# Patient Record
Sex: Female | Born: 1935 | Race: Black or African American | Hispanic: No
Health system: Midwestern US, Community
[De-identification: ages and names within clinical notes are randomized; demographics above are authoritative.]

## PROBLEM LIST (undated history)

## (undated) DIAGNOSIS — Z8673 Personal history of transient ischemic attack (TIA), and cerebral infarction without residual deficits: Secondary | ICD-10-CM

## (undated) DIAGNOSIS — I10 Essential (primary) hypertension: Secondary | ICD-10-CM

## (undated) DIAGNOSIS — N390 Urinary tract infection, site not specified: Secondary | ICD-10-CM

## (undated) DIAGNOSIS — E785 Hyperlipidemia, unspecified: Secondary | ICD-10-CM

## (undated) DIAGNOSIS — M549 Dorsalgia, unspecified: Secondary | ICD-10-CM

## (undated) DIAGNOSIS — E119 Type 2 diabetes mellitus without complications: Secondary | ICD-10-CM

## (undated) DIAGNOSIS — R52 Pain, unspecified: Secondary | ICD-10-CM

## (undated) DIAGNOSIS — G629 Polyneuropathy, unspecified: Secondary | ICD-10-CM

## (undated) DIAGNOSIS — K219 Gastro-esophageal reflux disease without esophagitis: Secondary | ICD-10-CM

## (undated) DIAGNOSIS — R300 Dysuria: Secondary | ICD-10-CM

## (undated) DIAGNOSIS — M48062 Spinal stenosis, lumbar region with neurogenic claudication: Secondary | ICD-10-CM

## (undated) DIAGNOSIS — I872 Venous insufficiency (chronic) (peripheral): Secondary | ICD-10-CM

## (undated) DIAGNOSIS — Z981 Arthrodesis status: Secondary | ICD-10-CM

## (undated) DIAGNOSIS — J849 Interstitial pulmonary disease, unspecified: Secondary | ICD-10-CM

## (undated) DIAGNOSIS — M4310 Spondylolisthesis, site unspecified: Secondary | ICD-10-CM

## (undated) DIAGNOSIS — D72829 Elevated white blood cell count, unspecified: Secondary | ICD-10-CM

## (undated) DIAGNOSIS — Z87440 Personal history of urinary (tract) infections: Secondary | ICD-10-CM

## (undated) DIAGNOSIS — M5104 Intervertebral disc disorders with myelopathy, thoracic region: Secondary | ICD-10-CM

## (undated) DIAGNOSIS — M7060 Trochanteric bursitis, unspecified hip: Secondary | ICD-10-CM

## (undated) DIAGNOSIS — Z9181 History of falling: Secondary | ICD-10-CM

## (undated) DIAGNOSIS — E1121 Type 2 diabetes mellitus with diabetic nephropathy: Principal | ICD-10-CM

## (undated) DIAGNOSIS — N3941 Urge incontinence: Secondary | ICD-10-CM

## (undated) DIAGNOSIS — E1140 Type 2 diabetes mellitus with diabetic neuropathy, unspecified: Principal | ICD-10-CM

## (undated) DIAGNOSIS — N3000 Acute cystitis without hematuria: Secondary | ICD-10-CM

---

## 2009-07-28 NOTE — Telephone Encounter (Signed)
Left message for daughter, Lynden Ang to call back.

## 2009-07-28 NOTE — Telephone Encounter (Signed)
Message copied by Miki Kins on Wed Jul 28, 2009  9:25 AM  ------       Message from: Hyman Hopes       Created: Tue Jul 27, 2009  6:38 PM       Regarding: Ariatna Jester       Wk [phone] til 4:30 p.m. Or [phone]       Caller made appt for this pt w/you on 08/09/09.  This is a new patient appt and only wants a well woman gyn exam w/chol labs.  She is saying the patient knows what she wants, so why should she have to wait.              If you will not do this physical for this new patient on this appt date, call to let her know (physicals are normally not done on new patient appts as those are to establish primary care.)

## 2009-07-29 NOTE — Telephone Encounter (Signed)
Pt appt was changed from 08/09/09 to 08/19/09, to give her more time to have CPE w/pap, along w/getting established as new pt.

## 2009-08-11 NOTE — Telephone Encounter (Addendum)
Agree with recommendation

## 2009-08-19 NOTE — Progress Notes (Signed)
Subjective:     Chelsea Schultz is a 75 y.o. female who presents for follow up of diabetes, hypertension and peripheral vascular disease.    Diet and Lifestyle: not attempting to follow a low fat, low cholesterol diet, not attempting to follow a low sodium diet, does not rigorously follow a diabetic diet, sedentary  Home BP Monitoring: is not measured at home    Cardiovascular ROS: taking medications as instructed, no medication side effects noted, no TIA's, no chest pain on exertion, no dyspnea on exertion, no swelling of ankles.     Diabetes Mellitus:  She has diabetes mellitus, and  hypertension.  Diabetic ROS - medication compliance: compliant all of the time, home glucose monitoring: is performed regularly, last eye exam approximately 6 months ago positive for cataracts bilaterally.       Back Pain    Chelsea Schultz is a 75 y.o. female who also complains of low back pain for several month(s), is positional with bending or lifting, with radiation down the legs.  Severity of pain is 10 out of 10.  numbness, tingling, weakness is present in bilateral  leg foot.  Precipitating factors: none recalled by the patient. Prior history of back problems: previous osteoarthritis of lumbar spine.  Self treatment:  medication not used .    The patient denies fevers, chills or sweats.  The patient denies bowel/bladder incontinence and saddle numbness.        Problem List Date Reviewed: 08/19/2009      Class    HTN (hypertension) [401.9AF]               Past Medical History   Diagnosis Date   ??? Diabetes    ??? Hypertension    ??? Neuropathy           Component Value Date/Time   WBC 9.3 08/19/2009 11:49 AM   HGB 13.0 08/19/2009 11:49 AM   HCT 41.3 08/19/2009 11:49 AM   PLATELET 255 08/19/2009 11:49 AM   MCV 87.3 08/19/2009 11:49 AM         Component Value Date/Time   Creatinine 0.9 08/19/2009 11:49 AM        Component Value Date/Time   ALT 22 08/19/2009 11:49 AM   AST 22 08/19/2009 11:49 AM    Alk. phosphatase 75 08/19/2009 11:49 AM   Bilirubin, total 0.5 08/19/2009 11:49 AM          Component Value Date/Time   Creatinine 0.9 08/19/2009 11:49 AM   BUN 23 08/19/2009 11:49 AM   Sodium 141 08/19/2009 11:49 AM   Potassium 4.3 08/19/2009 11:49 AM   Chloride 103 08/19/2009 11:49 AM   CO2 27 08/19/2009 11:49 AM            Review of Systems, additional:  A comprehensive review of systems was negative except for that written in the HPI.    Objective:     BP 176/79   Pulse 74   Temp(Src) 98.1 ??F (36.7 ??C) (Oral)   Resp 20   Ht 4' 7.75" (1.416 m)   Wt 120 lb 9.6 oz (54.704 kg)   LMP 10/23/1978  Appearance: alert, well appearing, and in no distress.  General exam: CVS exam BP noted to be moderately elevated today in office, S1, S2 normal, no gallop, no murmur, chest clear, no JVD, no HSM, no edema, diabetic exam heart sounds normal rate, regular rhythm, normal S1, S2, no murmurs, rubs, clicks or gallops.  Lab review: orders written for new  lab studies as appropriate; see orders.     Assessment/Plan:     diabetes stable, hypertension needs further observation, needs improvement, hyperlipidemia control uncertain, peripheral vascular disease needs improvement.  current treatment plan is effective, no change in therapy  repeat labs ordered prior to next appointment  reviewed diet, exercise and weight control  recommended sodium restriction  reviewed medications and side effects in detail  specific diabetic recommendations: diabetic diet discussed in detail, written exchange diet given, home glucose monitoring emphasized, foot care discussed and Podiatry visits discussed, annual eye examinations at Ophthalmology discussed and glycohemoglobin and other lab monitoring discussed.     1. HTN (hypertension) (401.9AF)  amlodipine-valsartan (EXFORGE) 10-320 mg per tablet, potassium chloride (K-DUR, KLOR-CON) 20 mEq tablet, CBC WITH AUTOMATED DIFF, METABOLIC PANEL, COMPREHENSIVE    2. Diabetes (250.00Q)  glipiZIDE (GLUCOTROL) 5 mg tablet   3. Back pain (724.5E)  VITAMIN D, 25 HYDROXY, XR DEXA W VERTEBRAL ASSESSMENT, XR SPINE LUMBAR 2 OR 3 VIEWS  Patient to take tylenol prn for pain, until renal function is assessed.  May try NSAID if normal.  Stretching exercises, rest, ice to area for now.  RTC in 3 weeks for follow up.    4. Need for lipid screening (V77.45F)  LIPID PANEL

## 2009-08-19 NOTE — Progress Notes (Signed)
Pt here for well woman exam, refill meds, & fasting labs.

## 2009-08-19 NOTE — Patient Instructions (Signed)
English   Spanish  Learning About Pap Tests  What is a Pap test?  The Pap test (also called a Pap smear) is a screening test for cancer of the cervix, which is the lower part of the uterus that opens into the vagina. The test can help your doctor find early changes in the cells that could lead to cancer.  During the test, the doctor or nurse uses a tool with smooth, curved blades (speculum) to spread the sides of your vagina. This allows your doctor to see inside the vagina and the cervix. He or she uses a cotton swab or brush to collect cell samples from your cervix.  Try to schedule the test when you're not having your period. To get ready for a Pap test, avoid douches, tampons, vaginal medicines, sprays, or powders for at least a day before you have the test.  When should you have a Pap test?  Some experts recommend that you start having Pap tests within 3 years after you become sexually active. Others say that everyone should start getting tested at age 26. Talk with your doctor about when to start having Pap tests.  ?? Women younger than 30 should have Pap tests every 1 to 2 years, depending on what they and their doctors decide.   ?? Women 30 and older who have had three normal Pap tests in a row may only need a Pap test every 3 years. Women can also have an HPV (human papillomavirus) test with their Pap tests. If the Pap and HPV tests are negative, women can have their Pap tests every 3 years. But women who are at a higher risk for cervical cell changes or cervical cancer may still need the tests more often, even if the results are normal.    ?? Women 15 and older who have had three normal Pap tests in a row and no abnormal Pap tests in the past 10 years may decide to stop having this test. When to stop having Pap tests is a personal choice and may depend on your medical history, your overall health, and your risk for cervical cell changes or cervical cancer. Talk with your doctor about whether you should stop or continue to have Pap tests. He or she can help you decide.  Having the HPV vaccine does not change your need for Pap tests. Women who have had the HPV vaccine should follow the same Pap test schedule as women who have not had the vaccine.  What happens after the test?  The sample of cells taken during your test will be sent to a lab so that an expert can look at the cells. It usually takes a week or two to get the results back.  ?? A normal result means that the test did not find any abnormal cells in the sample.   ?? An abnormal result can mean many things. Most of these are not cancer. The results of your test may be abnormal because:   ?? You have an infection of the vagina or cervix, such as a yeast infection.   ?? You have an IUD (intrauterine device for birth control).   ?? You have low estrogen levels after menopause that are causing the cells to change.   ?? You have cell changes that may be a sign of precancer or cancer. The results are ranked based on how serious the changes might be.     Where can you learn more?   Go to  [facility].  Enter P919 in the search box to learn more about "Learning About Pap Tests."    ?? [employer]. Care instructions adapted under license by [employer] (which disclaims liability or warranty for this information). This care instruction is for use with your licensed healthcare professional. If you have questions about a medical condition or this instruction, always ask your healthcare professional. Healthwise disclaims any warranty or liability for your use of this information.English   Spanish  Dual Energy X-Ray Absorptiometry (DEXA) Test: About This Test  What is it?  Dual Energy X-ray Absorptiometry (DEXA) is a test that uses two different X-ray beams to check bone thickness (density) in your spine and hip. This information is used to estimate the strength of your bones.  Why is this test done?  A DEXA test is done to check for bone thinning and weakness, which can make it easier for you to break a bone. It is often done for:  ?? People who are at risk for osteoporosis:   ?? Women over age 59.   ?? People who take some medications, such as corticosteroids.   ?? People who have certain medical conditions, such as hyperparathyroidism.  ?? People who have osteoporosis, to see how well treatment is working.  What happens before the test?  ?? Women who are pregnant should not have this test. Let your doctor know if you are or might be pregnant.   ?? You may be able to leave your clothes on for the test, but you will remove any metal buttons or buckles for the test.  What happens during the test?  ?? You will lie down on your back on a padded table.   ?? You may need to lie with your legs straight or with your lower legs resting on a platform built into the table.   ?? The machine will scan your bones and measure the amount of radiation they absorb. During this test you are exposed to a very low dose of radiation.  How long does the test take?  ?? The test will take about 20 minutes.  What happens after the test?  ?? You will probably be able to go home right away.    ?? You can go back to your usual activities right away.     Where can you learn more?   Go to [facility].  Enter [phone] in the search box to learn more about "Dual Energy X-Ray Absorptiometry (DEXA) Test: About This Test."   ?? 2006-2010 [employer]wise, Incorporated. Care instructions adapted under license by [employer] (which disclaims liability or warranty for this information). This care instruction is for use with your licensed healthcare professional. If you have questions about a medical condition or this instruction, always ask your healthcare professional. Healthwise disclaims any warranty or liability for your use of this information.English   Spanish  High Blood Pressure: After Your Visit  Your Care Instructions  If your blood pressure is usually above 140/90, you have high blood pressure, or hypertension. Despite what a lot of people think, high blood pressure usually does not cause headaches or make you feel dizzy or lightheaded. It usually has no symptoms. But it does increase your risk for heart attack, stroke, and kidney or eye damage. The higher your blood pressure, the more your risk increases.  Changes in your lifestyle, such as staying at a healthy weight, may help you lower your blood pressure. Your treatment also will include medicine. If you stop taking  your medicine, your blood pressure will go back up.  Follow-up care is a key part of your treatment and safety. Be sure to make and go to all appointments, and call your doctor if you are having problems. It???s also a good idea to know your test results and keep a list of the medicines you take.  How can you care for yourself at home?  Medical treatment   ?? Take your medicine exactly as prescribed. You may take one or more types of medicine to lower your blood pressure. They include diuretics, beta-blockers, ACE inhibitors, calcium channel blockers, angiotensin II receptor blockers, and other medicines. Call your doctor if you think you are having a problem with your medicine.   ?? Your doctor may suggest that you take one low-dose aspirin (81 mg) a day. This can help reduce your risk of having a stroke or heart attack.   ?? See your doctor at least 2 times a year. You may need to see the doctor more often at first or until your blood pressure comes down.   ?? If you are taking blood pressure medicine, talk to your doctor before you take decongestants or anti-inflammatory medicine, such as ibuprofen. Some of these medicines can raise blood pressure.   ?? Learn how to check your blood pressure at home.  Lifestyle changes  ?? Stay at a healthy weight. This is especially important if you put on weight around the waist. Losing even 10 pounds can help you lower your blood pressure.   ?? If your doctor recommends it, get more exercise. Walking is a good choice. Bit by bit, increase the amount you walk every day. Try for at least 30 minutes on most days of the week. You also may want to swim, bike, or do other activities.   ?? Avoid or limit alcohol. Talk to your doctor about whether you can drink any alcohol.   ?? Limit salt.   ?? Eat plenty of fruits (such as bananas and oranges), vegetables, legumes, whole grains, and low-fat dairy products.   ?? Lower the amount of saturated fat in your diet. Saturated fat is found in animal products such as milk, cheese, and meat. Limiting these foods may help you lose weight and also lower your risk for heart disease.   ?? Do not smoke. Smoking increases your risk for heart attack and stroke. If you need help quitting, talk to your doctor about stop-smoking programs and medicines. These can increase your chances of quitting for good.   When should you call for help?  Call 911 anytime you think you may need emergency care. For example, call if:  ?? You have chest pain or pressure. This may occur with:   1. Sweating.   2. Shortness of breath.   3. Nausea or vomiting.   4. Pain that spreads from the chest to the neck, jaw, or one or both shoulders or arms.   5. Dizziness or lightheadedness.   6. A fast or uneven pulse.  After calling 911, chew 1 adult-strength aspirin. Wait for an ambulance. Do not try to drive yourself.   ?? You have signs of a stroke. These include:   1. Sudden numbness, paralysis, or weakness in your face, arm, or leg, especially on only one side of your body.   2. New problems with walking or balance.   3. Sudden vision changes.   4. New problems speaking or understanding simple statements, or feeling confused.   5. A sudden,  severe headache that is different from past headaches.  Call your doctor now or seek immediate medical care if:  ?? Your blood pressure rises suddenly.   ?? Your blood pressure is 180/110 or higher.   ?? You are dizzy or lightheaded, or you feel like you may faint.   ?? Your blood pressure is higher than 140/90 on two or more occasions.  Watch closely for changes in your health, and be sure to contact your doctor if:  ?? You do not get better as expected.     Where can you learn more?   Go to [facility].  Enter [phone] in the search box to learn more about "High Blood Pressure: After Your Visit."   ?? 2006-2010 [employer]wise, [employer]. Care instructions adapted under license by [employer] (which disclaims liability or warranty for this information). This care instruction is for use with your licensed healthcare professional. If you have questions about a medical condition or this instruction, always ask your healthcare professional. Healthwise disclaims any warranty or liability for your use of this information.English   Spanish  The DASH Diet: After Your Visit  Your Care Instructions   The DASH diet is an eating plan that can help lower your blood pressure. DASH stands for Dietary Approaches to Stop Hypertension. Hypertension is high blood pressure.  The DASH diet focuses on eating foods that are high in calcium, potassium, and magnesium. These nutrients can lower blood pressure. The foods that are highest in these nutrients are fruits, vegetables, low-fat dairy products, nuts, seeds, and legumes. But taking calcium, potassium, and magnesium supplements instead of eating foods that are high in those nutrients does not have the same effect. The DASH diet also includes whole grains, fish, and poultry.  The DASH diet is one of several lifestyle changes your doctor may recommend to lower your high blood pressure. Your doctor may also want you to decrease the amount of sodium in your diet. Lowering sodium while following the DASH diet can lower blood pressure even further than just the DASH diet alone.  Follow-up care is a key part of your treatment and safety. Be sure to make and go to all appointments, and call your doctor if you are having problems. It???s also a good idea to know your test results and keep a list of the medicines you take.  How can you care for yourself at home?  Following the DASH diet  ?? Eat 4 to 5 servings of fruit each day. A serving is 1 medium-sized piece of fruit, 1/2 cup chopped or canned fruit, 1/4 cup dried fruit, or 4 ounces (1/2 cup) of fruit juice. Choose fruit more often than fruit juice.   ?? Eat 4 to 5 servings of vegetables each day. A serving is 1 cup of lettuce or raw leafy vegetables, 1/2 cup of chopped or cooked vegetables, or 4 ounces (1/2 cup) of vegetable juice. Choose vegetables more often than vegetable juice.   ?? Get 3 servings of low-fat dairy each day. A serving is 8 ounces of milk, 1 cup of yogurt, or 1 1/2 ounces of cheese.    ?? Eat 7 to 8 servings of grains each day. A serving is 1 slice of bread, 1 ounce of dry cereal, or 1/2 cup of cooked rice, pasta, or cooked cereal. Try to choose whole-grain products as much as possible.   ?? Limit lean meat, poultry, and fish to 6 ounces each day. Six ounces is about the size of two decks  of cards.   ?? Eat 4 to 5 servings of nuts, seeds, and legumes (cooked dried beans, lentils, and split peas) each week. A serving is 1/3 cup of nuts, 2 tablespoons of seeds, or 1/2 cup cooked dried beans or peas.  Tips for success  ?? Start small. Do not try to make dramatic changes to your diet all at once. You might feel that you are missing out on your favorite foods and then be more likely to not follow the plan. Make small changes, and stick with them. Once those changes become habit, add a few more changes.   ?? Try some of the following:   ?? Make it a goal to eat a fruit or vegetable at every meal and at snacks. This will make it easy to get the recommended amount of fruits and vegetables each day.   ?? Try yogurt topped with fruit and nuts for a snack or healthy dessert.   ?? Add lettuce, tomato, cucumber, and onion to sandwiches.   ?? Combine a ready-made pizza crust with low-fat mozzarella cheese and lots of vegetable toppings. Try using tomatoes, squash, spinach, broccoli, carrots, cauliflower, and onions.   ?? Have a variety of cut-up vegetables with a low-fat dip as an appetizer instead of chips and dip.   ?? Sprinkle sunflower seeds or chopped almonds over salads. Or try adding chopped walnuts or almonds to cooked vegetables.   ?? Try some vegetarian meals using beans and peas. Add garbanzo or kidney beans to salads. Make burritos and tacos with mashed pinto beans or black beans.     Where can you learn more?   Go to [employer].  Enter [phone] in the search box to learn more about "The DASH Diet: After Your Visit."    ?? 2006-2010 Healthwise, [employer]. Care instructions adapted under license by [employer] (which disclaims liability or warranty for this information). This care instruction is for use with your licensed healthcare professional. If you have questions about a medical condition or this instruction, always ask your healthcare professional. Healthwise disclaims any warranty or liability for your use of this information.

## 2009-08-20 ENCOUNTER — Telehealth

## 2009-08-20 LAB — METABOLIC PANEL, COMPREHENSIVE
A-G Ratio: 1.2 (ref 1.1–2.2)
ALT (SGPT): 22 U/L (ref 12–78)
AST (SGOT): 22 U/L (ref 15–37)
Albumin: 4 g/dL (ref 3.5–5.0)
Alk. phosphatase: 75 U/L (ref 50–136)
Anion gap: 11 mmol/L (ref 5–15)
BUN/Creatinine ratio: 26 — ABNORMAL HIGH (ref 12–20)
BUN: 23 MG/DL — ABNORMAL HIGH (ref 6–20)
Bilirubin, total: 0.5 MG/DL (ref 0.2–1.0)
CO2: 27 MMOL/L (ref 21–32)
Calcium: 9.7 MG/DL (ref 8.5–10.1)
Chloride: 103 MMOL/L (ref 97–108)
Creatinine: 0.9 MG/DL (ref 0.6–1.3)
GFR est AA: >60 mL/min/{1.73_m2} (ref >60)
GFR est non-AA: >60 mL/min/{1.73_m2} (ref >60)
Globulin: 3.4 g/dL (ref 2.0–4.0)
Glucose: 87 MG/DL (ref 65–100)
Potassium: 4.3 MMOL/L (ref 3.5–5.1)
Protein, total: 7.4 g/dL (ref 6.4–8.2)
Sodium: 141 MMOL/L (ref 136–145)

## 2009-08-20 LAB — CBC WITH AUTOMATED DIFF
ABS. BASOPHILS: 0 10*3/uL (ref 0.0–0.1)
ABS. EOSINOPHILS: 0.2 10*3/uL (ref 0.0–0.4)
ABS. LYMPHOCYTES: 2.5 10*3/uL (ref 0.8–3.5)
ABS. MONOCYTES: 0.9 10*3/uL (ref 0.0–1.0)
ABS. NEUTROPHILS: 5.6 10*3/uL (ref 1.8–8.0)
BASOPHILS: 0 % (ref 0–1)
EOSINOPHILS: 2 % (ref 0–7)
HCT: 41.3 % (ref 35.0–47.0)
HGB: 13 g/dL (ref 11.5–16.0)
LYMPHOCYTES: 27 % (ref 12–49)
MCH: 27.5 PG (ref 26.0–34.0)
MCHC: 31.5 g/dL (ref 30.0–36.5)
MCV: 87.3 FL (ref 80.0–99.0)
MONOCYTES: 10 % (ref 5–13)
NEUTROPHILS: 61 % (ref 32–75)
PLATELET: 255 10*3/uL (ref 150–400)
RBC: 4.73 M/uL (ref 3.80–5.20)
RDW: 14.2 % (ref 11.5–14.5)
WBC: 9.3 10*3/uL (ref 3.6–11.0)

## 2009-08-20 NOTE — Telephone Encounter (Signed)
Message copied by Miki Kins on Fri Aug 20, 2009  1:48 PM  ------       Message from: Anner Crete A       Created: Fri Aug 20, 2009  9:59 AM       Regarding: Penha call pt       Contact: [phone]         Pt's daughter is calling with information you requested.  Daughter can be reached at  [phone] Ext  [phone].   She is calling with the following information:              Furosemide 40mg  2x day.              Need name of vitamin that would help pt with her neuropathy.                 Please call in all prescription and the Furosemide also.

## 2009-08-21 LAB — VITAMIN D, 25 HYDROXY: Vitamin D 25-Hydroxy: 36 ng/mL (ref 30–80)

## 2009-08-24 NOTE — Progress Notes (Addendum)
I reviewed with the resident the medical history and the resident's findings on the physical examination.  I discussed with the resident the patient's diagnosis and concur with the plan.

## 2009-08-25 ENCOUNTER — Encounter

## 2009-08-25 MED ORDER — GLIPIZIDE 5 MG TAB
5 mg | ORAL_TABLET | Freq: Two times a day (BID) | ORAL | Status: DC
Start: 2009-08-25 — End: 2009-10-25

## 2009-08-25 MED ORDER — POTASSIUM CHLORIDE SR 20 MEQ TAB, PARTICLES/CRYSTALS
20 mEq | ORAL_TABLET | Freq: Every day | ORAL | Status: DC
Start: 2009-08-25 — End: 2010-09-21

## 2009-08-25 MED ORDER — FUROSEMIDE 40 MG TAB
40 mg | ORAL_TABLET | Freq: Two times a day (BID) | ORAL | Status: AC
Start: 2009-08-25 — End: 2010-08-20

## 2009-08-25 MED ORDER — ALPHA LIPOIC ACID 600 MG CAPSULE
600 mg | ORAL_TABLET | Freq: Every day | ORAL | Status: DC
Start: 2009-08-25 — End: 2010-07-01

## 2009-08-25 MED ORDER — AMLODIPINE-VALSARTAN 10 MG-320 MG TAB
10-320 mg | ORAL_TABLET | Freq: Every day | ORAL | Status: DC
Start: 2009-08-25 — End: 2010-09-21

## 2009-08-25 NOTE — Telephone Encounter (Signed)
meds were electronically ordered on day of visit.  Don't know why there was a problem.  Vitamin was Lipoic acid 600 mg daily.  Will send results and information regarding use with peripheral neuropathy.

## 2009-08-25 NOTE — Progress Notes (Signed)
Quick Note:    Called in to give results and to discuss medications. No answer at 1:35pm on 08/25/09. Information and results mailed to patient today. All scripts were reforwarded to [facility] pharmacy. Thank you. Please give pts daughter information if she calls back.  ______

## 2009-08-25 NOTE — Telephone Encounter (Signed)
Message copied by Miki Kins on Wed Aug 25, 2009  9:38 AM  ------       Message from: Hyman Hopes       Created: Tue Aug 24, 2009  5:25 PM       Regarding: Chelsea Schultz       Contact: [phone]         Pt was seen last week and her medications have not been filled.  Since the appt, Lynden Ang has also called with a message to you regarding the other medication the patient needed.  Also the natural o.t.c. vitamin to help with perifil neuropathy needs to be discussed.       [facility], [place],       .       Daughter, Tora Prunty, says she wants this matter of the patient medications resolved as soon as possible and is expecting a call as soon as possible.  She does not understand what you are doing and why this was not taken care of.

## 2009-09-02 MED ORDER — CALCIUM CARBONATE-VITAMIN D2 500 MG-125 UNIT TAB
500-125 mg-unit | ORAL_TABLET | Freq: Two times a day (BID) | ORAL | Status: AC
Start: 2009-09-02 — End: 2009-10-02

## 2009-09-02 MED ORDER — DICLOFENAC 3 % TOPICAL GEL
3 % | Freq: Two times a day (BID) | CUTANEOUS | Status: DC
Start: 2009-09-02 — End: 2010-07-01

## 2009-09-02 NOTE — Progress Notes (Signed)
Pt here for f/u hip pain and Bp check.

## 2009-09-02 NOTE — Patient Instructions (Signed)
English   Spanish  Back Pain: After Your Visit  Your Care Instructions    Back pain has many possible causes. It is often related to problems with muscles and ligaments of the back. It may also be related to problems with the nerves, discs, or bones of the back. Moving, lifting, standing, sitting, or sleeping in an awkward way can strain the back. Sometimes you do not notice the injury until later. Arthritis is another common cause of back pain.  Although it may hurt a lot, back pain usually improves on its own within several weeks. Most people recover in 12 weeks or less. Using good home treatment and being careful not to stress your back can help you feel better sooner.  Follow-up care is a key part of your treatment and safety. Be sure to make and go to all appointments, and call your doctor if you are having problems. It???s also a good idea to know your test results and keep a list of the medicines you take.  How can you care for yourself at home?  ?? Sit or lie in positions that are most comfortable and reduce your pain. Try one of these positions when you lie down:   ?? Lie on your back with your knees bent and supported by large pillows.   ?? Lie on the floor with your legs on the seat of a sofa or chair.   ?? Lie on your side with your knees and hips bent and a pillow between your legs.   ?? Lie on your stomach if it does not make pain worse.  ?? Do not sit up in bed, and avoid soft couches and twisted positions. Bed rest can help relieve pain at first, but it delays healing. Avoid bed rest after the first day.   ?? Change positions every 30 minutes. If you must sit for long periods of time, take breaks from sitting. Get up and walk around, or lie in a comfortable position.   ?? For the first 2 or 3 days, put ice or cold packs on your back for 10 to 20 minutes at a time, several times a day. (Put a thin cloth between the ice pack and your skin.) This reduces pain.    ?? After the first 2 or 3 days, use a warm pack or heating pad for 20 minutes at a time. Hot showers will also help. Hot baths may help as long as you can lie or sit in a position that does not stress your back. You may also keep using ice if it helps.   ?? Take pain medicines exactly as directed.   ?? If the doctor gave you a prescription medicine for pain, take it as prescribed.   ?? If you are not taking a prescription pain medicine, ask your doctor if you can take an over-the-counter medicine.   ?? Do not take two or more pain medicines at the same time unless the doctor told you to. Many pain medicines have acetaminophen, which is Tylenol. Too much acetaminophen (Tylenol) can be harmful.  ?? Take short walks several times a day. You can start with 5 to 10 minutes, 3 to 4 times a day, and work up to longer walks. Stick to level surfaces and avoid hills and stairs until your back is better.   ?? Return to work and other activities as soon as you can. Continued rest without activity is usually not good for your back.   ?? To   prevent future back pain, do exercises to stretch and strengthen your back and stomach. Learn how to use good posture, safe lifting techniques, and proper body mechanics.  When should you call for help?  Call 911 anytime you think you may need emergency care. For example, call if:  ?? You lose bladder or bowel control.   ?? You suddenly cannot walk or stand.   ?? You have sudden numbness or weakness in both legs.  Call your doctor now or seek immediate medical care if:  1. You have new pain, numbness, tingling, or weakness, especially in the buttocks, genital or rectal area, legs, or feet.   2. You have symptoms of a urinary infection. For example:   1. You have blood or pus in your urine.   2. You have pain in your back just below your rib cage. This is called flank pain.   3. You have a fever, chills, or body aches.   4. It hurts to urinate. You have groin or belly pain.   Watch closely for changes in your health, and be sure to contact your doctor if:  1. Your back pain gets worse or more frequent.   2. Your back pain is not better after 1 week of home treatment. It may take a lot longer for the pain to go away completely, but it should feel at least a little better.     Where can you learn more?   Go to [email or web address].  Enter [phone] in the search box to learn more about "Back Pain: After Your Visit."   ?? 2006-2010 [employer]. Care instructions adapted under license by [employer] (which disclaims liability or warranty for this information). This care instruction is for use with your licensed healthcare professional. If you have questions about a medical condition or this instruction, always ask your healthcare professional. [employer]wise disclaims any warranty or liability for your use of this information.English   Spanish  Back Stretches: Exercises  Your Care Instructions  Here are some examples of exercises for stretching your back. Start each exercise slowly. Ease off the exercise if you start to have pain.  Your doctor or physical therapist will tell you when you can start these exercises and which ones will work best for you.  How to do the exercises  Overhead stretch      3. Stand comfortably with your feet shoulder-width apart.   4. Looking straight ahead, raise both arms over your head and reach toward the ceiling. Do not allow your head to tilt back.   5. Hold for 15 to 30 seconds, then lower your arms to your sides.   6. Repeat 2 to 4 times.  Side stretch      3. Stand comfortably with your feet shoulder-width apart.   4. Raise one arm over your head, and then lean to the other side.   5. Slide your hand down your leg as you let the weight of your arm gently stretch your side muscles. Hold for 15 to 30 seconds.   6. Repeat 2 to 4 times on each side.  Press-up      1. Lie on your stomach, supporting your body with your forearms.    2. Press your elbows down into the floor to raise your upper back. As you do this, relax your stomach muscles and allow your back to arch without using your back muscles. As your press up, do not let your hips or pelvis come off the floor.  3. Hold for 15 to 30 seconds, then relax.   4. Repeat 2 to 4 times.  Relax and rest      1. Lie on your back with a rolled towel under your neck and a pillow under your knees. Extend your arms comfortably to your sides.   2. Relax and breathe normally.   3. Remain in this position for about 10 minutes.   4. If you can, do this 2 or 3 times each day.  Follow-up care is a key part of your treatment and safety. Be sure to make and go to all appointments, and call your doctor if you are having problems. It's also a good idea to know your test results and keep a list of the medicines you take.     Where can you learn more?   Go to [email or web address].  Enter [phone] in the search box to learn more about "Back Stretches: Exercises."   ?? [employer]. Care instructions adapted under license by [employer] (which disclaims liability or warranty for this information). This care instruction is for use with your licensed healthcare professional. If you have questions about a medical condition or this instruction, always ask your healthcare professional. [employer]wise disclaims any warranty or liability for your use of this information.

## 2009-09-05 NOTE — Progress Notes (Signed)
Subjective:    Chelsea Schultz is a 75 y.o. female who presents for follow up of hypertension.   Diet and Lifestyle: not attempting to follow a low fat, low cholesterol diet, not attempting to follow a low sodium diet, does not rigorously follow a diabetic diet, sedentary   Home BP Monitoring: is not measured at home.       Cardiovascular ROS: taking medications as instructed, no medication side effects noted, no TIA's, no chest pain on exertion, no dyspnea on exertion, no swelling of ankles.     Component Value Date/Time   Creatinine 0.9 08/19/2009 11:49 AM   BUN 23 08/19/2009 11:49 AM   Sodium 141 08/19/2009 11:49 AM   Potassium 4.3 08/19/2009 11:49 AM   Chloride 103 08/19/2009 11:49 AM   CO2 27 08/19/2009 11:49 AM          Back Pain   Chelsea Schultz is a 75 y.o. female who also complains of low back pain for several month(s), is positional with bending or lifting, with radiation down the legs. Severity of pain is 10 out of 10. numbness, tingling, weakness is present in bilateral leg foot. Precipitating factors: none recalled by the patient. Prior history of back problems: previous osteoarthritis of lumbar spine. Self treatment: medication not used .   The patient denies fevers, chills or sweats. The patient denies bowel/bladder incontinence and saddle numbness.   Pt usually takes tylenol prn for the pain.  She has difficulty lying flat on her back because of the pain.  She also has limitations in activities because of the pain.      Review of Systems, additional:   A comprehensive review of systems was negative except for that written in the HPI.     Objective:    Blood pressure 143/79, pulse 71, temperature 97.9 ??F (36.6 ??C), temperature source Oral, resp. rate 16, height 4' 7.5" (1.41 m), weight 120 lb 6.4 oz (54.613 kg), last menstrual period 10/23/1978.  Appearance: alert, well appearing, and in no distress.    General exam: CVS exam BP noted to be moderately elevated today in office, S1, S2 normal, no gallop, no murmur, chest clear, no JVD, no HSM, no edema, diabetic exam heart sounds normal rate, regular rhythm, normal S1, S2, no murmurs, rubs, clicks or gallops.     Patient appears to be in moderate pain, antalgic gait noted. Lumbosacral spine area reveals local tenderness no mass. Painful LS ROM noted. Spinal alignment is normal.  Straight leg raise is positive at 30 degrees on both sides. Lumbosacral distribution DTR's, motor strength and sensation are normal, including heel and toe gait.  Peripheral pulses are palpable and 2 plus. Prior studies inlcude: Lumbar spine X-Ray: shows DJD changes, likely chronic.      Assessment/Plan:    diabetes stable, hypertension needs further observation, needs improvement, hyperlipidemia control uncertain, peripheral vascular disease needs improvement.   current treatment plan is effective, no change in therapy   repeat labs ordered prior to next appointment   reviewed diet, exercise and weight control   recommended sodium restriction   reviewed medications and side effects in detail   specific diabetic recommendations: diabetic diet discussed in detail, written exchange diet given, home glucose monitoring emphasized, foot care discussed and Podiatry visits discussed, annual eye examinations at Ophthalmology discussed and glycohemoglobin and other lab monitoring discussed.   1.  HTN (hypertension) (401.9AF)  Continue amlodipine-valsartan (EXFORGE) 10-320 mg per tablet,   potassium chloride (K-DUR, KLOR-CON) 20 mEq tablet  2.  Degenerative disc disease, lumbar (722.52U)  XR DEXA W VERTEBRAL ASSESSMENTdiclofenac sodium (SOLARAZE) 3 % topical gel, to back area tid  REFERRAL TO [facility], Dr. Sonia Side  calcium-vitamin D [facility]) [phone] mg-unit tablet   Renal function is normal May try NSAID if normal. Stretching exercises, rest, ice to area for now. RTC in 3 weeks for follow up.

## 2009-09-07 NOTE — Progress Notes (Addendum)
I reviewed the patient's medical history, the resident's findings on physical examination, the patient's diagnoses, and treatment plan as documented in the resident note.  I concur with the treatment plan as documented.  Additional suggestions noted.  I would recommend keeping a close eye on her potassium with her on the ARB and taking potassium supplement.

## 2009-09-16 NOTE — Telephone Encounter (Signed)
Med refill

## 2009-09-30 NOTE — Telephone Encounter (Signed)
Called patient to see if she was feeling better.  She is still feeling low back pain with radiculitis.  She was advised by Dr. Ann Lions who recommends surgery for evaluation and possible treatment.   Pt will follow up with me after total evaluation.

## 2009-10-25 MED ORDER — GLIPIZIDE 5 MG TAB
5 mg | ORAL_TABLET | Freq: Two times a day (BID) | ORAL | Status: DC
Start: 2009-10-25 — End: 2009-11-19

## 2009-10-25 MED ORDER — HYDROCODONE-ACETAMINOPHEN 5 MG-500 MG TAB
5-500 mg | ORAL_TABLET | ORAL | Status: DC | PRN
Start: 2009-10-25 — End: 2010-02-28

## 2009-10-25 MED ORDER — PREGABALIN 75 MG CAP
75 mg | ORAL_CAPSULE | Freq: Two times a day (BID) | ORAL | Status: DC
Start: 2009-10-25 — End: 2010-02-28

## 2009-10-25 NOTE — Progress Notes (Signed)
Presents for f/u htn and back pain.

## 2009-10-25 NOTE — Patient Instructions (Signed)
English   Spanish  Neuropathic Pain: After Your Visit  Your Care Instructions  Neuropathic pain, which is also called nerve pain, results from pressure on or damage to nerves in the body. Medical problems such as diabetes or shingles or an injury to a nerve itself can cause this type of pain. Nerve pain is most often a sharp, burning, or stabbing pain, but some people may feel it as a dull ache. It may come and go, or it may be constant.  Your pain is a real condition. It is not just in your head. Treatment can help and may include pain medicines, exercise, and physical therapy. Neuropathic pain alters nerve signals, often making your skin very sensitive. Touch, pressure, and other sensations that did not used to hurt may become painful. Medicines such as antidepressants and anticonvulsants can help decrease the number of pain signals that travel over the nerves and make the painful areas less sensitive. These medicines can also help you sleep better and improve your mood.  Pain can affect many parts of your life???your mood, sleep, work, hobbies, home life, and relationships with friends and family. Taking an active role in your treatment will help you better manage your pain. Your doctor may recommend cognitive-behavioral therapy and stress management to help you learn how to cope better with pain. It may also help to make changes in your daily life, such as quitting smoking, lowering your blood pressure, and, if you have diabetes, keeping your blood sugar in your target blood sugar range. Using just one method of treatment does not work well enough for most people. Pain medicines are very important but are only one part of successful treatment.  If you feel that your treatment is not working, talk to your doctor so that you can work together to try something else. Also talk to your doctor if you feel depressed or anxious. There are treatments that can help.   Follow-up care is a key part of your treatment and safety. Be sure to make and go to all appointments, and call your doctor if you are having problems. It???s also a good idea to know your test results and keep a list of the medicines you take.  How can you care for yourself at home?  ?? Pace yourself. Break up large jobs into smaller tasks. Save harder tasks for days when you have less pain, or go back and forth between hard tasks and easier ones. Take rest breaks.   ?? Relax, and reduce stress. Relaxation techniques such as deep breathing or meditation can help.   ?? Keep moving. Gentle, daily exercise can help reduce pain over the long run. Try low- or no-impact exercises such as walking, swimming, and stationary biking. Do stretches and range-of-motion exercises to stay strong and flexible. Talk with your doctor or physical therapist about what exercises are best for you.   ?? Try heat, cold packs, and massage.   ?? Get enough sleep. Chronic pain can make you tired and drain your energy. Talk with your doctor if you have trouble sleeping because of pain.   ?? Think positive. Your thoughts can affect your pain level. Do things that you enjoy to distract yourself when you have pain instead of focusing on the pain. See a movie, read a book, listen to music, or spend time with a friend.   ?? Keep a daily pain diary. Record how intense the pain is and what kind of pain you have. Track how your moods,  thoughts, sleep patterns, activities, and medicine affect your pain. Having a record of your pain can help you and your doctor find the best ways to treat your pain.   ?? Take your pain medicines exactly as directed. This is very important in keeping your pain under control. Here are a few other tips on taking your medicine:   ?? If you take pain medicine on a regular schedule, take it at the right time and in the right amount.    ?? If you take pain medicine ???as needed,??? take it at the first sign of pain. Do not wait until the pain gets bad.   ?? Some people who take pain medicine worry about getting addicted. Addiction to pain medicine is rare if you have not had a problem with addiction in the past and you take your medicine as directed by your doctor. Most of the medicines used for neuropathic pain do not cause addiction.   ?? Do not increase or decrease your pain medicine without talking to your doctor first.   ?? Call your doctor if you think you are having a problem with your medicine. If you have been taking pain medicine for a long time, stopping it suddenly can cause bad side effects.   ?? Never take your pain medicine with alcohol or other drugs.   ?? Tell your doctor about any other medicines you are taking, including over-the-counter drugs, herbal and dietary supplements.  Reducing constipation caused by pain medicine  Pain medicines often cause constipation, which makes stools hard, dry, and difficult to pass. To reduce constipation:  ?? Drink plenty of fluids, enough so that your urine is mostly clear. If you have kidney, heart, or liver disease and have to limit fluids, talk with your doctor before you increase the amount of fluids you drink.   ?? Include high-fiber foods, such as fruits, vegetables, beans, and whole grains, in your diet each day.   ?? Walk for 15 to 20 minutes 2 times a day. If walking is not an option, ask your doctor about other ways to be active.   ?? Limit foods that make constipation worse, such as milk, ice cream, white rice, cheese, bananas, applesauce, chocolate, and cooked carrots.   ?? Ask your doctor about whether to take a laxative. The goal is to have one easy bowel movement every 1 to 2 days. Do not let constipation go untreated for longer than 3 days.  When should you call for help?  Call your doctor now or seek immediate medical care if:   ?? You are feeling down or blue, or you are not enjoying things like you once did. You may be depressed, which is common in people who have chronic pain. Depression can be treated.   ?? You have trouble with bowel movements, such as:   ?? No bowel movement in 3 days.   ?? Blood in the anal area, in your stool, or on the toilet paper.   ?? Diarrhea for more than 24 hours.  Watch closely for changes in your health, and be sure to contact your doctor if:  ?? Your pain is getting worse.   ?? You cannot sleep because of pain.   ?? You are very worried or anxious about your pain.   ?? You have trouble taking your pain medicine.   ?? You have any concerns about your pain medicine or its side effects.   ?? You have vomiting or cramps for more than 2  hours.     Where can you learn more?   Go to [email or web address].  Enter [phone] in the search box to learn more about "Neuropathic Pain: After Your Visit."   ?? 2006-2010 [employer]. Care instructions adapted under license by [employer] (which disclaims liability or warranty for this information). This care instruction is for use with your licensed healthcare professional. If you have questions about a medical condition or this instruction, always ask your healthcare professional. [employer]wise disclaims any warranty or liability for your use of this information.English   Spanish  Complex Regional Pain Syndrome: After Your Visit  Your Care Instructions  You may be recovering from an injured hand or other limb when the burning pain begins. For others, the pain begins without a clear cause. This is called complex regional pain syndrome or reflex sympathetic dystrophy. It is a flare-up of certain nerves that overreact to something. Doctors often do not know why it happens.   For some people, the pain, swelling, and stiffness can be much worse than the original injury. This pain can last a long time???months or even years. Although it is an unusual condition, it is very real. You may feel better on some days than on others. Your skin may become very sensitive, and it may look blotchy or shiny. You may have trouble moving the limb.  This condition sometimes goes away after some months. More likely, you will need a treatment plan to reduce symptoms, stop the pain from moving to different parts of your body, and prevent permanent damage. You may have to try several treatments before you find what works best for you. In some cases, shots into the nerve area (nerve blocks) seem to reduce symptoms. Pain medicine is only one part of successful treatment. Physical therapy and counseling can also help.  Follow-up care is a key part of your treatment and safety. Be sure to make and go to all appointments, and call your doctor if you are having problems. It???s also a good idea to know your test results and keep a list of the medicines you take.  How can you care for yourself at home?  ?? Try to relax and reduce stress. Relaxation techniques such as deep breathing or meditation can help.   ?? Keep moving, if you can. Gentle, daily exercise, such as walking or swimming, can help reduce pain over the long term.   ?? Put ice or a cold pack on the area for 10 to 20 minutes at a time. Put a thin cloth between the ice and your skin.   ?? Apply a heating pad set on low or a warm cloth to the painful area.   ?? Gently massage the painful area.   ?? Get enough sleep. Talk with your doctor if you have trouble sleeping because of pain.   ?? Take your medicines exactly as prescribed.   ?? Your doctor may have prescribed medicines used to treat depression and seizures. These medicines can reduce your pain, help you sleep better, and improve your mood.    ?? If you are not taking a prescription medicine, ask your doctor if you can take an over-the-counter medicine.   ?? Do not take two or more pain medicines at the same time unless the doctor told you to. Many pain medicines have acetaminophen, which is Tylenol. Too much acetaminophen (Tylenol) can be harmful.  ?? Keep a daily pain diary. Record how your moods, thoughts, sleep patterns, activities, and medicine affect your  pain. Having a record can help you and your doctor find the best ways to treat your pain.   ?? Try to think positive. Remember that your thoughts can affect your pain level. Do things you enjoy to make yourself feel better when you have pain. See a movie, read a book, listen to music, or spend time with a friend. Let your doctor know if you are feeling depressed or anxious.  When should you call for help?  Call your doctor now or seek immediate medical care if:  ?? Your pain is getting worse or is out of control.  Watch closely for changes in your health, and be sure to contact your doctor if:  ?? You cannot sleep because of your pain and stiffness.   ?? You are feeling down or blue, or you are not enjoying things like you once did.     Where can you learn more?   Go to [facility].  Enter [phone] in the search box to learn more about "Complex Regional Pain Syndrome: After Your Visit."   ?? 2006-2010 [employer]. Care instructions adapted under license by [employer] (which disclaims liability or warranty for this information). This care instruction is for use with your licensed healthcare professional. If you have questions about a medical condition or this instruction, always ask your healthcare professional. [employer]wise disclaims any warranty or liability for your use of this information.

## 2009-10-25 NOTE — Progress Notes (Signed)
Subjective:    Chelsea Schultz is a 76 y.o. female who presents for follow up of back pain     Back Pain   Chelsea Schultz is a 76 y.o. female who also complains of low back pain for several month(s), is positional with bending or lifting, with radiation down the legs. Severity of pain is 10 out of 10. numbness, tingling, weakness is present in bilateral legs to her feet. Precipitating factors: none recalled by the patient. Prior history of back problems: previous osteoarthritis of lumbar spine. Self treatment: medication not used .   The patient denies fevers, chills or sweats. The patient denies bowel/bladder incontinence and saddle numbness. Pt has been taking diclofenac and alpha lipoic acid for the neuropathic pain. She still continues to have neuropathic pain.  She has difficulty lying flat on her back because of the pain. She also has limitations in activities because of the pain.  She is having increasing difficulty walking and standing for prolonged periods of time.  Her daughter states that she is occasionally unstable in her gait and is a potential risk for falls.    Most recent MRI of her spine shows extensive lumbar degeneration.  She has seen a Insurance account manager, as well as a Retail buyer.  The consensus is that she is not ready for surgery, but she is to stop Chiropractic manipulation and is to go to a Physical Therapist specialized in Spinal manipulation for people with spinal injury.        ExaminationDelray Alt WO CON - [id number] - Sep 29 2009 7:33AM    FINDINGS:    There is mild reversal of lordosis at C3-C4. Vertebral body heights are   maintained. Marrow signal is normal. The craniocervical junction is intact.   The course, caliber, and signal intensity of the spinal cord are normal.     C2/3: The spinal canal and neuroforamina are widely patent.    C3/4: Posterior disc osteophyte complex results in mild spinal canal   stenosis. Uncovertebral spurring and facet hypertrophy result in moderate    left and mild right foraminal stenosis.    C4/5: Posterior disc osteophyte complex results in mild to moderate spinal   canal stenosis. Uncovertebral spurring and facet hypertrophy result in at   least moderate bilateral foraminal stenosis.    C5/6: Asymmetric right posterior disc osteophyte complex causes   mild-moderate spinal canal and severe right foraminal stenosis. There is   mild left foraminal stenosis.    C6/7: Posterior disc osteophyte complex results in mild spinal canal   stenosis. Uncovertebral spurring and facet hypertrophy result in mild   right foraminal stenosis.    C7/T1: Uncovertebral spurring and facet hypertrophy to mild posterior disc   osteophyte complex to cause at least moderate right foraminal stenosis. The   spinal canal and foramina are patent.      IMPRESSION:  Multilevel degenerative disc disease throughout the cervical spine as   discussed in detail above.     ExaminationJarold Motto WO CON - [id number] - Sep 29 2009 7:33AM    FINDINGS:    There is 4 mm anterolisthesis at L4-L5. There is 2 mm anterolisthesis at   L3-L4. There is 2 mm retrolisthesis of L1-L2. Vertebral body heights are   maintained. There are reactive endplate changes at T12-L1. There is mild   superior endplate deformity of L5 although there is no marrow edema. There   is edema and fluid within the bilateral L4-L5 facets. The conus medullaris  terminates at L2. Refer to separate thoracic MRI regarding details on the   appearance of the spinal cord at T11-T12.    L1/2: Diffuse disc bulge, ligamentum flava thickening, and facet   hypertrophy result in mild spinal canal stenosis. There is mild right   foraminal stenosis.    L2/3: There is mild disc bulge and facet hypertrophy without significant   spinal canal or foraminal stenosis.    L3/4: Diffuse disc bulge, ligamentum flava thickening, and facet   hypertrophy result in moderate spinal canal stenosis. There is mild   bilateral foraminal stenosis.     L4/5: There is severe spinal canal stenosis due to the combination of disc   bulge, facet hypertrophy and ligamentum flava thickening. There is   redundancy of the cauda equina above this level. There is moderate bilateral   foraminal stenosis.     L5/S1: There is diffuse disc bulge, facet hypertrophy and ligamentum flavum   thickening resulting in mild spinal canal and mild-moderate bilateral   foraminal stenosis.      IMPRESSION:  1. Multilevel degenerative disc disease most significant at L4-L5 where   grade 1 anterolisthesis combines with diffuse disc bulge, facet hypertrophy   and ligamentum flavum thickening to cause severe spinal canal and moderate   bilateral foraminal stenosis. Associated redundancy of the cauda equina due   to the degree of stenosis. Degenerative/stress related edema in the   bilateral L4-L5 facets.  2. Moderate spinal canal stenosis at L3-L4 due to disc bulge and facet   hypertrophy.  3. Refer to separate thoracic MRI report regarding details on T11-T12   stenosis and cord deformity.    ExaminationDrinda Butts WO CON - [id number] - Sep 29 2009 7:33A    FINDINGS:    There is minimal retrolisthesis at T12-L1. Vertebral body heights are   maintained. There are reactive endplate changes at T12-L1 and T1-T2. There   is severe spinal canal stenosis at T11-T12 due to combination of diffuse   disc bulge and ligamentum flavum thickening, mildly deforming the spinal   cord. There is mild intramedullary signal abnormality at this level.   Remainder of the thoracic spinal cord is normal in appearance. There are   mild degrees of spinal canal stenosis due to disc bulges and ligamentum   flava thickening from T7-T8 through T10-T11. There is also mild spinal canal   stenosis at T1-T2 and T2-T3 due to diffuse disc bulges. There is no   significant foraminal stenosis.       IMPRESSION:  Severe spinal canal stenosis at T11-T12 with deformity of the spinal cord    and mild intramedullary signal abnormality, due to the combination of disc   bulge and ligamentum flavum thickening. Milder degenerative changes at other   levels as discussed above.    Review of Systems, additional:   A comprehensive review of systems was negative except for that written in the HPI.     Objective:    Blood pressure 145/61, pulse 74, temperature 97.6 ??F (36.4 ??C), temperature source Oral, resp. rate 16, height 4' 7.5" (1.41 m), weight 121 lb 9.6 oz (55.157 kg), last menstrual period 10/23/1978.    Appearance: alert, well appearing, and in no distress.   General exam: CVS exam BP noted to be moderately elevated today in office, S1, S2 normal, no gallop, no murmur, chest clear, no JVD, no HSM, no edema, diabetic exam heart sounds normal rate, regular rhythm, normal S1, S2, no murmurs, rubs, clicks or gallops.  Patient appears to be in moderate pain, antalgic gait noted. Lumbosacral spine area reveals local tenderness no mass. Painful LS ROM noted. Spinal alignment is normal. Straight leg raise is positive at 30 degrees on both sides. Lumbosacral distribution DTR's, motor strength and sensation are normal, including heel and toe gait. Peripheral pulses are palpable and 2 plus. Prior studies inlcude: Lumbar spine X-Ray: shows DJD changes, likely chronic.     Assessment/Plan:    diabetes stable, hypertension needs further observation, needs improvement    1. Back pain with radiation (724.5AE)  REFERRAL TO PHYSICIAL MEDICINE REHAB with Dr. Lyman Bishop at [facility] for evaluation and Therapy  hydrocodone-acetaminophen (LORTAB) 5-500 mg per tablet, pregabalin (LYRICA) 75 mg capsule  Continue alpha lipoic acid 600mg  PO daily     2. Diabetes (250.00Q)  glipiZIDE (GLUCOTROL) 5 mg tablet   3. HTN (hypertension) (401.9AF)  Continue Amlodipine/Valsartan 10/320mg  PO daily

## 2009-10-27 NOTE — Progress Notes (Addendum)
I reviewed with the resident the medical history and the resident's findings on the physical examination.  I discussed with the resident the patient's diagnosis and concur with the plan.

## 2009-11-04 NOTE — Telephone Encounter (Signed)
Called into [facility] @ [phone], Glipizide 5 mg tab-Take one tab po in the morning & 1/2 tab @ supper # 60 w/11 Refills, Lortab 5-500 mg tab-Take one tab po every 4 hrs as needed for pain #30 w/3 Refills, and Lyrica 75 mg cap-Take one cap po two times a day #60 w/10 Refills, per Dr. Lily Lovings, because these meds didn't go to [facility] electronically.  Pt's daughter, Olegario Messier informed.

## 2009-11-04 NOTE — Telephone Encounter (Signed)
Message copied by Miki Kins on Thu Nov 04, 2009  5:12 PM  ------       Message from: MCGEE, CONSTANCE H       Created: Thu Nov 04, 2009 12:58 PM       Regarding: Chelsea, Schultz, [phone]       Pt saw you on Oct 25, 2009, medications were not called into [place], [place] location. [phone]              Glypicide requested instead of glucatrol, generic.       Loratob       Hydrocodone       pregavoline lyrica              As of yesterday none of the medications were ordered at the Pharmacy.  Daughter wanted all of these medications written in this message because as of 1 1/2 weeks this has not been done and they probably are not written in the office note either.              She wants this message addressed today.

## 2009-11-06 NOTE — Telephone Encounter (Addendum)
Agree with recommendation from last visit. Scripts did not go electronically

## 2010-02-28 MED ORDER — CALCIUM CARBONATE-VITAMIN D3 600 MG-400 UNIT TAB
600 mg-10 mcg (400 unit) | ORAL_TABLET | Freq: Every day | ORAL | Status: AC
Start: 2010-02-28 — End: ?

## 2010-02-28 MED ORDER — HYDROCODONE-ACETAMINOPHEN 5 MG-500 MG TAB
5-500 mg | ORAL_TABLET | ORAL | Status: DC | PRN
Start: 2010-02-28 — End: 2010-09-21

## 2010-02-28 MED ORDER — PREGABALIN 100 MG CAP
100 mg | ORAL_CAPSULE | Freq: Every evening | ORAL | Status: DC
Start: 2010-02-28 — End: 2010-09-21

## 2010-02-28 MED ORDER — PREGABALIN 75 MG CAP
75 mg | ORAL_CAPSULE | Freq: Every day | ORAL | Status: DC
Start: 2010-02-28 — End: 2010-11-17

## 2010-02-28 MED ORDER — PSYLLIUM HUSK (WITH SUGAR) 3.4 GRAM/7 GRAM ORAL POWDER
3.4 gram/7 gram | Freq: Every day | ORAL | Status: DC
Start: 2010-02-28 — End: 2013-06-13

## 2010-02-28 MED ORDER — POLYETHYLENE GLYCOL 3350 17 GRAM (100 %) ORAL POWDER PACKET
17 gram | PACK | Freq: Every day | ORAL | Status: DC
Start: 2010-02-28 — End: 2010-02-28

## 2010-02-28 MED ORDER — ACETAMINOPHEN SR 650 MG TAB
650 mg | ORAL_TABLET | Freq: Four times a day (QID) | ORAL | Status: DC | PRN
Start: 2010-02-28 — End: 2010-02-28

## 2010-02-28 MED ORDER — DOCUSATE SODIUM 100 MG CAP
100 mg | ORAL_CAPSULE | Freq: Two times a day (BID) | ORAL | Status: AC
Start: 2010-02-28 — End: 2010-05-29

## 2010-02-28 NOTE — Patient Instructions (Addendum)
English   Spanish  Hip Pain: After Your Visit  Your Care Instructions  Hip pain may be caused by many things, including overuse, a fall, or a twisting movement. Another cause of hip pain is arthritis. Your pain may increase when you stand up, walk, or squat. The pain may come and go or may be constant.  Home treatment can help relieve hip pain, swelling, and stiffness. If your pain is ongoing, you may need more tests and treatment.  Follow-up care is a key part of your treatment and safety. Be sure to make and go to all appointments, and call your doctor if you are having problems. It???s also a good idea to know your test results and keep a list of the medicines you take.  How can you care for yourself at home?  ?? Take pain medicines exactly as directed.   ?? If the doctor gave you a prescription medicine for pain, take it as prescribed.   ?? If you are not taking a prescription pain medicine, ask your doctor if you can take an over-the-counter medicine.   ?? Do not take two or more pain medicines at the same time unless the doctor told you to. Many pain medicines contain acetaminophen, which is Tylenol. Too much acetaminophen (Tylenol) can be harmful.   ?? No one younger than 20 should take aspirin. It has been linked to Reye syndrome, a serious illness.  ?? Rest and protect your hip. Take a break from any activity, including standing or walking, that may cause pain.   ?? Put ice or a cold pack against your hip for 10 to 20 minutes at a time. Try to do this every 1 to 2 hours for the next 3 days (when you are awake) or until the swelling goes down. Put a thin cloth between the ice and your skin.   ?? Sleep on your healthy side with a pillow between your knees, or sleep on your back with pillows under your knees.   ?? If there is no swelling, you can put moist heat, a heating pad, or a warm cloth on your hip. Do gentle stretching exercises to help keep your hip flexible.    ?? Learn how to prevent falls. Have your vision and hearing checked regularly. Wear slippers or shoes with a nonskid sole.   ?? Stay at a healthy weight.   ?? Wear comfortable shoes.  When should you call for help?  Call 911 anytime you think you may need emergency care. For example, call if:  ?? You have sudden chest pain and shortness of breath, or you cough up blood.   ?? You are not able to stand or walk or bear weight.   ?? Your buttocks, legs, or feet feel numb or tingly.   ?? Your leg or foot is cool or pale or changes color.   ?? You have severe pain.  Call your doctor now or seek immediate medical care if:  ?? You have signs of infection, such as:   1. Increased pain, swelling, warmth, or redness in the hip area.   2. Red streaks leading from the hip area.   3. Pus draining from the hip area.   4. Swollen lymph nodes in your neck, armpits, or groin.   5. A fever.  ?? You have signs of a blood clot, such as:   1. Pain in your calf, back of the knee, thigh, or groin.   2. Redness and swelling in  your leg or groin.  ?? You are not able to bend, straighten, or move your leg normally.   ?? You have trouble urinating or having bowel movements.  Watch closely for changes in your health, and be sure to contact your doctor if:  ?? You do not get better as expected.        Where can you learn more?   Go to [email or web address]  Enter Z720 in the search box to learn more about "Hip Pain: After Your Visit."   ?? 1995-2010 [employer]. Care instructions adapted under license by [employer] (which disclaims liability or warranty for this information). This care instruction is for use with your licensed healthcare professional. If you have questions about a medical condition or this instruction, always ask your healthcare professional. Healthwise disclaims any warranty or liability for your use of this information.  Content Version: 8.8.72453; Last Revised: March 25, 2009English   Spanish   Home Blood Sugar Testing: After Your Visit  Your Care Instructions  A home blood sugar test measures how much sugar (glucose) is in your blood at the time you check it. You can do the test anywhere. All you need is a blood glucose meter. There are many reasons to test your blood sugar.  ?? You need to know when your blood sugar is high or low to prevent an emergency.   ?? You need to know if your blood sugar is often too high. High blood sugar can cause problems with your heart, kidneys, nerves, and blood vessels.   ?? If you take rapid- or short-acting insulin before meals, you may need to adjust your dose based on both your blood sugar at that time and the amount of carbohydrate (sugars) in the meal you are about to eat.   ?? You need to know how exercise, diet, stress, and being ill affect your blood sugar.  You may also use home blood sugar testing to:  ?? Choose the correct first insulin dose and schedule, or adjust your insulin doses or schedule.   ?? Check for high or low blood sugar when you have symptoms.  The American Diabetes Association (ADA) recommends that you keep your blood sugar levels at 70 to 130 before meals and less than 180 when tested 1 to 2 hours after meals. But your doctor may set a range for you. If you are pregnant and have diabetes, for example, your target blood sugar range would be different (lower).  Follow-up care is a key part of your treatment and safety. Be sure to make and go to all appointments, and call your doctor if you are having problems. It???s also a good idea to know your test results and keep a list of the medicines you take.  How can you care for yourself at home?  ?? You need to check your blood sugar at least one time a day.   ?? If you take insulin, you may need to check it several times a day, usually every time that you are taking an insulin injection. Make sure to discuss this with your doctor.    ?? You should also check your blood sugar anytime that you have signs of high blood sugar, illness, or low blood sugar. Symptoms of high blood sugar include being very thirsty and urinating often. Symptoms of low blood sugar include feeling anxious, being hungry, or sweating a lot.  ?? Before checking your blood sugar, make sure that you have set your meter to match  the current batch of test strips. This will make sure your reading is reliable.   ?? Wash your hands with warm, soapy water. Dry them well with a clean towel. You may also use an alcohol wipe to clean the area where you get the blood.   ?? Put a clean needle (lancet) in the pen-sized holder.   ?? Remove a test strip from the bottle. Replace the lid right away. This keeps moisture from affecting the other strips. Test strips are sometimes stored inside the meter.   ?? Prepare the blood glucose meter. Follow the instructions included with your meter.   ?? Stick the side of your fingertip with the needle or lancet. Do not stick the tip of your finger. It will be more painful and you may not get enough blood to do the test. Some new blood glucose meters use needle devices that can get a blood sample from your palm or forearm.   ?? Put a drop of blood on the correct spot of the test strip. Some meters also let you put the test strip in the machine before you place the drop of blood on it.   ?? Every blood glucose meter is different. Some may need a larger or smaller drop of blood. Follow the instructions carefully.   ?? Using a clean cotton ball, apply pressure where you stuck your finger. This will stop the bleeding.   ?? Follow the directions with your blood glucose meter to get the results. Some meters take only a few seconds to give the results.    ?? Write down the results and the time that you tested your blood. Avoid getting blood on your record book. You and your doctor will use this record to see how often your blood sugar is within the target range. Your doctor will also use the results to decide if your medicines should be changed. Make sure to bring your blood glucose meter to every doctor's visit.  When should you call for help?  Call your doctor now or seek immediate medical care if:  ?? Your blood sugar level stays below a safe range (hypoglycemia) after you eat some food that has sugar.   ?? Your blood sugar level stays above a safe range (hyperglycemia) after you follow steps for treating high blood sugar.  Watch closely for changes in your health, and be sure to contact your doctor if:  ?? You often have problems with high or low blood sugar.        Where can you learn more?   Go to [email or web address]  Enter I351 in the search box to learn more about "Home Blood Sugar Testing: After Your Visit".   ?? 2006 - 2009 [employer]. Care instructions adapted under license by [employer] (which disclaims liability or warranty for this information) . This care instruction is for use with your licensed healthcare professional. If you have questions about a medical condition or this instruction, always ask your healthcare professional. Healthwise disclaims any warranty or liability for your use of this information.English   Spanish  Leg Pain: After Your Visit  Your Care Instructions  Many things can cause leg pain. Too much exercise or overuse can cause a muscle cramp (or charley horse). You can get leg cramps from not eating a balanced diet that has enough potassium, calcium, and other minerals. If you do not drink enough fluids or are taking certain medicines, you may develop leg cramps. Other causes of leg pain include  injuries, blood flow problems, nerve damage, and twisted and enlarged veins (varicose veins).   You can usually ease pain with self-care. Your doctor may recommend that you rest your leg and keep it elevated.  Follow-up care is a key part of your treatment and safety. Be sure to make and go to all appointments, and call your doctor if you are having problems. It???s also a good idea to know your test results and keep a list of the medicines you take.  How can you care for yourself at home?  ?? Take pain medicines exactly as directed.   ?? If the doctor gave you a prescription medicine for pain, take it as prescribed.   ?? If you are not taking a prescription pain medicine, ask your doctor if you can take an over-the-counter medicine.   ?? Do not take two or more pain medicines at the same time unless the doctor told you to. Many pain medicines contain acetaminophen, which is Tylenol. Too much acetaminophen (Tylenol) can be harmful.   ?? No one younger than 20 should take aspirin. It has been linked to Reye syndrome, a serious illness.  ?? Take any other medicines exactly as prescribed. Call your doctor if you think you are having a problem with your medicine.   ?? Rest your leg while you have pain, and avoid standing for long periods of time.   ?? Prop up your leg at or above the level of your heart when possible.   ?? Make sure you are eating a balanced diet that is rich in calcium, potassium, and magnesium, especially if you are pregnant.   ?? If directed by your doctor, put ice or a cold pack on the area for 10 to 20 minutes at a time. Put a thin cloth between the ice and your skin.   ?? Your leg may be in a splint, a brace, or an elastic bandage, and you may have crutches to help you walk. Follow your doctor's directions about how long to wear supports and how to use the crutches.  When should you call for help?  Call 911 anytime you think you may need emergency care. For example, call if:  ?? You have sudden chest pain and shortness of breath, or you cough up blood.   ?? Your leg is cool or pale or changes color.   Call your doctor now or seek immediate medical care if:  ?? You have increasing or severe pain.   ?? Your leg suddenly feels weak and you cannot move it.   ?? You have signs of a blood clot, such as:   ?? Pain in your calf, back of the knee, thigh, or groin.   ?? Redness and swelling in your leg or groin.  ?? You have signs of infection, such as:   ?? Increased pain, swelling, warmth, or redness.   ?? Red streaks leading from the sore area.   ?? Pus draining from a place on your leg.   ?? Swollen lymph nodes in your neck, armpits, or groin.   ?? A fever.  ?? You cannot bear weight on your leg.  Watch closely for changes in your health, and be sure to contact your doctor if:  ?? You do not get better as expected.        Where can you learn more?   Go to [email or web address]  Enter [id number] in the search box to learn more about "Leg Pain: After Your Visit."   ??  1995-2010 [employer]. Care instructions adapted under license by [employer] (which disclaims liability or warranty for this information). This care instruction is for use with your licensed healthcare professional. If you have questions about a medical condition or this instruction, always ask your healthcare professional. [employer]wise disclaims any warranty or liability for your use of this information.  Content Version: 8.8.72453; Last Revised: March 19, 2009English   Spanish  Constipation in Adults: After Your Visit  Your Care Instructions  Constipation means you have a hard time passing stools (bowel movements). People pass stools anywhere from 3 times a day to once every 3 days. What is normal for you may be different. Constipation may occur with pain in the rectum and cramping. The pain may get worse when you try to pass stools. Sometimes there are small amounts of bright red blood on toilet paper or the surface of stools due to enlarged veins near the rectum (hemorrhoids).   A few changes in your diet and lifestyle may help you avoid continuing constipation. Your doctor may also prescribe medicine to help loosen your stool.  Some medicines (such as pain medicines or antidepressants) can cause constipation. Tell your doctor about all the medicines you take. Your doctor may want to make a medicine change to ease your symptoms.  Follow-up care is a key part of your treatment and safety. Be sure to make and go to all appointments, and call your doctor if you are having problems. It???s also a good idea to know your test results and keep a list of the medicines you take.  How can you care for yourself at home?  ?? Drink plenty of fluids, enough so that your urine is mostly clear. If you have kidney, heart, or liver disease and have to limit fluids, talk with your doctor before you increase the amount of fluids you drink.   ?? Include high-fiber foods, such as fruits, vegetables, beans, and whole grains, in your diet each day.   ?? Get at least 30 minutes of exercise on most days of the week. Walking is a good choice. You also may want to do other activities, such as running, swimming, cycling, or playing tennis or team sports.   ?? Take a fiber supplement, such as Citrucel or Metamucil, every day. Start with a small dose and very slowly increase the dose over a month or more.   ?? Schedule time each day for a bowel movement. A daily routine may help. Take your time having your bowel movement.   ?? Support your feet with a small step stool when you sit on the toilet. This helps flex your hips and places your pelvis in a squatting position.   ?? Your doctor may recommend an over-the-counter laxative to relieve your constipation. Examples are Milk of Magnesia and MiraLax. Read and follow all instructions on the label, and do not use laxatives on a long-term basis.  When should you call for help?  Call your doctor now or seek immediate medical care if:   ?? Your stools are black and tarlike or have streaks of blood.   ?? You have new belly pain, or your belly pain gets worse.   ?? You are vomiting.  Watch closely for changes in your health, and be sure to contact your doctor if:  ?? Your constipation does not improve or gets worse.   ?? You have other changes in your bowel habits, such as the size or shape of your stools.   ??  You have any leaking of your stool.   ?? You think a medicine you take is causing your constipation.        Where can you learn more?   Go to [email or web address]  Enter P343 in the search box to learn more about "Constipation in Adults: After Your Visit."   ?? 1995-2010 [employer]. Care instructions adapted under license by [employer] (which disclaims liability or warranty for this information). This care instruction is for use with your licensed healthcare professional. If you have questions about a medical condition or this instruction, always ask your healthcare professional. [employer]wise disclaims any warranty or liability for your use of this information.  Content Version: 8.8.72453; Last Revised: May 5, 2009English   Spanish  Arthritis: After Your Visit  Your Care Instructions  Arthritis, also called osteoarthritis, is a breakdown of the cartilage that cushions your joints. When the cartilage wears down, your bones rub against each other. This causes pain and stiffness. Many people have some arthritis as they age. Arthritis most often affects the joints of the spine, hands, hips, knees, or feet.  You can take simple measures to protect your joints, ease your pain, and help you stay active.  Follow-up care is a key part of your treatment and safety. Be sure to make and go to all appointments, and call your doctor if you are having problems. It???s also a good idea to know your test results and keep a list of the medicines you take.  How can you care for yourself at home?   ?? Stay at a healthy weight. Being overweight puts extra strain on your joints.   ?? Talk to your doctor or physical therapist about exercises that will help ease joint pain.   ?? Stretch. You may enjoy gentle forms of yoga to help keep your joints and muscles flexible.   ?? Walk instead of jog. Other types of exercise that are less stressful on the joints include riding a bicycle, swimming, or water exercise.   ?? Lift weights. Strong muscles help reduce stress on your joints. Stronger thigh muscles, for example, take some of the stress off of the knees and hips. Learn the right way to lift weights so you do not make joint pain worse.  ?? Take your medicines exactly as prescribed. Call your doctor if you think you are having a problem with your medicine.   ?? Take pain medicines exactly as directed.   ?? If the doctor gave you a prescription medicine for pain, take it as prescribed.   ?? If you are not taking a prescription pain medicine, ask your doctor if you can take an over-the-counter medicine.   ?? Do not take two or more pain medicines at the same time unless the doctor told you to. Many pain medicines have acetaminophen, which is Tylenol. Too much acetaminophen (Tylenol) can be harmful.  ?? Use a cane, crutch, walker, or another device if you need help to get around. These can help rest your joints. You also can use other things to make life easier, such as a higher toilet seat and padded handles on kitchen utensils.   ?? Do not sit in low chairs, which can make it hard to get up.   ?? Put heat or cold on your sore joints as needed. Use whichever helps you most. You also can take turns with hot and cold packs.   ?? Apply heat 2 or 3 times a day for 20 to 30 minutes???using  a heating pad, hot shower, or hot pack???to relieve pain and stiffness.   ?? Put ice or a cold pack on your sore joint for 10 to 20 minutes at a time. Put a thin cloth between the ice and your skin.   ?? Your doctor may suggest you try the supplements glucosamine and chondroitin. They are part of what makes up normal cartilage. Tell your doctor if you have diabetes, allergies to shellfish, or if you take warfarin (Coumadin).  When should you call for help?  Call your doctor now or seek immediate medical care if:  ?? You have sudden swelling, warmth, or pain in any joint.   ?? You have joint pain and a fever or rash.   ?? You have such bad pain that you cannot use a joint.  Watch closely for changes in your health, and be sure to contact your doctor if:  ?? You have mild joint symptoms that continue even with more than 6 weeks of care at home.   ?? You have stomach pain or other problems with your medicine.     Where can you learn more?   Go to [email or web address]  Enter [phone] in the search box to learn more about "Arthritis: After Your Visit."   ?? 1995-2010 [employer]. Care instructions adapted under license by [employer] (which disclaims liability or warranty for this information). This care instruction is for use with your licensed healthcare professional. If you have questions about a medical condition or this instruction, always ask your healthcare professional. [employer]wise disclaims any warranty or liability for your use of this information.  Content Version: 8.8.72453; Last Revised: April 22, 2009English   Spanish  Diabetic Neuropathy: After Your Visit  Your Care Instructions  When you have diabetes, your blood sugar level may get too high. Over time, high blood sugar levels can damage nerves. This is called diabetic neuropathy.   Nerve damage can cause pain, burning, tingling, and numbness and may leave you feeling weak. The feet are often affected. When you have nerve damage in your feet, you cannot feel your feet and toes as well as normal and may not notice cuts or sores. Even a small injury can lead to a serious infection. It is very important that you follow your doctor's advice on foot care.  Sometimes diabetes damages nerves that help the body function. If this happens, your blood pressure, sweating, digestion, and urination might be affected. Your doctor may give you a target blood sugar level that is higher or lower than you are used to. Try to keep your blood sugar very close to this target level to prevent more damage.  Follow-up care is a key part of your treatment and safety. Be sure to make and go to all appointments, and call your doctor if you are having problems. It???s also a good idea to know your test results and keep a list of the medicines you take.  How can you care for yourself at home?  ?? Take your medicines exactly as prescribed. Call your doctor if you think you are having a problem with your medicine. It is very important that you take your insulin or diabetes pills as your doctor tells you.   ?? Try to keep blood sugar at your target level.   ?? Follow your diet. Eat a variety of foods, with carbohydrate spread out in your meals. A dietitian can help you plan meals.   ?? Try to get at least 30 minutes of  exercise on most days.   ?? Check your blood sugar as many times each day as your doctor recommends.  ?? Take and record your blood pressure at home if your doctor tells you to. Learn the importance of the two measures of blood pressure (such as 130 over 80, or 130/80). To take your blood pressure at home:   ?? Ask your doctor to check your blood pressure monitor to be sure it is accurate and the cuff fits you. Also ask your doctor to watch you to make sure that you are using it right.    ?? Do not use medicine known to raise blood pressure (such as some nasal decongestant sprays) before taking your blood pressure.   ?? Avoid taking your blood pressure if you have just exercised or are nervous or upset. Rest at least 15 minutes before you take a reading.  ?? Take pain medicines exactly as directed.   ?? If the doctor gave you a prescription medicine for pain, take it as prescribed.   ?? If you are not taking a prescription pain medicine, ask your doctor if you can take an over-the-counter medicine.   ?? Do not take two or more pain medicines at the same time unless the doctor told you to. Many pain medicines have acetaminophen, which is Tylenol. Too much acetaminophen (Tylenol) can be harmful.  ?? Do not smoke. Smoking can increase your chance for a heart attack or stroke. If you need help quitting, talk to your doctor about stop-smoking programs and medicines. These can increase your chances of quitting for good.   ?? Limit alcohol to 2 drinks a day for men and 1 drink a day for women. Too much alcohol can cause health problems.   ?? Eat small meals often, rather than 2 or 3 large meals a day.  To care for your feet  ?? Prevent injury by wearing shoes at all times, even when you are indoors.   ?? Do foot care as part of your daily routine. Wash your feet and then rub lotion on your feet, but not between your toes. Use a handheld mirror or magnifying mirror to inspect your feet for blisters, cuts, cracks, or sores.   ?? Have your toenails trimmed and filed straight across.   ?? Wear shoes and socks that fit well. Soft shoes that have good support and that fit well (such as tennis shoes) are best for your feet.   ?? Check your shoes for any loose objects or rough edges before you put them on.   ?? Ask your doctor to check your feet during each visit. Your doctor may notice a foot problem you have missed.   ?? Get early treatment for any foot problem, even a minor one.  When should you call for help?   Call your doctor now or seek immediate medical care if:  ?? You have a sore or any type of injured skin on your toes or feet.   ?? Your feet have blue or black areas, which can mean bruising or blood flow problems.   ?? You have peeling skin or tiny blisters between your toes, or cracking or oozing of the skin.   ?? You have new numbness or tingling in your feet that does not go away after you move your feet or change positions.  Watch closely for changes in your health, and be sure to contact your doctor if:  ?? You want help with foot care or cutting  your toenails.   ?? You have frequent problems with high or low blood sugar levels. Your medicines or your insulin may need to be changed.     Where can you learn more?   Go to [email or web address]  Enter V828 in the search box to learn more about "Diabetic Neuropathy: After Your Visit."   ?? 1995-2010 [employer]wise[employer] Inc[employer]. Care instructions adapted under license by [employer] (which disclaims liability or warranty for this information). This care instruction is for use with your licensed healthcare professional. If you have questions about a medical condition or this instruction, always ask your healthcare professional. Healthwise disclaims any warranty or liability for your use of this information.  Content Version: 8.8.72453; Last Revised: September 30, 2009English   Spanish  Neuropathic Pain: After Your Visit  Your Care Instructions  Neuropathic pain, which is also called nerve pain, results from pressure on or damage to nerves in the body. Medical problems such as diabetes or shingles or an injury to a nerve itself can cause this type of pain. Nerve pain is most often a sharp, burning, or stabbing pain, but some people may feel it as a dull ache. It may come and go, or it may be constant.   Your pain is a real condition. It is not just in your head. Treatment can help and may include pain medicines, exercise, and physical therapy. Neuropathic pain alters nerve signals, often making your skin very sensitive. Touch, pressure, and other sensations that did not used to hurt may become painful. Medicines such as antidepressants and anticonvulsants can help decrease the number of pain signals that travel over the nerves and make the painful areas less sensitive. These medicines can also help you sleep better and improve your mood.  Pain can affect many parts of your life???your mood, sleep, work, hobbies, home life, and relationships with friends and family. Taking an active role in your treatment will help you better manage your pain. Your doctor may recommend cognitive-behavioral therapy and stress management to help you learn how to cope better with pain. It may also help to make changes in your daily life, such as quitting smoking, lowering your blood pressure, and, if you have diabetes, keeping your blood sugar in your target blood sugar range. Using just one method of treatment does not work well enough for most people. Pain medicines are very important but are only one part of successful treatment.  If you feel that your treatment is not working, talk to your doctor so that you can work together to try something else. Also talk to your doctor if you feel depressed or anxious. There are treatments that can help.  Follow-up care is a key part of your treatment and safety. Be sure to make and go to all appointments, and call your doctor if you are having problems. It???s also a good idea to know your test results and keep a list of the medicines you take.  How can you care for yourself at home?  ?? Pace yourself. Break up large jobs into smaller tasks. Save harder tasks for days when you have less pain, or go back and forth between hard tasks and easier ones. Take rest breaks.    ?? Relax, and reduce stress. Relaxation techniques such as deep breathing or meditation can help.   ?? Keep moving. Gentle, daily exercise can help reduce pain over the long run. Try low- or no-impact exercises such as walking, swimming, and stationary biking. Do stretches and range-of-motion exercises  to stay strong and flexible. Talk with your doctor or physical therapist about what exercises are best for you.   ?? Try heat, cold packs, and massage.   ?? Get enough sleep. Chronic pain can make you tired and drain your energy. Talk with your doctor if you have trouble sleeping because of pain.   ?? Think positive. Your thoughts can affect your pain level. Do things that you enjoy to distract yourself when you have pain instead of focusing on the pain. See a movie, read a book, listen to music, or spend time with a friend.   ?? Keep a daily pain diary. Record how intense the pain is and what kind of pain you have. Track how your moods, thoughts, sleep patterns, activities, and medicine affect your pain. Having a record of your pain can help you and your doctor find the best ways to treat your pain.   ?? Take your pain medicines exactly as directed. This is very important in keeping your pain under control. Here are a few other tips on taking your medicine:   ?? If you take pain medicine on a regular schedule, take it at the right time and in the right amount.   ?? If you take pain medicine ???as needed,??? take it at the first sign of pain. Do not wait until the pain gets bad.   ?? Some people who take pain medicine worry about getting addicted. Addiction to pain medicine is rare if you have not had a problem with addiction in the past and you take your medicine as directed by your doctor. Most of the medicines used for neuropathic pain do not cause addiction.   ?? Do not increase or decrease your pain medicine without talking to your doctor first.    ?? Call your doctor if you think you are having a problem with your medicine. If you have been taking pain medicine for a long time, stopping it suddenly can cause bad side effects.   ?? Never take your pain medicine with alcohol or other drugs.   ?? Tell your doctor about any other medicines you are taking, including over-the-counter drugs, herbal and dietary supplements.  Reducing constipation caused by pain medicine  Pain medicines often cause constipation, which makes stools hard, dry, and difficult to pass. To reduce constipation:  ?? Drink plenty of fluids, enough so that your urine is mostly clear. If you have kidney, heart, or liver disease and have to limit fluids, talk with your doctor before you increase the amount of fluids you drink.   ?? Include high-fiber foods, such as fruits, vegetables, beans, and whole grains, in your diet each day.   ?? Walk for 15 to 20 minutes 2 times a day. If walking is not an option, ask your doctor about other ways to be active.   ?? Limit foods that make constipation worse, such as milk, ice cream, white rice, cheese, bananas, applesauce, chocolate, and cooked carrots.   ?? Ask your doctor about whether to take a laxative. The goal is to have one easy bowel movement every 1 to 2 days. Do not let constipation go untreated for longer than 3 days.  When should you call for help?  Call your doctor now or seek immediate medical care if:  ?? You are feeling down or blue, or you are not enjoying things like you once did. You may be depressed, which is common in people who have chronic pain. Depression can be treated.   ??  You have trouble with bowel movements, such as:   ?? No bowel movement in 3 days.   ?? Blood in the anal area, in your stool, or on the toilet paper.   ?? Diarrhea for more than 24 hours.  Watch closely for changes in your health, and be sure to contact your doctor if:  ?? Your pain is getting worse.   ?? You cannot sleep because of pain.    ?? You are very worried or anxious about your pain.   ?? You have trouble taking your pain medicine.   ?? You have any concerns about your pain medicine or its side effects.   ?? You have vomiting or cramps for more than 2 hours.     Where can you learn more?   Go to [email or web address]  Enter [phone] in the search box to learn more about "Neuropathic Pain: After Your Visit."   ?? 1995-2010 Healthwise, Incorporated. Care instructions adapted under license by [employer] (which disclaims liability or warranty for this information). This care instruction is for use with your licensed healthcare professional. If you have questions about a medical condition or this instruction, always ask your healthcare professional. Healthwise disclaims any warranty or liability for your use of this information.  Content Version: 8.8.72453; Last Revised: January 13, 2008

## 2010-02-28 NOTE — Progress Notes (Signed)
Pt has c/o all toes hurt, thighs cramping, and constipation.

## 2010-03-02 LAB — METABOLIC PANEL, COMPREHENSIVE
A-G Ratio: 1.4 (ref 1.1–2.5)
ALT (SGPT): 20 IU/L (ref 0–40)
AST (SGOT): 29 IU/L (ref 0–40)
Albumin: 4 g/dL (ref 3.5–4.7)
Alk. phosphatase: 70 IU/L (ref 25–165)
BUN/Creatinine ratio: 24 (ref 11–26)
BUN: 23 mg/dL (ref 8–27)
Bilirubin, total: 0.2 mg/dL (ref 0.0–1.2)
CO2: 25 mmol/L (ref 20–32)
Calcium: 9.8 mg/dL (ref 8.6–10.2)
Chloride: 104 mmol/L (ref 97–108)
Creatinine: 0.95 mg/dL (ref 0.57–1.00)
GFR est AA: 65 mL/min/{1.73_m2} (ref >59)
GFR est non-AA: 56 mL/min/{1.73_m2} — ABNORMAL LOW (ref >59)
GLOBULIN, TOTAL: 2.9 g/dL (ref 1.5–4.5)
Glucose: 142 mg/dL — ABNORMAL HIGH (ref 65–99)
Potassium: 4.3 mmol/L (ref 3.5–5.2)
Protein, total: 6.9 g/dL (ref 6.0–8.5)
Sodium: 140 mmol/L (ref 135–145)

## 2010-03-02 LAB — LIPID PANEL
Cholesterol, total: 189 mg/dL (ref 100–199)
HDL Cholesterol: 56 mg/dL (ref >39)
LDL, calculated: 120 mg/dL — ABNORMAL HIGH (ref 0–99)
Triglyceride: 66 mg/dL (ref 0–149)
VLDL, calculated: 13 mg/dL (ref 5–40)

## 2010-03-02 LAB — VITAMIN D, 1, 25 DIHYDROXY: Calcitriol (Vit D 1, 25 di-OH): 45.1 pg/mL (ref 10.0–75.0)

## 2010-03-02 LAB — HEMOGLOBIN A1C WITH EAG: Hemoglobin A1c: 6.5 % — ABNORMAL HIGH (ref 4.8–5.6)

## 2010-03-02 LAB — MICROALBUMIN, RANDOM URINE: Microalbumin, urine: 3.9 ug/mL (ref 0.0–17.0)

## 2010-03-02 NOTE — Progress Notes (Signed)
Subjective:    Chelsea Schultz is a 76 y.o. female who presents for left lateral thigh pain    Back Pain   Chelsea Schultz is a 76 y.o. female who has a history of low back pain for several month(s), is positional with bending or lifting, with radiation down the legs. Severity of pain is 10 out of 10. numbness, tingling, weakness is present in bilateral legs to her feet. Precipitating factors: none recalled by the patient. Prior history of back problems: previous osteoarthritis of lumbar spine. The patient is here today because of left lateral thigh pain in the location of the left greater trochanter.  The pain is described as a cramping pain 7/10,  She and her daughter went to a graduation ceremony in [place].  They drove down in a car for three hours going and coming back.  She sat an additional two during the ceremony, which seems to have aggravating the problem.  Self treatment: She is taking Lyrica 75 mg PO only in the am.  She was supposed to take Lyrica 100 mg PO qhs, but in noncompliant.  She takes Lortab 5/500 approximately once a day, with some relief.   The patient denies fevers, chills or sweats. The patient denies bowel/bladder incontinence and saddle numbness.   She still continues to have neuropathic pain, numbness and tingling primarily in her toes bilaterally.  She has difficulty lying flat on her back because of the pain. She also has limitations in activities because of the pain. She is having increasing difficulty walking and standing for prolonged periods of time. Her daughter states that she is occasionally unstable in her gait and is a potential risk for falls. She did attend Physical Therapy which did improve the symptoms.    Most recent MRI of her spine shows extensive lumbar degeneration. She has seen a Insurance account manager, as well as a Retail buyer. The consensus is that she is not ready for surgery.        2.  Constipation     Patient is a 76 y.o. female with a history of Constipation.  For the past 2 weeks.  This has been associated with No periumbilical pain and nausea.  Patient denies any fevers, chill, diarrhea, or bleeding.  Patient reports that the last bowel movement was 2 ago.  Patient has been receiving opiates for control of pain.  Lortab 5/500 mg PO once a day.        Review of Systems, additional:   A comprehensive review of systems was negative except for that written in the HPI.       Objective:    Blood pressure 137/63, pulse 74, temperature 98 ??F (36.7 ??C), temperature source Oral, resp. rate 20, height 4' 7.5" (1.41 m), weight 125 lb 9.6 oz (56.972 kg), last menstrual period 10/23/1978.    Appearance: alert, well appearing, and in no distress.   General exam: CVS exam BP noted to be moderately elevated today in office, S1, S2 normal, no gallop, no murmur, chest clear, no JVD, no HSM, no edema, heart sounds normal rate, regular rhythm, normal S1, S2, no murmurs, rubs, clicks or gallops.   Patient appears to be in moderate pain, antalgic gait noted. Lumbosacral spine area reveals local tenderness no mass. Tenderness along the left lateral greater trochanteric area on palpation.  Painful LS ROM noted. Spinal alignment is normal. Straight leg raise is positive at 30 degrees on both sides. Lumbosacral distribution DTR's, motor strength and sensation are normal,  including heel and toe gait. Peripheral pulses are palpable and 2 plus. Prior studies inlcude: Lumbar spine X-Ray: shows DJD changes, likely chronic.   Feet:  Clean, dry, no ulcerations.  Microfilament test is felt on bilateral feet, however the right is felt more than the left.  Metatarsals are intact bilaterally.  ROM normal.      Assessment/Plan:      1. Peripheral neuropathy (356.9DC)  pregabalin (LYRICA) 75 mg capsule, pregabalin (LYRICA) 100 mg capsule    2. Vitamin D deficiency (268.9G)  VITAMIN D, 1, 25 DIHYDROXY, calcium-cholecalciferol, D3, (CALCIUM 600 + D) tablet   3. Need for lipid screening (V77.59F)  LIPID PANEL   4. Diabetes (250.00Q)  glipiZIDE (GLUCOTROL) 5 mg tablet, METABOLIC PANEL, COMPREHENSIVE, HEMOGLOBIN A1C, MICROALBUMIN, RANDOM URINE   5. Hip pain, bilateral (719.45T)  XR HIP RIGHT AP / LATERAL, XR HIP LEFT AP / LATERAL   6. Constipation (564.00A)  polyethylene glycol (MIRALAX) 17 gram packet, Psyllium Husk-Sucrose (METAMUCIL) 3.4 gram/7 gram Powd, docusate sodium (COLACE) 100 mg capsule   7. Back pain with radiation (724.5AE)  hydrocodone-acetaminophen (LORTAB) 5-500 mg per tablet       I believe that the patients peripheral neuropathy is worsening because she had decreased her Lyrica as prescribed.  I have instructed her to restart Lyrica 75 mg PO in the am and Lyrica 100 mg PO qhs.  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  Therefore, I believe that the pain felt in her thighs are radicular pain aggravated by her recent trip.  Instructed to take frequent breaks when traveling in a car.  Also, she takes Lortab which has a propensity to constipate.  I have added a regimen of Colace and Metamucil.      RTC in 1 month for follow up

## 2010-03-04 NOTE — Progress Notes (Addendum)
I reviewed the patient's medical history, the resident's findings on physical examination, the patient's diagnoses, and treatment plan as documented in the resident note.  I concur with the treatment plan as documented.  Additional suggestions noted.

## 2010-03-10 NOTE — Telephone Encounter (Signed)
Left message for daughter, Lynden Ang or pt to call back.  Did leave message results were put in mail yesterday, but will go over results by phone, when pt or daughter calls back.

## 2010-03-10 NOTE — Telephone Encounter (Signed)
Message copied by Miki Kins on Thu Mar 10, 2010  3:06 PM  ------       Message from: Margarito Courser       Created: Thu Mar 10, 2010  1:01 PM       Regarding: Arville Go: [phone]         Patient's daughter Olegario Messier is calling to get the recent lab results for her mother they have not heard any new please call to advise              A secondary # is [phone]

## 2010-03-30 NOTE — Telephone Encounter (Signed)
Message copied by Miki Kins on Wed Mar 30, 2010  2:15 PM  ------       Message from: Dory Peru A       Created: Tue Mar 29, 2010  4:38 PM       Regarding: Penha:  Rx compression hose         [facility]/03-29-2010/MBradby/3:44pm/dob April 09, 2028/Dr Penha/[phone] ex [id number] or [id number]              Pt needs a prescription for a compression hose due to swelling in feet and legs.  Will pick up, please call when done.

## 2010-04-04 ENCOUNTER — Encounter

## 2010-04-04 MED ORDER — COMPRESSION STOCKING,KNEE HIGH,REGULAR,LARGE CIRCUMFER.
Freq: Every day | Status: DC
Start: 2010-04-04 — End: 2013-06-13

## 2010-04-04 NOTE — Telephone Encounter (Addendum)
Spoke with pt today and hose were prescribed.

## 2010-04-04 NOTE — Telephone Encounter (Addendum)
Pt glucose level elevated.  She is a diabetic.  She takes Glipizide 5 mg PO 1 tablet in the morning. And 1/2 tab in pm.  May increase to 1 tab in the am and pm.  Pt going away for summer and will come in to repeat labs while fasting to determine if medication needs increase.

## 2010-04-04 NOTE — Telephone Encounter (Signed)
Message copied by Miki Kins on Mon Apr 04, 2010  2:39 PM  ------       Message from: MCGEE, CONSTANCE H       Created: Mon Apr 04, 2010 11:26 AM       Regarding: Penha         2nd request regarding the compression hose message.  Stated no one has call in over one week.         Also requesting x-ray results.

## 2010-04-08 NOTE — Telephone Encounter (Signed)
Message copied by Tye Savoy on Fri Apr 08, 2010 11:03 AM  ------       Message from: Eddie Candle N       Created: Fri Apr 08, 2010  8:43 AM       Regarding: penha         Patient daughter dropped letter for you on this morning for review.              Re: Rx for compression hose                     thanks

## 2010-04-08 NOTE — Progress Notes (Signed)
See labs collected today.

## 2010-04-09 LAB — METABOLIC PANEL, COMPREHENSIVE
A-G Ratio: 1.3 (ref 1.1–2.5)
ALT (SGPT): 13 IU/L (ref 0–40)
AST (SGOT): 23 IU/L (ref 0–40)
Albumin: 3.9 g/dL (ref 3.5–4.7)
Alk. phosphatase: 69 IU/L (ref 25–165)
BUN/Creatinine ratio: 23 (ref 11–26)
BUN: 25 mg/dL (ref 8–27)
Bilirubin, total: 0.3 mg/dL (ref 0.0–1.2)
CO2: 24 mmol/L (ref 20–32)
Calcium: 9.9 mg/dL (ref 8.6–10.2)
Chloride: 106 mmol/L (ref 97–108)
Creatinine: 1.07 mg/dL — ABNORMAL HIGH (ref 0.57–1.00)
GFR est AA: 56 mL/min/{1.73_m2} — ABNORMAL LOW (ref >59)
GFR est non-AA: 49 mL/min/{1.73_m2} — ABNORMAL LOW (ref >59)
GLOBULIN, TOTAL: 3.1 g/dL (ref 1.5–4.5)
Glucose: 43 mg/dL — ABNORMAL LOW (ref 65–99)
Potassium: 4.3 mmol/L (ref 3.5–5.2)
Protein, total: 7 g/dL (ref 6.0–8.5)
Sodium: 141 mmol/L (ref 135–145)

## 2010-04-09 LAB — HEMOGLOBIN A1C WITH EAG: Hemoglobin A1c: 6.4 % — ABNORMAL HIGH (ref 4.8–5.6)

## 2010-04-14 NOTE — Telephone Encounter (Signed)
Script done and left up front.

## 2010-07-01 LAB — METABOLIC PANEL, COMPREHENSIVE
A-G Ratio: 1 — ABNORMAL LOW (ref 1.1–2.2)
ALT (SGPT): 34 U/L (ref 12–78)
AST (SGOT): 23 U/L (ref 15–37)
Albumin: 3.8 g/dL (ref 3.5–5.0)
Alk. phosphatase: 86 U/L (ref 50–136)
Anion gap: 3 mmol/L — ABNORMAL LOW (ref 5–15)
BUN/Creatinine ratio: 20 (ref 12–20)
BUN: 22 MG/DL — ABNORMAL HIGH (ref 6–20)
Bilirubin, total: 0.2 MG/DL (ref 0.2–1.0)
CO2: 29 MMOL/L (ref 21–32)
Calcium: 10.2 MG/DL — ABNORMAL HIGH (ref 8.5–10.1)
Chloride: 104 MMOL/L (ref 97–108)
Creatinine: 1.1 MG/DL (ref 0.6–1.3)
GFR est AA: >60 mL/min/{1.73_m2} (ref >60)
GFR est non-AA: 51 mL/min/{1.73_m2} — ABNORMAL LOW (ref >60)
Globulin: 4 g/dL (ref 2.0–4.0)
Glucose: 115 MG/DL — ABNORMAL HIGH (ref 65–100)
Potassium: 3.9 MMOL/L (ref 3.5–5.1)
Protein, total: 7.8 g/dL (ref 6.4–8.2)
Sodium: 136 MMOL/L (ref 136–145)

## 2010-07-01 LAB — CBC WITH AUTOMATED DIFF
ABS. BASOPHILS: 0 10*3/uL (ref 0.0–0.1)
ABS. EOSINOPHILS: 0.3 10*3/uL (ref 0.0–0.4)
ABS. LYMPHOCYTES: 2.7 10*3/uL (ref 0.8–3.5)
ABS. MONOCYTES: 1 10*3/uL (ref 0.0–1.0)
ABS. NEUTROPHILS: 4.5 10*3/uL (ref 1.8–8.0)
BASOPHILS: 0 % (ref 0–1)
EOSINOPHILS: 3 % (ref 0–7)
HCT: 37.7 % (ref 35.0–47.0)
HGB: 12.7 g/dL (ref 11.5–16.0)
LYMPHOCYTES: 31 % (ref 12–49)
MCH: 27.9 PG (ref 26.0–34.0)
MCHC: 33.7 g/dL (ref 30.0–36.5)
MCV: 82.7 FL (ref 80.0–99.0)
MONOCYTES: 12 % (ref 5–13)
NEUTROPHILS: 54 % (ref 32–75)
PLATELET: 230 10*3/uL (ref 150–400)
RBC: 4.56 M/uL (ref 3.80–5.20)
RDW: 13.5 % (ref 11.5–14.5)
WBC: 8.4 10*3/uL (ref 3.6–11.0)

## 2010-07-01 LAB — CK W/ CKMB & INDEX
CK - MB: 3.7 NG/ML — ABNORMAL HIGH (ref 0.5–3.6)
CK-MB Index: 1.8 (ref 0–2.5)
CK: 207 U/L — ABNORMAL HIGH (ref 26–192)

## 2010-07-01 LAB — AMB POC GLUCOSE BLOOD, BY GLUCOSE MONITORING DEVICE: Glucose POC: 125

## 2010-07-01 LAB — TROPONIN I: Troponin-I, Qt.: <0.04 ng/mL (ref <0.05)

## 2010-07-01 LAB — NT-PRO BNP: NT pro-BNP: 66 PG/ML (ref 0–450)

## 2010-07-01 NOTE — ED Notes (Signed)
Have re-examined patient.  Condition has improved.  Patient is ready to go home.  I have discussed pertinent laboratory and radiology findings.  I have answered their questions and/or the family's questions.  I have discussed need for appropriate follow up.  I have discussed signs and symptoms to return to the ED. BP stable.  Neurop stable  Patient ready to go home

## 2010-07-01 NOTE — Progress Notes (Signed)
Pt having c/o L leg pain, swelling of both feet, and feeling dizzy Sunday & this morning.

## 2010-07-01 NOTE — ED Notes (Signed)
Sent by Dr. Lily Lovings for hypertension and fluid retention.

## 2010-07-01 NOTE — ED Notes (Signed)
Pt lying on stretcher in no acute distress.  Voices no complaints at present.  Daughter at bedside.  Awaiting dispo.

## 2010-07-01 NOTE — ED Notes (Signed)
Report received from Burnard Bunting, Rn.

## 2010-07-01 NOTE — Patient Instructions (Signed)
Lightheadedness or Faintness: After Your Visit  Your Care Instructions  Lightheadedness is a feeling that you are about to faint or "pass out." You do not feel as if you or your surroundings are moving. It is different from vertigo, which is the feeling that you or things around you are spinning or tilting.  Lightheadedness usually goes away or gets better when you lie down. If lightheadedness gets worse, it can lead to a fainting spell.  It is common to feel lightheaded from time to time. Lightheadedness usually is not caused by a serious problem. It often is caused by a short-lasting drop in blood pressure and blood flow to your head that occurs when you get up too quickly from a seated or lying position.  Follow-up care is a key part of your treatment and safety. Be sure to make and go to all appointments, and call your doctor if you are having problems. It's also a good idea to know your test results and keep a list of the medicines you take.  How can you care for yourself at home?  ?? Lie down for 1 or 2 minutes when you feel lightheaded. After lying down, sit up slowly and remain sitting for 1 to 2 minutes before slowly standing up.  ?? Avoid movements, positions, or activities that have made you lightheaded in the past.  ?? Get plenty of rest, especially if you have a cold or flu, which can cause lightheadedness.  ?? Make sure you drink plenty of fluids, especially if you have a fever or have been sweating.  ?? Do not drive or put yourself and others in danger while you feel lightheaded.  When should you call for help?  Call 911 anytime you think you may need emergency care. For example, call if:  ?? You have signs of a stroke. These may include:  ?? Sudden numbness, paralysis, or weakness in your face, arm, or leg, especially on only one side of your body.  ?? New problems with walking or balance.  ?? Sudden vision changes.  ?? New problems speaking or understanding simple statements, or feeling confused.   ?? A sudden, severe headache that is different from past headaches.  ?? You have symptoms of a heart attack. These may include:  ?? Chest pain or pressure, or a strange feeling in the chest.  ?? Sweating.  ?? Shortness of breath.  ?? Nausea or vomiting.  ?? Pain, pressure, or a strange feeling in the back, neck, jaw, or upper belly or in one or both shoulders or arms.  ?? Lightheadedness or sudden weakness.  ?? A fast or irregular heartbeat.  After you call 911, the operator may tell you to chew 1 adult-strength or 2 to 4 low-dose aspirin. Wait for an ambulance. Do not try to drive yourself.  Watch closely for changes in your health, and be sure to contact your doctor if:  ?? Your lightheadedness gets worse or does not get better with home care.      Where can you learn more?   Go to [email or web address]  Enter [phone] in the search box to learn more about "Lightheadedness or Faintness: After Your Visit."   ?? 2006-2011 Healthwise, Inc[employer]. Care instructions adapted under license by [employer] (which disclaims liability or warranty for this information). This care instruction is for use with your licensed healthcare professional. If you have questions about a medical condition or this instruction, always ask your healthcare professional. [employer] disclaims any warranty or  liability for your use of this information.  Content Version: 9.0.56994; Last Revised: November 25, 2009

## 2010-07-01 NOTE — Progress Notes (Signed)
Chelsea Schultz is a 76 y.o. female  Who presents with the following complaints today:    1.    Pt presents with her daughter complaining of 4 days of dizziness without syncope.    Latest episode occurred today. She does have a history of hypertension and takes Exforge.  She did take her medication today.  She also takes Lasix, . Patient also has associated symptoms of  general feeling of lightheadedness.    focal neurologic sx of unsteady balance  tachycardia/palpitations  excessive thirst  urination. The patient denies chest pain, shortness of breath, melena, nausea, diarrhea.    Taking culprit meds?: diuretics, calcium channel blockers  Admits to constipation and decreased appetite.  She prepares all her meals.      Patient Active Problem List   Diagnoses Date Noted   ??? Diabetes [250.00Q] 10/25/2009   ??? Back pain with radiation [724.5AE] 10/25/2009   ??? HTN (hypertension) [401.9AF] 08/22/2009       Current outpatient prescriptions   Medication Sig Dispense Refill   ??? glipiZIDE (GLUCOTROL) 5 mg tablet Take  by mouth two (2) times a day. Take one in morning and 1/2 @@ supper.        ??? pregabalin (LYRICA) 75 mg capsule Take 1 Cap by mouth daily.  30 Cap  6   ??? pregabalin (LYRICA) 100 mg capsule Take 1 Cap by mouth nightly.  30 Cap  6   ??? calcium-cholecalciferol, D3, (CALCIUM 600 + D) tablet Take 2 Tabs by mouth daily.  60 Tab  12   ??? Psyllium Husk-Sucrose (METAMUCIL) 3.4 gram/7 gram Powd Take 1 Cap by mouth daily.  368 g  3   ??? hydrocodone-acetaminophen (LORTAB) 5-500 mg per tablet Take 1 Tab by mouth every four (4) hours as needed for Pain.  30 Tab  3   ??? amlodipine-valsartan (EXFORGE) 10-320 mg per tablet Take 1 Tab by mouth daily.  30 Tab  11   ??? potassium chloride (K-DUR, KLOR-CON) 20 mEq tablet Take 1 Tab by mouth daily.  1 Tab  11   ??? furosemide (LASIX) 40 mg tablet Take 1 Tab by mouth two (2) times a day for 360 days.  60 Tab  11    ??? Comp.Stocking,Knee,Regular,Lrg Misc 1 Each by Does Not Apply route daily.  1 Each  3       Past Medical History   Diagnosis Date   ??? Diabetes    ??? Hypertension    ??? Neuropathy        Family History   Problem Relation Age of Onset   ??? Diabetes Mother    ??? Hypertension Father           Review of Systems  Pertinent items are noted in HPI.    Objective:     BP 158/85   Pulse 97   Temp 97 ??F (36.1 ??C)   Resp 20   Ht 4' 7.5" (1.41 m)   Wt 133 lb (60.328 kg)   BMI 30.36 kg/m2   LMP 10/23/1978    Orthostatic Vitals 07/01/2010   Temp 97   Temp Source Oral   Pulse 97   Resp 20   BP 158/85   BP 1 Location Right arm   BP 1 Method Automatic   Patient Position 1 Sitting   Pulse 2 79   BP 2 171/89   BP 2 Location Right arm   BP Method 2 Automatic   Patient Position 2 Supine  Pulse 3 82   BP 3 187/90   BP 3 Location Right arm   BP Method 3 Automatic   Patient Position 3 Sitting   Pulse 4 96   BP 4 215/101   BP 4 Location Right arm   BP Method 4 Automatic   Patient Position 4 Standing   Height 4' 7.5"   Weight 133 lb   Weight Source Standing scale (comment)     General:  alert, cooperative, no distress, appears stated age, flushed   Oropharynx: normal   Neck: no nuchal rigidity, no masses, no carotid bruit, +JVD   Lung: clear to auscultation bilaterally   Heart:  Tachycardic, no murmur, click, rub or gallop   Abdomen: soft, non-tender. Bowel sounds normal. No masses,  no organomegaly   Extremities: extremities normal, atraumatic, no cyanosis or 1+ edema, Palpable cords   Skin: flushed and visible tenting.         Assessment/Plan:   1. Dizziness (780.4A)  Pt to the ER for evaluation.   2. Diabetes (250.00Q)  AMB POC GLUCOSE BLOOD, BY GLUCOSE MONITORING DEVICE   3. Back pain with radiation (724.5AE)     4. Labile hypertension (401.9EN)         Follow-up Disposition:  Return in about 1 week (around 07/08/2010), or if symptoms worsen or fail to improve.Marland Kitchen     Refer to the ER for evaluation and treatment.  Spoke to the [employer].  Pt escorted without incidence.

## 2010-07-01 NOTE — ED Provider Notes (Signed)
Patient is a 76 y.o. female presenting with hypertension and ankle swelling. The history is provided by the patient.   Hypertension   This is a recurrent problem. The current episode started 6 to 12 hours ago (was at PCP office and noted HTN of 21`0 systolic and patient was dizzy.  Sent to ED for work up.  Has been having intermittent dizziness spells). The problem has been gradually improving. Associated symptoms include malaise/fatigue, peripheral edema and dizziness. Pertinent negatives include no chest pain, no palpitations, no anxiety, no blurred vision, no headaches, no tinnitus, no neck pain, no shortness of breath, no nausea and no vomiting. There are no associated agents to hypertension. Risk factors include a sedentary lifestyle.   Ankle swelling   This is a chronic problem. The problem occurs constantly. The problem has not changed since onset. The patient is experiencing no pain. Associated symptoms include stiffness. Pertinent negatives include no numbness, full range of motion, no back pain and no neck pain. Treatments tried: takes medication  The treatment provided moderate relief. There has been no history of extremity trauma.        Past Medical History   Diagnosis Date   ??? Diabetes    ??? Hypertension    ??? Neuropathy           No past surgical history on file.      Family History   Problem Relation Age of Onset   ??? Diabetes Mother    ??? Hypertension Father           History   Social History   ??? Marital Status: Widowed     Spouse Name: N/A     Number of Children: N/A   ??? Years of Education: N/A   Occupational History   ??? Not on file.   Social History Main Topics   ??? Smoking status: Never Smoker    ??? Smokeless tobacco: Never Used   ??? Alcohol Use: No   ??? Drug Use: No   ??? Sexually Active: Not on file   Other Topics Concern   ??? Not on file   Social History Narrative   ??? No narrative on file                    ALLERGIES: Review of patient's allergies indicates no known allergies.      Review of Systems    Constitutional: Positive for malaise/fatigue and fatigue. Negative for fever, appetite change and unexpected weight change.   HENT: Negative for facial swelling, rhinorrhea, trouble swallowing, neck pain, dental problem, tinnitus and ear discharge.    Eyes: Negative for blurred vision, pain, discharge and visual disturbance.   Respiratory: Negative for apnea, shortness of breath and stridor.    Cardiovascular: Positive for leg swelling. Negative for chest pain and palpitations.   Gastrointestinal: Negative for nausea, vomiting, abdominal pain, diarrhea, blood in stool and abdominal distention.   Genitourinary: Negative for dysuria, hematuria, flank pain and difficulty urinating.   Musculoskeletal: Positive for stiffness. Negative for myalgias, back pain, joint swelling and arthralgias.   Skin: Negative for color change, rash and wound.   Neurological: Positive for dizziness and weakness. Negative for syncope, facial asymmetry, speech difficulty, numbness and headaches.   Hematological: Negative for adenopathy.   Psychiatric/Behavioral: Negative for suicidal ideas, hallucinations, behavioral problems, self-injury and agitation.       Filed Vitals:    07/01/10 1727 07/01/10 1758 07/01/10 1826   BP: 187/83 152/74    Pulse:  93 80    Temp: 98.2 ??F (36.8 ??C)     Resp: 20     Height: 4\' 7"  (1.397 m)     Weight: 133 lb (60.328 kg)     SpO2: 97% 95% 100%              Physical Exam   Nursing note and vitals reviewed.  Constitutional: She is oriented to person, place, and time. She appears well-developed and well-nourished. No distress.   HENT:   Head: Normocephalic and atraumatic.   Right Ear: External ear normal.   Left Ear: External ear normal.   Mouth/Throat: Oropharynx is clear and moist. No oropharyngeal exudate.   Eyes: Conjunctivae and EOM are normal. Pupils are equal, round, and reactive to light. Right eye exhibits no discharge. Left eye exhibits no discharge. No scleral icterus.    Neck: Normal range of motion. No tracheal deviation present. No thyromegaly present.   Cardiovascular: Normal rate and regular rhythm.    No murmur heard.       Distant heart sounds   Pulmonary/Chest: Effort normal and breath sounds normal. No respiratory distress. She has no wheezes. She has no rales. She exhibits no tenderness.   Abdominal: Soft. She exhibits no distension. No tenderness. She has no rebound and no guarding.   Musculoskeletal: Normal range of motion. She exhibits edema. She exhibits no tenderness.        +1-2/4 bipedal edema   Lymphadenopathy:     She has no cervical adenopathy.   Neurological: She is alert and oriented to person, place, and time. No cranial nerve deficit. Coordination normal.   Skin: Skin is warm. No erythema.   Psychiatric: She has a normal mood and affect. Her behavior is normal. Judgment and thought content normal.        MDM    Procedures  ED EKG interpretation:  Rhythm: normal sinus rhythm; and regular . Rate (approx.): 73; Axis: normal; P wave: normal; QRS interval: normal ; ST/T wave: normal; in  Lead: ; Other findings: normal. This EKG was interpreted by Sunnie Nielsen. Mort Sawyers, DO,ED Provider.

## 2010-07-01 NOTE — ED Notes (Signed)
Discharge instructions reviewed with pt and daughter.  Questions answered.  Pt declines offer for wheelchair to car.  Pt ready for discharge in no acute distress.

## 2010-07-02 LAB — EKG, 12 LEAD, INITIAL
Atrial Rate: 73 {beats}/min
Calculated P Axis: 56 degrees
Calculated R Axis: 32 degrees
Calculated T Axis: 34 degrees
Diagnosis: NORMAL
P-R Interval: 162 ms
Q-T Interval: 388 ms
QRS Duration: 74 ms
QTC Calculation (Bezet): 427 ms
Ventricular Rate: 73 {beats}/min

## 2010-07-04 NOTE — Progress Notes (Addendum)
Glucose:  125

## 2010-09-21 ENCOUNTER — Encounter

## 2010-09-22 MED ORDER — HYDROCODONE-ACETAMINOPHEN 5 MG-500 MG TAB
5-500 mg | ORAL_TABLET | ORAL | Status: DC | PRN
Start: 2010-09-22 — End: 2010-11-10

## 2010-09-22 MED ORDER — GLIPIZIDE 5 MG TAB
5 mg | ORAL_TABLET | Freq: Two times a day (BID) | ORAL | Status: DC
Start: 2010-09-22 — End: 2010-12-28

## 2010-09-22 MED ORDER — PREGABALIN 100 MG CAP
100 mg | ORAL_CAPSULE | Freq: Every evening | ORAL | Status: DC
Start: 2010-09-22 — End: 2011-03-13

## 2010-09-22 MED ORDER — POTASSIUM CHLORIDE SR 20 MEQ TAB, PARTICLES/CRYSTALS
20 mEq | ORAL_TABLET | Freq: Every day | ORAL | Status: DC
Start: 2010-09-22 — End: 2010-09-23

## 2010-09-22 MED ORDER — FUROSEMIDE 40 MG TAB
40 mg | ORAL_TABLET | ORAL | Status: DC
Start: 2010-09-22 — End: 2011-09-29

## 2010-09-22 MED ORDER — AMLODIPINE-VALSARTAN 10 MG-320 MG TAB
10-320 mg | ORAL_TABLET | Freq: Every day | ORAL | Status: DC
Start: 2010-09-22 — End: 2011-06-22

## 2010-09-22 NOTE — Telephone Encounter (Signed)
rx escribed for klor-con was for quantity of 1 tablet only.  pls verify quantity.

## 2010-09-22 NOTE — Telephone Encounter (Signed)
Because Lortab 5/500 mg tabs & Lyrica 100 mg caps, as ordered by Dr. Lily Lovings, could not be sent electronically, both meds were called into pt's pharmacy, [facility] @ [phone].Marland Kitchen

## 2010-09-23 ENCOUNTER — Encounter

## 2010-09-23 MED ORDER — POTASSIUM CHLORIDE SR 20 MEQ TAB, PARTICLES/CRYSTALS
20 mEq | ORAL_TABLET | Freq: Every day | ORAL | Status: DC
Start: 2010-09-23 — End: 2011-09-29

## 2010-11-10 ENCOUNTER — Telehealth

## 2010-11-11 MED ORDER — HYDROCODONE-ACETAMINOPHEN 5 MG-500 MG TAB
5-500 mg | ORAL_TABLET | ORAL | Status: DC | PRN
Start: 2010-11-11 — End: 2010-11-17

## 2010-11-11 NOTE — Telephone Encounter (Signed)
Pt's daugther, Lynden Ang informed, pain med-Lortab was called into [facility] @ [phone].  Called into [facility] Lortab 5/500 mg tab-Take one tab by mouth every 4 hrs as needed for pain #30 w/0 Refill, per Dr. Lily Lovings.

## 2010-11-14 NOTE — Telephone Encounter (Signed)
Agreed -

## 2010-11-17 MED ORDER — NAPROXEN 500 MG TAB
500 mg | ORAL_TABLET | Freq: Two times a day (BID) | ORAL | Status: DC
Start: 2010-11-17 — End: 2011-03-27

## 2010-11-17 MED ORDER — HYDROCODONE-ACETAMINOPHEN 7.5 MG-500 MG TAB
ORAL_TABLET | Freq: Four times a day (QID) | ORAL | Status: DC | PRN
Start: 2010-11-17 — End: 2011-02-13

## 2010-11-17 NOTE — Progress Notes (Signed)
Chief Complaint   Patient presents with   ??? Hip Pain     L hip, nagging, frequent pain   ??? Leg Pain     L thigh pain, nagging, frequent pain

## 2010-11-17 NOTE — Progress Notes (Signed)
Chelsea Schultz is a 77 y.o. female  Who presents with the following complaints today:    1.  Hip Pain  Patient complains of left hip pain. Onset of the symptoms was several years ago, problem is longstanding, this episode began several years ago. Inciting event: known DJD. Current symptoms include left hip pain.  Severity = moderate  radiates to: leg: left. Associated symptoms: none. Aggravating symptoms: walking, kneeling, pivoting, rising after sitting, any weight bearing. Patient's course of pain: gradually worsening. Patient has had prior hip problems. Previous visits for this problem: multiple, this is a diagnosis of longstanding. Last seen 1 month ago by a consultant for this condition. Evaluation to date: plain films: abnormal  Ortho consult.  Treatment to date: OTC analgesics PRN: ineffective  prescription analgesics per med orders: not very effective.      Current outpatient prescriptions   Medication Sig   ??? HYDROcodone-acetaminophen (LORTAB 7.5) 7.5-500 mg per tablet Take 1 Tab by mouth every six (6) hours as needed for Pain.   ??? naproxen (NAPROSYN) 500 mg tablet Take 1 Tab by mouth two (2) times daily (with meals).   ??? potassium chloride (K-DUR, KLOR-CON) 20 mEq tablet Take 1 Tab by mouth daily.   ??? glipiZIDE (GLUCOTROL) 5 mg tablet Take 1 Tab by mouth two (2) times a day. Take one in morning and 1/2 @@ supper.   ??? pregabalin (LYRICA) 100 mg capsule Take 1 Cap by mouth nightly.   ??? amLODIPine-valsartan (EXFORGE) 10-320 mg per tablet Take 1 Tab by mouth daily.   ??? calcium-cholecalciferol, D3, (CALCIUM 600 + D) tablet Take 2 Tabs by mouth daily.   ??? Psyllium Husk-Sucrose (METAMUCIL) 3.4 gram/7 gram Powd Take 1 Cap by mouth daily.   ??? furosemide (LASIX) 40 mg tablet Take 1 Tab by mouth. Take 1 tab by mouth daily   ??? Comp.Stocking,Knee,Regular,Lrg Misc 1 Each by Does Not Apply route daily.       No Known Allergies  Patient's last menstrual period was 10/23/1978.      History obtained from the patient   ?? General ROS: negative for - chills, fever or malaise  ?? Psychological ROS: negative for - anxiety, depression, sleep disturbances or suicidal ideation  ?? Respiratory ROS: no cough, shortness of breath, or wheezing  ?? Cardiovascular ROS: no chest pain or dyspnea on exertion  ?? Gastrointestinal ROS: no abdominal pain, change in bowel habits, or black or bloody stools  ?? Musculoskeletal: no back pain,  Positive left hip joint pain, joint stiffness, left leg muscle pain.  No muscle weakness      Physical Exam    BP 150/80   Pulse 84   Temp(Src) 98.1 ??F (36.7 ??C) (Oral)   Resp 20   Ht 4\' 7"  (1.397 m)   Wt 129 lb (58.514 kg)   BMI 29.98 kg/m2   LMP 10/23/1978    Constitutional:  Appears well, No Acute Distress, Vitals noted  Psychiatric:  Affect normal, no obvious memory problems, Alert and Oriented to person/place/time  Eyes:  Pupils equally round and reactive, EOMI, conjunctiva clear, eyelids normal  ENT:  External ears and nose normal, lips/teeth/gums normal,  TMs and Oropharynx are normal  Neck:  Supple, Thyroid is normal.  No abnormal cervical or supraclavicular nodes  Lungs:  Clear to auscultation, good respiratory effort  Heart:  S1, S2 Regular, rate, and Rhythm.  No cardiac murmurs or carotid bruits/palpable thrills  Abdominal exam:  Soft, non-tender, non-distended.  No Hepatosplenomegaly.  No  masses.  Bowel sounds normal  Extremities:  No edema, clubbing or cyanosis  Musculoskeletal exam: abnormal exam of left leg.  There is point tenderness at the left greater trochanter, abnormal active range of motion of left hip.  Skin:  Warm and dry, no rashes, bruising or suspicious lesions      Assessment  1. Greater trochanteric bursitis (726.5AJ)  HYDROcodone-acetaminophen (LORTAB 7.5) 7.5-500 mg per tablet, naproxen (NAPROSYN) 500 mg tablet, VITAMIN D, 25 HYDROXY   2. Lipid screening (V77.91D)  NMR LIPOPROFILE   3. Diabetes mellitus (250.00A)  METABOLIC PANEL, COMPREHENSIVE, HEMOGLOBIN A1C, MICROALBUMIN, UR, RAND    4. HTN (hypertension) (401.9AF)  CBC WITH AUTOMATED DIFF, METABOLIC PANEL, COMPREHENSIVE       Plan:    See orders and patient instructions  1)  Remember to follow a low fat, low cholesterol, and low salt diet.  Exercise at least 30 minutes daily.  2)  Yearly physical exams should be scheduled once a year.  Your birth month    Rest and elevate the affected painful area.  Apply cold compresses intermittently as needed.  As pain recedes, begin normal activities slowly as tolerated.  Call if symptoms persist.

## 2010-12-24 LAB — METABOLIC PANEL, COMPREHENSIVE
A-G Ratio: 1.4 (ref 1.1–2.5)
ALT (SGPT): 16 IU/L (ref 0–40)
AST (SGOT): 27 IU/L (ref 0–40)
Albumin: 3.8 g/dL (ref 3.5–4.7)
Alk. phosphatase: 65 IU/L (ref 25–165)
BUN/Creatinine ratio: 30 — ABNORMAL HIGH (ref 11–26)
BUN: 26 mg/dL (ref 8–27)
Bilirubin, total: 0.2 mg/dL (ref 0.0–1.2)
CO2: 25 mmol/L (ref 20–32)
Calcium: 9.9 mg/dL (ref 8.6–10.2)
Chloride: 103 mmol/L (ref 97–108)
Creatinine: 0.88 mg/dL (ref 0.57–1.00)
GFR est AA: 71 mL/min/{1.73_m2} (ref >59)
GFR est non-AA: 61 mL/min/{1.73_m2} (ref >59)
GLOBULIN, TOTAL: 2.8 g/dL (ref 1.5–4.5)
Glucose: 173 mg/dL — ABNORMAL HIGH (ref 65–99)
Potassium: 3.9 mmol/L (ref 3.5–5.2)
Protein, total: 6.6 g/dL (ref 6.0–8.5)
Sodium: 140 mmol/L (ref 134–144)

## 2010-12-24 LAB — CBC WITH AUTOMATED DIFF
ABS. BASOPHILS: 0 10*3/uL (ref 0.0–0.2)
ABS. EOSINOPHILS: 0.3 10*3/uL (ref 0.0–0.4)
ABS. IMM. GRANS.: 0 10*3/uL (ref 0.0–0.1)
ABS. LYMPHOCYTES: 2.2 10*3/uL (ref 0.7–4.5)
ABS. MONOCYTES: 1 10*3/uL (ref 0.1–1.0)
ABS. NEUTROPHILS: 5.5 10*3/uL (ref 1.8–7.8)
BASOPHILS: 0 % (ref 0–3)
EOSINOPHILS: 3 % (ref 0–7)
HCT: 36.1 % (ref 34.0–44.0)
HGB: 11.8 g/dL (ref 11.5–15.0)
IMMATURE GRANULOCYTES: 0 % (ref 0–2)
LYMPHOCYTES: 25 % (ref 14–46)
MCH: 27.8 pg (ref 27.0–34.0)
MCHC: 32.7 g/dL (ref 32.0–36.0)
MCV: 85 fL (ref 80–98)
MONOCYTES: 11 % (ref 4–13)
NEUTROPHILS: 61 % (ref 40–74)
PLATELET: 281 10*3/uL (ref 140–415)
RBC: 4.24 x10E6/uL (ref 3.80–5.10)
RDW: 13.9 % (ref 11.7–15.0)
WBC: 9.1 10*3/uL (ref 4.0–10.5)

## 2010-12-24 LAB — MICROALBUMIN, UR, RAND W/ MICROALB/CREAT RATIO
Creatinine, urine random: 201.7 mg/dL (ref 15.0–278.0)
Microalb/Creat ratio (ug/mg creat.): 6.5 mg/g creat (ref 0.0–30.0)
Microalbumin, urine: 13.2 ug/mL (ref 0.0–17.0)

## 2010-12-24 LAB — HEMOGLOBIN A1C WITH EAG: Hemoglobin A1c: 6.5 % — ABNORMAL HIGH (ref 4.8–5.6)

## 2010-12-26 LAB — NMR LIPOPROFILE WITH LIPIDS (WITHOUT GRAPH)
Cholesterol, Total: 170 mg/dL (ref <200)
HDL-C: 42 mg/dL (ref >=40)
HDL-P (Total): 29 umol/L — ABNORMAL LOW (ref >=30.5)
LDL size: 21.2 nm (ref >20.5)
LDL-C: 106 mg/dL — ABNORMAL HIGH (ref <100)
LDL-P: 1677 nmol/L — ABNORMAL HIGH (ref <1000)
LP-IR score: 30 (ref <=45)
Small LDL-P: 570 nmol/L — ABNORMAL HIGH (ref <=527)
Triglycerides: 110 mg/dL (ref <150)

## 2010-12-26 LAB — VITAMIN D, 25 HYDROXY: VITAMIN D, 25-HYDROXY: 32.6 ng/mL (ref 30.0–100.0)

## 2010-12-28 LAB — AMB POC EKG ROUTINE W/ 12 LEADS, INTER & REP

## 2010-12-28 MED ORDER — GLIPIZIDE 10 MG TAB
10 mg | ORAL_TABLET | Freq: Two times a day (BID) | ORAL | Status: DC
Start: 2010-12-28 — End: 2011-03-10

## 2010-12-28 MED ORDER — FLUCONAZOLE 200 MG TAB
200 mg | ORAL_TABLET | Freq: Every day | ORAL | Status: AC
Start: 2010-12-28 — End: 2011-01-04

## 2010-12-28 NOTE — Progress Notes (Signed)
77 yr old bf here for annual well woman pe/health review and to discuss lab results.

## 2010-12-28 NOTE — Progress Notes (Signed)
Subjective:   77 y.o. female for annual routine checkup.  She is postmenopausal.  Social History: not sexually active.  Pertinent past medical history: hypertension, DM, Back pain.    Patient Active Problem List   Diagnoses Date Noted   ??? Diabetes 10/25/2009   ??? Back pain with radiation 10/25/2009   ??? HTN (hypertension) 08/22/2009     Current Outpatient Prescriptions   Medication Sig Dispense Refill   ??? fluconazole (DIFLUCAN) 200 mg tablet Take 1 Tab by mouth daily for 7 days.  7 Tab  0   ??? glipiZIDE (GLUCOTROL) 10 mg tablet Take 1 Tab by mouth two (2) times a day.  60 Tab  6   ??? HYDROcodone-acetaminophen (LORTAB 7.5) 7.5-500 mg per tablet Take 1 Tab by mouth every six (6) hours as needed for Pain.  30 Tab  4   ??? naproxen (NAPROSYN) 500 mg tablet Take 1 Tab by mouth two (2) times daily (with meals).  60 Tab  4   ??? potassium chloride (K-DUR, KLOR-CON) 20 mEq tablet Take 1 Tab by mouth daily.  30 Tab  4   ??? furosemide (LASIX) 40 mg tablet Take 1 Tab by mouth. Take 1 tab by mouth daily  30 Tab  4   ??? pregabalin (LYRICA) 100 mg capsule Take 1 Cap by mouth nightly.  30 Cap  4   ??? amLODIPine-valsartan (EXFORGE) 10-320 mg per tablet Take 1 Tab by mouth daily.  30 Tab  11   ??? Comp.Stocking,Knee,Regular,Lrg Misc 1 Each by Does Not Apply route daily.  1 Each  3   ??? calcium-cholecalciferol, D3, (CALCIUM 600 + D) tablet Take 2 Tabs by mouth daily.  60 Tab  12   ??? Psyllium Husk-Sucrose (METAMUCIL) 3.4 gram/7 gram Powd Take 1 Cap by mouth daily.  368 g  3     No Known Allergies  Past Medical History   Diagnosis Date   ??? Diabetes    ??? Hypertension    ??? Neuropathy      No past surgical history on file.  Family History   Problem Relation Age of Onset   ??? Diabetes Mother    ??? Hypertension Father      History   Substance Use Topics   ??? Smoking status: Never Smoker    ??? Smokeless tobacco: Never Used   ??? Alcohol Use: No       ROS:  Feeling well. Alopecia.  No dyspnea or chest pain on exertion.  No abdominal pain, complaining of constipation,  black or bloody stools.  No urinary tract symptoms. GYN ROS: no vaginal bleeding, no discharge or pelvic pain. Menopausal symptoms: none. No neurological complaints.  Back pain.  Lower leg bilateral, numbness and tingling.     Objective:   BP 150/72   Pulse 76   Temp(Src) 97 ??F (36.1 ??C) (Oral)   Wt 127 lb (57.607 kg)  The patient appears well, alert, oriented x 3, in no distress.  ENT normal.  Neck supple. No adenopathy or thyromegaly. PERLA. Lungs are clear, good air entry, no wheezes, rhonchi or rales. S1 and S2 normal, no murmurs, regular rate and rhythm. Abdomen soft without tenderness, guarding, mass or organomegaly. Extremities show no edema, normal peripheral pulses. Neurological is normal, no focal findings.    Rectal exam: negative without mass, lesions or tenderness, no tenderness noted, stool guaiac negative.  Lab Results   Component Value Date/Time    Hemoglobin A1C 6.5 12/23/2010  4:15 PM  Results for orders placed in visit on 11/17/10   NMR LIPOPROFILE     Status: Abnormal       Component Value Range Status Comment    LDL-P 1677 (*) <1000 (nmol/L) Final     LDL-C 106 (*) <100 (mg/dL) Final     HDL-C 42  >=16 (mg/dL) Final     Triglycerides 110  <150 (mg/dL) Final     Cholesterol, Total 170  <200 (mg/dL) Final     HDL-P (Total) 29.0 (*) >= 10.9 (umol/L) Final     Small LDL-P 570 (*) <= 527 (nmol/L) Final     LDL size 21.2  > 20.5 (nm) Final     LP-IR score 30  <= 45  Final        EKG:  NSR    Assessment/Plan:   well woman  postmenopausal  counseled on osteoporosis and adequate intake of calcium and vitamin D  additional lab tests per orders  return annually or prn    1. HTN (hypertension)  AMB POC EKG ROUTINE W/ 12 LEADS, INTER & REP, CBC WITH AUTOMATED DIFF, VITAMIN D, 25 HYDROXY, METABOLIC PANEL, COMPREHENSIVE   2. Nail fungus  fluconazole (DIFLUCAN) 200 mg tablet    3. Lower leg pain  VAS DUPLEX LOWER EXT VENOUS BILAT, REFERRAL TO PODIATRY   4. Diabetes mellitus  glipiZIDE (GLUCOTROL) 10 mg tablet, REFERRAL TO PODIATRY, HEMOGLOBIN A1C, MICROALBUMIN, RANDOM URINE, METABOLIC PANEL, COMPREHENSIVE   5. Hyperlipidemia  NMR LIPOPROFILE   6. Alopecia  REFERRAL TO DERMATOLOGY, TSH, 3RD GENERATION     Follow-up Disposition:  Return in about 3 months (around 03/30/2011), or if symptoms worsen or fail to improve, for diabetes blood work.  reviewed diet, exercise and weight control.    Hypertension (elevated blood pressure)  - My Recommendations  -Purchase a blood pressure cuff that goes around your upper arm and check blood pressure at least 3 times per week.   Write down your numbers for review.  Check blood pressure in the morning and evening.  Rest for 5 minutes with feet elevated and arm resting on a table (at mid-chest level) when checking blood pressure  -Exercise 30-60 minutes most days of the week, preferably with a mix of cardiovascular and strength training.   Exercise can improve blood pressure, even if you do not lose weight!  -Limit alcohol intake to less than 2-3 drinks daily   -Avoid tobacco products  -Avoid caffeine, energy drinks, stimulants  -DASH diet - includes fruits, vegetables, fiber, and less than 2000 mg sodium (salt) daily. Try to eat less than 2000-2500 calories daily.   Limit fat intake.    -Avoid non-steroidal inflammatory medications (NSAIDS) such as ibuprofen, advil, motrin, aleve, and naproxyn as these may increase blood pressure if used long-term  -Avoid OTC decongestants such as pseudopherine, phenylephrine, Afrin

## 2010-12-28 NOTE — Patient Instructions (Addendum)
Toenail or Fingernail Avulsion: After Your Child's Visit  Your Care Instructions  Losing a toenail or fingernail because of an injury is called avulsion. The nail may be completely or partially torn off after a trauma to the area.  Your doctor may have removed the nail, put part of it back into place, or repaired the nail bed. Your child's toe or finger may be sore after treatment. Your child may have stitches.  There may be some swelling, color changes, and bloody crusting on or around the wound for 2 or 3 days. This is normal. Taking good care of your child's wound at home will help it heal quickly and reduce the chance of infection.  The wound should heal within a few weeks. If completely removed, fingernails may take 6 months to grow back. Toenails may take 12 to 18 months to grow back. Injured nails may look different when they grow back.  Follow-up care is a key part of your child's treatment and safety. Be sure to make and go to all appointments, and call your doctor if your child is having problems. It's also a good idea to know your child's test results and keep a list of the medicines your child takes.  How can you care for your child at home?  ?? If possible, prop up the injured area on a pillow when icing it or anytime your child sits or lies down during the next 3 days. Try to keep it above the level of your child's heart. This will help reduce swelling.   ?? Use ice or a cold pack for 10 to 20 minutes, 3 times a day, to reduce swelling and bruising. Put a thin cloth between the ice pack and your child's skin. Never put ice directly on a wound or your child's skin. Do not let the wound get wet while icing it.   ?? Leave the bandage on, and if your child has stitches, do not get them wet for the first 24 to 48 hours. Use a plastic bag to cover the area when your child showers.    ?? After the first 24 to 48 hours, your child can remove the bandage and gently wash the wound. If the bandage sticks to the wound, use warm water to loosen it. Do not scrub or soak the area. Do not let your child go swimming.   ?? Replace the bandage with a clean one at least once a day and whenever the old one gets wet or dirty. If a small wound is not likely to get dirty, is not draining, and is not in an area where clothing will rub it, your child may not need a bandage.   ?? If the doctor told you to use an antibiotic ointment, such as polymyxin B sulfate (Polysporin) or bacitracin, use it exactly as directed. Do not use an antibiotic ointment that contains neomycin (such as Neosporin), because it can cause an allergic reaction.   ?? Do not use rubbing alcohol, hydrogen peroxide, iodine, or Mercurochrome on the wound. These can harm the tissue.   ?? If your child has stitches, do not remove them on your own. Your doctor will tell you when to return to have the stitches removed.   ?? Give pain medicines exactly as directed.   ?? If the doctor gave your child a prescription medicine for pain, give it as prescribed.   ?? If your child is not taking a prescription pain medicine, ask your doctor if  your child can take an over-the-counter medicine.   ?? Do not give your child two or more pain medicines at the same time unless the doctor told you to. Many pain medicines have acetaminophen, which is Tylenol. Too much acetaminophen (Tylenol) can be harmful.   ?? Do not give aspirin to anyone younger than 20. It has been linked to Reye syndrome, a serious illness.   ?? If your doctor prescribed antibiotics for your child, give them as directed. Do not stop using them just because your child feels better. Your child needs to take the full course of antibiotics.   When should you call for help?  Call your doctor now or seek immediate medical care if:  ?? The skin near the wound is cool or pale or changes color.    ?? The wound starts to bleed, and blood soaks through the bandage. Oozing small amounts of blood is normal.   ?? Your child has signs of infection, such as:   ?? Increased pain, swelling, warmth, or redness.   ?? Red streaks leading from the toe or finger.   ?? Pus draining from the toe or finger.   ?? Swollen lymph nodes in the neck, armpits, or groin.   ?? A fever.   Watch closely for changes in your child's health, and be sure to contact your doctor if:  ?? Your child has problems with the nail as it grows back.   ?? Your child does not get better as expected.     Where can you learn more?    Go to [email or web address]   Enter [phone] in the search box to learn more about "Toenail or Fingernail Avulsion: After Your Child's Visit."    ?? 2006-2011 [employer]. Care instructions adapted under license by [employer] (which disclaims liability or warranty for this information). This care instruction is for use with your licensed healthcare professional. If you have questions about a medical condition or this instruction, always ask your healthcare professional. [employer], Inc[employer] disclaims any warranty or liability for your use of this information.  Content Version: 9.1.125182; Last Revised: October 22, 2010Hair Loss From Alopecia Areata: After Your Visit  Your Care Instructions  Alopecia areata is a type of hair loss. This condition is most common in people who are younger than 50, but it can happen to children and adults of any age. Hair loss can affect how you feel about yourself.   Your hair may fall out in clumps and grow back over time. In rare cases, a person with alopecia may lose all body hair. The pattern of hair loss and growth is different for everyone. You can treat alopecia with medicine, but treatment does not always work. You may have shots of medicine in your scalp, take pills, or put the medicine on your scalp. Or you may decide to wait and see whether your hair grows again before trying medicine.  Because hair loss is upsetting for most people, seek support from family and friends. Talk to a counselor or other professional if you need more help.  Follow-up care is a key part of your treatment and safety. Be sure to make and go to all appointments, and call your doctor if you are having problems. It???s also a good idea to know your test results and keep a list of the medicines you take.  How can you care for yourself at home?  ?? If you decide to treat your hair loss, take your medicines exactly as  prescribed. Call your doctor if you think you are having a problem with your medicine.   ?? Try hair care products and styling techniques. Hair care products or perms may make hair appear thicker. You can use dyes to color the scalp. But long-term use of perms or dyes may lead to more hair loss.   ?? Talk to your doctor if you are very upset about your hair loss. You can get counseling to help you cope with the condition.   When should you call for help?  Watch closely for changes in your health, and be sure to contact your doctor if:  ?? Your hair loss gets worse, even with treatment.   ?? You want more information about treating your hair loss.   ?? You feel sad or need help coping with hair loss.     Where can you learn more?    Go to [email or web address]   Enter [phone] in the search box to learn more about "Hair Loss From Alopecia Areata: After Your Visit."     ?? 2006-2012 [employer]. Care instructions adapted under license by [employer] (which disclaims liability or warranty for this information). This care instruction is for use with your licensed healthcare professional. If you have questions about a medical condition or this instruction, always ask your healthcare professional. [employer], [employer] disclaims any warranty or liability for your use of this information.  Content Version: 9.2.102713; Last Revised: May 12, 2010    Leg Pain: After Your Visit  Your Care Instructions  Many things can cause leg pain. Too much exercise or overuse can cause a muscle cramp (or charley horse). You can get leg cramps from not eating a balanced diet that has enough potassium, calcium, and other minerals. If you do not drink enough fluids or are taking certain medicines, you may develop leg cramps. Other causes of leg pain include injuries, blood flow problems, nerve damage, and twisted and enlarged veins (varicose veins).  You can usually ease pain with self-care. Your doctor may recommend that you rest your leg and keep it elevated.  Follow-up care is a key part of your treatment and safety. Be sure to make and go to all appointments, and call your doctor if you are having problems. It???s also a good idea to know your test results and keep a list of the medicines you take.  How can you care for yourself at home?  ?? Take pain medicines exactly as directed.   ?? If the doctor gave you a prescription medicine for pain, take it as prescribed.   ?? If you are not taking a prescription pain medicine, ask your doctor if you can take an over-the-counter medicine.   ?? Take any other medicines exactly as prescribed. Call your doctor if you think you are having a problem with your medicine.   ?? Rest your leg while you have pain, and avoid standing for long periods of time.   ?? Prop up your leg at or above the level of your heart when possible.    ?? Make sure you are eating a balanced diet that is rich in calcium, potassium, and magnesium, especially if you are pregnant.   ?? If directed by your doctor, put ice or a cold pack on the area for 10 to 20 minutes at a time. Put a thin cloth between the ice and your skin.   ?? Your leg may be in a splint, a brace, or an elastic bandage, and you  may have crutches to help you walk. Follow your doctor's directions about how long to wear supports and how to use the crutches.   When should you call for help?  Call 911 anytime you think you may need emergency care. For example, call if:  ?? You have sudden chest pain and shortness of breath, or you cough up blood.   ?? Your leg is cool or pale or changes color.   Call your doctor now or seek immediate medical care if:  ?? You have increasing or severe pain.   ?? Your leg suddenly feels weak and you cannot move it.   ?? You have signs of a blood clot, such as:   ?? Pain in your calf, back of the knee, thigh, or groin.   ?? Redness and swelling in your leg or groin.   ?? You have signs of infection, such as:   ?? Increased pain, swelling, warmth, or redness.   ?? Red streaks leading from the sore area.   ?? Pus draining from a place on your leg.   ?? A fever.   ?? You cannot bear weight on your leg.   Watch closely for changes in your health, and be sure to contact your doctor if:  ?? You do not get better as expected.     Where can you learn more?    Go to [email or web address]   Enter [phone] in the search box to learn more about "Leg Pain: After Your Visit."     ?? 2006-2011 [employer]. Care instructions adapted under license by [employer] (which disclaims liability or warranty for this information). This care instruction is for use with your licensed healthcare professional. If you have questions about a medical condition or this instruction, always ask your healthcare professional. [employer] disclaims any warranty or liability for your use of this information.  Content Version: 9.1.125182; Last Revised: January 07, 2010

## 2011-01-02 ENCOUNTER — Encounter

## 2011-01-02 NOTE — Procedures (Signed)
[facility]  *** FINAL REPORT ***    Name: Chelsea Schultz, Chelsea Schultz  MRN: [id number]  DOB: 1925/12/24  Visit: [id number]  Date: 31 Dec 2010    TYPE OF TEST: Peripheral Venous Testing    REASON FOR TEST  Pain in limb, Limb swelling, Varicose veins    Right Leg:-  Deep venous thrombosis:           No  Superficial venous thrombosis:    No  Deep venous insufficiency:        Yes  Superficial venous insufficiency: Yes    Left Leg:-  Deep venous thrombosis:           No  Superficial venous thrombosis:    No  Deep venous insufficiency:        Yes  Superficial venous insufficiency: Yes      INTERPRETATION/FINDINGS  PROCEDURE:  Evaluation of lower extremity veins with ultrasound  (B-mode imaging, pulsed Doppler, color Doppler).  Includes the common  femoral, deep femoral, femoral, popliteal,posterior tibial, peroneal,  gastrocnemius and soleal veins,  Superficial vein visualized include  the great saphenous and small saphenous veins  FINDINGS:  Gray scale and color flow duplex images of the veins in  both lower extremities demonstrate absence of  deep and superficial  veins in both lower extremities.  Incompetent deep venous system with reflux involving the right  popliteal veins. Incompetent superficial small saphenous vein with  reflux in the sapheno-popliteal junction and calf veins.Perforator  reflux noted in the right calf vein. Incompetent deep venous system  with reflux involving the left femoral veins. Incompetent superficial  vein involving the left great saphenous vein with reflux in the mid  thigh, distal thigh and calf veins.  CONCLUSION:  Bilateral lower extremity venous duplex negative for deep   venous thrombosis.or Superficial vein thrombosis  Incompetent deep and superficial venous  system with reflux involving  the right popliteal veins.   small saphenous vein with reflux in the sapheno-popliteal junction  and calf veins.Perforator reflux noted in the right calf vein.  Incompetent deep  and superficial venous system with reflux involving  the left femoral veins. Left great saphenous vein with reflux in the  mid thigh, distal thigh and calf veins.    ADDITIONAL COMMENTS    I have personally reviewed the data relevant to the interpretation of  this  study.    TECHNOLOGIST:  Melbourne Abts, RDMS, RVT    PHYSICIAN:  Electronically signed by:  Dalene Carrow. Sheliah Hatch, MD  01/02/2011 07:38 AM

## 2011-01-02 NOTE — Procedures (Signed)
[facility]  *** FINAL REPORT ***    Name: Chelsea Schultz, Chelsea Schultz  MRN: [id number]  DOB: 1926/08/04  Visit: [id number]  Date: 31 Dec 2010    TYPE OF TEST: Peripheral Venous Testing    REASON FOR TEST  Pain in limb, Limb swelling, Varicose veins    Right Leg:-  Deep venous thrombosis:           No  Superficial venous thrombosis:    No  Deep venous insufficiency:        Yes  Superficial venous insufficiency: Yes    Left Leg:-  Deep venous thrombosis:           No  Superficial venous thrombosis:    No  Deep venous insufficiency:        Yes  Superficial venous insufficiency: Yes      INTERPRETATION/FINDINGS  PROCEDURE:  Evaluation of lower extremity veins with ultrasound  (B-mode imaging, pulsed Doppler, color Doppler).  Includes the common  femoral, deep femoral, femoral, popliteal,posterior tibial, peroneal,  gastrocnemius and soleal veins,  Superficial vein visualized include  the great saphenous and small saphenous veins  FINDINGS:  Gray scale and color flow duplex images of the veins in  both lower extremities demonstrate absence of  deep and superficial  veins in both lower extremities.  Incompetent deep venous system with reflux involving the right  popliteal veins. Incompetent superficial small saphenous vein with  reflux in the sapheno-popliteal junction and calf veins.Perforator  reflux noted in the right calf vein. Incompetent deep venous system  with reflux involving the left femoral veins. Incompetent superficial  vein involving the left great saphenous vein with reflux in the mid  thigh, distal thigh and calf veins.  CONCLUSION:  Bilateral lower extremity venous duplex negative for deep   venous thrombosis.or Superficial vein thrombosis  Incompetent deep and superficial venous  system with reflux involving  the right popliteal veins.   small saphenous vein with reflux in the sapheno-popliteal junction  and calf veins.Perforator reflux noted in the right calf vein.   Incompetent deep and superficial venous system with reflux involving  the left femoral veins. Left great saphenous vein with reflux in the  mid thigh, distal thigh and calf veins.    ADDITIONAL COMMENTS    I have personally reviewed the data relevant to the interpretation of  this  study.    TECHNOLOGIST:  Melbourne Abts, RDMS, RVT    PHYSICIAN:  Electronically signed by:  Dalene Carrow. Sheliah Hatch, MD  01/02/2011 07:38 AM

## 2011-02-13 ENCOUNTER — Encounter

## 2011-02-13 MED ORDER — HYDROCODONE-ACETAMINOPHEN 7.5 MG-500 MG TAB
ORAL_TABLET | Freq: Four times a day (QID) | ORAL | Status: DC | PRN
Start: 2011-02-13 — End: 2011-03-27

## 2011-02-13 NOTE — Telephone Encounter (Signed)
Called into pt's pharmacy Lortab as ordered by Dr. Lily Lovings.

## 2011-02-21 NOTE — Telephone Encounter (Signed)
77 yr old pt last seen 12/28/10 for follow up and lower leg pain wants order for walker and if script is done by Dr. Lily Lovings, pt's insurance will cover it so pt can be more mobile.  Pls call pt's daughter Lynden Ang at work number listed and can leave message to let her know if she can pick up script or if pt has to be seen first.

## 2011-02-23 MED ORDER — WALKER
Freq: Every day | Status: DC
Start: 2011-02-23 — End: 2011-09-29

## 2011-02-23 NOTE — Telephone Encounter (Signed)
Please pick up Rx off printer and fax to appropriate place for patient.  Thank you.

## 2011-02-24 NOTE — Telephone Encounter (Signed)
Left message for daughter, Lynden Ang, that order requested is ready for pick-up.

## 2011-03-10 LAB — AMB POC GLUCOSE BLOOD, BY GLUCOSE MONITORING DEVICE: Glucose POC: 137

## 2011-03-10 NOTE — Progress Notes (Signed)
Subjective:      Chelsea Schultz is a 77 y.o. female who presents for follow-up of Type 2 diabetes mellitus.  Current symptoms/problems include hypoglycemia occurring 2-3 times per day: pattern: especially in the morning and have been worsening. Symptoms have been present for 2 weeks.    Known diabetic complications: peripheral neuropathy  Cardiovascular risk factors: diabetes mellitus  Current diabetic medications include oral agent (monotherapy): glucotrol 10 mg PO BID.     Eye exam current (within one year): yes  Weight trend: stable  Prior visit with dietician: no  Current diet: "healthy" diet  in general  Current exercise: no regular exercise    Current monitoring regimen: home blood tests - 2 times daily  Home blood sugar records: fasting range: 35-60  postprandial range: 120-200  Any episodes of hypoglycemia? yes - one week ago and she was sent to the ER with a blood sugar of 35.    Is She on ACE inhibitor or angiotensin II receptor blocker?   Yes   [employer]      Past Medical History   Diagnosis Date   ??? Diabetes    ??? Hypertension    ??? Neuropathy      No past surgical history on file.  Family History   Problem Relation Age of Onset   ??? Diabetes Mother    ??? Hypertension Father      Current Outpatient Prescriptions   Medication Sig Dispense Refill   ??? HYDROcodone-acetaminophen (LORTAB 7.5) 7.5-500 mg per tablet Take 1 Tab by mouth every six (6) hours as needed for Pain.  30 Tab  4   ??? glipiZIDE (GLUCOTROL) 10 mg tablet Take 1 Tab by mouth two (2) times a day.  60 Tab  6   ??? naproxen (NAPROSYN) 500 mg tablet Take 1 Tab by mouth two (2) times daily (with meals).  60 Tab  4   ??? potassium chloride (K-DUR, KLOR-CON) 20 mEq tablet Take 1 Tab by mouth daily.  30 Tab  4   ??? furosemide (LASIX) 40 mg tablet Take 1 Tab by mouth. Take 1 tab by mouth daily  30 Tab  4   ??? pregabalin (LYRICA) 100 mg capsule Take 1 Cap by mouth nightly.  30 Cap  4    ??? amLODIPine-valsartan (EXFORGE) 10-320 mg per tablet Take 1 Tab by mouth daily.  30 Tab  11   ??? calcium-cholecalciferol, D3, (CALCIUM 600 + D) tablet Take 2 Tabs by mouth daily.  60 Tab  12   ??? Psyllium Husk-Sucrose (METAMUCIL) 3.4 gram/7 gram Powd Take 1 Cap by mouth daily.  368 g  3   ??? Walker Misc 1 Each by Does Not Apply route daily.  1 Each  0   ??? Comp.Stocking,Knee,Regular,Lrg Misc 1 Each by Does Not Apply route daily.  1 Each  3     No Known Allergies  History     Social History   ??? Marital Status: Unknown     Spouse Name: N/A     Number of Children: N/A   ??? Years of Education: N/A     Occupational History   ??? Not on file.     Social History Main Topics   ??? Smoking status: Never Smoker    ??? Smokeless tobacco: Never Used   ??? Alcohol Use: No   ??? Drug Use: No   ??? Sexually Active: Not on file     Other Topics Concern   ??? Not on file  Social History Narrative   ??? No narrative on file       Review of Systems  Pertinent items are noted in HPI.     Objective:   BP 174/69   Pulse 78   Temp(Src) 96.5 ??F (35.8 ??C) (Oral)   Resp 16   Ht 4\' 7"  (1.397 m)   Wt 128 lb 8 oz (58.287 kg)   BMI 29.87 kg/m2   LMP 10/23/1978    General:  alert, cooperative, no distress, appears stated age   Oropharynx: Lips, mucosa, and tongue normal. Teeth and gums normal    Eyes:  conjunctivae/corneas clear. PERRL, EOM's intact. Fundi benign    Ears:  normal TM's and external ear canals AU   Neck: supple, symmetrical, trachea midline, no adenopathy, thyroid: not enlarged, symmetric, no tenderness/mass/nodules, no carotid bruit and no JVD   Thyroid:  no palpable nodule   Lung: clear to auscultation bilaterally   Heart:  regular rate and rhythm, S1, S2 normal, no murmur, click, rub or gallop   Abdomen: soft, non-tender. Bowel sounds normal. No masses,  no organomegaly   Extremities: extremities normal, atraumatic, no cyanosis or edema   Skin: Warm and dry. no hyperpigmentation, vitiligo, or suspicious lesions   Pulses: 2+ and symmetric    Neuro: normal without focal findings  mental status, speech normal, alert and oriented x iii  PERLA  reflexes normal and symmetric     Lab Review  CO2 (mmol/L)   Date Value   12/23/2010 25    07/01/2010 29    04/08/2010 24         BUN (mg/dL)   Date Value   06/23/4781 26    07/01/2010 22*   04/08/2010 25      FBS:  137 post prandial    Assessment:     Diabetes Mellitus type II, under inadeqaute control.  1. Diabetes mellitus  AMB POC GLUCOSE BLOOD, BY GLUCOSE MONITORING DEVICE, REFERRAL TO ENDOCRINOLOGY, glipiZIDE (GLUCOTROL) 10 mg tablet   2. PVD (peripheral vascular disease)  REFERRAL TO VASCULAR SURGERY         Plan:     1.  Rx changes: Glipizide changed to 10 mg po in am and 5 mg in the pm  2.  Education: Reviewed ???ABCs??? of diabetes management (respective goals in parentheses):  A1C (<7), blood pressure (<130/80), and cholesterol (LDL <100).  3.  Compliance at present is estimated to be satisfactory. Efforts to improve compliance (if necessary) will be directed at regular blood sugar monitoring: 2 times daily.  4. Follow up: 1 month

## 2011-03-10 NOTE — Progress Notes (Signed)
Chief Complaint   Patient presents with   ??? Blood sugar problem     flutuating blood sugars

## 2011-03-11 MED ORDER — GLIPIZIDE 10 MG TAB
10 mg | ORAL_TABLET | ORAL | Status: DC
Start: 2011-03-11 — End: 2011-05-05

## 2011-03-13 ENCOUNTER — Encounter

## 2011-03-13 MED ORDER — PREGABALIN 100 MG CAP
100 mg | ORAL_CAPSULE | Freq: Every evening | ORAL | Status: DC
Start: 2011-03-13 — End: 2011-03-27

## 2011-03-14 NOTE — Telephone Encounter (Signed)
Pt daughter call and said that pt is out of lyrica, and needs a refill. Also she is questioning the dosages she is taking 100mg  at night but she said that she thinks that the 75mg  in the am has been dropped and wanted to know why? Also she is questioning the tramodol she was taking it Q4 and it has been dropped to Q6.Marland Kitchen Please call her so she can go over these things with you.

## 2011-03-15 NOTE — Telephone Encounter (Signed)
Called into [facility] @ [phone], Lyrica 100 mg & Glipizide 10 mg as ordered by Dr. Silvio Clayman, because these meds didn't go electronically to pt's pharmacy.

## 2011-03-16 NOTE — Telephone Encounter (Signed)
Pt 's daughter is returning your call. Please call her at work [phone] ext [phone]

## 2011-03-16 NOTE — Telephone Encounter (Signed)
Left message in cell's voicemail for pt's daughter, Ragen Laver, that Dr. Lily Lovings wants pt to still take Lyrica 100 mg @ night.   Because Dr. Lily Lovings  did not prescribe Tramadol for pt, she doesn't know how it was prescribed by another MD.  Dr. Lily Lovings does not want to prescribe this medicine for the pt.

## 2011-03-16 NOTE — Telephone Encounter (Signed)
Chelsea Schultz, daughter wants to know if Lyrica can be changed to 75 mg two times daily, Lortab to every 4 hrs, not every 6 hrs, and should pt continue to take Naprosyn when other changes occur.  Pt is having a lot of nerve pain.

## 2011-03-27 ENCOUNTER — Encounter

## 2011-03-27 MED ORDER — PREGABALIN 75 MG CAP
75 mg | ORAL_CAPSULE | Freq: Two times a day (BID) | ORAL | Status: DC
Start: 2011-03-27 — End: 2011-09-29

## 2011-03-27 MED ORDER — NAPROXEN 500 MG TAB
500 mg | ORAL_TABLET | Freq: Two times a day (BID) | ORAL | Status: DC
Start: 2011-03-27 — End: 2011-09-29

## 2011-03-27 MED ORDER — HYDROCODONE-ACETAMINOPHEN 2.5 MG-500 MG TAB
ORAL_TABLET | Freq: Four times a day (QID) | ORAL | Status: DC | PRN
Start: 2011-03-27 — End: 2011-09-04

## 2011-03-27 NOTE — Telephone Encounter (Signed)
Please call Chelsea Schultz, I have changed her Lyrica to 75 mg po BID and Naprosen.  She cannot take Lortab 7.5 greater than every 4 hours. So I have changed her to 2.5/500 every 4 hours, as needed for pain.

## 2011-03-28 NOTE — Telephone Encounter (Signed)
Scripts mailed to pt.

## 2011-03-28 NOTE — Telephone Encounter (Signed)
Pt's daughter informed, and wants scripts mailed to pt.

## 2011-04-10 NOTE — Telephone Encounter (Signed)
Pt referred to vascular surgeon and endocrinologist, but neither accepted her [facility] insurance and her daughter sched appts for pt with vascularsurgeon, Dr. Illene Silver for 04/24/11 and Endo Dr. Thornton Park for 04/27/11 at [facility].  Pt will need new referrals. Dx is 443.9, PVD and 250.00 DM.

## 2011-04-28 NOTE — Telephone Encounter (Signed)
No authorizations required for patients [employer] per website

## 2011-05-05 MED ORDER — SAXAGLIPTIN 5 MG TAB
5 mg | ORAL_TABLET | Freq: Every day | ORAL | Status: DC
Start: 2011-05-05 — End: 2011-09-29

## 2011-05-05 MED ORDER — METFORMIN 500 MG TAB
500 mg | ORAL_TABLET | Freq: Two times a day (BID) | ORAL | Status: DC
Start: 2011-05-05 — End: 2013-06-13

## 2011-06-22 ENCOUNTER — Encounter

## 2011-06-23 MED ORDER — AMLODIPINE-VALSARTAN 10 MG-320 MG TAB
10-320 mg | ORAL_TABLET | Freq: Every day | ORAL | Status: DC
Start: 2011-06-23 — End: 2011-09-05

## 2011-08-17 ENCOUNTER — Encounter

## 2011-09-04 MED ORDER — PREGABALIN 100 MG CAP
100 mg | ORAL_CAPSULE | Freq: Two times a day (BID) | ORAL | Status: DC
Start: 2011-09-04 — End: 2011-09-05

## 2011-09-04 MED ORDER — HYDROCODONE-ACETAMINOPHEN 2.5 MG-500 MG TAB
ORAL_TABLET | Freq: Four times a day (QID) | ORAL | Status: DC | PRN
Start: 2011-09-04 — End: 2013-06-13

## 2011-09-04 NOTE — Telephone Encounter (Addendum)
[facility] pharmacy requesting refill on pt's Lortab and Lyrica.  Pls call in to pharmacy.

## 2011-09-05 ENCOUNTER — Encounter

## 2011-09-05 MED ORDER — AMLODIPINE-VALSARTAN 10 MG-320 MG TAB
10-320 mg | ORAL_TABLET | Freq: Every day | ORAL | Status: DC
Start: 2011-09-05 — End: 2012-05-23

## 2011-09-05 MED ORDER — PREGABALIN 100 MG CAP
100 mg | ORAL_CAPSULE | Freq: Two times a day (BID) | ORAL | Status: DC
Start: 2011-09-05 — End: 2011-09-29

## 2011-09-05 NOTE — Telephone Encounter (Signed)
Patient's daughter called stating the patient has been out of exforge since Friday and only has two tabs left of the lyrica. Wants both refilled at [facility]

## 2011-09-29 MED ORDER — IBUPROFEN 800 MG TAB
800 mg | ORAL_TABLET | Freq: Three times a day (TID) | ORAL | Status: DC | PRN
Start: 2011-09-29 — End: 2013-06-13

## 2011-09-29 MED ORDER — POTASSIUM CHLORIDE SR 20 MEQ TAB, PARTICLES/CRYSTALS
20 mEq | ORAL_TABLET | Freq: Every day | ORAL | Status: DC
Start: 2011-09-29 — End: 2011-10-03

## 2011-09-29 MED ORDER — FUROSEMIDE 40 MG TAB
40 mg | ORAL_TABLET | Freq: Every day | ORAL | Status: DC
Start: 2011-09-29 — End: 2011-10-03

## 2011-09-29 MED ORDER — PREGABALIN 100 MG CAP
100 mg | ORAL_CAPSULE | Freq: Two times a day (BID) | ORAL | Status: DC
Start: 2011-09-29 — End: 2012-02-15

## 2011-09-29 MED ORDER — SITAGLIPTIN 100 MG TAB
100 mg | ORAL_TABLET | Freq: Every day | ORAL | Status: DC
Start: 2011-09-29 — End: 2012-02-15

## 2011-09-29 NOTE — Progress Notes (Signed)
Chief Complaint   Patient presents with   ??? Leg Pain     & cramping both legs.     REVIEWED RECORD IN PREPARATION FOR VISIT AND HAVE OBTAINED NECESSARY DOCUMENTATION

## 2011-09-29 NOTE — Patient Instructions (Signed)
Trochanteric Bursitis: Exercises  Your Care Instructions  Here are some examples of typical rehabilitation exercises for your condition. Start each exercise slowly. Ease off the exercise if you start to have pain.  Your doctor or physical therapist will tell you when you can start these exercises and which ones will work best for you.  How to do the exercises  Hamstring wall stretch    1. Lie on your back in a doorway, with your good leg through the open door.   2. Slide your affected leg up the wall to straighten your knee. You should feel a gentle stretch down the back of your leg.   ?? Do not arch your back.   ?? Do not bend either knee.   ?? Keep one heel touching the floor and the other heel touching the wall. Do not point your toes.   3. Hold the stretch for at least 1 minute to begin. Then try to lengthen the time you hold the stretch to as long as 6 minutes.   4. Repeat 2 to 4 times.   If you do not have a place to do this exercise in a doorway, there is another way to do it:  1. Lie on your back, and bend the knee of your affected leg.   2. Loop a towel under the ball and toes of that foot, and hold the ends of the towel in your hands.   3. Straighten your knee, and slowly pull back on the towel. You should feel a gentle stretch down the back of your leg.   4. Hold the stretch for 15 to 30 seconds. Or even better, hold the stretch for 1 minute if you can.   5. Repeat 2 to 4 times.   Straight-leg raises to the outside    1. Lie on your side, with your affected leg on top.   2. Tighten the front thigh muscles of your top leg to keep your knee straight.   3. Keep your hip and your leg straight in line with the rest of your body, and keep your knee pointing forward. Do not drop your hip back.   4. Lift your top leg straight up toward the ceiling, about 12 inches off the floor. Hold for about 6 seconds, then slowly lower your leg.   5. Repeat 8 to 12 times.   Clamshell     1. Lie on your side, with your affected leg on top and your head propped on a pillow. Keep your feet and knees together and your knees bent.   2. Raise your top knee, but keep your feet together. Do not let your hips roll back. Your legs should open up like a clamshell.   3. Hold for 6 seconds.   4. Slowly lower your knee back down. Rest for 10 seconds.   5. Repeat 8 to 12 times.   Standing quadriceps stretch    1. If you are not steady on your feet, hold on to a chair, counter, or wall. You can also lie on your stomach or your side to do this exercise.   2. Bend the knee of the leg you want to stretch, and reach behind you to grab the front of your foot or ankle with the hand on the same side. For example, if you are stretching your right leg, use your right hand.   3. Keeping your knees next to each other, pull your foot toward your buttock until you feel   a gentle stretch across the front of your hip and down the front of your thigh. Your knee should be pointed directly to the ground, and not out to the side.   4. Hold the stretch for 15 to 30 seconds.   5. Repeat 2 to 4 times.   Piriformis stretch    1. Lie on your back with your legs straight.   2. Lift your affected leg and bend your knee. With your opposite hand, reach across your body, and then gently pull your knee toward your opposite shoulder.   3. Hold the stretch for 15 to 30 seconds.   4. Repeat 2 to 4 times.   Double knee-to-chest    1. Lie on your back with your knees bent and your feet flat on the floor. You can put a small pillow under your head and neck if it is more comfortable.   2. Bring both knees to your chest.   3. Keep your lower back pressed to the floor. Hold for 15 to 30 seconds.   4. Relax, and lower your knees to the starting position.   5. Repeat 2 to 4 times.    Follow-up care is a key part of your treatment and safety. Be sure to make and go to all appointments, and call your doctor if you are having problems. It's also a good idea to know your test results and keep a list of the medicines you take.    Where can you learn more?    Go to http://www.healthwise.net/[facility]   Enter N503 in the search box to learn more about "Trochanteric Bursitis: Exercises."    ?? 2006-2012 Healthwise, Incorporated. Care instructions adapted under license by [employer] (which disclaims liability or warranty for this information). This care instruction is for use with your licensed healthcare professional. If you have questions about a medical condition or this instruction, always ask your healthcare professional. Healthwise, Incorporated disclaims any warranty or liability for your use of this information.  Content Version: 9.4.94723; Last Revised: November 17, 2010

## 2011-10-01 NOTE — Progress Notes (Signed)
HISTORY OF PRESENT ILLNESS  Chelsea Schultz is a 77 y.o. female.  HPI  1.  Bursitis  Patient complains of bursitis. Patient has a long standing history of pain, tenderness in the area of the right hip.  Onset was gradual. The symptoms are of moderate severity. They are made worse by: lying down, walking. They are helped by Physical therapy, but she has not attended for several months now. Previous treatments include Physical Therapy and Lyrica. Limitation on activities include none. Associated symptoms include morning stiffness, joint pain. Patient denies associated fevers, fatigue, memory loss.    Current Outpatient Prescriptions   Medication Sig Dispense Refill   ??? sitaGLIPtin (JANUVIA) 100 mg tablet Take 1 Tab by mouth daily.  30 Tab  3   ??? furosemide (LASIX) 40 mg tablet Take 1 Tab by mouth daily.  30 Tab  4   ??? pregabalin (LYRICA) 100 mg capsule Take 1 Cap by mouth two (2) times a day.  30 Cap  3   ??? potassium chloride (K-DUR, KLOR-CON) 20 mEq tablet Take 1 Tab by mouth daily.  30 Tab  4   ??? ibuprofen (MOTRIN) 800 mg tablet Take 1 Tab by mouth every eight (8) hours as needed for Pain.  30 Tab  2   ??? amLODIPine-valsartan (EXFORGE) 10-320 mg per tablet Take 1 Tab by mouth daily.  90 Tab  3   ??? HYDROcodone-acetaminophen (LORTAB) 2.5-500 mg per tablet Take 1 Tab by mouth every six (6) hours as needed for Pain.  30 Tab  2   ??? metFORMIN (GLUCOPHAGE) 500 mg tablet Take 2 Tabs by mouth two (2) times daily (with meals).  120 Tab  3   ??? calcium-cholecalciferol, D3, (CALCIUM 600 + D) tablet Take 2 Tabs by mouth daily.  60 Tab  12   ??? Comp.Stocking,Knee,Regular,Lrg Misc 1 Each by Does Not Apply route daily.  1 Each  3   ??? Psyllium Husk-Sucrose (METAMUCIL) 3.4 gram/7 gram Powd Take 1 Cap by mouth daily.  368 g  3      History     Social History   ??? Marital Status: Unknown     Spouse Name: N/A     Number of Children: N/A   ??? Years of Education: N/A     Social History Main Topics   ??? Smoking status: Never Smoker     ??? Smokeless tobacco: Never Used   ??? Alcohol Use: No   ??? Drug Use: No   ??? Sexually Active: Not on file     Other Topics Concern   ??? Not on file     Social History Narrative   ??? No narrative on file      Family History   Problem Relation Age of Onset   ??? Diabetes Mother    ??? Hypertension Father      No Known Allergies   Past Medical History   Diagnosis Date   ??? Diabetes    ??? Hypertension    ??? Neuropathy       Blood pressure 151/63, pulse 77, temperature 96.4 ??F (35.8 ??C), temperature source Oral, resp. rate 16, height 4\' 7"  (1.397 m), weight 118 lb 8 oz (53.751 kg), last menstrual period 10/23/1978.     Review of Systems   Constitutional: Negative for weight loss and malaise/fatigue.   HENT: Negative for hearing loss, ear pain, congestion, sore throat and ear discharge.    Eyes: Negative.  Negative for blurred vision and double vision.  Respiratory: Negative for cough, sputum production and shortness of breath.    Cardiovascular: Negative.  Negative for chest pain and palpitations.   Gastrointestinal: Negative for heartburn, nausea, vomiting, abdominal pain and constipation.   Genitourinary: Negative for dysuria and urgency.   Musculoskeletal: Positive for joint pain (lower leg cramps). Negative for myalgias.   Skin: Negative for rash.   Neurological: Negative for dizziness, tremors, loss of consciousness and headaches.   Endo/Heme/Allergies: Negative for polydipsia.   Psychiatric/Behavioral: Negative for depression and suicidal ideas. The patient does not have insomnia.        Physical Exam   Nursing note and vitals reviewed.  Constitutional: She is oriented to person, place, and time. She appears well-developed and well-nourished.   HENT:   Head: Normocephalic and atraumatic.   Mouth/Throat: Oropharynx is clear and moist.   Eyes: Conjunctivae and EOM are normal. Pupils are equal, round, and reactive to light.   Neck: Normal range of motion. Neck supple.    Cardiovascular: Normal rate, regular rhythm and normal heart sounds.  Exam reveals no gallop and no friction rub.    No murmur heard.  Pulmonary/Chest: Effort normal and breath sounds normal. No respiratory distress. She has no wheezes. She has no rales.   Abdominal: Soft. Bowel sounds are normal. She exhibits no distension. There is no tenderness.   Musculoskeletal: She exhibits no edema and no tenderness.   Neurological: She is alert and oriented to person, place, and time. She has normal reflexes. No cranial nerve deficit. Coordination normal.   Skin: Skin is warm and dry.   Psychiatric: She has a normal mood and affect. Her behavior is normal. Thought content normal.       ASSESSMENT and PLAN  1. Greater trochanteric bursitis of right hip  pregabalin (LYRICA) 100 mg capsule, ibuprofen (MOTRIN) 800 mg tablet   2. HTN (hypertension)  METABOLIC PANEL, COMPREHENSIVE, CBC WITH AUTOMATED DIFF, TSH, 3RD GENERATION, furosemide (LASIX) 40 mg tablet, potassium chloride (K-DUR, KLOR-CON) 20 mEq tablet   3. DM (diabetes mellitus)  METABOLIC PANEL, COMPREHENSIVE, HEMOGLOBIN A1C, sitaGLIPtin (JANUVIA) 100 mg tablet   4. Back pain with radiation  pregabalin (LYRICA) 100 mg capsule, ibuprofen (MOTRIN) 800 mg tablet   5. Elevated cholesterol  NMR LIPOPROFILE     Follow-up Disposition:  Return in about 3 months (around 12/28/2011), or if symptoms worsen or fail to improve.    Apply a compressive ACE bandage. Rest and elevate the affected painful area.  Apply cold compresses intermittently as needed.  As pain recedes, begin normal activities slowly as tolerated.  Call if symptoms persist.

## 2011-10-03 ENCOUNTER — Encounter

## 2011-10-03 MED ORDER — FUROSEMIDE 40 MG TAB
40 mg | ORAL_TABLET | Freq: Every day | ORAL | Status: DC
Start: 2011-10-03 — End: 2012-05-23

## 2011-10-03 MED ORDER — POTASSIUM CHLORIDE SR 20 MEQ TAB, PARTICLES/CRYSTALS
20 mEq | ORAL_TABLET | Freq: Every day | ORAL | Status: DC
Start: 2011-10-03 — End: 2012-10-31

## 2011-10-03 NOTE — Telephone Encounter (Addendum)
Pls send refills to local [facility] pharmacy.  Pt requesting 90 day supply.

## 2011-12-13 LAB — CBC WITH AUTOMATED DIFF
ABS. BASOPHILS: 0 10*3/uL (ref 0.0–0.2)
ABS. EOSINOPHILS: 0.4 10*3/uL (ref 0.0–0.4)
ABS. IMM. GRANS.: 0 10*3/uL (ref 0.0–0.1)
ABS. MONOCYTES: 1 10*3/uL (ref 0.1–1.0)
ABS. NEUTROPHILS: 4.5 10*3/uL (ref 1.8–7.8)
Abs Lymphocytes: 1.8 10*3/uL (ref 0.7–4.5)
BASOPHILS: 1 % (ref 0–3)
EOSINOPHILS: 5 % (ref 0–7)
HCT: 37.4 % (ref 34.0–46.6)
HGB: 11.9 g/dL (ref 11.1–15.9)
IMMATURE GRANULOCYTES: 0 % (ref 0–2)
Lymphocytes: 23 % (ref 14–46)
MCH: 27.2 pg (ref 26.6–33.0)
MCHC: 31.8 g/dL (ref 31.5–35.7)
MCV: 85 fL (ref 79–97)
MONOCYTES: 13 % (ref 4–13)
NEUTROPHILS: 58 % (ref 40–74)
PLATELET: 278 10*3/uL (ref 140–415)
RBC: 4.38 x10E6/uL (ref 3.77–5.28)
RDW: 14.3 % (ref 12.3–15.4)
WBC: 7.7 10*3/uL (ref 4.0–10.5)

## 2011-12-13 LAB — METABOLIC PANEL, COMPREHENSIVE
A-G Ratio: 1.4 (ref 1.1–2.5)
ALT (SGPT): 12 IU/L (ref 0–32)
AST (SGOT): 18 IU/L (ref 0–40)
Albumin: 3.9 g/dL (ref 3.5–4.7)
Alk. phosphatase: 60 IU/L (ref 25–165)
BUN/Creatinine ratio: 21 (ref 11–26)
BUN: 15 mg/dL (ref 8–27)
Bilirubin, total: 0.2 mg/dL (ref 0.0–1.2)
CO2: 22 mmol/L (ref 20–32)
Calcium: 9.6 mg/dL (ref 8.6–10.2)
Chloride: 105 mmol/L (ref 97–108)
Creatinine: 0.7 mg/dL (ref 0.57–1.00)
GFR est non-AA: 80 mL/min/{1.73_m2} (ref >59)
GLOBULIN, TOTAL: 2.7 g/dL (ref 1.5–4.5)
Glucose: 93 mg/dL (ref 65–99)
Potassium: 4 mmol/L (ref 3.5–5.2)
Protein, total: 6.6 g/dL (ref 6.0–8.5)
Sodium: 138 mmol/L (ref 134–144)
eGFR If African American: 93 mL/min/{1.73_m2} (ref >59)

## 2011-12-13 LAB — HEMOGLOBIN A1C WITH EAG: Hemoglobin A1c: 6.3 % — ABNORMAL HIGH (ref 4.8–5.6)

## 2011-12-13 LAB — TSH 3RD GENERATION: TSH: 2.51 u[IU]/mL (ref 0.450–4.500)

## 2011-12-13 MED ORDER — OMEPRAZOLE 20 MG TAB, DELAYED RELEASE
20 mg | ORAL_TABLET | Freq: Every day | ORAL | Status: DC
Start: 2011-12-13 — End: 2012-05-23

## 2011-12-13 NOTE — Progress Notes (Signed)
Chief Complaint   Patient presents with   ??? Labs     review lab results   ??? Medication Evaluation     review meds

## 2011-12-13 NOTE — Progress Notes (Signed)
HISTORY OF PRESENT ILLNESS  Chelsea Schultz is a 78 y.o. female.  HPI  1. Here to review labs and medication evaluation  Results for orders placed in visit on 09/29/11   NMR LIPOPROFILE     Status: Abnormal       Component Value Range Status Comment    LDL-P 1295 (*) <1000 nmol/L Final     LDL-C 110 (*) <100 mg/dL Final     HDL-C 57  >=47 mg/dL Final     Triglycerides 44  <150 mg/dL Final     Cholesterol, Total 176  <200 mg/dL Final     HDL-P (Total) 33.8  >=30.5 umol/L Final     Small LDL-P 104  <=527 nmol/L Final     LDL size 21.1  >20.5 nm Final     LP-IR SCORE 10  <=45 Final        Lab Results   Component Value Date/Time    Hemoglobin A1c 6.3 12/12/2011  8:51 AM     Lab Results   Component Value Date/Time    Sodium 138 12/12/2011  8:51 AM    Potassium 4.0 12/12/2011  8:51 AM    Chloride 105 12/12/2011  8:51 AM    CO2 22 12/12/2011  8:51 AM    Anion gap 3 07/01/2010  6:10 PM    Glucose 93 12/12/2011  8:51 AM    BUN 15 12/12/2011  8:51 AM    Creatinine 0.70 12/12/2011  8:51 AM    BUN/Creatinine ratio 21 12/12/2011  8:51 AM    GFR est AA 71 12/23/2010  4:15 PM    GFR est non-AA 80 12/12/2011  8:51 AM    Calcium 9.6 12/12/2011  8:51 AM    Bilirubin, total 0.2 12/12/2011  8:51 AM    ALT 12 12/12/2011  8:51 AM    AST 18 12/12/2011  8:51 AM    Alk. phosphatase 60 12/12/2011  8:51 AM    Protein, total 6.6 12/12/2011  8:51 AM    Albumin 3.9 12/12/2011  8:51 AM    Globulin 4.0 07/01/2010  6:10 PM    A-G Ratio 1.4 12/12/2011  8:51 AM     2. Chest Pain  Patient complains of chest pain. Onset was 5 days ago, with unchanged course since that time.  The patient admits to chest discomfort that is intermittent, with radiation to none, rated as a scale of 3/10 in intensity that is pressure in nature.  Associated symptoms are dyspnea on exertion. Aggravating factors are housework.  Alleviating factors are: rest. Patient's cardiac risk factors are dyslipidemia, diabetes mellitus, post-menopausal.  Patient's risk factors for DVT/PE: none. Previous  cardiac testing includes: none.      Current Outpatient Prescriptions   Medication Sig Dispense Refill   ??? furosemide (LASIX) 40 mg tablet Take 1 Tab by mouth daily.  90 Tab  1   ??? potassium chloride (K-DUR, KLOR-CON) 20 mEq tablet Take 1 Tab by mouth daily.  90 Tab  1   ??? sitaGLIPtin (JANUVIA) 100 mg tablet Take 1 Tab by mouth daily.  30 Tab  3   ??? pregabalin (LYRICA) 100 mg capsule Take 1 Cap by mouth two (2) times a day.  30 Cap  3   ??? ibuprofen (MOTRIN) 800 mg tablet Take 1 Tab by mouth every eight (8) hours as needed for Pain.  30 Tab  2   ??? amLODIPine-valsartan (EXFORGE) 10-320 mg per tablet Take 1 Tab by mouth daily.  90  Tab  3   ??? HYDROcodone-acetaminophen (LORTAB) 2.5-500 mg per tablet Take 1 Tab by mouth every six (6) hours as needed for Pain.  30 Tab  2   ??? metFORMIN (GLUCOPHAGE) 500 mg tablet Take 2 Tabs by mouth two (2) times daily (with meals).  120 Tab  3   ??? Comp.Stocking,Knee,Regular,Lrg Misc 1 Each by Does Not Apply route daily.  1 Each  3   ??? calcium-cholecalciferol, D3, (CALCIUM 600 + D) tablet Take 2 Tabs by mouth daily.  60 Tab  12   ??? Psyllium Husk-Sucrose (METAMUCIL) 3.4 gram/7 gram Powd Take 1 Cap by mouth daily.  368 g  3      History     Social History   ??? Marital Status: UNKNOWN     Spouse Name: N/A     Number of Children: N/A   ??? Years of Education: N/A     Social History Main Topics   ??? Smoking status: Never Smoker    ??? Smokeless tobacco: Never Used   ??? Alcohol Use: No   ??? Drug Use: No   ??? Sexually Active: Not on file     Other Topics Concern   ??? Not on file     Social History Narrative   ??? No narrative on file      Family History   Problem Relation Age of Onset   ??? Diabetes Mother    ??? Hypertension Father      No Known Allergies   Past Medical History   Diagnosis Date   ??? Diabetes    ??? Hypertension    ??? Neuropathy       Blood pressure 164/82, pulse 77, temperature 95.4 ??F (35.2 ??C), temperature source Oral, resp. rate 20, height 4\' 7"  (1.397 m), weight 121 lb 8 oz (55.112 kg), last  menstrual period 10/23/1978.     Review of Systems   Constitutional: Negative for weight loss and malaise/fatigue.   HENT: Negative for hearing loss, ear pain, congestion, sore throat and ear discharge.    Eyes: Negative.  Negative for blurred vision and double vision.   Respiratory: Negative for cough, sputum production and shortness of breath.    Cardiovascular: Negative.  Negative for chest pain and palpitations.   Gastrointestinal: Negative for heartburn, nausea, vomiting, abdominal pain and constipation.   Genitourinary: Negative for dysuria and urgency.   Musculoskeletal: Positive for joint pain (lower leg cramps). Negative for myalgias.   Skin: Negative for rash.   Neurological: Negative for dizziness, tremors, loss of consciousness and headaches.   Endo/Heme/Allergies: Negative for polydipsia.   Psychiatric/Behavioral: Negative for depression and suicidal ideas. The patient does not have insomnia.        Physical Exam   Blood pressure 164/82, pulse 77, temperature 95.4 ??F (35.2 ??C), temperature source Oral, resp. rate 20, height 4\' 7"  (1.397 m), weight 121 lb 8 oz (55.112 kg), last menstrual period 10/23/1978.    Constitutional: She is oriented to person, place, and time. She appears well-developed and well-nourished.   HENT:   Head: Normocephalic and atraumatic.   Mouth/Throat: Oropharynx is clear and moist.   Eyes: Conjunctivae and EOM are normal. Pupils are equal, round, and reactive to light.   Neck: Normal range of motion. Neck supple.   Cardiovascular: Normal rate, regular rhythm and normal heart sounds.  Exam reveals no gallop and no friction rub.    No murmur heard.  Pulmonary/Chest: Effort normal and breath sounds normal. No respiratory distress. She has  no wheezes. She has no rales.   Abdominal: Soft. Bowel sounds are normal. She exhibits no distension. There is no tenderness.   Musculoskeletal: She exhibits no edema and no tenderness.   Neurological: She is alert and oriented to person, place, and  time. She has normal reflexes. No cranial nerve deficit. Coordination normal.   Skin: Skin is warm and dry.   Psychiatric: She has a normal mood and affect. Her behavior is normal. Thought content normal.     EKG:  NSR  ASSESSMENT and PLAN  1. Shortness of breath  AMB POC EKG ROUTINE W/ 12 LEADS, INTER & REP   2. GERD (gastroesophageal reflux disease)  Omeprazole delayed release (PRILOSEC D/R) 20 mg tablet       If you experience chest pain call 911. Do not drive yourself.

## 2011-12-13 NOTE — Patient Instructions (Signed)
Gastroesophageal Reflux Disease (GERD): After Your Visit  Your Care Instructions  Gastroesophageal reflux disease (GERD) is the backward flow of stomach acid into the esophagus. The esophagus is the tube that leads from your throat to your stomach. A one-way valve prevents the stomach acid from moving up into this tube. When you have GERD, this valve does not close tightly enough.  If you have mild GERD symptoms including heartburn, you may be able to control the problem with antacids or over-the-counter medicine. Changing your diet, losing weight, and making other lifestyle changes can also help reduce symptoms.  Follow-up care is a key part of your treatment and safety. Be sure to make and go to all appointments, and call your doctor if you are having problems. It???s also a good idea to know your test results and keep a list of the medicines you take.  How can you care for yourself at home?  ?? Take your medicines exactly as prescribed. Call your doctor if you think you are having a problem with your medicine.   ?? Your doctor may recommend over-the-counter medicine. For mild or occasional indigestion, antacids, such as Tums, Gaviscon, Mylanta, or Maalox, may help. Your doctor also may recommend over-the-counter acid reducers, such as Pepcid AC, Tagamet HB, Zantac 75, or Prilosec. Read and follow all instructions on the label. If you use these medicines often, talk with your doctor.   ?? Change your eating habits.   ?? It???s best to eat several small meals instead of two or three large meals.   ?? After you eat, wait 2 to 3 hours before you lie down.   ?? Chocolate, mint, and alcohol can make GERD worse.   ?? Spicy foods, foods that have a lot of acid (like tomatoes and oranges), and coffee can make GERD symptoms worse in some people. If your symptoms are worse after you eat a certain food, you may want to stop eating that food to see if your symptoms get better.   ?? Do not smoke or chew tobacco. Smoking can make GERD  worse. If you need help quitting, talk to your doctor about stop-smoking programs and medicines. These can increase your chances of quitting for good.   ?? If you have GERD symptoms at night, raise the head of your bed 6 to 8 inches by putting the frame on blocks or placing a foam wedge under the head of your mattress. (Adding extra pillows does not work.)   ?? Do not wear tight clothing around your middle.   ?? Lose weight if you need to. Losing just 5 to 10 pounds can help.   When should you call for help?  Call your doctor now or seek immediate medical care if:  ?? You have new or different belly pain.   ?? Your stools are black and tarlike or have streaks of blood.   Watch closely for changes in your health, and be sure to contact your doctor if:  ?? Your symptoms have not improved after 2 days.   ?? Food seems to catch in your throat or chest.     Where can you learn more?    Go to http://www.healthwise.net/[facility]   Enter T927 in the search box to learn more about "Gastroesophageal Reflux Disease (GERD): After Your Visit."    ?? 2006-2012 Healthwise, Incorporated. Care instructions adapted under license by [employer] (which disclaims liability or warranty for this information). This care instruction is for use with your licensed healthcare professional.   If you have questions about a medical condition or this instruction, always ask your healthcare professional. Healthwise, Incorporated disclaims any warranty or liability for your use of this information.  Content Version: 9.5.76532; Last Revised: January 06, 2010

## 2011-12-14 LAB — NMR LIPOPROFILE WITH LIPIDS (WITHOUT GRAPH)
Cholesterol, Total: 176 mg/dL (ref <200)
HDL-C: 57 mg/dL (ref >=40)
HDL-P (Total): 33.8 umol/L (ref >=30.5)
LDL size: 21.1 nm (ref >20.5)
LDL-C: 110 mg/dL — ABNORMAL HIGH (ref <100)
LDL-P: 1295 nmol/L — ABNORMAL HIGH (ref <1000)
LP-IR SCORE: 10 (ref <=45)
Small LDL-P: 104 nmol/L (ref <=527)
Triglycerides: 44 mg/dL (ref <150)

## 2011-12-14 MED ORDER — OMEPRAZOLE 20 MG CAP, DELAYED RELEASE
20 mg | ORAL_CAPSULE | ORAL | Status: DC
Start: 2011-12-14 — End: 2012-05-23

## 2012-02-15 ENCOUNTER — Encounter

## 2012-02-15 NOTE — Telephone Encounter (Signed)
Refill request received from [facility]

## 2012-02-16 MED ORDER — SITAGLIPTIN 100 MG TAB
100 mg | ORAL_TABLET | Freq: Every day | ORAL | Status: DC
Start: 2012-02-16 — End: 2012-08-02

## 2012-02-16 MED ORDER — PREGABALIN 100 MG CAP
100 mg | ORAL_CAPSULE | Freq: Two times a day (BID) | ORAL | Status: DC
Start: 2012-02-16 — End: 2012-08-26

## 2012-02-20 ENCOUNTER — Encounter

## 2012-02-21 NOTE — Telephone Encounter (Signed)
Pt's Lyrica RX has been called in per orders.

## 2012-02-21 NOTE — Telephone Encounter (Signed)
Pt's Lyrica Rx has been called in per orders.

## 2012-02-21 NOTE — Telephone Encounter (Signed)
From: Codner,BLANCHIE L   To: Angeline Slim, MD   Sent: Tue Feb 20, 2012 7:49 PM   Subject: Medication Renewal Request    Original authorizing provider: Angeline Slim, MD    Estanislado Spire would like a refill of the following medications:  pregabalin (LYRICA) 100 mg capsule [Bernice Ernie Avena, MD]    Preferred pharmacy: [facility] [address] ([place]    Comment:  Dr. Lily Lovings, Please call or fax this order into [facility], apparently you are not set up to do an e-scribe for controlled substances, per [facility]. They show no order received for this med. Order for Ardeen Fillers was received and has been picked up.  Thanks, [employer]

## 2012-04-02 MED ORDER — SODIUM CHLORIDE 0.9 % IJ SYRG
Freq: Three times a day (TID) | INTRAMUSCULAR | Status: DC
Start: 2012-04-02 — End: 2012-04-03

## 2012-04-02 MED ORDER — SODIUM CHLORIDE 0.9 % IJ SYRG
INTRAMUSCULAR | Status: DC | PRN
Start: 2012-04-02 — End: 2012-04-03

## 2012-04-02 NOTE — ED Notes (Signed)
Verbal shift change report given to Huntley Dec (Cabin crew) by Maralyn Sago (offgoing nurse).  Report given with SBAR and ED Summary.

## 2012-04-02 NOTE — ED Provider Notes (Signed)
HPI Comments: 78 year old female states she has a h/o spinal stenosis states that she has been offered surgery but has refused int he past per daughter increased weakness today states that she is unable to walk and had to be held up patietn has some regular incontence of stool     Patient is a 78 y.o. female presenting with lethargy. The history is provided by the patient.   Lethargy  This is a new problem. The current episode started 12 to 24 hours ago. The problem occurs constantly. The problem has not changed since onset.Pertinent negatives include no chest pain, no abdominal pain and no headaches. Nothing aggravates the symptoms. Nothing relieves the symptoms. She has tried nothing for the symptoms.        Past Medical History   Diagnosis Date   ??? Diabetes    ??? Hypertension    ??? Neuropathy    ??? Spinal stenosis         History reviewed. No pertinent past surgical history.      Family History   Problem Relation Age of Onset   ??? Diabetes Mother    ??? Hypertension Father         History     Social History   ??? Marital Status: UNKNOWN     Spouse Name: N/A     Number of Children: N/A   ??? Years of Education: N/A     Occupational History   ??? Not on file.     Social History Main Topics   ??? Smoking status: Never Smoker    ??? Smokeless tobacco: Never Used   ??? Alcohol Use: No   ??? Drug Use: No   ??? Sexually Active: Not on file     Other Topics Concern   ??? Not on file     Social History Narrative   ??? No narrative on file                  ALLERGIES: Review of patient's allergies indicates no known allergies.      Review of Systems   Constitutional: Positive for fatigue. Negative for appetite change and unexpected weight change.   HENT: Negative for facial swelling, rhinorrhea, trouble swallowing, neck pain, dental problem and ear discharge.    Eyes: Negative for pain and discharge.   Respiratory: Negative for apnea and stridor.    Cardiovascular: Negative for chest pain, palpitations and leg swelling.   Gastrointestinal: Negative  for vomiting, abdominal pain, diarrhea, blood in stool and abdominal distention.   Genitourinary: Negative for dysuria, hematuria, flank pain and difficulty urinating.   Musculoskeletal: Negative for myalgias, back pain, joint swelling and arthralgias.   Skin: Negative for color change, rash and wound.   Neurological: Positive for weakness. Negative for facial asymmetry, speech difficulty, numbness and headaches.   Hematological: Negative for adenopathy.   Psychiatric/Behavioral: Negative for suicidal ideas, hallucinations, behavioral problems, self-injury and agitation.       Filed Vitals:    04/02/12 1830 04/02/12 1948 04/02/12 1950 04/02/12 2100   BP: 132/68 164/72  149/73   Pulse: 78      Temp: 98.3 ??F (36.8 ??C)      Resp: 18      Height: 4\' 7"  (1.397 m)      Weight: 55.339 kg (122 lb)      SpO2: 98%  98% 95%            Physical Exam   Nursing note and vitals reviewed.  Constitutional: She is oriented to person, place, and time. She appears well-developed and well-nourished. She appears distressed.   HENT:   Head: Normocephalic and atraumatic.   Eyes: Conjunctivae and EOM are normal. Pupils are equal, round, and reactive to light. Right eye exhibits no discharge. Left eye exhibits no discharge. No scleral icterus.   Neck: Normal range of motion.   Cardiovascular: Normal rate, regular rhythm and normal heart sounds.    Pulmonary/Chest: Effort normal and breath sounds normal. No respiratory distress. She has no wheezes.   Abdominal: Soft. Bowel sounds are normal. She exhibits no distension. There is no tenderness.   Genitourinary: Guaiac negative stool.        Slight decrease sphincter tone noted  Decrease sharp sensation to feet bilaterally   Musculoskeletal: Normal range of motion. She exhibits no edema and no tenderness.   Neurological: She is alert and oriented to person, place, and time. No cranial nerve deficit. Coordination normal.        Good bilateral reflexes noted    Skin: Skin is warm. No erythema.         MDM     Differential Diagnosis; Clinical Impression; Plan:     DDX: increasing spinal stenosis, uti, electrolyte abnormality noted  Amount and/or Complexity of Data Reviewed:   Clinical lab tests:  Ordered and reviewed  Tests in the radiology section of CPT??:  Ordered and reviewed  Discussion of test results with the performing providers:  Yes      Procedures    ED EKG interpretation:  Rhythm: normal sinus rhythm; and regular . Rate (approx.): 69; Axis: normal; P wave: normal; QRS interval: normal ; ST/T wave: non-specific changes;  This EKG was interpreted by Vassie Moment, PA,ED Provider.      Attending spoke with patient and family and the want her near her neurosurgeon    10:24 PM  Spoke with Dr Lyn Hollingshead. Will see pt. Then spoke to patient and daughter who want to be transferred to [facility] because that is where dr singleton is located. Awaiting their transfer center to call back Pierre Bali, MD    10:52 PM  . Just spoke with Dr Pietro Cassis internist. She accepted pt and will all dr singleton in am  For consult . Dr Lyn Hollingshead aware of change of plans Pierre Bali, MD

## 2012-04-02 NOTE — ED Provider Notes (Signed)
I personally saw and examined the patient.  I have reviewed and agree with the MLP's findings, including all diagnostic interpretations, and plans as written.   I was present during the key portions of separately billed procedures.    Shakeena Kafer A Camil Wilhelmsen, MD

## 2012-04-02 NOTE — ED Notes (Signed)
Pt daughter states she does not want pt transferred to [facility].  States wants to go to [place].  MD notified.  MD states she has spoken to Dr Lyn Hollingshead who is oncall for [facility] and if pt wants to go to [place] we can transfer to [facility] ED.  Pt daughter states she wants to be discharged and will call MD in morning.  Dr Maricela Curet notified and in to speak with patient.

## 2012-04-02 NOTE — ED Notes (Signed)
TRANSFER - OUT REPORT:    Verbal report given to [employer] RN(name) on Estanislado Spire  being transferred to to [facility](unit) for routine progression of care       Report consisted of patient???s Situation, Background, Assessment and   Recommendations(SBAR).     Information from the following report(s) SBAR, ED Summary, [facility] and Recent Results was reviewed with the receiving nurse.    Opportunity for questions and clarification was provided.

## 2012-04-02 NOTE — ED Notes (Signed)
Triage note: increasing weakness x 1 day

## 2012-04-02 NOTE — ED Notes (Signed)
PVR 36ml.

## 2012-04-03 LAB — EKG, 12 LEAD, INITIAL
Atrial Rate: 69 {beats}/min
Calculated P Axis: 44 degrees
Calculated R Axis: 20 degrees
Calculated T Axis: 17 degrees
Diagnosis: NORMAL
P-R Interval: 160 ms
Q-T Interval: 392 ms
QRS Duration: 72 ms
QTC Calculation (Bezet): 420 ms
Ventricular Rate: 69 {beats}/min

## 2012-04-03 LAB — CBC WITH AUTOMATED DIFF
ABS. BASOPHILS: 0 10*3/uL (ref 0.0–0.1)
ABS. EOSINOPHILS: 0.3 10*3/uL (ref 0.0–0.4)
ABS. LYMPHOCYTES: 2.3 10*3/uL (ref 0.8–3.5)
ABS. MONOCYTES: 1.2 10*3/uL — ABNORMAL HIGH (ref 0.0–1.0)
ABS. NEUTROPHILS: 4.6 10*3/uL (ref 1.8–8.0)
BASOPHILS: 0 % (ref 0–1)
EOSINOPHILS: 4 % (ref 0–7)
HCT: 35.7 % (ref 35.0–47.0)
HGB: 11.8 g/dL (ref 11.5–16.0)
LYMPHOCYTES: 27 % (ref 12–49)
MCH: 27.6 PG (ref 26.0–34.0)
MCHC: 33.1 g/dL (ref 30.0–36.5)
MCV: 83.4 FL (ref 80.0–99.0)
MONOCYTES: 14 % — ABNORMAL HIGH (ref 5–13)
NEUTROPHILS: 55 % (ref 32–75)
PLATELET: 254 10*3/uL (ref 150–400)
RBC: 4.28 M/uL (ref 3.80–5.20)
RDW: 13.5 % (ref 11.5–14.5)
WBC: 8.3 10*3/uL (ref 3.6–11.0)

## 2012-04-03 LAB — URINALYSIS W/ REFLEX CULTURE
Bilirubin: NEGATIVE
Blood: NEGATIVE
Glucose: NEGATIVE MG/DL
Ketone: NEGATIVE MG/DL
Nitrites: POSITIVE — AB
Protein: NEGATIVE MG/DL
Specific gravity: 1.016 (ref 1.003–1.030)
Urobilinogen: 0.2 EU/DL (ref 0.2–1.0)
pH (UA): 6.5 (ref 5.0–8.0)

## 2012-04-03 LAB — METABOLIC PANEL, COMPREHENSIVE
A-G Ratio: 0.9 — ABNORMAL LOW (ref 1.1–2.2)
ALT (SGPT): 21 U/L (ref 12–78)
AST (SGOT): 22 U/L (ref 15–37)
Albumin: 3.5 g/dL (ref 3.5–5.0)
Alk. phosphatase: 89 U/L (ref 50–136)
Anion gap: 5 mmol/L (ref 5–15)
BUN/Creatinine ratio: 22 — ABNORMAL HIGH (ref 12–20)
BUN: 18 MG/DL (ref 6–20)
Bilirubin, total: 0.2 MG/DL (ref 0.2–1.0)
CO2: 30 MMOL/L (ref 21–32)
Calcium: 9.5 MG/DL (ref 8.5–10.1)
Chloride: 104 MMOL/L (ref 97–108)
Creatinine: 0.81 MG/DL (ref 0.45–1.15)
GFR est AA: >60 mL/min/{1.73_m2} (ref >60)
GFR est non-AA: >60 mL/min/{1.73_m2} (ref >60)
Globulin: 4.1 g/dL — ABNORMAL HIGH (ref 2.0–4.0)
Glucose: 107 MG/DL — ABNORMAL HIGH (ref 65–100)
Potassium: 4.2 MMOL/L (ref 3.5–5.1)
Protein, total: 7.6 g/dL (ref 6.4–8.2)
Sodium: 139 MMOL/L (ref 136–145)

## 2012-04-03 LAB — POC TROPONIN-I: Troponin-I (POC): <0.04 ng/mL (ref 0.00–0.08)

## 2012-04-03 MED ORDER — SODIUM CHLORIDE 0.9 % IV PIGGY BACK
1 gram | INTRAVENOUS | Status: AC
Start: 2012-04-03 — End: 2012-04-02
  Administered 2012-04-03: 02:00:00 via INTRAVENOUS

## 2012-04-03 MED FILL — CEFTRIAXONE 1 GRAM SOLUTION FOR INJECTION: 1 gram | INTRAMUSCULAR | Qty: 1

## 2012-04-03 NOTE — ED Notes (Signed)
[facility] here to pick up pt. Pt. Is being transferred to [facility].

## 2012-04-04 LAB — CULTURE, URINE
Colonies Counted: >100000
Colony Count: >100000

## 2012-05-21 ENCOUNTER — Encounter

## 2012-05-23 LAB — AMB POC URINALYSIS DIP STICK AUTO W/ MICRO
Bilirubin (UA POC): NEGATIVE
Blood (UA POC): NEGATIVE
Glucose (UA POC): NEGATIVE
Ketones (UA POC): NEGATIVE
Leukocyte esterase (UA POC): NEGATIVE
Nitrites (UA POC): POSITIVE
Specific gravity (UA POC): 1.02 (ref 1.001–1.035)
Urobilinogen (UA POC): 0.2 (ref 0.2–1)
pH (UA POC): 6 (ref 4.6–8.0)

## 2012-05-23 MED ORDER — OMEPRAZOLE 20 MG CAP, DELAYED RELEASE
20 mg | ORAL_CAPSULE | Freq: Every day | ORAL | Status: DC
Start: 2012-05-23 — End: 2012-09-09

## 2012-05-23 MED ORDER — NITROFURANTOIN (25% MACROCRYSTAL FORM) 100 MG CAP
100 mg | ORAL_CAPSULE | Freq: Two times a day (BID) | ORAL | Status: DC
Start: 2012-05-23 — End: 2012-05-28

## 2012-05-23 NOTE — Progress Notes (Signed)
Chief Complaint   Patient presents with   ??? Medication Evaluation     wants to go over changes in meds after lumbarectomy.   ??? Fatigue   ??? Hip Pain     L hip pain-5/10,  when walking, but no pain presently.     REVIEWED RECORD IN PREPARATION FOR VISIT AND HAVE OBTAINED NECESSARY DOCUMENTATION

## 2012-05-23 NOTE — Patient Instructions (Addendum)
Fatigue: After Your Visit  Your Care Instructions  Fatigue is a feeling of tiredness, exhaustion, or lack of energy. You may feel fatigue because of too much or not enough activity. It can also come from stress, lack of sleep, boredom, and poor diet. Many medical problems, including viral infections, can cause fatigue. Emotional problems, especially depression, are often the cause of fatigue.  Fatigue is usually a symptom of another problem. Treatment for fatigue depends on the cause. For example, if you have fatigue because you have a certain health condition, treating this condition also treats your fatigue. If depression or anxiety is the cause, treatment may help.  Follow-up care is a key part of your treatment and safety. Be sure to make and go to all appointments, and call your doctor if you are having problems. It???s also a good idea to know your test results and keep a list of the medicines you take.  How can you care for yourself at home?  ?? Get regular exercise, but do not overdo it. Go back and forth between rest and exercise.   ?? Get plenty of rest.   ?? Eat a healthy diet. Do not skip meals, especially breakfast.   ?? Reduce your use of caffeine, tobacco, and alcohol. Caffeine is most commonly found in coffee, tea, cola drinks, and chocolate.   ?? Limit medicines that can cause fatigue, such as tranquilizers and cold and allergy medicines.   When should you call for help?  Watch closely for changes in your health, and be sure to contact your doctor if:  ?? You have new symptoms such as fever or a rash.   ?? Your fatigue gets worse.   ?? You have been feeling down, depressed, or hopeless or have lost interest in things that you usually enjoy.   ?? You are not getting better as expected.     Where can you learn more?    Go to [email or web address]   Enter [phone] in the search box to learn more about "Fatigue: After Your Visit."    ?? 2006-2013 [employer]. Care instructions adapted  under license by [employer] (which disclaims liability or warranty for this information). This care instruction is for use with your licensed healthcare professional. If you have questions about a medical condition or this instruction, always ask your healthcare professional. [employer] disclaims any warranty or liability for your use of this information.  Content Version: 9.7.130178; Last Revised: January 17, 2010          Diabetes Foot Care: After Your Visit  Your Care Instructions  When you have diabetes, your feet need extra care and attention. Diabetes can damage the nerve endings and blood vessels in your feet, making you less likely to notice when your feet are injured. Diabetes also limits your body's ability to fight infection and get blood to areas that need it. If you get a minor foot injury, it could become an ulcer or a serious infection. With good foot care, you can prevent most of these problems.  Caring for your feet can be quick and easy. Most of the care can be done when you are bathing or getting ready for bed.  Follow-up care is a key part of your treatment and safety. Be sure to make and go to all appointments, and call your doctor if you are having problems. It???s also a good idea to know your test results and keep a list of the medicines you take.  How can  you care for yourself at home?  ?? Keep your blood sugar close to normal by watching what and how much you eat, monitoring blood sugar, taking medicines if prescribed, and getting regular exercise.   ?? Do not smoke. Smoking affects blood flow and can make foot problems worse. If you need help quitting, talk to your doctor about stop-smoking programs and medicines. These can increase your chances of quitting for good.   ?? Eat a diet that is low in fats. High fat intake can cause fat to build up in your blood vessels and decrease blood flow.   ?? Inspect your feet daily for blisters, cuts, cracks, or sores. If you cannot see well,  use a mirror or have someone help you.   ?? Take care of your feet:   ?? Wash your feet every day. Use warm (not hot) water. Check the water temperature with your wrists or other part of your body, not your feet.   ?? Dry your feet well. Pat them dry. Do not rub the skin on your feet too hard. Dry well between your toes. If the skin on your feet stays moist, bacteria or a fungus can grow, which can lead to infection.   ?? Keep your skin soft. Use moisturizing skin cream to keep the skin on your feet soft and prevent calluses and cracks. But do not put the cream between your toes, and stop using any cream that causes a rash.   ?? Clean underneath your toenails carefully. Do not use a sharp object to clean underneath your toenails. Use the blunt end of a nail file or other rounded tool.   ?? Trim and file your toenails straight across to prevent ingrown toenails. Use a nail clipper, not scissors. Use an emery board to smooth the edges.   ?? Change socks daily. Socks without seams are best, because seams often rub the feet. You can find socks for people with diabetes from specialty catalogs.   ?? Look inside your shoes every day for things like gravel or torn linings, which could cause blisters or sores.   ?? Buy shoes that fit well:   ?? Look for shoes that have plenty of space around the toes. This helps prevent bunions and blisters.   ?? Try on shoes while wearing the kind of socks you will usually wear with the shoes.   ?? Avoid plastic shoes. They may rub your feet and cause blisters. Good shoes should be made of materials that are flexible and breathable, such as leather or cloth.   ?? Break in new shoes slowly by wearing them for no more than an hour a day for several days. Take extra time to check your feet for red areas, blisters, or other problems after you wear new shoes.   ?? Do not go barefoot. Do not wear sandals, and do not wear shoes with very thin soles. Thin soles are easy to puncture. They also do not protect your  feet from hot pavement or cold weather.   ?? Have your doctor check your feet during each visit. If you have a foot problem, see your doctor. Do not try to treat an early foot problem at home. Home remedies or treatments that you can buy without a prescription (such as corn removers) can be harmful.   ?? Always get early treatment for foot problems. A minor irritation can lead to a major problem if not properly cared for early.   When should you call  for help?  Call your doctor now or seek immediate medical care if:  ?? You have a foot sore, an ulcer or break in the skin that is not healing after 4 days, bleeding corns or calluses, or an ingrown toenail.   ?? You have blue or black areas, which can mean bruising or blood flow problems.   ?? You have peeling skin or tiny blisters between your toes or cracking or oozing of the skin.   ?? You have a fever for more than 24 hours and a foot sore.   ?? You have new numbness or tingling in your feet that does not go away after you move your feet or change positions.   ?? You have unexplained or unusual swelling of the foot or ankle.   Watch closely for changes in your health, and be sure to contact your doctor if:  ?? You cannot do proper foot care.     Where can you learn more?    Go to [email or web address]   Enter A739 in the search box to learn more about "Diabetes Foot Care: After Your Visit."    ?? 2006-2013 [employer]. Care instructions adapted under license by [employer] (which disclaims liability or warranty for this information). This care instruction is for use with your licensed healthcare professional. If you have questions about a medical condition or this instruction, always ask your healthcare professional. [employer] disclaims any warranty or liability for your use of this information.  Content Version: 9.7.130178; Last Revised: January 07, 2010          Diabetes Diet Guidelines: After Your Visit  Your Care Instructions  A  healthy diet is important to manage diabetes. It helps keep your blood sugar at a target level (which you set with your doctor). A diet for diabetes does not mean that you have to eat special foods. You can eat what your family eats, including sweets once in a while. But you do have to pay attention to how often you eat and how much you eat of certain foods.  Managing the amount of carbohydrate you eat is an important part of a healthy diet for diabetes. Carbohydrate is found in sugar and sweets, grains, fruit, starchy vegetables, and milk and yogurt.  You may want to work with a dietitian or a certified diabetes educator (CDE) to help you plan meals and snacks. A dietitian or CDE can also help you lose weight if that is one of your goals.  Follow-up care is a key part of your treatment and safety. Be sure to make and go to all appointments, and call your doctor if you are having problems. It???s also a good idea to know your test results and keep a list of the medicines you take.  How can you care for yourself at home?  ?? Learn which foods have carbohydrate.   ?? Bread, cereal, pasta, and rice have about 15 grams of carbohydrate in a serving. A serving is 1 slice of bread (1 ounce), ?? cup of cooked cereal, or 1/3 cup of cooked pasta or rice.   ?? Fruits have 15 grams of carbohydrate in a serving. A serving is 1 small fresh fruit, such as an apple or orange; ?? of a banana; ?? cup of cooked or canned fruit; ?? cup of fruit juice; 1 cup of melon or raspberries; or 2 tablespoons of dried fruit.   ?? Milk and no-sugar-added yogurt have 15 grams of carbohydrate in a serving.  A serving is 1 cup of milk or 2/3 cup of no-sugar-added yogurt.   ?? Starchy vegetables have 15 grams of carbohydrate in a serving. A serving is ?? cup of mashed potatoes or sweet potato; 1 cup winter squash; ?? of a small baked potato; 1/4 cup of cooked dried beans; or ?? cup cooked corn or green peas.   ?? Learn how much carbohydrate to eat each day. A  dietitian or CDE can teach you how to keep track of the amount of carbohydrate you eat. This is called carbohydrate counting.   ?? Try to eat about the same amount of carbohydrate at each meal. Your dietitian or CDE can tell you how much carbohydrate to eat at each meal and snack. Do not "save up" your daily allowance of carbohydrate to eat at one meal.   ?? If you are not sure how to count carbohydrate grams, use the Plate Method to plan meals. It is a good, quick way to make sure that you have a balanced meal. It also helps you spread carbohydrate throughout the day. Divide your plate by types of foods. Put vegetables on half the plate, meat or other protein food on one-quarter of the plate, and a grain or starchy vegetable (such as brown rice or a potato) in the final quarter of the plate. To this you can add a small piece of fruit and 1 cup of milk or yogurt, depending on how much carbohydrate you are supposed to eat at a meal.   ?? Limit saturated fat, such as the fat from meat and dairy products. Choose lean cuts of meat and nonfat or low-fat dairy products. Use olive or canola oil instead of butter or shortening when cooking. This is a healthy choice because people who have diabetes are at higher-than-average risk of heart disease.   ?? Do not skip meals. Your blood sugar may drop too low if you skip meals and take certain diabetes pills or insulin.   ?? Work with your doctor to write up a sick-day plan for what to do on days when you are sick. Your blood sugar can go up or down depending on your illness and whether you can keep food down. Call your doctor when you are sick, to see if you need to adjust your pills or insulin.   ?? Check with your doctor before you drink alcohol. Alcohol can cause your blood sugar to drop too low. Alcohol can also cause a bad reaction if you take certain diabetes pills.     Where can you learn more?    Go to [email or web address]   Enter I147 in the search box to  learn more about "Diabetes Diet Guidelines: After Your Visit."    ?? 2006-2013 [employer]. Care instructions adapted under license by [employer] (which disclaims liability or warranty for this information). This care instruction is for use with your licensed healthcare professional. If you have questions about a medical condition or this instruction, always ask your healthcare professional. [employer] disclaims any warranty or liability for your use of this information.  Content Version: 9.7.130178; Last Revised: October 03, 2010          Getting Back to Normal After Low Back Pain: After Your Visit  Your Care Instructions  Almost everyone has low back pain at some time. The good news is that most low back pain will go away in a few days or weeks with some basic self-care.  Some people are afraid  that doing too much may make their pain worse. In the past, people stayed in bed, thinking this would help their backs. Now doctors think that, in most cases, getting back to your normal activities is good for your back, as long as you avoid doing things that make your pain worse.  Follow-up care is a key part of your treatment and safety. Be sure to make and go to all appointments, and call your doctor if you are having problems. It???s also a good idea to know your test results and keep a list of the medicines you take.  How can you care for yourself at home?  Ease back into daily activities  ?? For the first day or two of pain, take it easy. But as soon as possible, get back to your normal daily life and activities.   ?? Get gentle exercise, such as walking. Movement keeps your spine flexible and helps your muscles stay strong.   ?? If you are an athlete, return to your activity carefully. Choose a low-impact option until your pain is under control.   Avoid or change activities that cause pain  ?? Try to avoid too much bending, heavy lifting, or reaching. These movements put extra stress on your  back.   ?? In bed, try lying on your side with a pillow between your knees. Or lie on your back on the floor with a pillow under your knees.   ?? When you sit, place a small pillow, a rolled-up towel, or a lumbar roll in the curve of your back for extra support.   ?? Try putting one foot up on a stool or changing positions every few minutes if you have to stand still for a period of time.   Pay attention to body mechanics and posture  Body mechanics are the way you use your body. Posture is the way you sit or stand.  ?? Take extra care when you lift. When you must lift, bend your knees and keep your back straight. Avoid twisting, and keep the load close to your body.   ?? Stand or sit tall, with your shoulders back and your stomach pulled in to support your back.   Get support when you need it  ?? Let people know when you need a helping hand. Get family members or friends to help out with tasks you cannot do right now.   ?? Be honest with your doctor about how the pain affects you.   ?? If you have had to take time off work, talk to your doctor and boss about a gradual return-to-work plan. Find out if there are other ways you could do your job to avoid hurting your back again.   Reduce stress  Worrying about the pain can cause you to tense the muscles in your lower back. This in turn causes more pain. Here are a few things you can do to relax your mind and your muscles:  ?? Take 10 to 15 minutes to sit quietly and breathe deeply. Try to focus only on your breathing. If you cannot keep thoughts away, think about things that make you feel good.   ?? Get involved in your favorite hobby, or try something new.   ?? Talk to a friend, read a book, or listen to your favorite music.   ?? Find a counselor you like and trust. Talk openly and honestly about your problems. Be willing to make some changes.   When should you call for  help?  Call 911 anytime you think you may need emergency care. For example, call if:  ?? You lose bladder or  bowel control.   ?? You suddenly cannot walk or stand.   ?? You have sudden numbness or weakness in one or both legs.   Call your doctor now or seek immediate medical care if:  ?? You have new pain, numbness, tingling, or weakness, especially in the rear end (buttocks) or genital area, legs, or feet.   ?? Your back pain gets worse or more frequent.   ?? You start to feel depressed or anxious.   Watch closely for changes in your health, and be sure to contact your doctor if:  ?? You start to take more medicine for pain.   ?? You do not get better as expected.     Where can you learn more?    Go to [email or web address]   Enter [phone] in the search box to learn more about "Getting Back to Normal After Low Back Pain: After Your Visit."    ?? 2006-2013 [employer]. Care instructions adapted under license by [employer] (which disclaims liability or warranty for this information). This care instruction is for use with your licensed healthcare professional. If you have questions about a medical condition or this instruction, always ask your healthcare professional. [employer] disclaims any warranty or liability for your use of this information.  Content Version: 9.7.130178; Last Revised: November 08, 2010

## 2012-05-23 NOTE — Progress Notes (Signed)
HISTORY OF PRESENT ILLNESS  Chelsea Schultz is a 78 y.o. female presents with Medication Evaluation, Fatigue and Hip Pain    1.   Agree with nurse note.    ROS    Review of Systems negative except as noted above in HPI.    ALLERGIES:  No Known Allergies    CURRENT MEDICATIONS:    Current Outpatient Prescriptions   Medication Sig   ??? dipyridamole-aspirin (AGGRENOX) 200-25 mg per SR capsule Take 1 Cap by mouth two (2) times a day.   ??? MV-MN/FA/D3/LYCOPENE/LUT/COQ10 (DAILY MULTIVITAMIN PO) Take  by mouth daily.   ??? ascorbic acid (VITAMIN C) 250 mg tablet Take  by mouth two (2) times a day.   ??? docusate sodium (DOC-Q-LACE) 100 mg capsule Take 100 mg by mouth daily.   ??? amLODIPine (NORVASC) 10 mg tablet Take  by mouth daily.   ??? valsartan (DIOVAN) 320 mg tablet Take  by mouth daily.   ??? pregabalin (LYRICA) 100 mg capsule Take 1 Cap by mouth two (2) times a day.   ??? sitaGLIPtin (JANUVIA) 100 mg tablet Take 1 Tab by mouth daily.   ??? omeprazole (PRILOSEC) 20 mg capsule TAKE ONE CAPSULE BY MOUTH DAILY   ??? HYDROcodone-acetaminophen (LORTAB) 2.5-500 mg per tablet Take 1 Tab by mouth every six (6) hours as needed for Pain.   ??? metFORMIN (GLUCOPHAGE) 500 mg tablet Take 2 Tabs by mouth two (2) times daily (with meals).   ??? potassium chloride (K-DUR, KLOR-CON) 20 mEq tablet Take 1 Tab by mouth daily.   ??? ibuprofen (MOTRIN) 800 mg tablet Take 1 Tab by mouth every eight (8) hours as needed for Pain.   ??? Comp.Stocking,Knee,Regular,Lrg Misc 1 Each by Does Not Apply route daily.   ??? calcium-cholecalciferol, D3, (CALCIUM 600 + D) tablet Take 2 Tabs by mouth daily.   ??? Psyllium Husk-Sucrose (METAMUCIL) 3.4 gram/7 gram Powd Take 1 Cap by mouth daily.         PAST MEDICAL HISTORY:    Past Medical History   Diagnosis Date   ??? Diabetes    ??? Hypertension    ??? Neuropathy    ??? Spinal stenosis    ??? CVA (cerebral infarction)        PAST SURGICAL HISTORY:    Past Surgical History   Procedure Date   ??? Hx orthopaedic      Lumbar laminectomy   ???  Hx orthopaedic      Lumbar fusion       FAMILY HISTORY:    Family History   Problem Relation Age of Onset   ??? Diabetes Mother    ??? Hypertension Father        SOCIAL HISTORY:    History     Social History   ??? Marital Status: UNKNOWN     Spouse Name: N/A     Number of Children: N/A   ??? Years of Education: N/A     Social History Main Topics   ??? Smoking status: Never Smoker    ??? Smokeless tobacco: Never Used   ??? Alcohol Use: No   ??? Drug Use: No   ??? Sexually Active: Not on file     Other Topics Concern   ??? Not on file     Social History Narrative   ??? No narrative on file       IMMUNIZATIONS:  There is no immunization history for the selected administration types on file for this patient.  PHYSICAL EXAMINATION    Vital Signs  BP 141/66   Pulse 108   Temp(Src) 96.6 ??F (35.9 ??C) (Oral)   Resp 20   Ht 4\' 7"  (1.397 m)   Wt 118 lb 8 oz (53.751 kg)   BMI 27.54 kg/m2   LMP 10/23/1978    General appearance - Well nourished. Well appearing.  Well developed.  No acute distress. Overweight.   Head - Normocephalic.  Atraumatic.    Eyes - pupils equal and reactive. Extraocular eye movements intact.   Ears - Hearing is grossly normal bilaterally.      Nose - normal and patent.  No polyps noted.  No erythema.  No discharge.    Mouth - mucous membranes with adequate moisture.  Posterior pharynx normal with cobblestone appearance.  No erythema, white exudate or obstruction.  Neck - supple.  Midline trachea.  No carotid bruits noted bilaterally.  No thyromegaly noted.  Chest - clear to auscultation bilaterally anterriorly and posteriorly.  No wheezes.  No rales or rhonchi.  Breath sounds are symmetrical bilaterally.  Unlabored respirations.  Heart - normal rate.  Regular rhythm.  Normal S1, S2.  No murmur noted.  No rubs, clicks or gallops noted.  Abdomen - soft and nondistended.  No masses or organomegaly.  No rebound, rigidity or guarding.  Bowel sounds normal x 4 quadrants.  No tenderness noted.  Neurological - awake, alert and  oriented to person, place, and time and event.  Cranial nerves II through XII intact.  Clear speech.  Muscle strength is +5/5 x 4 extremities.  Sensation is intact to light touch bilaterally.  Steady gait.   Skin - no rashes, erythema, ecchymosis, lacerations, abrasions, suspicious moles noted  Psychological -   normal behavior, dress and thought processes.  Good insight. Good eye contact.  Normal affect.  Appropriate mood.  Normal speech.    DATA REVIEWED    Results for orders placed during the hospital encounter of 04/02/12   CBC WITH AUTOMATED DIFF       Component Value Range    WBC 8.3  3.6 - 11.0 K/uL    RBC 4.28  3.80 - 5.20 M/uL    HGB 11.8  11.5 - 16.0 g/dL    HCT 16.1  09.6 - 04.5 %    MCV 83.4  80.0 - 99.0 FL    MCH 27.6  26.0 - 34.0 PG    MCHC 33.1  30.0 - 36.5 g/dL    RDW 40.9  81.1 - 91.4 %    PLATELET 254  150 - 400 K/uL    NEUTROPHILS 55  32 - 75 %    LYMPHOCYTES 27  12 - 49 %    MONOCYTES 14 (*) 5 - 13 %    EOSINOPHILS 4  0 - 7 %    BASOPHILS 0  0 - 1 %    ABS. NEUTROPHILS 4.6  1.8 - 8.0 K/UL    ABS. LYMPHOCYTES 2.3  0.8 - 3.5 K/UL    ABS. MONOCYTES 1.2 (*) 0.0 - 1.0 K/UL    ABS. EOSINOPHILS 0.3  0.0 - 0.4 K/UL    ABS. BASOPHILS 0.0  0.0 - 0.1 K/UL   EKG, 12 LEAD, INITIAL       Component Value Range    Ventricular Rate 69      Atrial Rate 69      P-R Interval 160      QRS Duration 72      Q-T Interval  392      QTC Calculation (Bezet) 420      Calculated P Axis 44      Calculated R Axis 20      Calculated T Axis 17      Diagnosis        Value: Normal sinus rhythm      Normal ECG      Confirmed by Donnella Bi, Shashi ([id number]) on 04/03/2012 8:20:59 AM   URINALYSIS W/ REFLEX CULTURE       Component Value Range    Color YELLOW      Appearance CLEAR      Specific gravity 1.016  1.003 - 1.030      pH 6.5  5.0 - 8.0      Protein NEGATIVE   NEGATIVE MG/DL    Glucose NEGATIVE   NEGATIVE MG/DL    Ketone NEGATIVE   NEGATIVE MG/DL    Bilirubin NEGATIVE   NEGATIVE    Blood NEGATIVE   NEGATIVE    Urobilinogen 0.2  0.2  - 1.0 EU/DL    Nitrites POSITIVE (*) NEGATIVE    Leukocyte Esterase SMALL (*) NEGATIVE    WBC 5-10  0 - 4 /HPF    RBC 0-3  0 - 5 /HPF    Epithelial cells 0-5  0 - 5 /LPF    Bacteria 4+ (*) NEGATIVE /HPF    UA:UC IF INDICATED URINE CULTURE ORDERED      Hyaline Cast 0-2  0 - 2   METABOLIC PANEL, COMPREHENSIVE       Component Value Range    Sodium 139  136 - 145 MMOL/L    Potassium 4.2  3.5 - 5.1 MMOL/L    Chloride 104  97 - 108 MMOL/L    CO2 30  21 - 32 MMOL/L    Anion gap 5  5 - 15 mmol/L    Glucose 107 (*) 65 - 100 MG/DL    BUN 18  6 - 20 MG/DL    Creatinine 6.04  5.40 - 1.15 MG/DL    BUN/Creatinine ratio 22 (*) 12 - 20      GFR est-AA >60  >60 ml/min/1.33m2    GFR est non-AA >60  >60 ml/min/1.45m2    Calcium 9.5  8.5 - 10.1 MG/DL    Bilirubin, total 0.2  0.2 - 1.0 MG/DL    ALT 21  12 - 78 U/L    AST 22  15 - 37 U/L    Alk. phosphatase 89  50 - 136 U/L    Protein, total 7.6  6.4 - 8.2 g/dL    Albumin 3.5  3.5 - 5.0 g/dL    Globulin 4.1 (*) 2.0 - 4.0 g/dL    A-G Ratio 0.9 (*) 1.1 - 2.2     POC TROPONIN-I       Component Value Range    Troponin-I (POC) <0.04  0.00 - 0.08 ng/mL   CULTURE, URINE       Component Value Range    Specimen Description: URINE      Special Requests: NO SPECIAL REQUESTS      Colony Count >100,000 COLONIES/mL      Culture result: ESCHERICHIA COLI      Report Status 04/04/2012 FINAL         ASSESSMENT and PLAN    1. DM (diabetes mellitus)  METABOLIC PANEL, COMPREHENSIVE, HEMOGLOBIN A1C, MICROALBUMIN:CREATININE RATIO, RANDOM URINE    2. Fatigue  CBC WITH AUTOMATED DIFF   3. HTN (hypertension)  amLODIPine (NORVASC) 10 mg tablet, valsartan (DIOVAN) 320 mg tablet   4. Abnormal urine  AMB POC URINALYSIS DIP STICK AUTO W/ MICRO   5. Abnormal finding in urine  CULTURE, URINE, nitrofurantoin, macrocrystal-monohydrate, (MACROBID) 100 mg capsule       Prescriptions written and sent to pharmacy; medication side effects discussed  Discussed the patient's BMI with her.  The BMI follow up plan is as follows:  BMI is out of normal parameters and plan is as follows: I have counseled this patient on diet and exercise regimens.      Patient Instructions       Fatigue: After Your Visit  Your Care Instructions  Fatigue is a feeling of tiredness, exhaustion, or lack of energy. You may feel fatigue because of too much or not enough activity. It can also come from stress, lack of sleep, boredom, and poor diet. Many medical problems, including viral infections, can cause fatigue. Emotional problems, especially depression, are often the cause of fatigue.  Fatigue is usually a symptom of another problem. Treatment for fatigue depends on the cause. For example, if you have fatigue because you have a certain health condition, treating this condition also treats your fatigue. If depression or anxiety is the cause, treatment may help.  Follow-up care is a key part of your treatment and safety. Be sure to make and go to all appointments, and call your doctor if you are having problems. It???s also a good idea to know your test results and keep a list of the medicines you take.  How can you care for yourself at home?  ?? Get regular exercise, but do not overdo it. Go back and forth between rest and exercise.   ?? Get plenty of rest.   ?? Eat a healthy diet. Do not skip meals, especially breakfast.   ?? Reduce your use of caffeine, tobacco, and alcohol. Caffeine is most commonly found in coffee, tea, cola drinks, and chocolate.   ?? Limit medicines that can cause fatigue, such as tranquilizers and cold and allergy medicines.   When should you call for help?  Watch closely for changes in your health, and be sure to contact your doctor if:  ?? You have new symptoms such as fever or a rash.   ?? Your fatigue gets worse.   ?? You have been feeling down, depressed, or hopeless or have lost interest in things that you usually enjoy.   ?? You are not getting better as expected.     Where can you learn more?    Go to [employer]    Enter [phone] in the search box to learn more about "Fatigue: After Your Visit."    ?? 2006-2013 [employer]. Care instructions adapted under license by [employer] (which disclaims liability or warranty for this information). This care instruction is for use with your licensed healthcare professional. If you have questions about a medical condition or this instruction, always ask your healthcare professional. [employer], Inc[employer] disclaims any warranty or liability for your use of this information.  Content Version: 9.7.130178; Last Revised: January 17, 2010

## 2012-05-23 NOTE — Telephone Encounter (Signed)
Left message for pt's daughter, Alexianna Nachreiner to call back.  She needs to know that prescription antibiotic, Macrobid was sent to her pharmacy, because she has an UTI.

## 2012-05-24 LAB — METABOLIC PANEL, COMPREHENSIVE
A-G Ratio: 1.5 (ref 1.1–2.5)
ALT (SGPT): 10 IU/L (ref 0–32)
AST (SGOT): 18 IU/L (ref 0–40)
Albumin: 4 g/dL (ref 3.5–4.7)
Alk. phosphatase: 66 IU/L (ref 45–108)
BUN/Creatinine ratio: 26 (ref 11–26)
BUN: 15 mg/dL (ref 8–27)
Bilirubin, total: 0.2 mg/dL (ref 0.0–1.2)
CO2: 24 mmol/L (ref 19–28)
Calcium: 10.9 mg/dL — ABNORMAL HIGH (ref 8.6–10.2)
Chloride: 100 mmol/L (ref 97–108)
Creatinine: 0.58 mg/dL (ref 0.57–1.00)
GFR est non-AA: 85 mL/min/{1.73_m2} (ref >59)
GLOBULIN, TOTAL: 2.7 g/dL (ref 1.5–4.5)
Glucose: 88 mg/dL (ref 65–99)
Potassium: 4.1 mmol/L (ref 3.5–5.2)
Protein, total: 6.7 g/dL (ref 6.0–8.5)
Sodium: 137 mmol/L (ref 134–144)
eGFR If African American: 98 mL/min/{1.73_m2} (ref >59)

## 2012-05-24 LAB — MICROALBUMIN:CREATININE RATIO, RANDOM URINE
Creatinine, urine random: 86.3 mg/dL (ref 15.0–278.0)
Microalb/Creat ratio (ug/mg creat.): 197.3 mg/g creat — ABNORMAL HIGH (ref 0.0–30.0)
Microalbumin, urine: 170.3 ug/mL — ABNORMAL HIGH (ref 0.0–17.0)

## 2012-05-24 LAB — CBC WITH AUTOMATED DIFF
ABS. BASOPHILS: 0 10*3/uL (ref 0.0–0.2)
ABS. EOSINOPHILS: 0.2 10*3/uL (ref 0.0–0.4)
ABS. IMM. GRANS.: 0 10*3/uL (ref 0.0–0.1)
ABS. MONOCYTES: 0.8 10*3/uL (ref 0.1–1.0)
ABS. NEUTROPHILS: 7.5 10*3/uL (ref 1.8–7.8)
Abs Lymphocytes: 1.8 10*3/uL (ref 0.7–4.5)
BASOPHILS: 0 % (ref 0–3)
EOSINOPHILS: 2 % (ref 0–7)
HCT: 38.7 % (ref 34.0–46.6)
HGB: 12 g/dL (ref 11.1–15.9)
IMMATURE GRANULOCYTES: 0 % (ref 0–2)
Lymphocytes: 17 % (ref 14–46)
MCH: 26.8 pg (ref 26.6–33.0)
MCHC: 31 g/dL — ABNORMAL LOW (ref 31.5–35.7)
MCV: 87 fL (ref 79–97)
MONOCYTES: 8 % (ref 4–13)
NEUTROPHILS: 73 % (ref 40–74)
PLATELET: 308 10*3/uL (ref 140–415)
RBC: 4.47 x10E6/uL (ref 3.77–5.28)
RDW: 14.3 % (ref 12.3–15.4)
WBC: 10.4 10*3/uL (ref 4.0–10.5)

## 2012-05-24 LAB — HEMOGLOBIN A1C WITH EAG: Hemoglobin A1c: 6.1 % — ABNORMAL HIGH (ref 4.8–5.6)

## 2012-05-27 LAB — CULTURE, URINE

## 2012-05-28 ENCOUNTER — Encounter

## 2012-05-28 MED ORDER — TRIMETHOPRIM-SULFAMETHOXAZOLE 160 MG-800 MG TAB
160-800 mg | ORAL_TABLET | Freq: Two times a day (BID) | ORAL | Status: DC
Start: 2012-05-28 — End: 2012-06-03

## 2012-05-28 NOTE — Progress Notes (Signed)
Quick Note:    Abnormal Urine Microalbumin  HgA1c is Less than 7 good job  I have changed your antibiotic to Bactrim because Nitrofurantoin is intermediate coverage.     Please inform patient.    ______

## 2012-05-28 NOTE — Telephone Encounter (Signed)
Message copied by Maudry Diego on Tue May 28, 2012  4:07 PM  ------       Message from: Linward Headland, BERNICE NATALIE       Created: Tue May 28, 2012  4:00 PM         Abnormal Urine Microalbumin       HgA1c is  Less than 7 good job       I have changed your antibiotic to Bactrim because Nitrofurantoin is intermediate coverage.                Please inform patient.

## 2012-05-28 NOTE — Telephone Encounter (Signed)
Called and spoke with pt and she has been informed and states understanding of her results.

## 2012-05-31 NOTE — Progress Notes (Signed)
HISTORY OF PRESENT ILLNESS  Chelsea Schultz is a 78 y.o. female presents for hospital follow up.     HPI  Pt and family states that she went to [facility] for acute weakness.  Pt states that since her back surgery she was doing PT until Tuesday.  She went to the ED for sudden onset of LE weakness and poor ambulation at which time she was diagnosed with exhaustion.   The only exacerbating issue was climbing the stairs.  Prior to that she was doing well. Denies any paresthesias, but does note some weakness. +increasing upper extremity weakness as well.  Denies any issues with bowel or bladder incontinence. Denies any fever, chills, abdominal pain. Denies any LE swelling. Denies taking any medication. She was however diagnosed with a UTI with today as the last day for antibiotics. She currently lives with her daughter previously she was ambulatory with her walker alone.  Since tues, she has been mostly needing help with her ADLs. Denies any falls or injuries.denies any pain at this time.     Review of Systems   All other systems reviewed and are negative.        Physical Exam   Vitals reviewed.  Constitutional: She appears well-developed and well-nourished. No distress.   Cardiovascular: Normal rate, regular rhythm and normal heart sounds.  Exam reveals no gallop and no friction rub.    No murmur heard.  Pulmonary/Chest: Effort normal and breath sounds normal. No respiratory distress. She has no wheezes. She has no rales.   Abdominal: Soft. Bowel sounds are normal. She exhibits no distension. There is no tenderness. There is no rebound and no guarding.   Musculoskeletal: Normal range of motion. She exhibits no edema and no tenderness.   Neurological:        Strength 5/5 RLE with plantar and dorsiflexion. Strength 4+/5 thigh on the right and 3-4/5 strength of the thigh on te left. Strength 4+/5 dorsi and plantar flexion.    Skin: She is not diaphoretic.       ASSESSMENT and PLAN  1. Muscular deconditioning    2.  Femoral neuropathy.   -cont PT. Fall precautions. RTC prn.

## 2012-05-31 NOTE — Patient Instructions (Signed)
Muscle Conditioning: Exercises  Your Care Instructions  Here are some examples of exercises for muscle conditioning. Start each exercise slowly. Ease off the exercise if you start to have pain.  Your doctor or physical therapist will tell you when you can start these exercises and which ones will work best for you.  How to do the exercises  Wall push-ups    1. Stand facing a wall, about 12 to 18 inches away.  2. Place your hands on the wall at shoulder height.  3. Slowly bend your elbows and bring your face toward the wall, moving your hips and shoulders forward together.  4. Push slowly back to the starting position.  5. Start with 5 repetitions and work up to 8 to 12.  6. Rest for a minute, and repeat the exercise.  Note: When you can do this exercise against a wall comfortably (without your muscles feeling tired), you can try it against a counter. Start with 5 repetitions again and work up to 8 to 12. You can then slowly progress to the end of a couch or a sturdy chair, and finally to the floor.  Knee extension    1. While sitting in a chair, straighten one leg and hold while you slowly count to 5. Be sure you do not lock your knee.  2. Repeat 8 to 12 times.  3. Rest for a minute, and repeat the exercise.  4. Do the same exercise with the other leg.  Note: If this exercise becomes easy, you can add a light weight around your ankle or tie an elastic resistance band to a chair leg and one ankle.  Side-lying leg lift    1. Lie on your side, with your legs extended. Keep your hips straight up and down during this exercise. Do not let your top hip rock toward the back. Support your head with your hand, and place the other hand on the floor near your waist.  2. Slowly raise your upper leg until it is about in line with your shoulder. Keep your toes pointed forward.  3. Slowly lower your leg to the starting position.  4. Repeat 8 to 12 times.  5. Rest for a minute, and repeat the exercise.  6. Turn to your other side  and do the same exercise with your other leg.  Note: If this exercise becomes easy, you can add a light weight around your ankle or tie an elastic resistance band to both ankles.  Shallow standing knee bends    1. Stand with your hands lightly resting on a counter or chair in front of you with your feet shoulder-width apart.  2. Slowly bend your knees so that you squat down just like you were going to sit in a chair. Make sure your knees do not go in front of your toes.  3. Lower yourself about 6 inches. Your heels should remain on the floor at all times.  4. Rise slowly to a standing position.  5. Repeat 8 to 12 times.  6. Rest for a minute, and repeat the exercise.  Follow-up care is a key part of your treatment and safety. Be sure to make and go to all appointments, and call your doctor if you are having problems. It's also a good idea to know your test results and keep a list of the medicines you take.   Where can you learn more?   Go to [employer]  Enter P608 in the search box to learn  more about "Muscle Conditioning: Exercises."   ?? 2006-2013 [employer], [employer]. Care instructions adapted under license by [employer] (which disclaims liability or warranty for this information). This care instruction is for use with your licensed healthcare professional. If you have questions about a medical condition or this instruction, always ask your healthcare professional. [employer], [employer] disclaims any warranty or liability for your use of this information.  Content Version: 9.7.130178; Last Revised: August 26, 2010

## 2012-05-31 NOTE — Progress Notes (Signed)
Chief Complaint   Patient presents with   ??? Hospital Follow Up     [facility] 05/30/12   ??? Extremity Weakness   ??? Dehydration     Dr. Randa Evens pt.

## 2012-06-03 NOTE — Progress Notes (Signed)
Chief Complaint   Patient presents with   ??? Referral Request     to Neurologist.   ??? Dehydration     ER visit follow-up.   ??? Fatigue     ER visit follow-up.     REVIEWED RECORD IN PREPARATION FOR VISIT AND HAVE OBTAINED NECESSARY DOCUMENTATION

## 2012-06-03 NOTE — Patient Instructions (Signed)
Urinary Tract Infection in Women: After Your Visit  Your Care Instructions  A urinary tract infection, or UTI, is a general term for an infection anywhere between the kidneys and the urethra (where urine comes out). Most UTIs are bladder infections. They often cause pain or burning when you urinate.  UTIs are caused by bacteria and can be cured with antibiotics. Be sure to complete your treatment so that the infection goes away.  Follow-up care is a key part of your treatment and safety. Be sure to make and go to all appointments, and call your doctor if you are having problems. It???s also a good idea to know your test results and keep a list of the medicines you take.  How can you care for yourself at home?  ?? Take your antibiotics as directed. Do not stop taking them just because you feel better. You need to take the full course of antibiotics.   ?? Drink extra water and other fluids for the next day or two. This may help wash out the bacteria that are causing the infection. (If you have kidney, heart, or liver disease and have to limit fluids, talk with your doctor before you increase your fluid intake.)   ?? Avoid drinks that are carbonated or have caffeine. They can irritate the bladder.   ?? Urinate often. Try to empty your bladder each time.   ?? To relieve pain, take a hot bath or lay a heating pad set on low over your lower belly or genital area. Never go to sleep with a heating pad in place.   To prevent UTIs  ?? Drink plenty of water each day. This helps you urinate often, which clears bacteria from your system. (If you have kidney, heart, or liver disease and have to limit fluids, talk with your doctor before you increase your fluid intake.)   ?? Consider adding cranberry juice to your diet.   ?? Urinate when you need to.   ?? Urinate right after you have sex.   ?? Change sanitary pads often.   ?? Avoid douches, bubble baths, feminine hygiene sprays, and other feminine hygiene products that have deodorants.   ??  After going to the bathroom, wipe from front to back.   When should you call for help?  Call your doctor now or seek immediate medical care if:  ?? Symptoms such as fever, chills, nausea, or vomiting get worse or appear for the first time.   ?? You have new pain in your back just below your rib cage. This is called flank pain.   ?? There is new blood or pus in your urine.   ?? You have any problems with your antibiotic medicine.   Watch closely for changes in your health, and be sure to contact your doctor if:  ?? You are not getting better after 1 day (24 hours).   ?? Your symptoms go away but then come back.     Where can you learn more?    Go to http://www.[email or web address].net/[email or web address]   Enter K848 in the search box to learn more about "Urinary Tract Infection in Women: After Your Visit."    ?? 2006-2013 Healthwise, Incorporated. Care instructions adapted under license by [employer] (which disclaims liability or warranty for this information). This care instruction is for use with your licensed healthcare professional. If you have questions about a medical condition or this instruction, always ask your healthcare professional. Healthwise, Incorporated disclaims any warranty or liability for your use   of this information.  Content Version: 9.7.130178; Last Revised: Mar 08, 2010          Urinary Retention: After Your Visit  Your Care Instructions  Urinary retention is the inability to urinate. In men, it is often caused by a blockage of the urinary tract from an enlarged prostate gland. In men and women, it can also be caused by an infection or nerve damage or can be a side effect of a medicine.  The doctor may have drained the urine from your bladder. If you still have problems urinating, you may need to use a catheter at home to empty your bladder until the problem can be fixed. Your doctor may insert a catheter into your bladder before you go home. If so, your doctor will tell you when to come back to have the catheter  removed.  The doctor has checked you carefully, but problems can develop later. If you notice any problems or new symptoms, get medical treatment right away.  Follow-up care is a key part of your treatment and safety. Be sure to make and go to all appointments, and call your doctor if you are having problems. It's also a good idea to know your test results and keep a list of the medicines you take.  How can you care for yourself at home?  ?? Take your medicines exactly as prescribed. Call your doctor if you think you are having a problem with your medicine. You will get more details on the specific medicines your doctor prescribes.   ?? Check with your doctor before you use any over-the-counter medicines. Many cold and allergy medicines, for example, can make urinary retention worse. Make sure your doctor knows all of the medicines, vitamins, supplements, and herbal remedies you take.   ?? Spread out through the day the amount of fluid you drink. Do not drink a lot at bedtime.   ?? Avoid alcohol and caffeine.   ?? If you have been given a catheter, or if a catheter is already in place, follow the instructions you were given. Always wash your hands before and after you handle the catheter.   When should you call for help?  Call your doctor now or seek immediate medical care if:  ?? You cannot urinate at all, or it is getting harder to urinate.   ?? You have symptoms of a urinary tract infection. These may include:   ?? Pain or burning when you urinate.   ?? A frequent need to urinate without being able to pass much urine.   ?? Pain in the flank, which is just below the rib cage and above the waist on either side of the back.   ?? Blood in your urine.   ?? A fever.   Watch closely for changes in your health, and be sure to contact your doctor if:  ?? You have any problems with your catheter.   ?? You do not get better as expected.     Where can you learn more?    Go to [email or web address]   Enter M244 in the search  box to learn more about "Urinary Retention: After Your Visit."    ?? 2006-2013 Healthwise, Incorporated. Care instructions adapted under license by [employer] (which disclaims liability or warranty for this information). This care instruction is for use with your licensed healthcare professional. If you have questions about a medical condition or this instruction, always ask your healthcare professional. Healthwise, Incorporated disclaims any warranty or liability  for your use of this information.  Content Version: 9.7.130178; Last Revised: April 19, 2011

## 2012-06-06 ENCOUNTER — Encounter

## 2012-06-06 LAB — URINALYSIS W/ RFLX MICROSCOPIC
Bilirubin: NEGATIVE
Blood: NEGATIVE
Glucose: NEGATIVE
Ketone: NEGATIVE
Leukocyte Esterase: NEGATIVE
Nitrites: NEGATIVE
Specific Gravity: >=1.03 — AB (ref 1.005–1.030)
Urobilinogen: 0.2 mg/dL (ref 0.0–1.9)
pH (UA): 6 (ref 5.0–7.5)

## 2012-06-06 LAB — MICROSCOPIC EXAMINATION

## 2012-06-07 LAB — CULTURE, URINE

## 2012-06-07 MED ORDER — VALSARTAN 320 MG TAB
320 mg | ORAL_TABLET | Freq: Every day | ORAL | Status: DC
Start: 2012-06-07 — End: 2012-09-30

## 2012-06-07 NOTE — Telephone Encounter (Signed)
Pt needs refills sent to [facility].

## 2012-06-10 ENCOUNTER — Encounter

## 2012-06-10 LAB — AMB POC URINALYSIS DIP STICK AUTO W/ MICRO
Bilirubin (UA POC): NEGATIVE
Blood (UA POC): NEGATIVE
Glucose (UA POC): NEGATIVE
Ketones (UA POC): NEGATIVE
Leukocyte esterase (UA POC): NEGATIVE
Nitrites (UA POC): NEGATIVE
Protein (UA POC): NEGATIVE mg/dL
Specific gravity (UA POC): 1.025 (ref 1.001–1.035)
Urobilinogen (UA POC): 0.2 (ref 0.2–1)
pH (UA POC): 5.5 (ref 4.6–8.0)

## 2012-06-10 MED ORDER — AMLODIPINE 10 MG TAB
10 mg | ORAL_TABLET | Freq: Every day | ORAL | Status: DC
Start: 2012-06-10 — End: 2012-09-09

## 2012-06-10 MED ORDER — DOCUSATE SODIUM 100 MG CAP
100 mg | ORAL_CAPSULE | Freq: Every day | ORAL | Status: DC
Start: 2012-06-10 — End: 2013-09-13

## 2012-06-10 MED ORDER — ESTRADIOL 0.01% (0.1 MG/G) VAGINAL CREAM
0.01 % (0.1 mg/gram) | VAGINAL | Status: DC
Start: 2012-06-10 — End: 2013-06-13

## 2012-06-10 MED ORDER — ASPIRIN 81 MG TAB
81 mg | ORAL_TABLET | Freq: Every day | ORAL | Status: DC
Start: 2012-06-10 — End: 2012-08-08

## 2012-06-10 NOTE — Patient Instructions (Signed)
MyChart Activation    Thank you for requesting access to MyChart. Please follow the instructions below to securely access and download your online medical record. MyChart allows you to send messages to your doctor, view your test results, renew your prescriptions, schedule appointments, and more.    How Do I Sign Up?    1. In your internet browser, go to [email or web address].  2. Click on the First Time User? Click Here link in the Sign In box. You will see the New Member Sign Up page.  3. Enter your MyChart Access Code exactly as it appears below. You will not need to use this code after you???ve completed the sign-up process. If you do not sign up before the expiration date, you must request a new code.    MyChart Access Code: [email or web address]  Expires: 09/08/2012 11:09 AM (This is the date your MyChart access code will expire)    4. Enter the last four digits of your Social Security Number (xxxx) and Date of Birth (mm/dd/yyyy) as indicated and click Submit. You will be taken to the next sign-up page.  5. Create a MyChart ID. This will be your MyChart login ID and cannot be changed, so think of one that is secure and easy to remember.  6. Create a MyChart password. You can change your password at any time.  7. Enter your Password Reset Question and Answer. This can be used at a later time if you forget your password.   8. Enter your e-mail address. You will receive e-mail notification when new information is available in MyChart.  9. Click Sign Up. You can now view and download portions of your medical record.  10. Click the Download Summary menu link to download a portable copy of your medical information.    Additional Information    If you have questions, please visit the Frequently Asked Questions section of the MyChart website at [email or web address] Remember, MyChart is NOT to be used for urgent needs. For medical emergencies, dial 911.

## 2012-06-10 NOTE — Telephone Encounter (Signed)
Please send refills to pt's [facility] pharmacy.  Refills were sent to [facility] by mistake.  Dr. Lily Lovings out on vacation.  Can Dr. Ladona Ridgel please refill meds because pt is totally out.

## 2012-06-10 NOTE — Progress Notes (Signed)
Chelsea Schultz is a 78 y.o. female and presents with Recurrent UTI  . This patient was sent to me to render an opinion regarding her   1. UTI (lower urinary tract infection)    2. Cystitis, unspecified    3. Mixed incontinence urge and stress (female)(female)    4. Postmenopausal atrophic vaginitis    5. Neurogenic bladder, NOS        PCP: Chelsea Slim, MD      No Known Allergies  Current outpatient prescriptions:docusate sodium (DOC-Q-LACE) 100 mg capsule, Take 1 Cap by mouth daily., Disp: 30 Cap, Rfl: 5;  aspirin 81 mg tablet, Take 1 Tab by mouth daily., Disp: 30 Tab, Rfl: 5;  amLODIPine (NORVASC) 10 mg tablet, Take 1 Tab by mouth daily., Disp: 30 Tab, Rfl: 1  estradiol (ESTRACE) 0.01 % (0.1 mg/gram) vaginal cream, Apply pea sized amount to vagina and vulva with fingertip 3 times per week. Throw away the applicator.  Indications: ATROPHIC VAGINITIS ASSOCIATED WITH MENOPAUSE, Disp: 42.5 g, Rfl: 0;  valsartan (DIOVAN) 320 mg tablet, Take 1 Tab by mouth daily., Disp: 30 Tab, Rfl: 3;  dipyridamole-aspirin (AGGRENOX) 200-25 mg per SR capsule, Take 1 Cap by mouth two (2) times a day., Disp: , Rfl:   MV-MN/FA/D3/LYCOPENE/LUT/COQ10 (DAILY MULTIVITAMIN PO), Take  by mouth daily., Disp: , Rfl: ;  ascorbic acid (VITAMIN Chelsea Schultz) 250 mg tablet, Take  by mouth two (2) times a day., Disp: , Rfl: ;  omeprazole (PRILOSEC) 20 mg capsule, Take 1 Cap by mouth daily., Disp: 90 Cap, Rfl: 0;  pregabalin (LYRICA) 100 mg capsule, Take 1 Cap by mouth two (2) times a day., Disp: 30 Cap, Rfl: 3  sitaGLIPtin (JANUVIA) 100 mg tablet, Take 1 Tab by mouth daily., Disp: 30 Tab, Rfl: 3;  potassium chloride (K-DUR, KLOR-CON) 20 mEq tablet, Take 1 Tab by mouth daily., Disp: 90 Tab, Rfl: 1;  HYDROcodone-acetaminophen (LORTAB) 2.5-500 mg per tablet, Take 1 Tab by mouth every six (6) hours as needed for Pain., Disp: 30 Tab, Rfl: 2;  metFORMIN (GLUCOPHAGE) 500 mg tablet, Take 2 Tabs by mouth two (2) times daily (with meals)., Disp: 120 Tab,  Rfl: 3  calcium-cholecalciferol, D3, (CALCIUM 600 + D) tablet, Take 2 Tabs by mouth daily., Disp: 60 Tab, Rfl: 12;  ibuprofen (MOTRIN) 800 mg tablet, Take 1 Tab by mouth every eight (8) hours as needed for Pain., Disp: 30 Tab, Rfl: 2;  Comp.Stocking,Knee,Regular,Lrg Misc, 1 Each by Does Not Apply route daily., Disp: 1 Each, Rfl: 3;  Psyllium Husk-Sucrose (METAMUCIL) 3.4 gram/7 gram Powd, Take 1 Cap by mouth daily., Disp: 368 g, Rfl: 3  Reviewed with patient at this visit.    Past Medical History   Diagnosis Date   ??? Diabetes    ??? Hypertension    ??? Neuropathy    ??? Spinal stenosis    ??? CVA (cerebral infarction)    ??? GERD (gastroesophageal reflux disease)    ??? Nausea & vomiting    ??? Arthritis    ??? Osteopetrosis      Past Surgical History   Procedure Date   ??? Hx orthopaedic      Lumbar laminectomy   ??? Hx orthopaedic      Lumbar fusion     Family History   Problem Relation Age of Onset   ??? Diabetes Mother    ??? Stroke Mother    ??? Hypertension Mother    ??? Hypertension Father    ??? Stroke Father    ???  Cancer Sister    ??? Stroke Sister      History     Social History   ??? Marital Status: UNKNOWN     Spouse Name: N/A     Number of Children: N/A   ??? Years of Education: N/A     Social History Main Topics   ??? Smoking status: Never Smoker    ??? Smokeless tobacco: Never Used   ??? Alcohol Use: No   ??? Drug Use: No   ??? Sexually Active: No     Other Topics Concern   ??? Not on file     Social History Narrative   ??? No narrative on file     OB History     Grav Para Term Preterm Abortions TAB SAB Ect Mult Living                     Reviewed with patient at this visit.    Physical Exam:  BP 114/72   Pulse 80   Ht 4\' 8"  (1.422 m)   Wt 119 lb (53.978 kg)   BMI 26.68 kg/m2   LMP 10/23/1978          PVR: PVR: Cath (20 ml)     Orders Placed This Encounter   ??? AMB POC URINALYSIS DIP STICK AUTO W/ MICRO   ??? estradiol (ESTRACE) 0.01 % (0.1 mg/gram) vaginal cream     Sig: Apply pea sized amount to vagina and vulva with fingertip 3 times per week. Throw  away the applicator.  Indications: ATROPHIC VAGINITIS ASSOCIATED WITH MENOPAUSE     Dispense:  42.5 g     Refill:  0     Okay to sub for formulary alternative     HPI see note below    Review of Systems   Constitutional: Positive for malaise/fatigue. Negative for fever, chills, weight loss and diaphoresis.   HENT: Positive for hearing loss and tinnitus. Negative for ear pain, nosebleeds, congestion, sore throat and ear discharge.         Mild sinus trouble, slight hearing loss   Eyes: Negative for blurred vision, double vision, photophobia, pain, discharge and redness.        Pt ears eyeglasses and has cataracts   Respiratory: Negative for cough, hemoptysis, sputum production, shortness of breath, wheezing and stridor.    Cardiovascular: Negative for chest pain, palpitations, orthopnea, claudication, leg swelling and PND.        While hospitalized in June 2013 pt had ankle swelling and fainting spell   Gastrointestinal: Positive for heartburn and nausea. Negative for vomiting, abdominal pain, diarrhea, constipation, blood in stool and melena.   Genitourinary: Positive for frequency. Negative for dysuria, urgency, hematuria and flank pain.        Bladder leakage   Musculoskeletal: Negative for joint pain and falls.        Numbness and loss of strength  Muscle pain and stiffness   Skin: Negative for itching and rash.   Neurological: Negative for dizziness, tingling, tremors, sensory change, speech change, focal weakness, seizures, loss of consciousness and headaches.   Endo/Heme/Allergies: Negative for environmental allergies and polydipsia. Does not bruise/bleed easily.        Cold intolerance   Psychiatric/Behavioral: Positive for memory loss. Negative for depression, suicidal ideas, hallucinations and substance abuse. The patient is not nervous/anxious and does not have insomnia.        Physical Exam see dictationMs. Schultz is an 78 year old status post seven  vaginal deliveries. She had a history of a  cerebrovascular accident in June 2013, after undergoing lower back surgery and currently walks with a walker at home and uses a wheelchair for longer distances.    History of Present Illness:  She has been complaining of urinary incontinence for several years. She has not tried Kegel exercises or medication.  She uses three Depends a day, but is able to get to the bathroom at night. She denies dysuria, reports urinary frequency and urgency, denies post void fullness. Has symptoms of both stress as well as urge incontinence.  She denies symptoms of pelvic organ prolapse, constipation, hematuria, or enuresis. She has no family history of kidney stones. She denies postmenopausal bleeding, and reports she has had normal Paps throughout her life. Her daughter and primary care physician are concerned that her urinary incontinence is leading to urinary tract infection from sitting in a wet Depends.  She had a urinary tract infection diagnosed in June on her repeat preoperative screen and then another urinary tract infection diagnosed on follow up with her primary care doctor.  Patient denies symptoms of a UTI both times.      History, medications and allergies are as noted above.      Exam:  GENERAL:  well-developed, well-nourished, sitting in a wheelchair. She is well groomed.  NEURO:  Oriented to person, time and place.  Positive anal wink, positive bulbocavernosus reflex.  No paradoxical movements of her pelvic floor muscles and she is able to move full strength.   No dysesthesia or paresthesia to light touch to a Q-tip in S2-S4 distribution.  PSYCH:  Mood and affect normal.  LUNGS:  Bilateral crackles mid way up the lung field, which do not clear with coughing.  Normal expiratory effort.   CV:  Heart regular rate and rhythm.   EXTREMITIES:  Well perfused with no clubbing or cyanosis.   ABD:  Soft, nontender, nondistended.  No apparent hernia or hepatosplenomegaly.    GU:  External genitalia atrophic introitus atrophic.   Perineal body intact. Urethral meatus normal. Urethra normal, nontender.  No palpable masses.  Bladder normal.  Vaginal atrophic without lesions.  Cervix is regressed.  She has three Nabothian cysts.  Uterus non palpable.  Adnexa non palpable.    BACK: No CVA tenderness.  MSK:  Pelvic floor muscle strength 1/5 bilaterally. No contraction, banding or tenderness over the levators.      Assessment:  1. She has symptoms of both stress as well as urge urinary incontinence.  Her incontinence symptoms antedate her cerebrovascular accident. She is currently wearing pads.  We discussed treatment options for both stress incontinence as well as urge incontinence.  As primary treatment, physical therapy and behavior modification, it is reasonable to start there and she agrees.  A prescription for physical therapy with modalities as needed were given. Should her symptoms persist after physical therapy or she is unhappy with her results, we will proceed with urodynamic testing given her history of stroke and treatment plan will be made from there. Currently, she is emptying her bladder well, as evidenced by postvoid residual.   2. Urinary tract infection. She has had two diagnosed in the last year, neither by culture.  We discussed common causes and treatments.  We also discussed frequency of changing pads. Her peroneal skin looks good, however there could be a theoretical risk of sitting in a wet pad, so we discussed she should change it once she notices she has had an accident.  I started her on estrogen vaginal cream to help increase her resistance to bacteria, and we briefly discussed good blood sugar control as a keystone in preventing urinary tract infections.  She will be mindful to notice if she has symptoms, and we will treat it from there.  3. Bilateral basilar crackles. Her daughter reports she was on a diuretic and this has recently been stopped. I strongly encouraged them to follow up with Dr. Lily Lovings ASAP for further  evaluation and treatment.    4. Atrophic vaginitis/atrophic vulvitis.  I prescribed estrogen cream fingertip application three times a week. Written instructions were given on how to use this.    Total visit time was 50 minutes, 40 minutes of which were spent in direct face to face counseling regarding the above problems and treatment plan.      MedDATA/kse            @[facility] @     Studies/tests/radiology reviewed.    ASSESSMENT and PLAN

## 2012-06-13 NOTE — Progress Notes (Signed)
HISTORY OF PRESENT ILLNESS  Chelsea Schultz is a 78 y.o. female.  HPI  1. Here to for follow up.  Chelsea Schultz was here a few weeks ago and did not appear herself after a recent hospitalization for a syncopal episode.  apparently she had a UTI.  Culture grew Klebsiella on that was not sensitive to antibiotic she was originally being treated for.  She is here with her daughter to try and establish a neurological cause for her syncopal episode.  Cardiac reasons have been ruled out.  She also has been having urinary symptoms of incontinence and wears bladder pads.  These may be the reasons for her frequent UTI's, but I am referring her to Dr. Veda Canning to establish a cause.  If any.   The daughters also want her to be evaluated for home health for help with medication and ADL;s.  Her daughter is a physician --psychiatrist with the [facility] and is not home to administer and monitor if she is taking her medication.  There appears to be some memory loss.    Results for orders placed in visit on 09/29/11   NMR LIPOPROFILE     Status: Abnormal       Component Value Range Status Comment    LDL-P 1295 (*) <1000 nmol/L Final     LDL-C 110 (*) <100 mg/dL Final     HDL-C 57  >=16 mg/dL Final     Triglycerides 44  <150 mg/dL Final     Cholesterol, Total 176  <200 mg/dL Final     HDL-P (Total) 33.8  >=30.5 umol/L Final     Small LDL-P 104  <=527 nmol/L Final     LDL size 21.1  >20.5 nm Final     LP-IR SCORE 10  <=45 Final        Lab Results   Component Value Date/Time    Hemoglobin A1c 6.1 05/23/2012 12:54 PM     Lab Results   Component Value Date/Time    Sodium 137 05/23/2012 12:54 PM    Potassium 4.1 05/23/2012 12:54 PM    Chloride 100 05/23/2012 12:54 PM    CO2 24 05/23/2012 12:54 PM    Anion gap 5 04/02/2012  8:15 PM    Glucose 88 05/23/2012 12:54 PM    BUN 15 05/23/2012 12:54 PM    Creatinine 0.58 05/23/2012 12:54 PM    BUN/Creatinine ratio 26 05/23/2012 12:54 PM    GFR est-AA >60 04/02/2012  8:15 PM    GFR est non-AA 85 05/23/2012 12:54  PM    Calcium 10.9 05/23/2012 12:54 PM    Bilirubin, total 0.2 05/23/2012 12:54 PM    ALT 10 05/23/2012 12:54 PM    AST 18 05/23/2012 12:54 PM    Alk. phosphatase 66 05/23/2012 12:54 PM    Protein, total 6.7 05/23/2012 12:54 PM    Albumin 4.0 05/23/2012 12:54 PM    Globulin 4.1 04/02/2012  8:15 PM    A-G Ratio 1.5 05/23/2012 12:54 PM           Current Outpatient Prescriptions   Medication Sig Dispense Refill   ??? dipyridamole-aspirin (AGGRENOX) 200-25 mg per SR capsule Take 1 Cap by mouth two (2) times a day.       ??? MV-MN/FA/D3/LYCOPENE/LUT/COQ10 (DAILY MULTIVITAMIN PO) Take  by mouth daily.       ??? ascorbic acid (VITAMIN C) 250 mg tablet Take  by mouth two (2) times a day.       ???  omeprazole (PRILOSEC) 20 mg capsule Take 1 Cap by mouth daily.  90 Cap  0   ??? pregabalin (LYRICA) 100 mg capsule Take 1 Cap by mouth two (2) times a day.  30 Cap  3   ??? sitaGLIPtin (JANUVIA) 100 mg tablet Take 1 Tab by mouth daily.  30 Tab  3   ??? potassium chloride (K-DUR, KLOR-CON) 20 mEq tablet Take 1 Tab by mouth daily.  90 Tab  1   ??? HYDROcodone-acetaminophen (LORTAB) 2.5-500 mg per tablet Take 1 Tab by mouth every six (6) hours as needed for Pain.  30 Tab  2   ??? metFORMIN (GLUCOPHAGE) 500 mg tablet Take 2 Tabs by mouth two (2) times daily (with meals).  120 Tab  3   ??? calcium-cholecalciferol, D3, (CALCIUM 600 + D) tablet Take 2 Tabs by mouth daily.  60 Tab  12   ??? docusate sodium (DOC-Q-LACE) 100 mg capsule Take 1 Cap by mouth daily.  30 Cap  5   ??? aspirin 81 mg tablet Take 1 Tab by mouth daily.  30 Tab  5   ??? amLODIPine (NORVASC) 10 mg tablet Take 1 Tab by mouth daily.  30 Tab  1   ??? estradiol (ESTRACE) 0.01 % (0.1 mg/gram) vaginal cream Apply pea sized amount to vagina and vulva with fingertip 3 times per week. Throw away the applicator.  Indications: ATROPHIC VAGINITIS ASSOCIATED WITH MENOPAUSE  42.5 g  0   ??? valsartan (DIOVAN) 320 mg tablet Take 1 Tab by mouth daily.  30 Tab  3   ??? ibuprofen (MOTRIN) 800 mg tablet Take 1 Tab by mouth every eight  (8) hours as needed for Pain.  30 Tab  2   ??? Comp.Stocking,Knee,Regular,Lrg Misc 1 Each by Does Not Apply route daily.  1 Each  3   ??? Psyllium Husk-Sucrose (METAMUCIL) 3.4 gram/7 gram Powd Take 1 Cap by mouth daily.  368 g  3      History     Social History   ??? Marital Status: UNKNOWN     Spouse Name: N/A     Number of Children: N/A   ??? Years of Education: N/A     Social History Main Topics   ??? Smoking status: Never Smoker    ??? Smokeless tobacco: Never Used   ??? Alcohol Use: No   ??? Drug Use: No   ??? Sexually Active: No     Other Topics Concern   ??? Not on file     Social History Narrative   ??? No narrative on file      Family History   Problem Relation Age of Onset   ??? Diabetes Mother    ??? Stroke Mother    ??? Hypertension Mother    ??? Hypertension Father    ??? Stroke Father    ??? Cancer Sister    ??? Stroke Sister      No Known Allergies   Past Medical History   Diagnosis Date   ??? Diabetes    ??? Hypertension    ??? Neuropathy    ??? Spinal stenosis    ??? CVA (cerebral infarction)    ??? GERD (gastroesophageal reflux disease)    ??? Nausea & vomiting    ??? Arthritis    ??? Osteopetrosis       Blood pressure 126/52, pulse 95, temperature 98.5 ??F (36.9 ??C), temperature source Oral, resp. rate 16, height 4\' 7"  (1.397 m), weight 118 lb 8 oz (53.751 kg),  last menstrual period 10/23/1978.     Review of Systems   Constitutional: syncopy  HENT: Negative for hearing loss, ear pain, congestion, sore throat and ear discharge.    Eyes: Negative.  Negative for blurred vision and double vision.   Respiratory: Negative for cough, sputum production and shortness of breath.    Cardiovascular: Negative.  Negative for chest pain and palpitations.   Gastrointestinal: Negative for heartburn, nausea, vomiting, abdominal pain and constipation.   Genitourinary: Negative for dysuria and urgency.   Musculoskeletal: Positive for joint pain (lower leg cramps). Negative for myalgias.   Skin: Negative for rash.   Neurological: Negative for dizziness, tremors, loss of  consciousness and headaches.   Endo/Heme/Allergies: Negative for polydipsia.   Psychiatric/Behavioral: memory loss  Physical Exam   Blood pressure 126/52, pulse 95, temperature 98.5 ??F (36.9 ??C), temperature source Oral, resp. rate 16, height 4\' 7"  (1.397 m), weight 118 lb 8 oz (53.751 kg), last menstrual period 10/23/1978.    Constitutional: She is oriented to person, place, and time. She appears well-developed and well-nourished.   HENT:   Head: Normocephalic and atraumatic.   Mouth/Throat: Oropharynx is clear and moist.   Eyes: Conjunctivae and EOM are normal. Pupils are equal, round, and reactive to light.   Neck: Normal range of motion. Neck supple.   Cardiovascular: Normal rate, regular rhythm and normal heart sounds.  Exam reveals no gallop and no friction rub.    No murmur heard.  Pulmonary/Chest: Effort normal and breath sounds normal. No respiratory distress. She has no wheezes. She has no rales.   Abdominal: Soft. Bowel sounds are normal. She exhibits no distension. There is no tenderness.   Musculoskeletal: She exhibits no edema and no tenderness.   Neurological: She is alert and oriented to person, place, and time. She has normal reflexes. No cranial nerve deficit. Coordination normal.   Skin: Skin is warm and dry.   Psychiatric: some memory loss, but improved from last visit  ASSESSMENT and PLAN  1. Syncope  REFERRAL TO NEUROLOGY   2. Back pain with radiation  REFERRAL TO HOME HEALTH   3. Urinary tract infection due to ESBL Klebsiella  URINALYSIS W/ RFLX MICROSCOPIC, CULTURE, URINE   4. Urinary incontinence  REFERRAL TO GYNECOLOGY, METABOLIC PANEL, COMPREHENSIVE   5. Urinary retention  METABOLIC PANEL, COMPREHENSIVE       If you experience chest pain call 911. Do not drive yourself.

## 2012-07-05 NOTE — Telephone Encounter (Signed)
FYI:  Home health care nurse, Jan called from [facility] to advise Dr. Lily Lovings that pt is no longer in need of services and was discharged after her visit today.  If Dr. Lily Lovings or her nurse has questions, please contact Jan at [phone].

## 2012-07-08 NOTE — Telephone Encounter (Signed)
No questions for now.  Agree with plan

## 2012-08-02 ENCOUNTER — Encounter

## 2012-08-05 ENCOUNTER — Encounter

## 2012-08-05 NOTE — Telephone Encounter (Signed)
Please send refill to local [facility] pharmacy.

## 2012-08-12 NOTE — Telephone Encounter (Signed)
Message copied by Maudry Diego on Mon Aug 12, 2012 11:17 AM  ------       Message from: Luanne Bras       Created: Mon Aug 12, 2012 10:38 AM       Regarding: Non-Urgent Medical Question       Contact: [phone]         Need prescription refill, Aggrenox 25MG /200MG  capsules - take one capsule by mouth twice daily.                This prescription not on prescription list above, but should  be in system.  Recorded by nurse during last PCP visit.  Original script by Margarita Grizzle, MD while in rehab, post-laminectomy and stroke in 04/2012, you have since ordered.  [facility] continues to fax requests to wrong practice, ie [facility].    Thanks.  ------

## 2012-08-14 NOTE — ED Notes (Signed)
Family states she is here for an outpatient xray, does not wish to be seen as an ER patient.

## 2012-08-15 ENCOUNTER — Encounter

## 2012-08-26 ENCOUNTER — Encounter

## 2012-08-28 NOTE — Telephone Encounter (Signed)
Called into patient's pharmacy Lyrica 100 mg caps, as ordered by Dr. Lily Lovings, because this medication order did not go electronically to her pharmacy.

## 2012-09-09 ENCOUNTER — Encounter

## 2012-09-09 NOTE — Telephone Encounter (Signed)
Please send refills to local [facility] pharmacy.

## 2012-10-07 NOTE — Telephone Encounter (Signed)
Please send refill to local [facility] pharmacy.

## 2012-10-11 NOTE — Telephone Encounter (Signed)
Message copied by Yolanda Bonine on Fri Oct 11, 2012  2:27 PM  ------       Message from: Rondall Allegra       Created: Fri Oct 11, 2012 11:47 AM       Regarding: Dr. Leodis Sias: [phone]         Marchelle Folks from [facility] advised that she would like to speak to a nurse regarding patient's "Diovan" prescription. Best contact number is [phone]  ------

## 2012-10-13 NOTE — Telephone Encounter (Signed)
Please call pharmacy

## 2012-10-14 NOTE — Telephone Encounter (Signed)
Pt's RX has been clarified.

## 2012-10-31 ENCOUNTER — Encounter

## 2012-10-31 NOTE — Telephone Encounter (Signed)
Please send refill to pt's [facility] pharmacy.

## 2013-01-01 ENCOUNTER — Encounter

## 2013-01-02 ENCOUNTER — Encounter

## 2013-01-03 ENCOUNTER — Encounter

## 2013-01-10 NOTE — Progress Notes (Signed)
Chelsea Schultz is a 79 y.o. female with hx CVA, DM, HTN, neuropathy, GERD, osteopetrosis, spinal stenosis who presents for Preop evaluation of spinal decompression surgery from from Dr. Mason Jim. Pt has had weakness in b/l LE for several years.  Had a laminectomy in June of last year.  Laminectomy helped, but pt experienced increasing weakness since.  Pt c/o rash located on the posterior neck that began behind her R ear. She noticed it ~three weeks ago and thinks it is spreading.  It is itchy, she has treated with OTC benadryl and hydrocortisone cream x2w with no relief.      Type of surgery : spinal decompression  Surgery site : spine  Anesthesia type: General  Date of procedure:  01/22/2013    Allergies: No Known Allergies  Latex allergy: No  Prior reactions to anesthesia:  None    Functional status:  she is able to ambulate up a flight of step with no shortness of breath, chest pain  Prior cardiac evaluation:   03/08/2012    PMHx:  Past Medical History   Diagnosis Date   ??? Diabetes    ??? Hypertension    ??? Neuropathy    ??? Spinal stenosis    ??? CVA (cerebral infarction)    ??? GERD (gastroesophageal reflux disease)    ??? Nausea & vomiting    ??? Arthritis    ??? Osteopetrosis        Meds:   Current Outpatient Prescriptions   Medication Sig Dispense Refill   ??? pregabalin (LYRICA) 100 mg capsule Take 1 Cap by mouth two (2) times a day.  30 Cap  3   ??? potassium chloride (K-DUR, KLOR-CON) 20 mEq tablet Take 1 Tab by mouth daily.  90 Tab  1   ??? DIOVAN 320 mg tablet TAKE 1 TABLET BY MOUTH DAILY  30 Tab  0   ??? DIOVAN 320 mg tablet TAKE 1 TABLET BY MOUTH DAILY  30 Tab  3   ??? DIOVAN 320 mg tablet TAKE 1 TABLET BY MOUTH DAILY  30 Tab  0   ??? DIOVAN 320 mg tablet TAKE 1 TABLET BY MOUTH DAILY  30 Tab  0   ??? amLODIPine (NORVASC) 10 mg tablet Take 1 Tab by mouth daily.  30 Tab  3   ??? omeprazole (PRILOSEC) 20 mg capsule Take 1 Cap by mouth daily.  90 Cap  3   ??? dipyridamole-aspirin (AGGRENOX) 200-25 mg per SR capsule Take 1 Cap by  mouth two (2) times a day.  60 Cap  3   ??? sitaGLIPtin (JANUVIA) 100 mg tablet Take 1 Tab by mouth daily.  30 Tab  3   ??? mv-mn-FA-D3-lycopene-lut-coQ10 (DAILY MULTIVITAMIN) 200-2,000-500 mcg-unit-mcg cap Take 200 mcg by mouth daily.  30 Cap  4   ??? docusate sodium (DOC-Q-LACE) 100 mg capsule Take 1 Cap by mouth daily.  30 Cap  5   ??? estradiol (ESTRACE) 0.01 % (0.1 mg/gram) vaginal cream Apply pea sized amount to vagina and vulva with fingertip 3 times per week. Throw away the applicator.  Indications: ATROPHIC VAGINITIS ASSOCIATED WITH MENOPAUSE  42.5 g  0   ??? ascorbic acid (VITAMIN C) 250 mg tablet Take  by mouth two (2) times a day.       ??? ibuprofen (MOTRIN) 800 mg tablet Take 1 Tab by mouth every eight (8) hours as needed for Pain.  30 Tab  2   ??? HYDROcodone-acetaminophen (LORTAB) 2.5-500 mg per tablet Take 1 Tab  by mouth every six (6) hours as needed for Pain.  30 Tab  2   ??? metFORMIN (GLUCOPHAGE) 500 mg tablet Take 2 Tabs by mouth two (2) times daily (with meals).  120 Tab  3   ??? Comp.Stocking,Knee,Regular,Lrg Misc 1 Each by Does Not Apply route daily.  1 Each  3   ??? calcium-cholecalciferol, D3, (CALCIUM 600 + D) tablet Take 2 Tabs by mouth daily.  60 Tab  12   ??? Psyllium Husk-Sucrose (METAMUCIL) 3.4 gram/7 gram Powd Take 1 Cap by mouth daily.  368 g  3   No metamucil, Lortab only with pain, d/c estradiol cream    Allergies:   No Known Allergies    Smoker:  History   Smoking status   ??? Never Smoker    Smokeless tobacco   ??? Never Used       ETOH:   History   Alcohol Use No       FH:   Family History   Problem Relation Age of Onset   ??? Diabetes Mother    ??? Stroke Mother    ??? Hypertension Mother    ??? Hypertension Father    ??? Stroke Father    ??? Cancer Sister    ??? Stroke Sister    Sister: breast CA     ROS:  General/Constitutional:   No headache, fever, fatigue, weight loss or weight gain       Eyes:   No redness, pruritis, pain, visual changes, swelling, or discharge      Ears:    No pain, loss or changes in hearing      Neck:   No swelling, masses, stiffness, pain, or limited movement     Cardiac:    No chest pain      Respiratory:   No cough or shortness of breath     GI:   No nausea/vomiting, diarrhea, abdominal pain, bloody or dark stools       GU:   No dysuria or  hematuria    Neurological:   No loss of consciousness, dizziness, seizures, dysarthria, cognitive changes, memory changes,  problems with balance, or unilateral weakness     Skin: rash to posterior neck that is pruritic    Physical Exam:  BP 132/64   Pulse 81   Temp(Src) 97.5 ??F (36.4 ??C) (Oral)   Resp 18   Ht 4\' 8"  (1.422 m)   Wt 124 lb 6.4 oz (56.427 kg)   BMI 27.91 kg/m2   LMP 10/23/1978  General appearance: alert, cooperative, no distress, appears stated age  Head: Normocephalic, without obvious abnormality, atraumatic  Eyes: conjunctivae/corneas clear. PERRL, EOM's intact.   Ears: normal TM's and external ear canals AU  Nose: Nares normal. Septum midline. Mucosa normal. No drainage or sinus tenderness.  Throat: Lips, mucosa, and tongue normal. Teeth and gums normal  Neck: supple, symmetrical, trachea midline, no adenopathy, thyroid: not enlarged, symmetric, no tenderness/mass/nodules, no carotid bruit and no JVD  Back: symmetric, no curvature. ROM normal. No CVA tenderness.  Lungs: clear to auscultation bilaterally, no wheezes, no increased work of breathing  Heart: regular rate and rhythm, S1, S2 normal, no murmur, click, rub or gallop  Abdomen: soft, non-tender. Bowel sounds normal. No masses,  no organomegaly  Extremities: extremities normal, atraumatic, no cyanosis or edema  Pulses: 2+ and symmetric  Skin: Skin color, texture, turgor normal. Eczematous rash behind ears b/l.  Lymph nodes: Cervical, supraclavicular, and axillary nodes normal.  Neurologic: Alert and oriented  X 3, normal strength and tone. Normal symmetric reflexes. Normal coordination and gait with walker      Relevant Imaging:  01/08/2013:  FINDINGS: Patient is mildly kyphotic. There is  multilevel endplate   degenerative change. Cord signal posterior to T11-T12 again demonstrates   focal increased signal without significant change. There are multiple small   disc bulges and protrusions however this is most significant at T11-T12 with   a diffuse disc bulge and hypertrophy of the facets resulting in severe   narrowing of the canal and moderate bilateral foraminal narrowing. Remaining   disc levels demonstrate no more than mild narrowing of the canal or foramen.   Paraspinal soft tissues are unremarkable.   IMPRESSION:   1. Changes severe canal stenosis T11-T12 with associated cord signal   abnormality. This is not significantly changed in the interval     01/01/2013  FINDINGS:   AP, lateral, flexion, extension, and coned down lateral views of the lumbar   spine demonstrate posterior fusion at L4-5 similar appearance to the prior   study, with grade 1 anterolisthesis at L3-4 L4-5 and L5-S1. Orthopedic   hardware is intact. There is no evidence of acute fracture. There is   osteopenia. There is mild retrolisthesis at L1-2. Chronic T12 compression   fracture again noted. The sacroiliac joints are intact. There is very   limited range of motion on the flexion and extension views with no definite   instability.   IMPRESSION:   Postsurgical changes and degenerative changes similar in appearance to the   prior study. Very limited range of motion with no definite instability.     EKG: NSR 79 BP, no ST/T wave changes. Normal EKG.        Assessment/Plan:    Ms. Leoni is a pleasant 79 y/o F w/ hx CVA, DM, HTN, neuropathy, GERD osteopetrosis, spinal stenosis presenting for preop eval, stable for surgery.       ICD-9-CM    1. Spinal stenosis 724.00 CBC WITH AUTOMATED DIFF     METABOLIC PANEL, BASIC     PROTHROMBIN TIME     PTT, ACTIVATED     MRSA CULTURE ONLY     EKG, 12 LEAD, INITIAL     ASCORBATE CALCIUM (VITAMIN C PO)     XR CHEST PA LAT     AMB POC EKG ROUTINE W/ 12 LEADS, INTER & REP   2. Preop general  physical exam V72.83 CBC WITH AUTOMATED DIFF     METABOLIC PANEL, BASIC     MRSA CULTURE ONLY     EKG, 12 LEAD, INITIAL     XR CHEST PA LAT     AMB POC EKG ROUTINE W/ 12 LEADS, INTER & REP   3. History of CVA (cerebrovascular accident) V12.54 PROTHROMBIN TIME     PTT, ACTIVATED     XR CHEST PA LAT   4. Anticoagulated V58.61 PROTHROMBIN TIME     PTT, ACTIVATED   5. Acute eczema 692.9 hydrocortisone valerate (WEST-CORT) 0.2 % ointment     1. Spinal stenosis/preop physical - medically stable for surgery. Will fax preop labs to Dr. Mason Jim when resulted.  - CBC  - BMP  - PT/PTT/INR  - MRSA  - CXR  - EKG    2. Hx CVA/anticoagulated - on aggrenox. Recommend d/c 7-10 days prior to surgery to limit risk of postoperative bleeding. Recommend Hospitalist consult inpatient for medical management given hx of post-op CVA.    3. Acute eczema - b/l  behind ears, refractory to mild topical OTC steroid and benadryl. Does not appear infectious.  - [place] bid x 10 days, f/u with PCP if no resolution      RTC PRN after surgery    Sanjuana Mae MD  PGY I  01/10/2013

## 2013-01-10 NOTE — Patient Instructions (Addendum)
How to Prepare for Surgery  How do you prepare for surgery?  Surgery can be stressful. This information will help you understand what you can expect. And it will help you safely prepare for surgery.  Follow-up care is a key part of your treatment and safety. Be sure to make and go to all appointments, and call your doctor if you are having problems. It's also a good idea to know your test results and keep a list of the medicines you take.  What happens before surgery?  Preparing for surgery  ?? Understand exactly what surgery is planned, along with the risks, benefits, and other options.  ?? Tell your doctors ALL the medicines, vitamins, supplements, and herbal remedies you take. Some of these can increase the risk of bleeding or interact with anesthesia.  ?? If you take blood thinners, such as warfarin (Coumadin), clopidogrel (Plavix), or aspirin, be sure to talk to your doctor. He or she will tell you if you should stop taking these medicines before your surgery. Make sure that you understand exactly what your doctor wants you to do.  ?? Your doctor will tell you which medicines to take or stop before your surgery. You may need to stop taking certain medicines a week or more before surgery. So talk to your doctor as soon as you can.  ?? If you have an advance directive, let your doctor know. It may include a living will and a durable power of attorney for health care. Bring a copy to the hospital. If don't have one, you may want to prepare one. It lets your doctor and loved ones know your health care wishes. Doctors advise that everyone prepare these papers before any type of surgery or procedure.  What happens on the day of surgery?  ?? Follow the instructions about when to stop eating and drinking. If you don't, your surgery may be canceled. If your doctor told you to take your medicines on the day of surgery, take them with only a sip of water.  ?? Take a bath or shower before coming in for your surgery. Do not  apply lotions, perfumes, deodorants, or nail polish.  ?? Do not shave the surgical site yourself.  ?? Take off all jewelry and piercings. And take out contact lenses, if you wear them.  At the hospital or surgery center  ?? Bring a picture ID.  ?? The area for surgery is often marked to make sure there are no errors.  ?? You will be kept comfortable and safe by your anesthesia provider. The anesthesia may make you sleep. Or it may just numb the area being worked on.  Going home  ?? Be sure you have someone to drive you home. Anesthesia and pain medicine make it unsafe for you to drive.  ?? You will be given more specific instructions about recovering from your surgery. They will cover things like diet, wound care, follow-up care, driving, and getting back to your normal routine.  When should you call your doctor?  ?? You have questions or concerns.  ?? You don't understand how to prepare for your surgery.  ?? You become ill before the surgery (such as fever, flu, or a cold).  ?? You need to reschedule or have changed your mind about having the surgery.   Where can you learn more?   Go to http://www.healthwise.net/[facility]  Enter Q270 in the search box to learn more about "How to Prepare for Surgery."   ?? 2006-2014 Healthwise,   Incorporated. Care instructions adapted under license by [facility] (which disclaims liability or warranty for this information). This care instruction is for use with your licensed healthcare professional. If you have questions about a medical condition or this instruction, always ask your healthcare professional. Healthwise, Incorporated disclaims any warranty or liability for your use of this information.  Content Version: 10.0.270728; Last Revised: Mar 11, 2012

## 2013-01-10 NOTE — Progress Notes (Signed)
.    Chief Complaint   Patient presents with   ??? Other     pre-op for surgery spinal decompresion     .Reviewed record in preparation for visit and have obtained the necessary documentation.    Pt questionnaire scanned in

## 2013-01-11 LAB — METABOLIC PANEL, BASIC
BUN/Creatinine ratio: 21 (ref 11–26)
BUN: 20 mg/dL (ref 8–27)
CO2: 26 mmol/L (ref 19–28)
Calcium: 10.5 mg/dL — ABNORMAL HIGH (ref 8.6–10.2)
Chloride: 103 mmol/L (ref 97–108)
Creatinine: 0.96 mg/dL (ref 0.57–1.00)
GFR est AA: 63 mL/min/{1.73_m2} (ref >59)
GFR est non-AA: 54 mL/min/{1.73_m2} — ABNORMAL LOW (ref >59)
Glucose: 105 mg/dL — ABNORMAL HIGH (ref 65–99)
Potassium: 4.7 mmol/L (ref 3.5–5.2)
Sodium: 141 mmol/L (ref 134–144)

## 2013-01-11 LAB — CBC WITH AUTOMATED DIFF
ABS. BASOPHILS: 0 10*3/uL (ref 0.0–0.2)
ABS. EOSINOPHILS: 0.2 10*3/uL (ref 0.0–0.4)
ABS. IMM. GRANS.: 0 10*3/uL (ref 0.0–0.1)
ABS. MONOCYTES: 1.1 10*3/uL — ABNORMAL HIGH (ref 0.1–0.9)
ABS. NEUTROPHILS: 4.7 10*3/uL (ref 1.4–7.0)
Abs Lymphocytes: 2.3 10*3/uL (ref 0.7–3.1)
BASOPHILS: 1 % (ref 0–3)
EOSINOPHILS: 3 % (ref 0–5)
HCT: 36.8 % (ref 34.0–46.6)
HGB: 11.7 g/dL (ref 11.1–15.9)
IMMATURE GRANULOCYTES: 0 % (ref 0–2)
Lymphocytes: 28 % (ref 14–46)
MCH: 28.1 pg (ref 26.6–33.0)
MCHC: 31.8 g/dL (ref 31.5–35.7)
MCV: 88 fL (ref 79–97)
MONOCYTES: 13 % — ABNORMAL HIGH (ref 4–12)
NEUTROPHILS: 55 % (ref 40–74)
PLATELET: 295 10*3/uL (ref 155–379)
RBC: 4.17 x10E6/uL (ref 3.77–5.28)
RDW: 14.2 % (ref 12.3–15.4)
WBC: 8.3 10*3/uL (ref 3.4–10.8)

## 2013-01-11 LAB — PTT, ACTIVATED: aPTT: 28 s (ref 24–33)

## 2013-01-11 LAB — CKD REPORT: PDF Image: 0

## 2013-01-11 LAB — PROTHROMBIN TIME + INR
INR: 1.1 (ref 0.8–1.2)
Prothrombin time: 11.2 s (ref 9.1–12.0)

## 2013-01-13 ENCOUNTER — Encounter

## 2013-01-13 LAB — MRSA SCREENING CULTURE: MRSA Screening Culture: NEGATIVE

## 2013-01-23 NOTE — Progress Notes (Signed)
I reviewed with the resident the medical history and the resident's findings on the physical examination.  I discussed with the resident the patient's diagnosis and concur with the plan.

## 2013-04-04 NOTE — Telephone Encounter (Signed)
Message copied by Margarito Courser on Fri Apr 04, 2013  4:14 PM  ------       Message from: Clovia Cuff D       Created: Fri Apr 04, 2013  3:27 PM       Regarding: Dr. Leanna Battles         Pt's daughter is requesting a medical release form to be mailed to her asap due to pt has appt on June 27th an records need to be faxed over before that date. Addressed was confirmed also.   ------

## 2013-04-07 NOTE — Telephone Encounter (Signed)
Send Med Release in the mail 04/07/13

## 2013-06-09 ENCOUNTER — Encounter

## 2013-06-13 ENCOUNTER — Inpatient Hospital Stay
Admit: 2013-06-13 | Discharge: 2013-06-20 | Disposition: A | Discharge status: Skilled Nursing Facility | Payer: MEDICARE | Attending: Internal Medicine | Admitting: Internal Medicine

## 2013-06-13 DIAGNOSIS — I6330 Cerebral infarction due to thrombosis of unspecified cerebral artery: Secondary | ICD-10-CM

## 2013-06-13 LAB — CBC WITH AUTOMATED DIFF
ABS. BASOPHILS: 0.1 10*3/uL (ref 0.0–0.1)
ABS. EOSINOPHILS: 0.1 10*3/uL (ref 0.0–0.4)
ABS. LYMPHOCYTES: 1.4 10*3/uL (ref 0.8–3.5)
ABS. MONOCYTES: 1 10*3/uL (ref 0.0–1.0)
ABS. NEUTROPHILS: 12.3 10*3/uL — ABNORMAL HIGH (ref 1.8–8.0)
BASOPHILS: 0 % (ref 0–1)
EOSINOPHILS: 0 % (ref 0–7)
HCT: 38.8 % (ref 35.0–47.0)
HGB: 12.5 g/dL (ref 11.5–16.0)
LYMPHOCYTES: 10 % — ABNORMAL LOW (ref 12–49)
MCH: 26.4 PG (ref 26.0–34.0)
MCHC: 32.2 g/dL (ref 30.0–36.5)
MCV: 82 FL (ref 80.0–99.0)
MONOCYTES: 7 % (ref 5–13)
NEUTROPHILS: 83 % — ABNORMAL HIGH (ref 32–75)
PLATELET: 276 10*3/uL (ref 150–400)
RBC: 4.73 M/uL (ref 3.80–5.20)
RDW: 14.5 % (ref 11.5–14.5)
WBC: 14.8 10*3/uL — ABNORMAL HIGH (ref 3.6–11.0)

## 2013-06-13 LAB — GLUCOSE, POC
Glucose (POC): 189 mg/dL — ABNORMAL HIGH (ref 65–105)
Glucose (POC): 153 mg/dL — ABNORMAL HIGH (ref 65–105)

## 2013-06-13 LAB — METABOLIC PANEL, COMPREHENSIVE
A-G Ratio: 1 — ABNORMAL LOW (ref 1.1–2.2)
ALT (SGPT): 20 U/L (ref 12–78)
AST (SGOT): 23 U/L (ref 15–37)
Albumin: 4 g/dL (ref 3.5–5.0)
Alk. phosphatase: 82 U/L (ref 45–117)
Anion gap: 12 mmol/L (ref 5–15)
BUN/Creatinine ratio: 17 (ref 12–20)
BUN: 11 MG/DL (ref 6–20)
Bilirubin, total: 0.4 MG/DL (ref 0.2–1.0)
CO2: 27 mmol/L (ref 21–32)
Calcium: 10.1 MG/DL (ref 8.5–10.1)
Chloride: 99 mmol/L (ref 97–108)
Creatinine: 0.65 MG/DL (ref 0.45–1.15)
GFR est AA: >60 mL/min/{1.73_m2} (ref >60)
GFR est non-AA: >60 mL/min/{1.73_m2} (ref >60)
Globulin: 4.2 g/dL — ABNORMAL HIGH (ref 2.0–4.0)
Glucose: 194 mg/dL — ABNORMAL HIGH (ref 65–100)
Potassium: 3.4 mmol/L — ABNORMAL LOW (ref 3.5–5.1)
Protein, total: 8.2 g/dL (ref 6.4–8.2)
Sodium: 138 mmol/L (ref 136–145)

## 2013-06-13 LAB — EKG, 12 LEAD, INITIAL
Atrial Rate: 375 {beats}/min
Calculated P Axis: 44 degrees
Calculated R Axis: 31 degrees
Calculated T Axis: -124 degrees
Q-T Interval: 196 ms
QRS Duration: 76 ms
QTC Calculation (Bezet): 274 ms
Ventricular Rate: 118 {beats}/min

## 2013-06-13 LAB — URINALYSIS W/ REFLEX CULTURE
Bacteria: NEGATIVE /hpf
Bilirubin: NEGATIVE
Glucose: 500 mg/dL — AB
Leukocyte Esterase: NEGATIVE
Nitrites: NEGATIVE
Protein: 30 mg/dL — AB
Specific gravity: 1.013 (ref 1.003–1.030)
Urobilinogen: 0.2 EU/dL (ref 0.2–1.0)
pH (UA): 7.5 (ref 5.0–8.0)

## 2013-06-13 LAB — HEMOGLOBIN A1C WITH EAG
Est. average glucose: 140 mg/dL
Hemoglobin A1c: 6.5 % — ABNORMAL HIGH (ref 4.2–6.3)

## 2013-06-13 LAB — POC LACTIC ACID: Lactic Acid (POC): 2.1 mmol/L — ABNORMAL HIGH (ref 0.4–2.0)

## 2013-06-13 MED ADMIN — 0.9% sodium chloride infusion: INTRAVENOUS | @ 17:00:00 | NDC 00409798309

## 2013-06-13 MED ADMIN — insulin lispro (HUMALOG) injection: SUBCUTANEOUS | @ 21:00:00 | NDC 99999192301

## 2013-06-13 MED ADMIN — lorazepam (ATIVAN) injection 0.5 mg: INTRAVENOUS | @ 19:00:00 | NDC 00641604401

## 2013-06-13 MED ADMIN — ceftriaxone (ROCEPHIN) 1 g in 0.9% sodium chloride (MBP/ADV) 50 mL MBP: INTRAVENOUS | @ 11:00:00 | NDC 00409733201

## 2013-06-13 MED ADMIN — sodium chloride 0.9 % bolus infusion 1,000 mL: INTRAVENOUS | @ 10:00:00 | NDC 00409798309

## 2013-06-13 MED ADMIN — enoxaparin (LOVENOX) injection 40 mg: SUBCUTANEOUS | @ 17:00:00 | NDC 00075801401

## 2013-06-13 MED ADMIN — labetalol (NORMODYNE;TRANDATE) injection 10 mg: INTRAVENOUS | @ 12:00:00 | NDC 00409226720

## 2013-06-13 MED ADMIN — insulin lispro (HUMALOG) injection: SUBCUTANEOUS | @ 17:00:00 | NDC 99999192301

## 2013-06-13 MED ADMIN — sodium chloride (NS) flush 5-10 mL: INTRAVENOUS | @ 18:00:00 | NDC 87701099893

## 2013-06-13 MED ADMIN — lorazepam (ATIVAN) injection 1 mg: INTRAVENOUS | @ 10:00:00 | NDC 00641604401

## 2013-06-13 NOTE — ED Notes (Signed)
Pt remains very restless on stretcher. MD made aware.

## 2013-06-13 NOTE — ED Notes (Signed)
MRI checklist completed by daughter, sent upstairs with pt.

## 2013-06-13 NOTE — H&P (Signed)
[facility]  [address] [place], [place]  [address]  [phone]    Admission History and Physical      NAME:              Chelsea Schultz   DOB:   19-Sep-1926   MRN:  [id number]     PCP:  Angeline Slim, MD     Date/Time:  06/13/2013 8:14 AM     Subjective:     CHIEF COMPLAINT:  Altered mental status    HISTORY OF PRESENT ILLNESS:     [name] is a 79 y.o.  African American female who is being admitted for a sudden onset of altered mental status.  [name] was last seen well at around 10 PM on 06/12/13 by her daughter before she retired to bed. She had been fully well and functional other than a recent UTI for which she had been on macrobid but had a change of antibiotics yesterday prior to this event. [facility] apparently grew a resistant bug. She was give ativan in the ED due to agitation and at the time of review, she could not give much hx and I have discussed with her daughter for collaborative Hx. An initial head CT scan was normal.    No Known Allergies    Prior to Admission medications    Medication Sig Start Date End Date Taking? Authorizing Provider   metformin (GLUCOPHAGE) 500 mg tablet Take 1,000 mg by mouth daily (with breakfast).   Yes Phys Other, MD   metformin (GLUCOPHAGE) 500 mg tablet Take 500 mg by mouth Daily (before dinner).   Yes Phys Other, MD   amlodipine (NORVASC) 5 mg tablet Take 5 mg by mouth nightly.   Yes Phys Other, MD   oxybutynin (DITROPAN) 5 mg tablet Take 5 mg by mouth nightly.   Yes Phys Other, MD   Aspirin, Buffered 81 mg tab Take 81 mg by mouth daily.   Yes Phys Other, MD   pregabalin (LYRICA) 100 mg capsule Take 1 Cap by mouth two (2) times a day. 06/09/13  Yes Deliah Goody, MD   DIOVAN 320 mg tablet TAKE 1 TABLET BY MOUTH DAILY 09/30/12  Yes Angeline Slim, MD   omeprazole (PRILOSEC) 20 mg capsule Take 1  Cap by mouth daily. 09/09/12  Yes Angeline Slim, MD   mv-mn-FA-D3-lycopene-lut-coQ10 (DAILY MULTIVITAMIN) 200-2,000-500 mcg-unit-mcg cap Take 200 mcg by mouth daily. 07/08/12  Yes Angeline Slim, MD   docusate sodium (DOC-Q-LACE) 100 mg capsule Take 1 Cap by mouth daily. 06/10/12  Yes Iona Coach, MD   ascorbic acid (VITAMIN C) 250 mg tablet Take 500 mg by mouth two (2) times a day.   Yes Historical Provider   calcium-cholecalciferol, D3, (CALCIUM 600 + D) tablet Take 2 Tabs by mouth daily. 02/28/10  Yes Angeline Slim, MD   estradiol (ESTRACE) 0.01 % (0.1 mg/gram) vaginal cream Apply pea sized amount to vagina and vulva with fingertip 3 times per week. Throw away the applicator.  Indications: ATROPHIC VAGINITIS ASSOCIATED WITH MENOPAUSE 06/10/12   C Toma Deiters, MD   Psyllium Husk-Sucrose (METAMUCIL) 3.4 gram/7 gram Powd Take 1 Cap by mouth daily. 02/28/10   Angeline Slim, MD       Past Medical History   Diagnosis Date   ??? Diabetes    ??? Hypertension    ??? Neuropathy    ???  Spinal stenosis    ??? CVA (cerebral infarction)    ??? GERD (gastroesophageal reflux disease)    ??? Nausea & vomiting    ??? Arthritis    ??? Osteopetrosis         Past Surgical History   Procedure Laterality Date   ??? Hx orthopaedic       Lumbar laminectomy   ??? Hx orthopaedic       Lumbar fusion       Social History   Substance Use Topics   ??? Smoking status: Never Smoker    ??? Smokeless tobacco: Never Used   ??? Alcohol Use: No        Family History   Problem Relation Age of Onset   ??? Diabetes Mother    ??? Stroke Mother    ??? Hypertension Mother    ??? Hypertension Father    ??? Stroke Father    ??? Cancer Sister    ??? Stroke Sister         Review of Systems:    As per daughter    Constitutional ROS: no fever, chills, rigors or night sweats  Respiratory ROS: no cough, sputum, hemoptysis, dyspnea or pleuritic pain.  Cardiovascular ROS: no chest pain, palpitations, orthopnea, PND or syncope  Endocrine ROS: no  polydispsia, polyuria, heat or cold intolerance or major weight change.  Gastrointestinal ROS: no dysphagia, odynophagia, abdominal pain, nausea, vomiting, diarrhea or any bleeding   Genito-Urinary ROS: no dysuria, frequency, hematuria, retention or flank pain  Musculoskeletal ROS: no joint pain, swelling or muscular tenderness  Neurological ROS: no headache, confusion, focal weakness or any other neurological symptoms  Psychiatric ROS: no depression, anxiety, mood swings  Dermatological ROS: no rash, pruritis, or urticaria      Objective:      Vital signs reviewed most recent are:    Visit Vitals   Item Reading   ??? BP 182/95   ??? Pulse 111   ??? Temp 96.5 ??F (35.8 ??C)   ??? Resp 22   ??? Ht 4\' 7"  (1.397 m)   ??? Wt 52.164 kg (115 lb)   ??? BMI 26.73 kg/m2   ??? SpO2 100%     SpO2 Readings from Last 6 Encounters:   06/13/13 100%   04/03/12 97%   07/01/10 98%        No intake or output data in the 24 hours ending 06/13/13 0814     Exam:     Physical Exam:    Gen:  Weak and ill looking elderly female patient, somewhat restless but sedated   Eyes: Pink conjunctivae, pupils are symmetrical and sluggish to reaction to light  ENT:  No ottorrhea or rhinorrhea, dry mucous membranes  Neck:  Supple, no masses or thyromegaly, trachea is central   Pulm:  No accessory muscle use, clear breath sounds without crackles or wheezes  Card:  No JVD or murmurs, has sinus tachycardia, without thrills, bruits or peripheral edema  Abd:  Soft, non-distended, normoactive bowel sounds are present, no palpable organomegaly.   Lymph:  No cervical or axillary adenopathy  Musc:  No cyanosis, clubbing, atrophy or deformities.  Skin:  No rashes, bruising or ulcers   Neuro: ?left sided facial droop, sedated but she seems to move her right sided extremities spontaneously. Less movement left side   Psych:  no hallucinations or delusions  ________________________________________________________________________    Labs:    Recent Labs      06/13/13   0530   WBC  14.8*   HGB  12.5   HCT  38.8   PLT  276     Recent Labs      06/13/13   0530   NA  138   K  3.4*   CL  99   CO2  27   GLU  194*   BUN  11   CREA  0.65   CA  10.1   ALB  4.0   SGOT  23   ALT  20     No components found with this basename: glpoc     No results found for this basename: PH, PCO2, PO2, HCO3, FIO2,  in the last 72 hours  No results found for this basename: INR,  in the last 72 hours    Diagnostics reviewed:     Chest X-ray: Normal.  Initial EKG - sinus tachycardia  CT scan head - normal    Assessment:       Ms. Bouton is a 79 y.o.  African American female being admitted for:     Principal Problem:    Metabolic encephalopathy (06/13/2013)    Active Problems:    Back pain with radiation (10/25/2009)      Vulvar atrophy (06/10/2012)      Overview: E2 started 3x/week 05/2012      HTN (hypertension), benign (06/13/2013)      DM type 2 (diabetes mellitus, type 2) (06/13/2013)      SIRS (systemic inflammatory response syndrome) (06/13/2013)         Plan:      Metabolic encephalopathy (06/13/2013): Am suspicious of of an acute CVA although sepsis can account for her presentation or seizures. Admit to telemetry. Keep NPO for now. Hydrate. [facility] to tailor antibiotic choice. Obtain MRI and MRA brain. Consult neurology and closely monitor her neurological status.     SIRS (systemic inflammatory response syndrome) (06/13/2013): CXR and UA are unremarkable. Has leukocytosis, tachycardia with tachypnea. Will Rx for her known recent UTI for now. Obtain further work up as above     Back pain with radiation (10/25/2009): she has had prior surgeries for this and it seems well controlled. Pain control when more alert. PT/OT once she is able to particiapate    Vulvar atrophy (06/10/2012)? Recent UTI: obtain urine cultures, resume localized estrogen cream    HTN (hypertension), benign (06/13/2013): Allow permissive HTN for now and would monitor on no medications for now pending MRI studies.    DM  type 2 (diabetes mellitus, type 2) (06/13/2013): no obvious known complications. Keep NPO for now. Monitor blood glucose with SSI per protocol. Obtain an A1c       NB: she still sees Dr Linward Headland but am told she is no longer with [facility]    Code Status:  Full    Surrogate decision maker: Family    Risk of deterioration: high      Total time spent for the care of the patient: 50 Minutes Critical Care                 Care Plan discussed with: Patient, Family, Nursing Staff and ED physician    Discussed:  Code Status, Care Plan and D/C Planning    Prophylaxis:  Lovenox    Probable Disposition:  TBD           ___________________________________________________    Attending Physician: Melynda Keller, MD

## 2013-06-13 NOTE — Progress Notes (Signed)
06/13/13  1:49 PM  Case manager noted consult for discharge planning; this was completed already by D. Val Eagle in the ED.  Armond Hang RN, BSN

## 2013-06-13 NOTE — Progress Notes (Addendum)
1800: Call made to [facility] on [employer] as fax to obtain urine results from medical records was sent twice with no response. Representative stated they had not checked the fax machine. Dr. Lorette Ang requested family member to visit facility this evening to obtain medical record in person. Release form given to patient's daughter who stated she would visit the facility.    1913: Bedside and Verbal shift change report given to Hansel Starling, Charity fundraiser (Cabin crew) by Toniann Fail, RN (offgoing nurse). Report included the following information SBAR, Kardex, Intake/Output and MAR.

## 2013-06-13 NOTE — ED Notes (Signed)
Spoke with family practice. Dr Silvio Clayman no longer with them. Spoke with dr Lorette Ang who will admit

## 2013-06-13 NOTE — Progress Notes (Signed)
06/13/2013   CARE MANAGEMENT NOTE:  CM is following pt in the ER for initial d/c planning.  EMR reviewed.  CM met with pt's dtr Lucius Conn [phone]) in a two story home with bedroom on the second floor although pt has been sleeping downstairs.  PTA, pt was ambulatory with a rolling walker.  Pt also self propels a manual w/c and has been indepn with ADLs.  She relies on others for transportation needs.  Per dtr's report pt had surgery at [facility] in April followed by inpt acute rehab at [facility].  Pt then d/c'd to [facility] for short term skilled rehab and subsequent home healthcare thru [facility].  Pt was d/c'd from home health and began outpt rehab at [facility] at the [facility] location.  DME in the home includes a rolling walker, w/c, hospital bed, BSC, and shower bench.  PCP is Dr. Linward Headland.  D.Wegner

## 2013-06-13 NOTE — Progress Notes (Signed)
Stroke Education provided to patient and relative(s) and the following topics were discussed    1. Patients personal risk factors for stroke are hypertension and diabetes mellitus    2. Warning signs of Stroke:        * Sudden numbness or weakness of the face, arm or leg, especially on one side of          The body            * Sudden confusion, trouble speaking or understanding        * Sudden trouble seeing in one or both eyes        * Sudden trouble walking, dizziness, loss of balance or coordination        * Sudden severe headache with no known cause      3. Importance of activation Emergency Medical Services ( 9-1-1 ) immediately if experience any warning signs of stroke.    4. Be sure and schedule a follow-up appointment with your primary care doctor or any specialists as instructed.     5. You must take medicine every day to treat your risk factors for stroke.  Be sure to take your medicines exactly as your doctor tells you: no more, no less.  Know what your medicines are for , what they do.  Anti-thrombotics /anticoagulants can help prevent strokes.  You are taking the following medicine(s) zocor,plavix    6.  Smoking and second-hand smoke greatly increase your risk of stroke, cardiovascular disease and death. Smoking history never    7. Information provided was [facility] Stroke Education Binder, Stroke Handouts or Verbal Education    8. Documentation of teaching completed in Patient Education Activity and on Care Plan with teaching response noted?  yes

## 2013-06-13 NOTE — ED Notes (Signed)
Pt cleaned and adult diaper changed, saturated with urine.

## 2013-06-13 NOTE — ED Notes (Signed)
Pt transported to 321 via stretcher in nad, accompanied by transport nurse [employer], Charity fundraiser.

## 2013-06-13 NOTE — ED Notes (Signed)
Pt adult diaper changed, linen and gown changed. Saturated with urine. No particular odor noted.

## 2013-06-13 NOTE — Progress Notes (Signed)
Obtained [facility] record [facility] via family member; placed in chart.

## 2013-06-13 NOTE — Consults (Signed)
[facility]   Inpatient Neurology Consult    Oris Drone, MD    [address] [place], [address]  [place], [place]  [address]   [place], [place]  [address]  [phone]    [phone]    Admission Date:  06/13/2013  Consult Date:   06/13/2013  Referring physician:  Melynda Keller, MD  Reason for Consult/ Cc:  Altered mental status  Source of Hx:   Chart, Family    HISTORY OF PRESENT ILLNESS:     Chelsea Schultz is a 79 y.o. female with hx DM, HTN, severe thoracic spinal stenosis (T11-T12), chronic urinary incontinence admitted for evaluation of altered mental status.  At baseline, family denies that she has any significant cognitive difficulties and she's ambulatory with a rolling walker.  Patient is said to have a UTI for the past 1 week, on Abx, but repeat testing showed the bacteria was resistant so another Abx was prescribed recently.  She was apparently in normal mental state as of going to sleep last PM.  Daughter awoke around 4 AM, found her lying on the floor moaning, confused, eyes open, so she was brought in for evaluation.  No witnessed seizure and no hx of seizure.  Patient was pre-medicated with Ativan before her MRI earlier today (2-3 PM) and has been sedate ever since, only arousing briefly to sternal rub, no verbalization.    Taking ASA 81 mg/ day prior to admission     MRI Brain images reviewed: Tiny right frontal periventricular ischemic stroke, severe subcortical small vessel ischemic disease, moderate generalized atrophy  MRA Brain reviewed: normal MRA head  TTE: not done yet  Carotid doppler: not done yet     Repeat UA: no sign of UTI  CBC with WBCs 14k (elevated)      Fasting Lipid Panel: Lab Results   Component Value Date/Time    Cholesterol, Total 176 12/12/2011  8:51 AM    Cholesterol, total 189 02/28/2010  9:42 AM    HDL Cholesterol 56 02/28/2010  9:42 AM    LDL, calculated 120 02/28/2010  9:42 AM    VLDL, calculated 13 02/28/2010  9:42 AM     Triglyceride 66 02/28/2010  9:42 AM     Review of Systems: Please refer to the admitting H&P and/or ER note and/or most recent admitting physician team progress note for a review of systems.    Allergies:   No Known Allergies    Outpatient Meds  No current facility-administered medications on file prior to encounter.     Current Outpatient Prescriptions on File Prior to Encounter   Medication Sig Dispense Refill   ??? DIOVAN 320 mg tablet TAKE 1 TABLET BY MOUTH DAILY  30 Tab  0   ??? omeprazole (PRILOSEC) 20 mg capsule Take 1 Cap by mouth daily.  90 Cap  3   ??? docusate sodium (DOC-Q-LACE) 100 mg capsule Take 1 Cap by mouth daily.  30 Cap  5   ??? ascorbic acid (VITAMIN C) 250 mg tablet Take 500 mg by mouth two (2) times a day.       ??? calcium-cholecalciferol, D3, (CALCIUM 600 + D) tablet Take 2 Tabs by mouth daily.  60 Tab  12       Inpatient Meds  Current Facility-Administered Medications   Medication Dose Route Frequency   ??? [EXPIRED] sodium chloride 0.9 % bolus infusion 1,000 mL  1,000 mL IntraVENous CONTINUOUS   ??? [COMPLETED] lorazepam (  ATIVAN) injection 1 mg  1 mg IntraVENous ONCE   ??? [COMPLETED] ceftriaxone (ROCEPHIN) 1 g in 0.9% sodium chloride (MBP/ADV) 50 mL MBP  1 g IntraVENous NOW   ??? [COMPLETED] labetalol (NORMODYNE;TRANDATE) injection 10 mg  10 mg IntraVENous NOW   ??? sodium chloride (NS) flush 5-10 mL  5-10 mL IntraVENous Q8H   ??? 0.9% sodium chloride infusion  100 mL/hr IntraVENous CONTINUOUS   ??? enoxaparin (LOVENOX) injection 40 mg  40 mg SubCUTAneous Q24H   ??? insulin lispro (HUMALOG) injection   SubCUTAneous Q6H   ??? [COMPLETED] lorazepam (ATIVAN) injection 0.5 mg  0.5 mg IntraVENous NOW   ??? [START ON 06/14/2013] ceftriaxone (ROCEPHIN) 1 g in 0.9% sodium chloride (MBP/ADV) 50 mL MBP  1 g IntraVENous Q24H   ??? [START ON 06/14/2013] clopidogrel (PLAVIX) tablet 75 mg  75 mg Oral DAILY   ??? simvastatin (ZOCOR) tablet 20 mg  20 mg Oral QHS       PMHx  Past Medical History   Diagnosis Date   ??? Diabetes    ??? Hypertension     ??? Neuropathy    ??? Spinal stenosis    ??? CVA (cerebral infarction)    ??? GERD (gastroesophageal reflux disease)    ??? Nausea & vomiting    ??? Arthritis    ??? Osteopetrosis        PSHx  Past Surgical History   Procedure Laterality Date   ??? Hx orthopaedic       Lumbar laminectomy   ??? Hx orthopaedic       Lumbar fusion       FAMHx  Family History   Problem Relation Age of Onset   ??? Diabetes Mother    ??? Stroke Mother    ??? Hypertension Mother    ??? Hypertension Father    ??? Stroke Father    ??? Cancer Sister    ??? Stroke Sister        SocHx  History     Social History   ??? Marital Status: WIDOWED     Spouse Name: N/A     Number of Children: N/A   ??? Years of Education: N/A     Occupational History   ??? Not on file.     Social History Main Topics   ??? Smoking status: Never Smoker    ??? Smokeless tobacco: Never Used   ??? Alcohol Use: No   ??? Drug Use: No   ??? Sexually Active: No     Other Topics Concern   ??? Not on file     Social History Narrative   ??? No narrative on file       PHYSICAL EXAM  Patient Vitals for the past 12 hrs:   Temp Pulse Resp BP SpO2   06/13/13 1728 98.7 ??F (37.1 ??C) 110 18 159/98 mmHg 100 %   06/13/13 1335 97.4 ??F (36.3 ??C) 91 18 183/79 mmHg 98 %   06/13/13 1237 97.3 ??F (36.3 ??C) 100 18 176/88 mmHg 98 %   06/13/13 1200 - 101 18 - 99 %   06/13/13 1130 - 94 21 - 98 %   06/13/13 1045 - 102 19 182/97 mmHg 98 %   06/13/13 1030 - 97 20 174/89 mmHg 98 %   06/13/13 1000 - 98 21 195/97 mmHg 100 %   06/13/13 0945 - 88 18 183/91 mmHg 98 %   06/13/13 0930 - 94 20 164/96 mmHg 98 %   06/13/13 0915 - 91 18  180/89 mmHg 99 %   06/13/13 0900 - 95 23 182/89 mmHg 99 %   06/13/13 0845 - 95 22 184/103 mmHg 100 %   06/13/13 0830 - 94 24 171/100 mmHg 100 %   06/13/13 0815 - 88 21 174/88 mmHg 100 %   06/13/13 0803 - 111 - 182/95 mmHg -   06/13/13 0800 - 109 22 182/95 mmHg 100 %   06/13/13 0745 - 110 19 169/88 mmHg 99 %       General: Older female, resting in bed, sedate/ lethargic; only arouses to sternal rub, nailbed pressure (recieved  Ativan earlier today)  Eyes: see neuro exam  Head/ Ears/ Mouth: see neuro exam   Neck: supple  CV: regular rhythm, regular rate, no carotid bruits  Lungs: clear bilaterally  Musculoskeletal: see neuro exam for strength  Extremities/ Skin: warm, no edema  Psychiatric: see neuro exam for mental status    Neurological Exam     Mental status: Obtunded     Language: Non-verbal    CNs: [CN 1 smell, CN 9 sensory component not tested]    Pupils (CN 2): equal, round, reactive to light bilaterally  Fundoscopic: not examined  Visual fields: cannot assess d/ t altered mental status  EOMs (CN 3, 4, 6): roving eye movements from center to right side; doesn't cross midline spontaneously  Facial sensation (CN 5): cannot assess d/t altered mental status   Facial strength (CN 7): appears symmetric    Hearing (CN 8):  cannot assess d/ t altered mental status  Palate/ Gag reflex (CN 10, 9):  cannot assess d/ t altered mental status  Shoulder shrug (CN 11):  cannot assess d/ t altered mental status  Tongue (CN 12): cannot assess d/ t altered mental status    Sensory:   Localizes to pinprick in right upper/ lower extremity    Motor:   Some spontaneous movements of right upper / lower extremity: 4/ 5  Minimal to no spontaneous movement of left upper/ lower extermity    Reflexes:    Left Upper ext: 3/ 5  Left Lower ext 3/ 5  Right Upper ext  2-3/ 5  Right Lower ext 2-3/ 5    Plantar response: up-going on left; equivocal on right (withdraws leg)    Coordination: cannot assess d/ t altered mental status  Gait: due to the patient's condition, gait was not tested   Romberg: not tested as gait was not tested  -----------------------------  Recent Results (from the past 12 hour(s))   GLUCOSE, POC    Collection Time     06/13/13 12:57 PM       Result Value Range    Glucose (POC) 189 (*) 65 - 105 mg/dL    Performed by Maple Hudson     GLUCOSE, POC    Collection Time     06/13/13  4:48 PM       Result Value Range    Glucose (POC) 153 (*) 65 - 105  mg/dL    Performed by Jackson Latino         Labs and imaging studies reviewed    Hospital Problems Date Reviewed: 01/10/2013        ICD-9-CM Class Noted POA    *Metabolic encephalopathy 348.31  06/13/2013 Yes        HTN (hypertension), benign 401.1  06/13/2013 Yes        DM type 2 (diabetes mellitus, type 2) 250.00  06/13/2013 Yes  SIRS (systemic inflammatory response syndrome) 995.90  06/13/2013 Yes        Vulvar atrophy (Chronic) 624.1  06/10/2012 Yes    Overview    Signed 06/10/2012 12:26 PM by Cyndia Diver, MD      E2 started 3x/week 05/2012          Back pain with radiation 724.5  10/25/2009 Yes              Assessment and Recommendations    Acute, small/ tiny right frontal periventricular ischemic stroke  Altered mental status  Hx of UTI but recent testing didn't show ongoing infection    Check TTE, Carotid Dopplers  Add simvastatin 20 mg QHS for stroke risk reduction  Since stroke while taking ASA, will d/c ASA and start Plavix 75 mg/ day  Permissive HTN x 24 hours then gradually get BP back to normal ranges  Current AMS may be d/t ativan, but will check EEG to exclude subclinical seizure  Ordered PT/ OT/ ST consults re: stroke  Continue DVT prophylaxis with Lovenox    Will f/u tomorrow    Thank you for allowing me to participate in your patient's care.  Sincerely,    Oris Drone, MD

## 2013-06-13 NOTE — Progress Notes (Signed)
[facility]: MED RECONCILIATION    Information obtained from: Daughter/ [facility]    Comments/Recommendations:   ?? Januvia 100 mg daily added profile **Recommend dose decrease to 50 mg daily for renal function**  ?? Lasix 40 mg as needed added     Significant PMH/Disease States:   Past Medical History   Diagnosis Date   ??? Diabetes    ??? Hypertension    ??? Neuropathy    ??? Spinal stenosis    ??? CVA (cerebral infarction)    ??? GERD (gastroesophageal reflux disease)    ??? Nausea & vomiting    ??? Arthritis    ??? Osteopetrosis        Chief Complaint for this Admission:   Chief Complaint   Patient presents with   ??? Altered mental status       Allergies: Review of patient's allergies indicates no known allergies.    Prior to Admission Medications:     Prior to Admission Medications   Outpatient Medications Last Dose Informant Patient Reported? Taking?   Aspirin, Buffered 81 mg tab 06/12/2013 at am  Yes Yes   Sig: Take 81 mg by mouth daily.   DIOVAN 320 mg tablet 06/12/2013 at Unknown time  No Yes   Sig: TAKE 1 TABLET BY MOUTH DAILY   amlodipine (NORVASC) 5 mg tablet 06/12/2013 at pm  Yes Yes   Sig: Take 5 mg by mouth nightly.   ascorbic acid (VITAMIN C) 250 mg tablet 06/12/2013 at pm  Yes Yes   Sig: Take 500 mg by mouth two (2) times a day.   calcium-cholecalciferol, D3, (CALCIUM 600 + D) tablet 06/12/2013 at am  No Yes   Sig: Take 2 Tabs by mouth daily.   docusate sodium (DOC-Q-LACE) 100 mg capsule 06/12/2013 at Unknown time  No Yes   Sig: Take 1 Cap by mouth daily.   furosemide (LASIX) 40 mg tablet   Yes Yes   Sig: Take 40 mg by mouth as needed.   hydrocodone-acetaminophen (NORCO) 5-325 mg per tablet   Yes Yes   Sig: Take 1 tablet by mouth every six (6) hours as needed for Pain.   metformin (GLUCOPHAGE) 500 mg tablet 06/12/2013 at Unknown time  Yes Yes   Sig: Take 1,000 mg by mouth daily (with breakfast).   multivitamin with iron (DAILY MULTI-VITAMINS/IRON) tablet 06/12/2013 at Unknown time Child Yes Yes   Sig: Take 1 tablet by mouth  daily.   omeprazole (PRILOSEC) 20 mg capsule 06/12/2013 at Unknown time  No Yes   Sig: Take 1 Cap by mouth daily.   oxybutynin (DITROPAN) 5 mg tablet 06/12/2013 at Unknown time  Yes Yes   Sig: Take 5 mg by mouth nightly.   pregabalin (LYRICA) 100 mg capsule 06/12/2013 at Unknown time Child Yes Yes   Sig: Take 100 mg by mouth daily.   sitagliptin (JANUVIA) 100 mg tablet 06/12/2013 at Unknown time  Yes Yes   Sig: Take 100 mg by mouth daily.      Facility-Administered Medications: None       Signed: CANDICE Linward Foster, PHARMD

## 2013-06-13 NOTE — Progress Notes (Signed)
NUTRITION      ASSESSMENT:   RD referral received via braden screen for decreased po intake. Pt admitted w/ AMS. She is currently NPO and soundly sleeping at time of assessment. Pt's family at bedside states that her appetite has been good at home. Some mild wt loss is possibly reported, but unknown. She is currently NPO until she is alert enough for po. No nutrition risk identified at this time. Will monitor for diet advancement and po intakes and will provide further nutrition intervention and assessment as appropriate.     Diet: NPO   No data found.    Skin: no pressure ulcers  Labs and Medications: Reviewed  [x]        [x]   No nutrition education needs identified   [x]   No cultural, religious, or ethnic dietary needs identified    [x]   Participated in care plan, discharge planning, and/or Interdisciplinary rounds      Anthropometrics:  ,  , BMI (calculated): 26.8  Wt Readings from Last 1 Encounters:   06/13/13 52.164 kg (115 lb)      Ht Readings from Last 1 Encounters:   06/13/13 4\' 7"  (1.397 m)    Body mass index is 26.73 kg/(m^2).    Estimated Nutrition Needs: 1049 Kcals/day (msje x 1.3), Protein (g): 52 g (1 g/kg) Fluid (ml):  (1 ml/kcal)  Based on:   [x]              Actual BW    []              IBW   []                Adjusted BW      NUTRITION DIAGNOSIS & Intervention:   No Nutrition Diagnosis Identified  No Nutrition Interventions needed    No Nutrition Risk Identified:  [x]     MONITORING & EVALUATION:   RD will f/u as appropriate for further nutrition intervention and assessment as warranted or by IP Nutrition Consult    Curt Jews. Orlene Erm, RD

## 2013-06-13 NOTE — ED Notes (Signed)
Pt living with daughter. Uses wheelchair. Daughter states she noted that pt not acting right when she woke up a little after 0400. Daughter found pt on floor, not responding appropriately. Eyes open. Recent UTI since Saturday. abx changed last night, given initial dose.

## 2013-06-13 NOTE — ED Notes (Signed)
Daughter informed report had been called and as soon as room is finished being cleaned pt will be moved upstairs.

## 2013-06-13 NOTE — ED Provider Notes (Addendum)
Patient is a 79 y.o. female presenting with altered mental status. The history is provided by a relative and the EMS personnel.   Altered mental status   This is a new (patient was found by daughter by her bed on the floor and acutely confused.  No noted head trauma. Recent treatment by PCP for UTI past week. Stroger AB giveen yesterday) problem. The current episode started 1 to 2 hours ago. The problem has not changed since onset.Associated symptoms include confusion and unresponsiveness. Pertinent negatives include no seizures, no agitation, no hallucinations, no self-injury, no violence and no numbness. Mental status baseline is normal.  Risk factors include a recent illness. Her past medical history is significant for diabetes, CVA and hypertension. Her past medical history does not include seizures, liver disease, COPD, depression, dementia, psychotropic medication treatment, head trauma or heart disease. Past medical history comments: had CVA last year and only has residual left leg weakness this was status post back surgery at [facility].        Past Medical History   Diagnosis Date   ??? Diabetes    ??? Hypertension    ??? Neuropathy    ??? Spinal stenosis    ??? CVA (cerebral infarction)    ??? GERD (gastroesophageal reflux disease)    ??? Nausea & vomiting    ??? Arthritis    ??? Osteopetrosis         Past Surgical History   Procedure Laterality Date   ??? Hx orthopaedic       Lumbar laminectomy   ??? Hx orthopaedic       Lumbar fusion         Family History   Problem Relation Age of Onset   ??? Diabetes Mother    ??? Stroke Mother    ??? Hypertension Mother    ??? Hypertension Father    ??? Stroke Father    ??? Cancer Sister    ??? Stroke Sister         History     Social History   ??? Marital Status: WIDOWED     Spouse Name: N/A     Number of Children: N/A   ??? Years of Education: N/A     Occupational History   ??? Not on file.     Social History Main Topics   ??? Smoking status: Never Smoker    ??? Smokeless tobacco: Never Used   ??? Alcohol Use: No   ???  Drug Use: No   ??? Sexually Active: No     Other Topics Concern   ??? Not on file     Social History Narrative   ??? No narrative on file                  ALLERGIES: Review of patient's allergies indicates no known allergies.      Review of Systems   Constitutional: Negative for fever, appetite change and unexpected weight change.   HENT: Negative for facial swelling, rhinorrhea, trouble swallowing, neck pain, dental problem and ear discharge.    Eyes: Negative for pain and discharge.   Respiratory: Negative for apnea and stridor.    Cardiovascular: Negative for chest pain, palpitations and leg swelling.   Gastrointestinal: Negative for vomiting, abdominal pain, diarrhea, blood in stool and abdominal distention.   Genitourinary: Positive for dysuria and urgency. Negative for hematuria, flank pain and difficulty urinating.   Musculoskeletal: Positive for back pain. Negative for myalgias, joint swelling and arthralgias.   Skin: Negative  for color change, rash and wound.   Neurological: Negative for seizures, facial asymmetry, speech difficulty, numbness and headaches.   Hematological: Negative for adenopathy.   Psychiatric/Behavioral: Positive for confusion and altered mental status. Negative for suicidal ideas, hallucinations, behavioral problems, self-injury and agitation.       Filed Vitals:    06/13/13 0506   BP: 172/80   Pulse: 80   Temp: 96.5 ??F (35.8 ??C)   Resp: 16   Height: 4\' 7"  (1.397 m)   Weight: 52.164 kg (115 lb)   SpO2: 98%            Physical Exam   Nursing note and vitals reviewed.  Constitutional: She appears well-developed and well-nourished. She appears distressed.   Appears stated age.  Appears uncomfortable. Moving about on cart   HENT:   Head: Normocephalic and atraumatic.   Right Ear: External ear normal.   Left Ear: External ear normal.   Mouth/Throat: No oropharyngeal exudate.   Dry MM   Eyes: Conjunctivae and EOM are normal. Pupils are equal, round, and reactive to light. Right eye exhibits no  discharge. Left eye exhibits no discharge. No scleral icterus.   To wandering gaze looking about room   Neck: Normal range of motion. No tracheal deviation present. No thyromegaly present.   Cardiovascular: Normal rate, regular rhythm and normal heart sounds.  Exam reveals no friction rub.    No murmur heard.  Pulmonary/Chest: Effort normal and breath sounds normal. No respiratory distress. She has no wheezes. She has no rales. She exhibits no tenderness.   Abdominal: Soft. Bowel sounds are normal. She exhibits no distension. There is no tenderness. There is no rebound and no guarding.   Musculoskeletal: Normal range of motion. She exhibits no edema and no tenderness.   Lymphadenopathy:     She has no cervical adenopathy.   Neurological: She has normal reflexes. No cranial nerve deficit. Coordination normal.   No gross lateralizing deficits.  GCS=11   Skin: Skin is warm. No erythema.   Psychiatric:   Unable to assess        MDM     Amount and/or Complexity of Data Reviewed:   Clinical lab tests:  Ordered and reviewed  Tests in the radiology section of CPT??:  Ordered and reviewed  Tests in the medicine section of the CPT??:  Ordered and reviewed   Obtain history from someone other than the patient:  Yes   Review and summarize past medical records:  Yes   Discuss the patient with another provider:  Yes   Independant visualization of image, tracing, or specimen:  Yes  Risk of Significant Complications, Morbidity, and/or Mortality:   Presenting problems:  Moderate  Diagnostic procedures:  Moderate  Management options:  Moderate  General Comments: Pending urine.  Case passed onto Dr. Maricela Curet for final disposition.        Procedures  ED EKG interpretation:  Rhythm: sinus tachycardia; and regular . Rate (approx.): 118; Axis: normal; P wave: normal; QRS interval: normal ; ST/T wave: non-specific changes; in  Lead: ; Other findings: abnormal ekg. This EKG was interpreted by Sunnie Nielsen. Mort Sawyers, DO,ED Provider.

## 2013-06-13 NOTE — Consults (Signed)
See H/P

## 2013-06-13 NOTE — ED Notes (Signed)
TRANSFER - OUT REPORT:    Verbal report given to Wendy RN(name) on Dellis Filbert  being transferred to 321(unit) for routine progression of care       Report consisted of patient???s Situation, Background, Assessment and   Recommendations(SBAR).     Information from the following report(s) SBAR, ED Summary, MAR, Recent Results and Cardiac Rhythm NSR was reviewed with the receiving nurse.    Opportunity for questions and clarification was provided.

## 2013-06-14 DIAGNOSIS — I6330 Cerebral infarction due to thrombosis of unspecified cerebral artery: Secondary | ICD-10-CM

## 2013-06-14 LAB — METABOLIC PANEL, COMPREHENSIVE
A-G Ratio: 0.8 — ABNORMAL LOW (ref 1.1–2.2)
ALT (SGPT): 20 U/L (ref 12–78)
AST (SGOT): 33 U/L (ref 15–37)
Albumin: 3.1 g/dL — ABNORMAL LOW (ref 3.5–5.0)
Alk. phosphatase: 73 U/L (ref 45–117)
Anion gap: 11 mmol/L (ref 5–15)
BUN/Creatinine ratio: 17 (ref 12–20)
BUN: 10 MG/DL (ref 6–20)
Bilirubin, total: 0.4 MG/DL (ref 0.2–1.0)
CO2: 22 mmol/L (ref 21–32)
Calcium: 9.4 MG/DL (ref 8.5–10.1)
Chloride: 97 mmol/L (ref 97–108)
Creatinine: 0.59 MG/DL (ref 0.45–1.15)
GFR est AA: >60 mL/min/{1.73_m2} (ref >60)
GFR est non-AA: >60 mL/min/{1.73_m2} (ref >60)
Globulin: 3.9 g/dL (ref 2.0–4.0)
Glucose: 168 mg/dL — ABNORMAL HIGH (ref 65–100)
Potassium: 3.8 mmol/L (ref 3.5–5.1)
Protein, total: 7 g/dL (ref 6.4–8.2)
Sodium: 130 mmol/L — ABNORMAL LOW (ref 136–145)

## 2013-06-14 LAB — CBC W/O DIFF
HCT: 37.9 % (ref 35.0–47.0)
HGB: 12.3 g/dL (ref 11.5–16.0)
MCH: 26 PG (ref 26.0–34.0)
MCHC: 32.5 g/dL (ref 30.0–36.5)
MCV: 80.1 FL (ref 80.0–99.0)
PLATELET: 288 10*3/uL (ref 150–400)
RBC: 4.73 M/uL (ref 3.80–5.20)
RDW: 14.4 % (ref 11.5–14.5)
WBC: 17.9 10*3/uL — ABNORMAL HIGH (ref 3.6–11.0)

## 2013-06-14 LAB — GLUCOSE, POC
Glucose (POC): 138 mg/dL — ABNORMAL HIGH (ref 65–105)
Glucose (POC): 148 mg/dL — ABNORMAL HIGH (ref 65–105)
Glucose (POC): 151 mg/dL — ABNORMAL HIGH (ref 65–105)
Glucose (POC): 162 mg/dL — ABNORMAL HIGH (ref 65–105)

## 2013-06-14 LAB — GLUCOSE, CSF
Glucose,CSF: 101 MG/DL — ABNORMAL HIGH (ref 40–70)
Tube No.: 2

## 2013-06-14 LAB — PROTEIN, CSF
Protein,CSF: 51 MG/DL — ABNORMAL HIGH (ref 15–45)
Tube No.: 2

## 2013-06-14 MED ADMIN — sodium chloride (NS) flush 5-10 mL: INTRAVENOUS | @ 18:00:00 | NDC 87701099893

## 2013-06-14 MED ADMIN — 0.9% sodium chloride infusion: INTRAVENOUS | @ 03:00:00 | NDC 00409798309

## 2013-06-14 MED ADMIN — sodium chloride (NS) flush 5-10 mL: INTRAVENOUS | @ 01:00:00 | NDC 87701099893

## 2013-06-14 MED ADMIN — ceftriaxone (ROCEPHIN) 1 g in 0.9% sodium chloride (MBP/ADV) 50 mL MBP: INTRAVENOUS | @ 12:00:00 | NDC 00781320885

## 2013-06-14 MED ADMIN — insulin lispro (HUMALOG) injection: SUBCUTANEOUS | @ 10:00:00 | NDC 99999192301

## 2013-06-14 MED ADMIN — 0.9% sodium chloride infusion: INTRAVENOUS | @ 12:00:00 | NDC 00409798309

## 2013-06-14 MED ADMIN — hydralazine (APRESOLINE) 20 mg/mL injection 20 mg: INTRAVENOUS | @ 20:00:00 | NDC 63323061401

## 2013-06-14 MED ADMIN — acetaminophen (TYLENOL) suppository 650 mg: RECTAL | @ 23:00:00 | NDC 87701094618

## 2013-06-14 MED ADMIN — sodium chloride (NS) flush 5-10 mL: INTRAVENOUS | @ 10:00:00 | NDC 87701099893

## 2013-06-14 NOTE — Procedures (Signed)
Name:      Chelsea Schultz, Chelsea Schultz           Admitted:   06/13/2013                                           DOB:        07-May-1926  Account #: [id number]                  Age         79  Ref MD:                                  Location:  DATE:      06/14/2013                              ELECTROENCEPHALOGRAM REPORT      LOCATION:  [facility], bed 321.    REQUESTING PHYSICIAN:  Leylah Tarnow A. Gerri Lins, MD    INTERPRETING PHYSICIAN: Nayvie Lips A. Nashawn Hillock, MD    REASON FOR STUDY: An 79 year old female with altered mental status and  recent urinary tract infection. The patient has persistent encephalopathy  despite clearing recent UTI. This study is being dones to evaluate for  subclinical seizures.    CURRENT MEDICATIONS  1.  Tylenol.  2.  Benadryl.  3.  Zofran.  4.  Rocephin.    TECHNICAL: This study was performed using a digital EEG machine according  to the [employer] placement system. Multiple montages were  recorded. Photic stimulation was performed. Hyperventilation could not be  done due to decreased level of alertness. Single-lead EKG was monitored.    INTERPRETATION: Background is symmetric and medium in amplitude. There is  moderate generalized slowing, maximal in the frontal region, reaching 3-4  Hz bilaterally. At times, the posterior dominant rhythm reaches 5-6 Hz  bilaterally. Photic stimulation does not result in a clear driving response  on either side. Single-lead EKG revealed tachycardia. There were no focal  areas of slowing, epileptiform discharges or subclinical seizures.    IMPRESSION: This is an abnormal awake EEG recording. The presence of  moderate generalized slowing is suggestive of an encephalopathic process.  Potential causes are toxic, metabolic, infectious or degenerative  processes. There were no subclinical seizures, epileptiform discharges or  focal areas of slowing. Clinical correlation is necessary.        Reviewed on 06/15/2013 3:32 PM              Naija Troost Karren Burly, MD    cc:   Meryl Crutch, MD      MAA/wmx; D: 06/14/2013 03:19 P; T: 06/14/2013 04:01 P; DOC# [id number]; Job#  [id number]

## 2013-06-14 NOTE — Progress Notes (Signed)
Notified Dr Lorette Ang of Bp 193/93. Dr Lorette Ang will be putting in new orders for prn BP medication

## 2013-06-14 NOTE — Progress Notes (Signed)
Bedside shift change report given to [facility] (Cabin crew) by Morrie Sheldon (offgoing nurse). Report included the following information SBAR.

## 2013-06-14 NOTE — Progress Notes (Signed)
Echocardiogram completed.  Full report to follow.

## 2013-06-14 NOTE — Progress Notes (Signed)
eeg complete

## 2013-06-14 NOTE — Other (Signed)
Bedside and Verbal shift change report given to David Stall RN (oncoming nurse) by Avel Sensor (offgoing nurse). Report included the following information SBAR, Kardex, MAR, Accordion and Med Rec Status.

## 2013-06-14 NOTE — Progress Notes (Signed)
Reviewed Chart  Pt remains somnolent  Ativan should have worn off by now  EEG to be done this AM, but will also order Lumbar Puncture per IR to exclude CNS infection  Plavix and Lovenox discontinued for now  Added SCDs for DVT prophylaxis

## 2013-06-14 NOTE — Progress Notes (Signed)
Radiol  LP with 22 g spinal needle returned clear CSF.  9 ml in 4 tubes for labs.  Pt's daughter assisted patient for procedure.  No complication or blood loss.

## 2013-06-14 NOTE — Procedures (Signed)
Name:      Chelsea Schultz, Chelsea Schultz           Admitted:   06/13/2013                                           DOB:        10/19/1926  Account #: [id number]                  Age         79  Ref MD:                                  Location:  DATE:      06/14/2013                              ELECTROENCEPHALOGRAM REPORT      LOCATION:  [facility], bed 321.    REQUESTING PHYSICIAN:  Symone Cornman A. Andric Kerce, MD    INTERPRETING PHYSICIAN: Meadow Abramo A. Zianne Schubring, MD    REASON FOR STUDY: An 79-year-old female with altered mental status and  recent urinary tract infection. The patient has persistent encephalopathy  despite clearing recent UTI. This study is being dones to evaluate for  subclinical seizures.    CURRENT MEDICATIONS  1.  Tylenol.  2.  Benadryl.  3.  Zofran.  4.  Rocephin.    TECHNICAL: This study was performed using a digital EEG machine according  to the [employer] placement system. Multiple montages were  recorded. Photic stimulation was performed. Hyperventilation could not be  done due to decreased level of alertness. Single-lead EKG was monitored.    INTERPRETATION: Background is symmetric and medium in amplitude. There is  moderate generalized slowing, maximal in the frontal region, reaching 3-4  Hz bilaterally. At times, the posterior dominant rhythm reaches 5-6 Hz  bilaterally. Photic stimulation does not result in a clear driving response  on either side. Single-lead EKG revealed tachycardia. There were no focal  areas of slowing, epileptiform discharges or subclinical seizures.    IMPRESSION: This is an abnormal awake EEG recording. The presence of  moderate generalized slowing is suggestive of an encephalopathic process.  Potential causes are toxic, metabolic, infectious or degenerative  processes. There were no subclinical seizures, epileptiform discharges or  focal areas of slowing. Clinical correlation is necessary.        Reviewed on 06/15/2013 3:32 PM              Kyrel Leighton A  Lennox Dolberry, MD    cc:   Amron Guerrette A Yolette Hastings, MD      MAA/wmx; D: 06/14/2013 03:19 P; T: 06/14/2013 04:01 P; DOC# [id number]; Job#  [id number]

## 2013-06-14 NOTE — Progress Notes (Signed)
Physical Therapy    Chart reviewed. Attempted to see the patient this day. Patient having diagnostics performed. We will re-attempt as our schedule allows.  Thank you.    Percell Miller, PT, DPT, CLT

## 2013-06-14 NOTE — Progress Notes (Signed)
[facility]   Inpatient Neurology Stroke / TIA Follow up    Interval Hx     Slightly more arousable but mostly non-verbal, right sided gaze preference  Spontaneous movements of right side > left    EEG done; no subclinical seizure but does show moderate generalized slowing  MRI Brain showed tiny right frontal peri-ventricular ischemic stroke  TTE: EF 65%; RV, LA, RA normal size, no ASD/ PFO  Carotid dopplers: 0-49% ICA narrowing bilaterally, vertebrals with antegrade flow    Fasting Lipid Panel: Lab Results   Component Value Date/Time    Cholesterol, Total 176 12/12/2011  8:51 AM    Cholesterol, total 189 02/28/2010  9:42 AM    HDL Cholesterol 56 02/28/2010  9:42 AM    LDL, calculated 120 02/28/2010  9:42 AM    VLDL, calculated 13 02/28/2010  9:42 AM    Triglyceride 66 02/28/2010  9:42 AM       Review of Systems: no new weakness or sensory loss, no chest pain, no diarrhea, no new rash    Allergies:   No Known Allergies    Inpatient Meds  Current Facility-Administered Medications   Medication Dose Route Frequency   ??? sodium chloride (NS) flush 5-10 mL  5-10 mL IntraVENous Q8H   ??? 0.9% sodium chloride infusion  100 mL/hr IntraVENous CONTINUOUS   ??? insulin lispro (HUMALOG) injection   SubCUTAneous Q6H   ??? ceftriaxone (ROCEPHIN) 1 g in 0.9% sodium chloride (MBP/ADV) 50 mL MBP  1 g IntraVENous Q24H   ??? simvastatin (ZOCOR) tablet 20 mg  20 mg Oral QHS       PHYSICAL EXAM  Patient Vitals for the past 12 hrs:   Temp Pulse Resp BP SpO2   06/14/13 1428 99.4 ??F (37.4 ??C) 104 20 195/93 mmHg 100 %   06/14/13 0925 98.3 ??F (36.8 ??C) 103 18 165/76 mmHg 100 %   06/14/13 0615 - 104 18 182/88 mmHg 99 %     Focused Neurological Exam     Mental status: Lethargic, arouses to voice, only says few words to question    Hearing: hard to assess  Language: seems normal; follows commands on right side    CNs:   Visual fields:   Blinks to threat from R Visual Field   No blink to threat from L Visual Field    Extraocular movements:  conjugate but doesn't cross midline  Face: mild left lower facial weakness    Sensory: cannot assess d/t altered cognition    Motor:   Right upper extremity: 4/ 5  Left upper extremity: 0-1/ 5  Right lower extremity: 4/ 5  Left lower extremity: 0-1/ 5    Increased tone both lower exts    Reflexes:   Left upper/ lower ext: 3+  Right upper/ lower ext: 2+    Gait: due to the patient's condition, gait was not tested     -----------------------------  Recent Results (from the past 12 hour(s))   METABOLIC PANEL, COMPREHENSIVE    Collection Time     06/14/13  4:50 AM       Result Value Range    Sodium 130 (*) 136 - 145 mmol/L    Potassium 3.8  3.5 - 5.1 mmol/L    Chloride 97  97 - 108 mmol/L    CO2 22  21 - 32 mmol/L    Anion gap 11  5 - 15 mmol/L    Glucose 168 (*) 65 - 100 mg/dL  BUN 10  6 - 20 MG/DL    Creatinine 9.14  7.82 - 1.15 MG/DL    BUN/Creatinine ratio 17  12 - 20      GFR est AA >60  >60 ml/min/1.8m2    GFR est non-AA >60  >60 ml/min/1.58m2    Calcium 9.4  8.5 - 10.1 MG/DL    Bilirubin, total 0.4  0.2 - 1.0 MG/DL    ALT 20  12 - 78 U/L    AST 33  15 - 37 U/L    Alk. phosphatase 73  45 - 117 U/L    Protein, total 7.0  6.4 - 8.2 g/dL    Albumin 3.1 (*) 3.5 - 5.0 g/dL    Globulin 3.9  2.0 - 4.0 g/dL    A-G Ratio 0.8 (*) 1.1 - 2.2     CBC W/O DIFF    Collection Time     06/14/13  4:50 AM       Result Value Range    WBC 17.9 (*) 3.6 - 11.0 K/uL    RBC 4.73  3.80 - 5.20 M/uL    HGB 12.3  11.5 - 16.0 g/dL    HCT 95.6  21.3 - 08.6 %    MCV 80.1  80.0 - 99.0 FL    MCH 26.0  26.0 - 34.0 PG    MCHC 32.5  30.0 - 36.5 g/dL    RDW 57.8  46.9 - 62.9 %    PLATELET 288  150 - 400 K/uL   GLUCOSE, POC    Collection Time     06/14/13  5:56 AM       Result Value Range    Glucose (POC) 148 (*) 65 - 105 mg/dL    Performed by Prudence Davidson     GLUCOSE, POC    Collection Time     06/14/13  1:19 PM       Result Value Range    Glucose (POC) 138 (*) 65 - 105 mg/dL    Performed by Antonietta Jewel         Labs and imaging studies  reviewed    Hospital Problems Date Reviewed: 01/10/2013        ICD-9-CM Class Noted POA    Acute CVA (cerebrovascular accident)   06/14/2013 Yes        *Cerebral thrombosis with cerebral infarction 434.01  06/14/2013 Yes        Metabolic encephalopathy 348.31  06/13/2013 Yes        HTN (hypertension), benign 401.1  06/13/2013 Yes        DM type 2 (diabetes mellitus, type 2) 250.00  06/13/2013 Yes        SIRS (systemic inflammatory response syndrome) 995.90  06/13/2013 Yes        Vulvar atrophy (Chronic) 624.1  06/10/2012 Yes    Overview    Signed 06/10/2012 12:26 PM by Cyndia Diver, MD      E2 started 3x/week 05/2012          Back pain with radiation 724.5  10/25/2009 Yes              Assessment and Recommendations    1) Acute tiny right frontal-periventricular ischemic stroke (left hemiparesis, left visual field neglect)  Continue Plavix after LP done  Start Simvastatin 20 mg PO QHS when taking PO  Can start to gradually lower BPs (Systolics 160s to 190s) now that it's been 24 hours after event  Continue SCDs for DVT  prophylaxis    2) Recent UTI d/t E.Coli; ? Ongoing or treated    3) SIRS/ sepsis: tachycardia, elevated BPs. Blood Cultures negative x 1 day    4) Encephalopathy    No seizure on EEG/ moderate slowing  Not due to stroke as was tiny, focal  More likely d/t #2 and/ or #3  Asked IR to perform LP later today to evaluate for CNS infection    5) Will follow tomorrow

## 2013-06-14 NOTE — Progress Notes (Signed)
Bedside shift change report given to Vonzell Schlatter RN (oncoming nurse) by Gwenlyn Perking, RN (offgoing nurse). Report included the following information Kardex, Intake/Output, MAR, Accordion, Recent Results and Cardiac Rhythm Sinus Tachycardia.

## 2013-06-14 NOTE — Progress Notes (Signed)
Carotid duplex completed. Final results to follow.

## 2013-06-14 NOTE — Progress Notes (Signed)
[facility]  [address] Leonette Monarch [place], [place] [address]  [phone]      Medical Progress Note      NAME:         Chelsea Schultz   DOB:        1926/10/18  MRM:        [id number]    Date/Time:  06/14/2013  7:45 AM       Subjective:     Chief Complaint:     Patient has been seen and examined and chart reviewed. She is still not able to give much Hx. I have discussed with her family for collaborative Hx. She has not had any nausea, vomiting or seizure like activity    Objective:     Vitals:     Last 24hrs VS reviewed since prior progress note. Most recent are:    BP 182/88   Pulse 104   Temp(Src) 98.7 ??F (37.1 ??C)   Resp 18   Ht 4\' 7"  (1.397 m)   Wt 52.164 kg (115 lb)   BMI 26.73 kg/m2   SpO2 99%   LMP 10/23/1978     Intake/Output Summary (Last 24 hours) at 06/14/13 0745  Last data filed at 06/14/13 2725   Gross per 24 hour   Intake 1796.67 ml   Output      0 ml   Net 1796.67 ml        Exam:     Physical Exam:    Gen:   Weak and ill looking elderly female patient who is not in any distress  Eyes:  pink conjunctivae, PERRLA with no discharge.  ENT:   no ottorrhea or rhinorrhea with moist mucous membranes  Neck: no masses, thyroid non-tender and trachea central.  Pulm: clear breath sounds without crackles or wheezes  Card:  no JVD or murmurs, has regular and normal S1, S2 without thrills, bruits or peripheral edema  Abd:  Soft, non-tender, non-distended, normoactive bowel sounds are present, no palpable organomegaly  Musc:  No cyanosis, clubbing, atrophy or deformities.  Skin:  No rashes, bruising or ulcers.   Neuro: Somnolent but open eyes to pain, spontaneously moves right sided extremities    Medications:     Current Facility-Administered Medications   Medication Dose Route Frequency   ??? [COMPLETED] ceftriaxone (ROCEPHIN) 1 g in 0.9% sodium chloride (MBP/ADV) 50 mL MBP  1 g IntraVENous NOW   ??? [COMPLETED] labetalol (NORMODYNE;TRANDATE) injection  10 mg  10 mg IntraVENous NOW   ??? sodium chloride (NS) flush 5-10 mL  5-10 mL IntraVENous Q8H   ??? sodium chloride (NS) flush 5-10 mL  5-10 mL IntraVENous PRN   ??? acetaminophen (TYLENOL) tablet 650 mg  650 mg Oral Q4H PRN   ??? acetaminophen (TYLENOL) tablet 650 mg  650 mg Oral Q4H PRN   ??? diphenhydrAMINE (BENADRYL) injection 12.5 mg  12.5 mg IntraVENous Q4H PRN   ??? ondansetron (ZOFRAN) injection 4 mg  4 mg IntraVENous Q8H PRN   ??? 0.9% sodium chloride infusion  100 mL/hr IntraVENous CONTINUOUS   ??? enoxaparin (LOVENOX) injection 40 mg  40 mg SubCUTAneous Q24H   ??? insulin lispro (HUMALOG) injection   SubCUTAneous Q6H   ??? glucose chewable tablet 16 g  4 tablet Oral PRN   ??? dextrose (D50W) injection syrg 12.5-25 g  12.5-25 g IntraVENous PRN   ??? glucagon (GLUCAGEN) injection 1 mg  1 mg IntraMUSCular PRN   ??? [COMPLETED] lorazepam (ATIVAN) injection 0.5  mg  0.5 mg IntraVENous NOW   ??? ceftriaxone (ROCEPHIN) 1 g in 0.9% sodium chloride (MBP/ADV) 50 mL MBP  1 g IntraVENous Q24H   ??? clopidogrel (PLAVIX) tablet 75 mg  75 mg Oral DAILY   ??? simvastatin (ZOCOR) tablet 20 mg  20 mg Oral QHS        Lab data Review:     Recent Labs      06/14/13   0450  06/13/13   0530   WBC  17.9*  14.8*   HGB  12.3  12.5   HCT  37.9  38.8   PLT  288  276     Recent Labs      06/14/13   0450  06/13/13   0530   NA  130*  138   K  3.8  3.4*   CL  97  99   CO2  22  27   GLU  168*  194*   BUN  10  11   CREA  0.59  0.65   CA  9.4  10.1   ALB  3.1*  4.0   SGOT  33  23   ALT  20  20     No components found with this basename: glpoc     Telemetry reviewed:   normal sinus rhythm    MRI brain - Tiny acute infarct in the right frontal periventricular region. Extensive deep white matter signal abnormality and chronic right pontine lacunar infarct consistent with intracranial microangiopathy.     MRA brain - negative    Assessment / Plan:     Chelsea Schultz is a 79 y.o. female being seen as a follow up for:     Acute CVA (cerebrovascular accident) POA(06/14/2013):  involving the right frontal periventricular region   - she has been reviewed by neurology   - cont to keep NPO until reviewed by speech therapy   - awaiting doppler carotids and an Echocardiogram   - PT and OT when stable   - cont plavix and simvastatin when able to take orally    Metabolic encephalopathy (06/13/2013)   - likely from underlying stroke although her UTI can be a contributory factor   - continue to Rx infection with IV antibiotics   - obtaining an EEG to exclude a seizure as she continues to be somnolent   - neuro checks until awake    Back pain with radiation (10/25/2009)   - currently asymptomatic   - prn pain medications when able to take orally    HTN (hypertension), benign (06/13/2013)   - BP still elevated but acceptable   - start IV labetalol later in the afternoon today   - resume home medications when able to take orally    DM type 2 (diabetes mellitus, type 2) (06/13/2013)   - she remains to be NPO and blood glucose is 168 this AM   - A1c is 6.5   - cont SSI per protocol for now    Sepsis due to E coli UTI POA (06/13/2013)   - I have reviewed her urine cultures [facility] showing E coli   - cont rocephin for now    Hyponatremia   - will continue IV NSS and monitor BMP      Total time spent for the patient's care: 35 Minutes                  Care Plan discussed with: Family and Nursing Staff  Discussed:  Care Plan and D/C Planning    Prophylaxis:  Lovenox    Disposition:  [facility]/Rehab           ___________________________________________________    Attending Physician:   Melynda Keller, MD

## 2013-06-15 LAB — METABOLIC PANEL, BASIC
Anion gap: 13 mmol/L (ref 5–15)
BUN/Creatinine ratio: 18 (ref 12–20)
BUN: 9 MG/DL (ref 6–20)
CO2: 23 mmol/L (ref 21–32)
Calcium: 9.9 MG/DL (ref 8.5–10.1)
Chloride: 97 mmol/L (ref 97–108)
Creatinine: 0.51 MG/DL (ref 0.45–1.15)
GFR est AA: >60 mL/min/{1.73_m2} (ref >60)
GFR est non-AA: >60 mL/min/{1.73_m2} (ref >60)
Glucose: 141 mg/dL — ABNORMAL HIGH (ref 65–100)
Potassium: 3.5 mmol/L (ref 3.5–5.1)
Sodium: 133 mmol/L — ABNORMAL LOW (ref 136–145)

## 2013-06-15 LAB — CK W/ CKMB & INDEX
CK - MB: 6.6 NG/ML — ABNORMAL HIGH (ref 0.5–3.6)
CK-MB Index: 0.7 (ref 0–2.5)
CK: 912 U/L — ABNORMAL HIGH (ref 26–192)

## 2013-06-15 LAB — CBC W/O DIFF
HCT: 40.7 % (ref 35.0–47.0)
HGB: 13.4 g/dL (ref 11.5–16.0)
MCH: 26.1 PG (ref 26.0–34.0)
MCHC: 32.9 g/dL (ref 30.0–36.5)
MCV: 79.3 FL — ABNORMAL LOW (ref 80.0–99.0)
PLATELET: 269 10*3/uL (ref 150–400)
RBC: 5.13 M/uL (ref 3.80–5.20)
RDW: 14.6 % — ABNORMAL HIGH (ref 11.5–14.5)
WBC: 15.9 10*3/uL — ABNORMAL HIGH (ref 3.6–11.0)

## 2013-06-15 LAB — INDIA INK PREP: INDIA INK: NONE SEEN

## 2013-06-15 LAB — GLUCOSE, POC
Glucose (POC): 137 mg/dL — ABNORMAL HIGH (ref 65–105)
Glucose (POC): 140 mg/dL — ABNORMAL HIGH (ref 65–105)
Glucose (POC): 149 mg/dL — ABNORMAL HIGH (ref 65–105)
Glucose (POC): 93 mg/dL (ref 65–105)

## 2013-06-15 LAB — CELL COUNT, CSF
CSF RBCs: 0 /mm3
CSF TUBE NO.: 1
CSF WBCs: 2 /mm3 (ref 0–5)

## 2013-06-15 LAB — MAGNESIUM: Magnesium: 1.6 mg/dL (ref 1.6–2.4)

## 2013-06-15 LAB — HSV BY PCR, CSF ONLY: Herpes simplex, CSF: NOT DETECTED

## 2013-06-15 LAB — TROPONIN I: Troponin-I, Qt.: 0.15 ng/mL — ABNORMAL HIGH (ref <0.05)

## 2013-06-15 LAB — PHOSPHORUS: Phosphorus: 2.4 MG/DL — ABNORMAL LOW (ref 2.5–4.9)

## 2013-06-15 MED ADMIN — hydralazine (APRESOLINE) 20 mg/mL injection 20 mg: INTRAVENOUS | @ 23:00:00 | NDC 63323061401

## 2013-06-15 MED ADMIN — haloperidol lactate (HALDOL) injection 1 mg: INTRAVENOUS | @ 23:00:00 | NDC 63323047401

## 2013-06-15 MED ADMIN — 0.9% sodium chloride infusion: INTRAVENOUS | NDC 00409798309

## 2013-06-15 MED ADMIN — sodium chloride (NS) flush 5-10 mL: INTRAVENOUS | @ 12:00:00 | NDC 87701099893

## 2013-06-15 MED ADMIN — sodium chloride (NS) flush 5-10 mL: INTRAVENOUS | @ 05:00:00 | NDC 87701099893

## 2013-06-15 MED ADMIN — enoxaparin (LOVENOX) injection 40 mg: SUBCUTANEOUS | @ 17:00:00 | NDC 00075801401

## 2013-06-15 MED ADMIN — ceftriaxone (ROCEPHIN) 1 g in 0.9% sodium chloride (MBP/ADV) 50 mL MBP: INTRAVENOUS | @ 12:00:00 | NDC 00409733201

## 2013-06-15 MED ADMIN — 0.9% sodium chloride infusion: INTRAVENOUS | @ 12:00:00 | NDC 00409798309

## 2013-06-15 MED ADMIN — sodium chloride (NS) flush 5-10 mL: INTRAVENOUS | @ 17:00:00 | NDC 87701099893

## 2013-06-15 MED ADMIN — insulin lispro (HUMALOG) injection: SUBCUTANEOUS | @ 17:00:00 | NDC 99999192301

## 2013-06-15 NOTE — Consults (Signed)
Reason for Consult: Abnormal EKG and Positive troponin, Stroke.       HPI: Chelsea Schultz is 79 yo with PMH of DM, HTN, CVA, admitted with second episode of CVA in right frontal Periventricular region. History obtained from daughter in the room as patient is somnolent. Per her daughter the patient does not have any cardiac history. No chest pain prior to the stroke. No SOB. The EKG from 8/22 shows sinus tach with anterior st changes of ischemia. Subsequent EKG does not demonstrate these changes.     Past Medical History   Diagnosis Date   ??? Diabetes    ??? Hypertension    ??? Neuropathy    ??? Spinal stenosis    ??? CVA (cerebral infarction)    ??? GERD (gastroesophageal reflux disease)    ??? Nausea & vomiting    ??? Arthritis    ??? Osteopetrosis             Past Surgical History   Procedure Laterality Date   ??? Hx orthopaedic       Lumbar laminectomy   ??? Hx orthopaedic       Lumbar fusion             Family History   Problem Relation Age of Onset   ??? Diabetes Mother    ??? Stroke Mother    ??? Hypertension Mother    ??? Hypertension Father    ??? Stroke Father    ??? Cancer Sister    ??? Stroke Sister            History     Social History   ??? Marital Status: WIDOWED     Spouse Name: N/A     Number of Children: N/A   ??? Years of Education: N/A     Occupational History   ??? Not on file.     Social History Main Topics   ??? Smoking status: Never Smoker    ??? Smokeless tobacco: Never Used   ??? Alcohol Use: No   ??? Drug Use: No   ??? Sexually Active: No     Other Topics Concern   ??? Not on file     Social History Narrative   ??? No narrative on file         No Known Allergies         Current Facility-Administered Medications   Medication Dose Route Frequency Provider Last Rate Last Dose   ??? clopidogrel (PLAVIX) tablet 75 mg  75 mg Oral DAILY Jamie Brookes, MD       ??? amlodipine (NORVASC) tablet 5 mg  5 mg Oral DAILY Jamie Brookes, MD       ??? enoxaparin (LOVENOX) injection 40 mg  40 mg SubCUTAneous Q24H Jamie Brookes, MD   40 mg at 06/15/13 1321   ??? hydralazine  (APRESOLINE) 20 mg/mL injection 20 mg  20 mg IntraVENous Q6H PRN Melynda Keller, MD   20 mg at 06/14/13 1545   ??? [EXPIRED] acetaminophen (TYLENOL) suppository 650 mg  650 mg Rectal ONCE Melynda Keller, MD       ??? sodium chloride (NS) flush 5-10 mL  5-10 mL IntraVENous Q8H Melynda Keller, MD   10 mL at 06/15/13 1322   ??? sodium chloride (NS) flush 5-10 mL  5-10 mL IntraVENous PRN Melynda Keller, MD       ??? acetaminophen (TYLENOL) tablet 650 mg  650 mg Oral Q4H PRN Melynda Keller, MD       ???  acetaminophen (TYLENOL) tablet 650 mg  650 mg Oral Q4H PRN Melynda Keller, MD       ??? diphenhydrAMINE (BENADRYL) injection 12.5 mg  12.5 mg IntraVENous Q4H PRN Melynda Keller, MD       ??? ondansetron [facility]) injection 4 mg  4 mg IntraVENous Q8H PRN Melynda Keller, MD       ??? 0.9% sodium chloride infusion  100 mL/hr IntraVENous CONTINUOUS Melynda Keller, MD 100 mL/hr at 06/15/13 0736 100 mL/hr at 06/15/13 0736   ??? insulin lispro (HUMALOG) injection   SubCUTAneous Q6H Melynda Keller, MD   2 Units at 06/15/13 1321   ??? glucose chewable tablet 16 g  4 tablet Oral PRN Melynda Keller, MD       ??? dextrose (D50W) injection syrg 12.5-25 g  12.5-25 g IntraVENous PRN Melynda Keller, MD       ??? glucagon (GLUCAGEN) injection 1 mg  1 mg IntraMUSCular PRN Melynda Keller, MD       ??? ceftriaxone (ROCEPHIN) 1 g in 0.9% sodium chloride (MBP/ADV) 50 mL MBP  1 g IntraVENous Q24H Melynda Keller, MD 100 mL/hr at 06/15/13 0820 1 g at 06/15/13 0820   ??? simvastatin (ZOCOR) tablet 20 mg  20 mg Oral QHS Mudassar Asghar, MD            ROS:  Cannot be performed due to patient somnolent and uncooperative due to stroke.      Physical Exam:  BP 166/71   Pulse 94   Temp(Src) 98.7 ??F (37.1 ??C)   Resp 18   Ht 4\' 7"  (1.397 m)   Wt 115 lb (52.164 kg)   BMI 26.73 kg/m2   SpO2 100%   LMP 10/23/1978    Gen:  Well-developed, well-nourished, in no acute distress  HEENT:  Pink conjunctivae, PERRL, moist mucous membranes  Neck:  Supple, without masses, thyroid  non-tender  Resp:  No accessory muscle use, clear breath sounds without wheezes rales or rhonchi  Card:  No murmurs, normal S1, S2 without thrills, bruits or peripheral edema  Abd:  Soft, non-tender, non-distended, normoactive bowel sounds are present, no palpable organomegaly and no detectable hernias  Lymph:  No cervical or inguinal adenopathy  Musc:  No cyanosis or clubbing  Skin:  No rashes or ulcers, skin turgor is good  Neuro:  Not performed due to patient somnolent and uncooperative due to stroke.        Labs:       Component Value Date/Time   WBC 15.9 06/15/2013 12:20 PM   HGB 13.4 06/15/2013 12:20 PM   HCT 40.7 06/15/2013 12:20 PM   PLATELET 269 06/15/2013 12:20 PM   MCV 79.3 06/15/2013 12:20 PM       Component Value Date/Time   Hemoglobin A1c 6.5 06/13/2013  5:30 AM   Hemoglobin A1c 6.1 05/23/2012 12:54 PM   Hemoglobin A1c 6.3 12/12/2011  8:51 AM   Glucose 141 06/15/2013 12:20 PM   Glucose (POC) 149 06/15/2013 11:48 AM   Glucose POC 137 03/10/2011  9:27 AM   Microalb/Creat ratio (ug/mg creat.) 197.3 05/23/2012 12:54 PM   LDL, calculated 120 02/28/2010  9:42 AM   Creatinine 0.51 06/15/2013 12:20 PM        Component Value Date/Time   Cholesterol, Total 176 12/12/2011  8:51 AM   Cholesterol, total 189 02/28/2010  9:42 AM   HDL Cholesterol 56 02/28/2010  9:42 AM   LDL, calculated 120 02/28/2010  9:42  AM   Triglyceride 66 02/28/2010  9:42 AM       Component Value Date/Time   ALT 20 06/14/2013  4:50 AM   AST 33 06/14/2013  4:50 AM   Alk. phosphatase 73 06/14/2013  4:50 AM   Bilirubin, total 0.4 06/14/2013  4:50 AM     Lab Results   Component Value Date/Time    INR 1.1 01/10/2013  4:26 PM    Prothrombin time 11.2 01/10/2013  4:26 PM        Component Value Date/Time   GFR est AA >60 06/15/2013 12:20 PM   GFR est non-AA >60 06/15/2013 12:20 PM   Creatinine 0.51 06/15/2013 12:20 PM   BUN 9 06/15/2013 12:20 PM   Sodium 133 06/15/2013 12:20 PM   Potassium 3.5 06/15/2013 12:20 PM   Chloride 97 06/15/2013 12:20 PM   CO2 23 06/15/2013 12:20 PM        Component  Value Date/Time   TSH 2.510 12/12/2011  8:51 AM      Lab Results   Component Value Date/Time    Glucose 141 06/15/2013 12:20 PM    Glucose (POC) 149 06/15/2013 11:48 AM    Glucose POC 137 03/10/2011  9:27 AM         Recent Labs      06/15/13   1220   CPK  912*   CKMB  6.6*   TROIQ  0.15*     Diagnostic Tests:   EKG 8/22: Sinus tachycardia  Marked ST abnormality, possible anterior subendocardial injury  Abnormal ECG  When compared with ECG of 02-Apr-2012 20:35,  Vent. rate has increased BY 49 BPM  ST now depressed in Anterior leads  Nonspecific T wave abnormality, worse in Inferior leads  T wave inversion now evident in Anterior leads        Problem List:     Problem List Date Reviewed: 01/10/2013        ICD-9-CM Class Noted    Acute CVA (cerebrovascular accident)   06/14/2013        *Cerebral thrombosis with cerebral infarction 434.01  06/14/2013        Metabolic encephalopathy 348.31  06/13/2013        HTN (hypertension), benign 401.1  06/13/2013        DM type 2 (diabetes mellitus, type 2) 250.00  06/13/2013        SIRS (systemic inflammatory response syndrome) 995.90  06/13/2013        Vulvar atrophy (Chronic) 624.1  06/10/2012    Overview    Signed 06/10/2012 12:26 PM by Cyndia Diver, MD      E2 started 3x/week 05/2012          SUI (stress urinary incontinence, female) 625.6  06/10/2012        Urgency incontinence 788.31  06/10/2012    Overview    Signed 06/10/2012 12:29 PM by Cyndia Diver, MD      Neurogenic s/p CVA and spinal surg no CMG yet. PT started          UTI (urinary tract infection) 599.0  06/10/2012    Overview    Signed 06/10/2012  1:31 PM by Cyndia Diver, MD      Ecoli 03/2012  Klebsiella 05/23/12  No sx          Diabetes 250.00  10/25/2009        Back pain with radiation 724.5  10/25/2009        HTN (hypertension) 401.9  08/22/2009              Plan:    1. EKG abnormality with Troponin positive: I don't this she has ACS. The EKG and troponin are circumstantial to stroke which may be the cause of abnomality in these  test. She may very well have significant CAD but does not behave like ACS. Continue Plavix. Add ASA. supportive care.   2. HTN: BP remains high. Add Lopressor.    3. Dysplidemia: Continue Zocor  4. Stroke: Supportive care.   5. UTI.   6. DM      Hamed Debella K. Dagoberto Reef, MD, [facility]

## 2013-06-15 NOTE — Progress Notes (Signed)
Problem: Mobility Impaired (Adult and Pediatric)  Goal: *Acute Goals and Plan of Care (Insert Text)  Physical Therapy Goals  Initiated 06/15/2013  1. Patient will move from supine to sit and sit to supine in bed with moderate assistance within 7 day(s).   2. Patient will transfer from sit to stand with moderate assistance of two using the least restrictive device within 7 day(s).  PHYSICAL THERAPY EVALUATION  Patient: Chelsea Schultz (79 y.o. female)  Date: 06/15/2013  Primary Diagnosis: Metabolic encephalopathy        Precautions: falls         ASSESSMENT :  Based on the objective data described below, the patient presents with nonverbal, decreased command follow, leans to right (in bed) and right and posterior in sitting, and poor sitting balance. Prior to admit the patient was walking with a rolling walker without assist, going to outpt PT since second back surgery and lives with her daughter Olegario Messier.  Current Evaluation was limited due to decreased command follow - no verbalizations, inconsistent following "squeeze my hand".  Noted patient was able to move all four extremities and withdraws to pressure on bottom of feet.  Patient keeps her head side bent to right in supine.  Able to tolerate sitting at edge of bed with total assist of two for transfers.    Patient will benefit from skilled intervention to address the above impairments.  Patient???s rehabilitation potential is considered to be Fair  Factors which may influence rehabilitation potential include:   [ ]          None noted  [X]          Mental ability/status  [X]          Medical condition  [ ]          Home/family situation and support systems  [ ]          Safety awareness  [ ]          Pain tolerance/management  [ ]          Other:        PLAN :  Recommendations and Planned Interventions:  [X]            Bed Mobility Training             [ ]     Neuromuscular Re-Education  [X]            Transfer Training                   [ ]     Orthotic/Prosthetic Training   [ ]            Gait Training                         [ ]     Modalities  [X]            Therapeutic Exercises           [ ]     Edema Management/Control  [X]            Therapeutic Activities            [X]     Patient and Family Training/Education  [ ]            Other (comment):    Frequency/Duration: Patient will be followed by physical therapy 5 times a week to address goals.  Discharge Recommendations: Rehab  Further Equipment Recommendations for Discharge: tbd       SUBJECTIVE:  Patient stated - no verbalizations during evaluation      OBJECTIVE DATA SUMMARY:       Past Medical History   Diagnosis Date   ??? Diabetes     ??? Hypertension     ??? Neuropathy     ??? Spinal stenosis     ??? CVA (cerebral infarction)     ??? GERD (gastroesophageal reflux disease)     ??? Nausea & vomiting     ??? Arthritis     ??? Osteopetrosis       Past Surgical History   Procedure Laterality Date   ??? Hx orthopaedic           Lumbar laminectomy   ??? Hx orthopaedic           Lumbar fusion     Prior Level of Function/Home Situation: was walking with a rolling walker without assist, going to outpt PT since second back surgery   Home Situation  Home Environment: Private residence  One/Two Story Residence: Two story, live on 1st floor  Living Alone: No  Support Systems: Child(ren)  Patient Expects to be Discharged to:: Private residence  Current DME Used/Available at Home: Grab bars;Walker;Shower chair;Hospital bed  Critical Behavior:  Neurologic State: Eyes open to stimulus;Eyes open to voice  Orientation Level: Unable to verbalize  Cognition: Unable to assess (comment)       Strength:    Strength:  (unable to fully test due to dec command follow)                    Tone & Sensation:                  Sensation:  (intact bil feet to pressure)               Range Of Motion:  AROM: Generally decreased, functional (AROM noted all 4 extrem but limited testing d/t dec command )           PROM: Within functional limits           Coordination:  Coordination:   (unable to test)    Functional Mobility:  Bed Mobility:  Rolling: Total assistance;Assist X2;Verbal cues  Supine to Sit: Total assistance;Assist X2;Additional time;Verbal cues  Sit to Supine: Total assistance;Assist X2;Verbal cues     Transfers:                             Balance:   Sitting: Impaired  Sitting - Static: Poor (constant support)  Sitting - Dynamic: Poor (constant support)  Ambulation/Gait Training:        Ambulation - Level of Assistance: Unable at this time (sitting balance poor)                                                            Pain:  Pain Scale 1: Visual  Pain Intensity 1: 0              Activity Tolerance:   Grimaces and sometimes resists all transfers.  Unclear if patient has back pain or pain in buttocks/skin  Please refer to the flowsheet for vital signs taken during this treatment.  After treatment:   [ ]   Patient left in no apparent distress sitting up in chair  [X]          Patient left in no apparent distress in bed  [X]          Call bell left within reach  [X]          Nursing notified  [ ]          Caregiver present  [ ]          Bed alarm activated      COMMUNICATION/EDUCATION:   The patient???s plan of care was discussed with: Registered Nurse and pt's daughter.  [X]          Fall prevention education was provided and the patient/caregiver indicated understanding.  [X]          Patient/family have participated as able in goal setting and plan of care.  [X]          Patient/family agree to work toward stated goals and plan of care.  [ ]          Patient understands intent and goals of therapy, but is neutral about his/her participation.  [ ]          Patient is unable to participate in goal setting and plan of care.    Thank you for this referral.  Brita Romp, PT

## 2013-06-15 NOTE — Progress Notes (Addendum)
[facility]  [address] [place], [place] [address]  [phone]      Medical Progress Note      NAME: Chelsea Schultz   DOB:  12-11-1925  MRM:  [id number]    Date/Time: 06/15/2013  10:44 AM         Subjective:     Chief Complaint:  F/u AMS, CVA    Mental status remains altered as per daughter. Was able to recognize some family members yesterday but this morning daughter questions her ability to recognize her. Now moving LUE and LLE. Nonverbal on my evaluation however did not appear to be in pain    LP done yesterday showing no WBCs, gram stain negative. CSF protein minimally elevated    ROS:  (bold if positive, if negative)                               Objective:       Vitals:          Last 24hrs VS reviewed since prior progress note. Most recent are:    Visit Vitals   Item Reading   ??? BP 155/69   ??? Pulse 102   ??? Temp 97.7 ??F (36.5 ??C)   ??? Resp 18   ??? Ht 4\' 7"  (1.397 m)   ??? Wt 52.164 kg (115 lb)   ??? BMI 26.73 kg/m2   ??? SpO2 99%     SpO2 Readings from Last 6 Encounters:   06/15/13 99%   04/03/12 97%   07/01/10 98%    O2 Flow Rate (L/min): 2 l/min   No intake or output data in the 24 hours ending 06/15/13 1044       Exam:     Physical Exam:    Gen:  Well-developed, well-nourished, in no acute distress. Nonverbal  HEENT:  Sclerae nonicteric, hearing intact to voice, mucous membranes moist  Neck:  Supple, without masses.  Resp:  No accessory muscle use, CTAB without wheezes, rales, or rhonchi  Card: RRR, without m/r/g. No LE edema.   Abd:  +bowel sounds, soft, NTTP, nondistended. No HSM.   Neuro: no focal deficits appreciated, does not follow commands appropriately  Psych:  Awake, Alert, disoriented. Nonverbal     Medications Reviewed: (see below)    Lab Data Reviewed: (see below)    ______________________________________________________________________    Medications:     Current Facility-Administered Medications   Medication Dose Route Frequency   ??? hydralazine (APRESOLINE) 20  mg/mL injection 20 mg  20 mg IntraVENous Q6H PRN   ??? [EXPIRED] acetaminophen (TYLENOL) suppository 650 mg  650 mg Rectal ONCE   ??? sodium chloride (NS) flush 5-10 mL  5-10 mL IntraVENous Q8H   ??? sodium chloride (NS) flush 5-10 mL  5-10 mL IntraVENous PRN   ??? acetaminophen (TYLENOL) tablet 650 mg  650 mg Oral Q4H PRN   ??? acetaminophen (TYLENOL) tablet 650 mg  650 mg Oral Q4H PRN   ??? diphenhydrAMINE (BENADRYL) injection 12.5 mg  12.5 mg IntraVENous Q4H PRN   ??? ondansetron (ZOFRAN) injection 4 mg  4 mg IntraVENous Q8H PRN   ??? 0.9% sodium chloride infusion  100 mL/hr IntraVENous CONTINUOUS   ??? insulin lispro (HUMALOG) injection   SubCUTAneous Q6H   ??? glucose chewable tablet 16 g  4 tablet Oral PRN   ??? dextrose (D50W) injection syrg 12.5-25 g  12.5-25 g IntraVENous PRN   ???  glucagon (GLUCAGEN) injection 1 mg  1 mg IntraMUSCular PRN   ??? ceftriaxone (ROCEPHIN) 1 g in 0.9% sodium chloride (MBP/ADV) 50 mL MBP  1 g IntraVENous Q24H   ??? simvastatin (ZOCOR) tablet 20 mg  20 mg Oral QHS            Lab Review:     Recent Labs      06/14/13   0450  06/13/13   0530   WBC  17.9*  14.8*   HGB  12.3  12.5   HCT  37.9  38.8   PLT  288  276     Recent Labs      06/14/13   0450  06/13/13   0530   NA  130*  138   K  3.8  3.4*   CL  97  99   CO2  22  27   GLU  168*  194*   BUN  10  11   CREA  0.59  0.65   CA  9.4  10.1   ALB  3.1*  4.0   SGOT  33  23   ALT  20  20     No components found with this basename: glpoc     No results found for this basename: PH, PCO2, PO2, HCO3, FIO2,  in the last 72 hours  No results found for this basename: INR,  in the last 72 hours  Lab Results   Component Value Date/Time    Specimen Description: URINE 04/02/2012  8:00 PM     Lab Results   Component Value Date/Time    Culture result: PENDING 06/14/2013  7:13 PM    Culture result: NO GROWTH 2 DAYS 06/13/2013  5:20 AM    Culture result: ESCHERICHIA COLI 04/02/2012  8:00 PM       MRA brain 8/22: IMPRESSION: Negative MRA of the head.    MRI brain 8/22:    1. Tiny  acute infarct in the right frontal periventricular region.   2. Extensive deep white matter signal abnormality and chronic right pontine   lacunar infarct consistent with intracranial microangiopathy.     Carotid dopplers: 8/23  IMPRESSION: Findings are consistent with 0-49% stenosis of the right   internal carotid and 0-49% stenosis of the left internal carotid.   Vertebrals are patent with antegrade flow. NOTE: Velocities may be   higher than obtain seondary to shadowing from calcific plaque.    TTE 8/23  SUMMARY:  Left ventricle: Size was normal. Systolic function was normal. Ejection  fraction was estimated to be 65 %. There were no regional wall motion  abnormalities. There was mild concentric hypertrophy. Doppler parameters  were consistent with abnormal left ventricular relaxation (grade 1  diastolic dysfunction).         Assessment / Plan:       Acute CVA: MRI findings as above, w/ tiny acute infarct in the right frontal periventricular region and extensive deep white matter signal abnormality and chronic right pontine lacunar infarct consistent with intracranial microangiopathy.  -- Resume Plavix now LP is done. Cont simvastatin. Neuro following      Encephalopathy (06/13/2013): unclear etiology. With tiny acute CVA however question ability of small infarct causing such acute change in mentation. Also with E. Coli UTI POA. Na 130 on 8/23. EKG w/ ST-T changes on admission  -- repeat BMP, CBC now  -- Treating UTI as below  -- f/u LP studies (VDRL, HSV)   -- repeat EKG  to check for interval changes. Check one set of cardiac markers    UTI: POA, apparently E. Coli as per Dr. Nicoletta Dress. Was treated w/ macrobid prior to admission. UA on admission w/o pyuria  -- cont CTX for now      SIRS (systemic inflammatory response syndrome) (06/13/2013): Had UTI POA, now w/ leukocytosis and tachycardia. UA on admission w/o pyuria  -- repeat CBC (had shown worsening leukocytosis on 8/23)  -- f/u blood cultures, LP results       HTN  (hypertension), benign (06/13/2013): SBP 150s - 160s this morning, on amlodpine 5mg  and lasix 40mg  and Diovan 320mg  at home. Permissive hypertension for 24 hours  -- will restart amlodipine      DM type 2 (diabetes mellitus, type 2) (06/13/2013): on on Januvia and metformin at home. A1C 6.5  -- SSI for now    Surrogate decision maker: daughter    Total time spent with patient: 35 minutes                  Care Plan discussed with: Patient, Family and Nursing Staff    Discussed:  Care Plan    Prophylaxis:  SCD's, resume Lovenox after LP    Disposition:  Home w/Family vs SNF/LTC           ___________________________________________________    Attending Physician: Jamie Brookes, MD     ADDDENDUM: 7pm  paged by nursing stating pt is agitated, pulling at lines, O2. Unable to verbally redirect. Attempted seroquel however per nursing unsafe to take PO. Will give low dose IV haldol (1mg ). QTC 470s, Mg 1.6. Repleted earlier.  Family will be in tonight, no need for 1:1 sitter per nursing

## 2013-06-15 NOTE — Progress Notes (Signed)
[facility]   Inpatient Neurology Stroke / TIA Follow up    Interval Hx     More awake than yesterday  Family / RN say much more spontaneous movement of left side, lifting arm to face, moving left leg  Still not saying much     LP: 2 WBC, 0 RBC, prot 51, glu 101 (h), [place] ink neg, gram stain no organisms/ no wbcs, VDRL and HSV PCR pending    EEG done; no subclinical seizure but does show moderate generalized slowing  MRI Brain showed tiny right frontal peri-ventricular ischemic stroke  TTE: EF 65%; RV, LA, RA normal size, no ASD/ PFO  Carotid dopplers: 0-49% ICA narrowing bilaterally, vertebrals with antegrade flow    Fasting Lipid Panel: Lab Results   Component Value Date/Time    Cholesterol, Total 176 12/12/2011  8:51 AM    Cholesterol, total 189 02/28/2010  9:42 AM    HDL Cholesterol 56 02/28/2010  9:42 AM    LDL, calculated 120 02/28/2010  9:42 AM    VLDL, calculated 13 02/28/2010  9:42 AM    Triglyceride 66 02/28/2010  9:42 AM       Review of Systems: no new weakness or sensory loss, no chest pain, no diarrhea, no new rash    Allergies:   No Known Allergies    Inpatient Meds  Current Facility-Administered Medications   Medication Dose Route Frequency   ??? clopidogrel (PLAVIX) tablet 75 mg  75 mg Oral DAILY   ??? amlodipine (NORVASC) tablet 5 mg  5 mg Oral DAILY   ??? enoxaparin (LOVENOX) injection 40 mg  40 mg SubCUTAneous Q24H   ??? [EXPIRED] acetaminophen (TYLENOL) suppository 650 mg  650 mg Rectal ONCE   ??? sodium chloride (NS) flush 5-10 mL  5-10 mL IntraVENous Q8H   ??? 0.9% sodium chloride infusion  100 mL/hr IntraVENous CONTINUOUS   ??? insulin lispro (HUMALOG) injection   SubCUTAneous Q6H   ??? ceftriaxone (ROCEPHIN) 1 g in 0.9% sodium chloride (MBP/ADV) 50 mL MBP  1 g IntraVENous Q24H   ??? simvastatin (ZOCOR) tablet 20 mg  20 mg Oral QHS       PHYSICAL EXAM  Patient Vitals for the past 12 hrs:   Temp Pulse Resp BP SpO2   06/15/13 1017 97.7 ??F (36.5 ??C) 102 18 155/69 mmHg 99 %   06/15/13 0545 98.1 ??F (36.7 ??C)  101 18 152/70 mmHg 100 %   06/15/13 0150 98.2 ??F (36.8 ??C) 110 16 167/73 mmHg 100 %     Focused Neurological Exam     Mental status: Less lethargic, arouses to voice, minimal verbalization  Hearing: hard to assess  Language: hard to assess; no dysarthria when she speaks    CNs:      Extraocular movements: conjugate in all directions  Face: mild left lower facial weakness    Sensory: cannot assess d/t altered cognition    Motor:   Right upper extremity: 4/ 5  Left upper extremity: 4 / 5  Right lower extremity: 4/ 5  Left lower extremity: 4 / 5    Gait: due to the patient's condition, gait was not tested     -----------------------------  Recent Results (from the past 12 hour(s))   GLUCOSE, POC    Collection Time     06/15/13 12:28 AM       Result Value Range    Glucose (POC) 137 (*) 65 - 105 mg/dL    Performed by Lura Em  GLUCOSE, POC    Collection Time     06/15/13  6:37 AM       Result Value Range    Glucose (POC) 140 (*) 65 - 105 mg/dL    Performed by Garner Gavel     GLUCOSE, POC    Collection Time     06/15/13 11:48 AM       Result Value Range    Glucose (POC) 149 (*) 65 - 105 mg/dL    Performed by Haywood Lasso         Labs and imaging studies reviewed    Hospital Problems Date Reviewed: 01/10/2013        ICD-9-CM Class Noted POA    Acute CVA (cerebrovascular accident)   06/14/2013 Yes        *Cerebral thrombosis with cerebral infarction 434.01  06/14/2013 Yes        Metabolic encephalopathy 348.31  06/13/2013 Yes        HTN (hypertension), benign 401.1  06/13/2013 Yes        DM type 2 (diabetes mellitus, type 2) 250.00  06/13/2013 Yes        SIRS (systemic inflammatory response syndrome) 995.90  06/13/2013 Yes        Vulvar atrophy (Chronic) 624.1  06/10/2012 Yes    Overview    Signed 06/10/2012 12:26 PM by Cyndia Diver, MD      E2 started 3x/week 05/2012          Back pain with radiation 724.5  10/25/2009 Yes              Assessment and Recommendations    1) Acute tiny right frontal-periventricular  ischemic stroke; resolving left hemiparesis  Continue Plavix and Simvasatin  IM/ hospitalist addressing BPs    Continue SCDs for DVT prophylaxis    2) Encephalopathy, metabolic  No seizure on EEG/ moderate slowing  No CNS infection; Greatly appreciate IR/ Dr. Bonna Gains help  IM/ Hospitalist rechecking CBC, CMP, following up on Bld Cx to see if any sign of infection   Discussed with Daughter that I doubt AMS d/t stroke as it was tiny, focal, but if no evidence of ongoing UTI/ other infection, then encephalopathy would be d-t stroke  If so, I expect her to get back to baseline as stroke was so small    3) No further neurology input at this time; will follow as needed; please call back if any ?s or we need to revisit

## 2013-06-15 NOTE — Progress Notes (Signed)
Pt started grabbing at things (gown, nasal cannula, IV) , could not redirect pt. Pt would not respond to my questions, just continued to keep grabbing. Pt urin Notified Dr Verdie Mosher, who put in a order for Haldol IV and ordered a sitter if needed. Family is hesitant for pt to get this medication. They wanted to give her some time to see if they can calm her down and if not they agreed for her to get the medication.

## 2013-06-15 NOTE — Progress Notes (Signed)
Bedside shift change report given to Kevin (oncoming nurse) by Tayte Mcwherter (offgoing nurse). Report included the following information SBAR.

## 2013-06-16 LAB — CBC WITH AUTOMATED DIFF
ABS. BASOPHILS: 0 10*3/uL (ref 0.0–0.1)
ABS. EOSINOPHILS: 0 10*3/uL (ref 0.0–0.4)
ABS. LYMPHOCYTES: 3.8 10*3/uL — ABNORMAL HIGH (ref 0.8–3.5)
ABS. MONOCYTES: 1.5 10*3/uL — ABNORMAL HIGH (ref 0.0–1.0)
ABS. NEUTROPHILS: 8.6 10*3/uL — ABNORMAL HIGH (ref 1.8–8.0)
BASOPHILS: 0 % (ref 0–1)
EOSINOPHILS: 0 % (ref 0–7)
HCT: 31.6 % — ABNORMAL LOW (ref 35.0–47.0)
HGB: 10.6 g/dL — ABNORMAL LOW (ref 11.5–16.0)
LYMPHOCYTES: 27 % (ref 12–49)
MCH: 26.6 PG (ref 26.0–34.0)
MCHC: 33.5 g/dL (ref 30.0–36.5)
MCV: 79.2 FL — ABNORMAL LOW (ref 80.0–99.0)
MONOCYTES: 11 % (ref 5–13)
NEUTROPHILS: 62 % (ref 32–75)
PLATELET: 252 10*3/uL (ref 150–400)
RBC: 3.99 M/uL (ref 3.80–5.20)
RDW: 14.5 % (ref 11.5–14.5)
WBC: 13.9 10*3/uL — ABNORMAL HIGH (ref 3.6–11.0)

## 2013-06-16 LAB — EKG, 12 LEAD, INITIAL
Atrial Rate: 101 {beats}/min
Calculated P Axis: 51 degrees
Calculated R Axis: 36 degrees
Calculated T Axis: 7 degrees
P-R Interval: 140 ms
Q-T Interval: 366 ms
QRS Duration: 84 ms
QTC Calculation (Bezet): 474 ms
Ventricular Rate: 101 {beats}/min

## 2013-06-16 LAB — METABOLIC PANEL, BASIC
Anion gap: 15 mmol/L (ref 5–15)
BUN/Creatinine ratio: 30 — ABNORMAL HIGH (ref 12–20)
BUN: 19 MG/DL (ref 6–20)
CO2: 20 mmol/L — ABNORMAL LOW (ref 21–32)
Calcium: 9.6 MG/DL (ref 8.5–10.1)
Chloride: 104 mmol/L (ref 97–108)
Creatinine: 0.63 MG/DL (ref 0.45–1.15)
GFR est AA: >60 mL/min/{1.73_m2} (ref >60)
GFR est non-AA: >60 mL/min/{1.73_m2} (ref >60)
Glucose: 119 mg/dL — ABNORMAL HIGH (ref 65–100)
Potassium: 3.3 mmol/L — ABNORMAL LOW (ref 3.5–5.1)
Sodium: 139 mmol/L (ref 136–145)

## 2013-06-16 LAB — RETICULOCYTE COUNT: Reticulocyte count: 0.9 % (ref 0.7–2.1)

## 2013-06-16 LAB — GLUCOSE, POC
Glucose (POC): 142 mg/dL — ABNORMAL HIGH (ref 65–105)
Glucose (POC): 112 mg/dL — ABNORMAL HIGH (ref 65–105)
Glucose (POC): 111 mg/dL — ABNORMAL HIGH (ref 65–105)
Glucose (POC): 106 mg/dL — ABNORMAL HIGH (ref 65–105)

## 2013-06-16 LAB — IRON PROFILE
Iron % saturation: 17 % — ABNORMAL LOW (ref 20–50)
Iron: 40 ug/dL (ref 35–150)
TIBC: 232 ug/dL — ABNORMAL LOW (ref 250–450)

## 2013-06-16 LAB — VITAMIN B12: Vitamin B12: 1714 pg/mL — ABNORMAL HIGH (ref 211–911)

## 2013-06-16 LAB — HAPTOGLOBIN: Haptoglobin: 213 mg/dL — ABNORMAL HIGH (ref 30–200)

## 2013-06-16 LAB — LD: LD: 188 U/L (ref 81–246)

## 2013-06-16 LAB — VDRL, CSF: VDRL, CSF: NONREACTIVE

## 2013-06-16 LAB — FOLATE: Folate: 37.9 ng/mL — ABNORMAL HIGH (ref 5.0–21.0)

## 2013-06-16 LAB — FERRITIN: Ferritin: 152 NG/ML (ref 8–252)

## 2013-06-16 LAB — MAGNESIUM: Magnesium: 2.1 mg/dL (ref 1.6–2.4)

## 2013-06-16 MED ADMIN — sodium chloride (NS) flush 5-10 mL: INTRAVENOUS | @ 09:00:00 | NDC 87701099893

## 2013-06-16 MED ADMIN — potassium chloride 10 mEq in 100 ml IVPB: INTRAVENOUS | @ 17:00:00 | NDC 00409707426

## 2013-06-16 MED ADMIN — enoxaparin (LOVENOX) injection 40 mg: SUBCUTANEOUS | @ 16:00:00 | NDC 00075801401

## 2013-06-16 MED ADMIN — potassium chloride 10 mEq in 100 ml IVPB: INTRAVENOUS | @ 20:00:00 | NDC 00409707426

## 2013-06-16 MED ADMIN — ceftriaxone (ROCEPHIN) 1 g in 0.9% sodium chloride (MBP/ADV) 50 mL MBP: INTRAVENOUS | @ 13:00:00 | NDC 00409733201

## 2013-06-16 MED ADMIN — 0.9% sodium chloride infusion: INTRAVENOUS | @ 09:00:00 | NDC 00409798309

## 2013-06-16 MED ADMIN — sodium chloride (NS) flush 5-10 mL: INTRAVENOUS | @ 18:00:00 | NDC 87701099893

## 2013-06-16 MED ADMIN — sodium chloride (NS) flush 5-10 mL: INTRAVENOUS | @ 01:00:00 | NDC 87701099893

## 2013-06-16 MED ADMIN — sodium chloride (NS) flush 5-10 mL: INTRAVENOUS | @ 02:00:00 | NDC 87701099893

## 2013-06-16 MED ADMIN — magnesium sulfate 2 g/50 ml IVPB (premix or compounded): INTRAVENOUS | @ 01:00:00 | NDC 00409672924

## 2013-06-16 NOTE — Procedures (Signed)
[facility]  *** FINAL REPORT ***    Name: Chelsea Schultz, Chelsea Schultz  MRN: [id number]  DOB: June 15, 1926  HIS Order #: [id number]  TRAKnet Visit #: [id number]  Date: 14 Jun 2013    TYPE OF TEST: Cerebrovascular Duplex    REASON FOR TEST  Cerebrovascular accident (right hemisphere), Alteration in awareness,  Acute CVA- Right Frontal Periventricular region    Right Carotid:-             Proximal               Mid                 Distal  cm/s  Systolic  Diastolic  Systolic  Diastolic  Systolic  Diastolic  CCA:     08.6      16.8                            85.9      17.1  Bulb:  ECA:    132.8      14.0  ICA:    146.0      32.6      142.5      32.6  ICA/CCA:  1.7       1.9    ICA Stenosis:    Right Vertebral:-  Finding: Antegrade  Sys:       88.5  Dia:       19.6    Right Subclavian:    Left Carotid:-            Proximal                Mid                 Distal  cm/s  Systolic  Diastolic  Systolic  Diastolic  Systolic  Diastolic  CCA:    100.0      14.0                            76.5      12.5  Bulb:  ECA:    114.0      10.9  ICA:    114.0      15.6       96.8      25.0      114.0      34.3  ICA/CCA:  1.5       1.2    ICA Stenosis:    Left Vertebral:-  Finding: Antegrade  Sys:       59.0  Dia:       14.0    Left Subclavian:    INTERPRETATION/FINDINGS  PROCEDURE:  Evaluation of the extracranial cerebrovascular arteries  with ultrasound (B-mode imaging, pulsed Doppler, color Doppler).  Includes the common carotid, internal carotid, external carotid, and  vertebral arteries.    FINDINGS: Heterogeneous plaque in the bulb and right ICA. Calcific  plaque in the bulb and left ICA.    IMPRESSION: Findings are consistent with 0-49% stenosis of the right  internal carotid and 0-49% stenosis of the left internal carotid.  Vertebrals are patent with antegrade flow. NOTE: Velocities may be  higher than obtain seondary to shadowing from calcific plaque.    ADDITIONAL COMMENTS    I have personally reviewed the data  relevant to the interpretation of  this  study.  TECHNOLOGIST: Valentina Lucks  Signed: 06/14/2013 03:43 PM    PHYSICIAN: Vernia Buff T. Sheliah Hatch, MD  Signed: 06/16/2013 09:40 AM

## 2013-06-16 NOTE — Progress Notes (Signed)
Bedside, Verbal and Written shift change report given to Toniann Fail, Charity fundraiser (oncoming nurse) by Nicki Guadalajara, RN (offgoing nurse). Report included the following information SBAR, Kardex, MAR, Recent Results and Cardiac Rhythm SR.

## 2013-06-16 NOTE — Progress Notes (Addendum)
Bedside transfer report given to Nicki Guadalajara RN (oncoming nurse) by Rachael Darby RN (offgoing nurse). Report included the following information SBAR, Kardex, MAR and Recent Results.

## 2013-06-16 NOTE — Progress Notes (Signed)
Problem: Dysphagia (Adult)  Goal: *Acute Goals and Plan of Care (Insert Text)  SWALLOWING GOALS INITIATED 06-16-13:  1) SLP to re-eval when more alert for diet tolerance by 06-18-13  SPEECH LANGUAGE PATHOLOGY BEDSIDE SWALLOW EVALUATION  Patient: Chelsea Schultz (79 y.o. female)  Date: 06/16/2013  Primary Diagnosis: Metabolic encephalopathy        Precautions: aspiration         ASSESSMENT :  Based on the objective data described below, the patient presents with inappropriate mental status for PO. Also oral holding of PO and weak/painful swallow. SLP will f/u this afternoon to determine if patient's mental status improved..    Patient will benefit from skilled intervention to address the above impairments.  Patient???s rehabilitation potential is considered to be Fair  Factors which may influence rehabilitation potential include:   [ ]             None noted  [X]             Mental ability/status  [X]             Medical condition  [ ]             Home/family situation and support systems  [ ]             Safety awareness  [ ]             Pain tolerance/management  [ ]             Other:        PLAN :  Recommendations and Planned Interventions:  NPO  Frequency/Duration: Patient will be followed by speech-language pathology 3 times a week to address goals.  Discharge Recommendations: Skilled Nursing Facility       SUBJECTIVE:   Patient's daughter present and reported no h/o dysphagia???.      OBJECTIVE:       Past Medical History   Diagnosis Date   ??? Diabetes     ??? Hypertension     ??? Neuropathy     ??? Spinal stenosis     ??? CVA (cerebral infarction)     ??? GERD (gastroesophageal reflux disease)     ??? Nausea & vomiting     ??? Arthritis     ??? Osteopetrosis       Past Surgical History   Procedure Laterality Date   ??? Hx orthopaedic           Lumbar laminectomy   ??? Hx orthopaedic           Lumbar fusion     Prior Level of Function/Home Situation:   Home Situation  Home Environment: Private residence  One/Two Story Residence: Two story,  live on 1st floor  Living Alone: No  Support Systems: Child(ren)  Patient Expects to be Discharged to:: Private residence  Current DME Used/Available at Home: Grab bars;Walker;Shower chair;Hospital bed  Diet prior to admission: diabetic  Current Diet:  N:PO   Cognitive and Communication Status:  Neurologic State: Drowsy  Orientation Level: Disoriented to time;Disoriented to situation;Disoriented to place  Cognition: Decreased command following;Poor safety awareness  Perception: Cues to attend left visual field;Cues to attend right visual field        Oral Assessment:  Oral Assessment  Labial: Left droop  Dentition: Natural  Oral Hygiene: MILDLY DRY  Lingual:  (oral-motor exam difficult to assess)  P.O. Trials:  Patient Position: upright in bed  Vocal quality prior to P.O.: No impairment  Consistency Presented: Ice chips;Thin liquid;Puree  How  Presented: SLP-fed/presented;Spoon;Straw   ORAL PHASE:   Bolus Acceptance: Impaired (slow response to spoon, straw)  Bolus Formation/Control: Impaired  Type of Impairment:  (oral holding of purees-removed with toothette)  Propulsion: Absent  Oral Residue: Greater than 50% of bolus  PHARYNGEAL PHASE:   Initiation of Swallow: Delayed (# of seconds)  Laryngeal Elevation: Weak (c/o pain wiht swalllow)                         Pain:  Pain Scale 1: Adult Nonverbal Pain Scale  Pain Intensity 1: 0     After treatment:   [ ]             Patient left in no apparent distress sitting up in chair  [X]             Patient left in no apparent distress in bed  [ ]             Call bell left within reach  [X]             Nursing notified  [ ]             Caregiver present  [ ]             Bed alarm activated      COMMUNICATION/EDUCATION:   The patient???s plan of care including recommendations, planned interventions, and recommended diet changes were discussed with: Registered Nurse.  [ ]             Posted safety precautions in patient's room.  [X]             Patient/family have participated as able in  goal setting and plan of care.  [X]             Patient/family agree to work toward stated goals and plan of care.  [ ]             Patient understands intent and goals of therapy, but is neutral about his/her participation.  [ ]             Patient is unable to participate in goal setting and plan of care.    Thank you for this referral.  Malachi Bonds, SLP  Time Calculation: 20 mins

## 2013-06-16 NOTE — Progress Notes (Signed)
Problem: Mobility Impaired (Adult and Pediatric)  Goal: *Acute Goals and Plan of Care (Insert Text)  Physical Therapy Goals  Initiated 06/15/2013; Revised 06/16/2013  1. Patient will move from supine to sit and sit to supine in bed with moderate assistance within 7 day(s).   2. Patient will transfer from sit to/from stand with minimal assistance using the least restrictive device within 7 day(s).  3. Patient will ambulate 10??? using rolling walker with minimal assistance within 7 days.  4. Patient will perform unsupported sitting x 10 min with supervision for improved trunk strength and balance to improve functional mobility within 7 days.    PHYSICAL THERAPY TREATMENT  Patient: Chelsea Schultz (79 y.o. female)  Date: 06/16/2013  Diagnosis: Metabolic encephalopathy Cerebral thrombosis with cerebral infarction       Precautions:  Falls      ASSESSMENT:  Chelsea Schultz presents with slowly improving functional mobility limited by generally decreased strength, impaired balance, perseveration and very poor attention, decreased command-following and limited functional mobility. Daughter reports pt ambulated household distances using rolling walker prior to admission. She follows ~25% verbal and tactile cues this date and is very easily distracted by visual and tactile stimuli. Pt sits edge of bed with minimal to moderate assistance and right lean that increases with time. She performs transfers with moderate assistance x 2 (hand hold assistance) and performs standing weight shifts before cued for gait. Pt unable to progress lower extremities to take steps this date.   Progression toward goals:  [ ]     Improving appropriately and progressing toward goals  [X]     Improving slowly and progressing toward goals  [ ]     Not making progress toward goals and plan of care will be adjusted       PLAN:  Patient continues to benefit from skilled intervention to address the above impairments.  Continue treatment per established plan of  care.  Discharge Recommendations:  Rehab  Further Equipment Recommendations for Discharge:  Defer to rehab       SUBJECTIVE:   Patient stated ???I'll give it a minute,??? when cued for bed mobility.      OBJECTIVE DATA SUMMARY:   Critical Behavior:  Neurologic State: Alert;Agitated  Orientation Level: Oriented to person;Other (Comment) (responded approp to person, non responsive to further questi)  Cognition: No command following;Impulsive  Safety/Judgement: Decreased awareness of environment  Functional Mobility Training:  Bed Mobility:  Rolling: Total assistance  Supine to Sit: Total assistance; manual assistance to maintain hand placement on railings; pt does not follow commands to assist with lower extremity placement.  Sit to Supine: Total assistance           Transfers:  Sit to Stand: Additional time;Assist X2;Moderate assistance;Safety concerns  Stand to Sit: Additional time;Assist X2;Moderate assistance;Verbal cues and tactile cues to initiate sitting.                             Balance:  Sitting: Impaired; pt sits edge of bed x 7 mins with constant support  Sitting - Static: Fair (occasional) to poor  Sitting - Dynamic: Fair (occasional) to poor  Standing: Impaired  Standing - Static: Poor  Standing - Dynamic : Poor  Ambulation/Gait Training:        Ambulation - Level of Assistance: Unable at this time (standing weight shifts; unable to take steps)  Pain:  Pain Scale 1: Adult Nonverbal Pain Scale  Pain Intensity 1: 0              Activity Tolerance:   No signs/symptoms of distress or pt complaints.  Please refer to the flowsheet for vital signs taken during this treatment.  After treatment:   [ ]     Patient left in no apparent distress sitting up in chair  [X]     Patient left in no apparent distress in bed; activity apron provided (RN aware)  [X]     Call bell left within reach  [X]     Nursing notified  [X]     Caregiver present  [X]     Bed alarm  activated      COMMUNICATION/COLLABORATION:   The patient???s plan of care was discussed with: Registered Nurse    Juliann Pares, PT, DPT   Time Calculation: 26 mins

## 2013-06-16 NOTE — Progress Notes (Signed)
Physical Therapy Note:    Chart reviewed, nursing consulted. Pt receiving nursing care to obtain IV access at this time; unable to participate with PT. Will continue to follow per POC.

## 2013-06-16 NOTE — Progress Notes (Addendum)
Cardiology Progress Note   [facility]      NAME:  Chelsea Schultz   DOB:   [birth year]   MRN:   [id number]     Assessment/Plan:   1. Acute CVA: swallow eval, supporitve care   2,  abnml EKG with + positive: Not ACS. The EKG and troponin are circumstantial to stroke which may be the cause of abnomality in these tesst. She may very well have significant CAD.  Continue Plavix. Add ASA when taking po. Now she is NPO.   3.  HTN:  Cont Norvasc, lopressor   4.  Dysplidemia: on Zocor     Cardiology Attending:    Patient personally seen and examined. All the elements of history and examination were personally performed. Assessment and plan was discussed and agree as written above.     - CAD evaluation 4-6 weeks from now once she is recovered from stroke.   - I will sign off as there is no active cardiac issues. Please call if ?s.  Niki Payment K. Hasna Stefanik, MD, [facility]           Subjective:   Chelsea Schultz is 79 yo with PMH of DM, HTN, CVA, admitted with second episode of CVA in right frontal Periventricular region. History obtained from daughter in the room as patient is somnolent. Per her daughter the patient does not have any cardiac history. No chest pain prior to the stroke. No SOB. The EKG from 8/22 shows sinus tach with anterior st changes of ischemia. Subsequent EKG does not demonstrate these changes.       Cardiac ROS: Patient denies any exertional chest pain, dyspnea, palpitations, syncope, orthopnea, edema or paroxysmal nocturnal dyspnea.  Agitated last evening requiring haldol.     Review of Systems: No nausea, indigestion, vomiting, pain, cough, sputum. No bleeding. Failed swallowing eval,  pocketing.           Objective:     BP 154/63   Pulse 103   Temp(Src) 99.2 ??F (37.3 ??C)   Resp 20   Ht 4\' 7"  (1.397 m)   Wt 52.164 kg (115 lb)   BMI 26.73 kg/m2   SpO2 100%   LMP 10/23/1978 O2 Flow Rate (L/min): 2 l/min O2 Device: Nasal cannula    Temp (24hrs), Avg:98.5 ??F (36.9 ??C), Min:97.7 ??F (36.5 ??C), Max:99.6 ??F  (37.6 ??C)           08/23 1900 - 08/25 0659  In: 800 [I.V.:800]  Out: -   TELE:  SR SVT 8/24    General: AAOx3 cooperative, no acute distress.  HEENT: Atraumatic. Pink and moist.  Anicteric sclerae.  Neck : Supple, no thyromegaly.   Lungs: CTA bilaterally. No wheezing/rhonchi/rales.  Heart: Regular rhythm, no murmur, no rubs, no gallops. No JVD. No carotid bruits.   Abdomen: Soft, non-distended, non-tender. + Bowel sounds. No bruits.  Extremities: Spontaneously moves L side. No edema, no clubbing, no cyanosis. No calf tenderness  Neurologic: Grossly intact.  Alert and oriented X 3.  No acute neurological distress.   Psych: Fair insight. Not anxious at present.         Care Plan discussed with:    Comments   Patient x    Family  x dtr   RN x    Care Manager                    Consultant:          Data Review:  No components found with this basename: ITNL,    Recent Labs      06/15/13   1220   CPK  912*   CKMB  6.6*   TROIQ  0.15*     Recent Labs      06/15/13   1220  06/14/13   0450   NA  133*  130*   K  3.5  3.8   CL  97  97   CO2  23  22   BUN  9  10   CREA  0.51  0.59   GLU  141*  168*   PHOS  2.4*   --    MG  1.6   --    ALB   --   3.1*   WBC  15.9*  17.9*   HGB  13.4  12.3   HCT  40.7  37.9   PLT  269  288     No results found for this basename: INR, PTP, APTT,  in the last 72 hours    Medications reviewed  Current Facility-Administered Medications   Medication Dose Route Frequency   ??? clopidogrel (PLAVIX) tablet 75 mg  75 mg Oral DAILY   ??? amlodipine (NORVASC) tablet 5 mg  5 mg Oral DAILY   ??? enoxaparin (LOVENOX) injection 40 mg  40 mg SubCUTAneous Q24H   ??? metoprolol (LOPRESSOR) tablet 25 mg  25 mg Oral BID   ??? [COMPLETED] magnesium sulfate 2 g/50 ml IVPB (premix or compounded)  2 g IntraVENous ONCE   ??? [COMPLETED] haloperidol lactate (HALDOL) injection 1 mg  1 mg IntraVENous ONCE   ??? hydralazine (APRESOLINE) 20 mg/mL injection 20 mg  20 mg IntraVENous Q6H PRN   ??? sodium chloride (NS) flush 5-10 mL  5-10 mL  IntraVENous Q8H   ??? sodium chloride (NS) flush 5-10 mL  5-10 mL IntraVENous PRN   ??? acetaminophen (TYLENOL) tablet 650 mg  650 mg Oral Q4H PRN   ??? acetaminophen (TYLENOL) tablet 650 mg  650 mg Oral Q4H PRN   ??? diphenhydrAMINE (BENADRYL) injection 12.5 mg  12.5 mg IntraVENous Q4H PRN   ??? ondansetron (ZOFRAN) injection 4 mg  4 mg IntraVENous Q8H PRN   ??? 0.9% sodium chloride infusion  100 mL/hr IntraVENous CONTINUOUS   ??? insulin lispro (HUMALOG) injection   SubCUTAneous Q6H   ??? glucose chewable tablet 16 g  4 tablet Oral PRN   ??? dextrose (D50W) injection syrg 12.5-25 g  12.5-25 g IntraVENous PRN   ??? glucagon (GLUCAGEN) injection 1 mg  1 mg IntraMUSCular PRN   ??? ceftriaxone (ROCEPHIN) 1 g in 0.9% sodium chloride (MBP/ADV) 50 mL MBP  1 g IntraVENous Q24H   ??? simvastatin (ZOCOR) tablet 20 mg  20 mg Oral QHS         Stephanie A. Vilma Prader, NP

## 2013-06-16 NOTE — Progress Notes (Signed)
2200: Dr. Karie Mainland, night on-call physician paged due to pt's increased agitation since 8pm and inability to rest. One time order for 1mg  IV Haldol written and given. Will continue to monitor.     0130: Pt continues to be confused and agitated after 1mg  IV Haldol. Daughter at bedside continues to calm and reorient patient. Dr. Karie Mainland on floor and verbal orders for 1mg  IV Haldol once received.     0309: Bedside and Verbal shift change report given to David Stall, RN (oncoming nurse) by Toniann Fail, RN (offgoing nurse). Report included the following information SBAR, Kardex, Intake/Output and MAR.

## 2013-06-16 NOTE — Procedures (Signed)
[facility]  *** FINAL REPORT ***    Name: Burstein, Claris  MRN: [id number]  DOB: 18 Apr 1926  HIS Order #: [id number]  TRAKnet Visit #: [id number]  Date: 14 Jun 2013    TYPE OF TEST: Cerebrovascular Duplex    REASON FOR TEST  Cerebrovascular accident (right hemisphere), Alteration in awareness,  Acute CVA- Right Frontal Periventricular region    Right Carotid:-             Proximal               Mid                 Distal  cm/s  Systolic  Diastolic  Systolic  Diastolic  Systolic  Diastolic  CCA:     97.4      16.8                            85.9      17.1  Bulb:  ECA:    132.8      14.0  ICA:    146.0      32.6      142.5      32.6  ICA/CCA:  1.7       1.9    ICA Stenosis:    Right Vertebral:-  Finding: Antegrade  Sys:       88.5  Dia:       19.6    Right Subclavian:    Left Carotid:-            Proximal                Mid                 Distal  cm/s  Systolic  Diastolic  Systolic  Diastolic  Systolic  Diastolic  CCA:    100.0      14.0                            76.5      12.5  Bulb:  ECA:    114.0      10.9  ICA:    114.0      15.6       96.8      25.0      114.0      34.3  ICA/CCA:  1.5       1.2    ICA Stenosis:    Left Vertebral:-  Finding: Antegrade  Sys:       59.0  Dia:       14.0    Left Subclavian:    INTERPRETATION/FINDINGS  PROCEDURE:  Evaluation of the extracranial cerebrovascular arteries  with ultrasound (B-mode imaging, pulsed Doppler, color Doppler).  Includes the common carotid, internal carotid, external carotid, and  vertebral arteries.    FINDINGS: Heterogeneous plaque in the bulb and right ICA. Calcific  plaque in the bulb and left ICA.    IMPRESSION: Findings are consistent with 0-49% stenosis of the right  internal carotid and 0-49% stenosis of the left internal carotid.  Vertebrals are patent with antegrade flow. NOTE: Velocities may be  higher than obtain seondary to shadowing from calcific plaque.    ADDITIONAL COMMENTS    I have personally reviewed the data  relevant to the interpretation of  this  study.      TECHNOLOGIST: Jill Lewis-Sunshine  Signed: 06/14/2013 03:43 PM    PHYSICIAN: Terris Germano T. Arion Morgan, MD  Signed: 06/16/2013 09:40 AM

## 2013-06-16 NOTE — Progress Notes (Signed)
Bedside shift change report given to Chante RN (oncoming nurse) by Ayesha RN (offgoing nurse). Report included the following information SBAR.

## 2013-06-16 NOTE — Progress Notes (Signed)
Problem: Dysphagia (Adult)  Goal: *Acute Goals and Plan of Care (Insert Text)  SWALLOWING GOALS INITIATED 06-16-13:  1) SLP to re-eval when more alert for diet tolerance by 06-18-13   SPEECH LANGUAGE PATHOLOGY DYSPHAGIA TREATMENT  Patient: Chelsea Schultz (79 y.o. female)  Date: 06/16/2013  Diagnosis: Metabolic encephalopathy Cerebral thrombosis with cerebral infarction       Precautions: aspiration         ASSESSMENT:  More alert, but too confused for PO. Holding drinks in mouth and choking. NOT ready for PO.  Progression toward goals:  [ ]          Improving appropriately and progressing toward goals  [X]          Improving slowly and progressing toward goals  [ ]          Not making progress toward goals and plan of care will be adjusted       PLAN:  Recommendations and Planned Interventions:  NPO  Patient continues to benefit from skilled intervention to address the above impairments.  Continue treatment per established plan of care.  Discharge Recommendations:  To Be Determined       SUBJECTIVE:   Patient nonverbal  ???.      OBJECTIVE:   Cognitive and Communication Status:  Neurologic State: Alert;Agitated  Orientation Level: Oriented to person;Other (Comment) (responded approp to person, non responsive to further questi)  Cognition: No command following;Impulsive  Perception: Cues to attend left visual field;Cues to attend right visual field     Safety/Judgement: Decreased awareness of environment  Dysphagia Treatment:  Oral Assessment:  Oral Assessment  Labial: Left droop  Dentition: Natural  Oral Hygiene: MILDLY DRY  Lingual:  (oral-motor exam difficult to assess)  P.O. Trials:  Patient Position: upright in bed  Vocal quality prior to P.O.: No impairment  Consistency Presented: Thin liquid  How Presented: SLP-fed/presented;Spoon   ORAL PHASE:   Bolus Acceptance: Impaired  Bolus Formation/Control: Impaired  Type of Impairment:  (severe oral holding)  Propulsion:  (eventually leaked out or spit after repetitive  swish)  Oral Residue: Greater than 50% of bolus  PHARYNGEAL PHASE:   Initiation of Swallow: Delayed (# of seconds)  Laryngeal Elevation: Weak  Aspiration Signs/Symptoms: Weak cough                                                                                                                                                         Pain:  Pain Scale 1: Adult Nonverbal Pain Scale  Pain Intensity 1: 0     After treatment:   [ ]               Patient left in no apparent distress sitting up in chair  [X]               Patient left  in no apparent distress in bed  [ ]               Call bell left within reach  [X]               Nursing notified  [X]               Caregiver present  [ ]               Bed alarm activated      COMMUNICATION/EDUCATION:   The patient???s plan of care including recommendations, planned interventions, and recommended diet changes were discussed with: Registered Nurse.  [ ]               Posted safety precautions in patient's room.    Malachi Bonds, SLP  Time Calculation: 10 mins

## 2013-06-16 NOTE — Progress Notes (Addendum)
[facility]  [address] [place], [place] [address]  [phone]      Medical Progress Note      NAME: Chelsea Schultz   DOB:  08/17/1926  MRM:  [id number]    Date/Time: 06/16/2013  10:44 AM         Subjective:     Chief Complaint:  F/u AMS, CVA    Mentating better. Recognized her daughter and had a coherent conversation with her this morning. This morning, was alert and oriented to her self and place (hospital). Denied HA, CP, SOB, abd pain.     ROS:  (bold if positive, if negative)                               Objective:       Vitals:          Last 24hrs VS reviewed since prior progress note. Most recent are:    Visit Vitals   Item Reading   ??? BP 131/43   ??? Pulse 84   ??? Temp 98.4 ??F (36.9 ??C)   ??? Resp 20   ??? Ht 4\' 7"  (1.397 m)   ??? Wt 52.164 kg (115 lb)   ??? BMI 26.73 kg/m2   ??? SpO2 96%     SpO2 Readings from Last 6 Encounters:   06/16/13 96%   04/03/12 97%   07/01/10 98%    O2 Flow Rate (L/min): 2 l/min       Intake/Output Summary (Last 24 hours) at 06/16/13 1043  Last data filed at 06/16/13 0457   Gross per 24 hour   Intake    800 ml   Output      0 ml   Net    800 ml          Exam:     Physical Exam:    Gen:  Well-developed, well-nourished, in no acute distress.   HEENT:  Sclerae nonicteric, hearing intact to voice, mucous membranes moist  Neck:  Supple, without masses.  Resp:  No accessory muscle use, CTAB without wheezes, rales, or rhonchi  Card: RRR, without m/r/g. No LE edema.   Abd:  +bowel sounds, soft, NTTP, nondistended. No HSM.   Neuro: some mild LLE weakness otherwise no focal deficits noted. Following commands  Psych:  Awake, Alert, oriented to self and place     Medications Reviewed: (see below)    Lab Data Reviewed: (see below)    ______________________________________________________________________    Medications:     Current Facility-Administered Medications   Medication Dose Route Frequency   ??? clopidogrel (PLAVIX) tablet 75 mg  75 mg Oral DAILY   ??? amlodipine  (NORVASC) tablet 5 mg  5 mg Oral DAILY   ??? enoxaparin (LOVENOX) injection 40 mg  40 mg SubCUTAneous Q24H   ??? metoprolol (LOPRESSOR) tablet 25 mg  25 mg Oral BID   ??? [COMPLETED] magnesium sulfate 2 g/50 ml IVPB (premix or compounded)  2 g IntraVENous ONCE   ??? [COMPLETED] haloperidol lactate (HALDOL) injection 1 mg  1 mg IntraVENous ONCE   ??? hydralazine (APRESOLINE) 20 mg/mL injection 20 mg  20 mg IntraVENous Q6H PRN   ??? sodium chloride (NS) flush 5-10 mL  5-10 mL IntraVENous Q8H   ??? sodium chloride (NS) flush 5-10 mL  5-10 mL IntraVENous PRN   ??? acetaminophen (TYLENOL) tablet 650 mg  650 mg Oral Q4H PRN   ???  acetaminophen (TYLENOL) tablet 650 mg  650 mg Oral Q4H PRN   ??? diphenhydrAMINE (BENADRYL) injection 12.5 mg  12.5 mg IntraVENous Q4H PRN   ??? ondansetron (ZOFRAN) injection 4 mg  4 mg IntraVENous Q8H PRN   ??? 0.9% sodium chloride infusion  100 mL/hr IntraVENous CONTINUOUS   ??? insulin lispro (HUMALOG) injection   SubCUTAneous Q6H   ??? glucose chewable tablet 16 g  4 tablet Oral PRN   ??? dextrose (D50W) injection syrg 12.5-25 g  12.5-25 g IntraVENous PRN   ??? glucagon (GLUCAGEN) injection 1 mg  1 mg IntraMUSCular PRN   ??? ceftriaxone (ROCEPHIN) 1 g in 0.9% sodium chloride (MBP/ADV) 50 mL MBP  1 g IntraVENous Q24H   ??? simvastatin (ZOCOR) tablet 20 mg  20 mg Oral QHS            Lab Review:     Recent Labs      06/15/13   1220  06/14/13   0450   WBC  15.9*  17.9*   HGB  13.4  12.3   HCT  40.7  37.9   PLT  269  288     Recent Labs      06/15/13   1220  06/14/13   0450   NA  133*  130*   K  3.5  3.8   CL  97  97   CO2  23  22   GLU  141*  168*   BUN  9  10   CREA  0.51  0.59   CA  9.9  9.4   MG  1.6   --    PHOS  2.4*   --    ALB   --   3.1*   SGOT   --   33   ALT   --   20     No components found with this basename: glpoc     No results found for this basename: PH, PCO2, PO2, HCO3, FIO2,  in the last 72 hours  No results found for this basename: INR,  in the last 72 hours  Lab Results   Component Value Date/Time    Specimen  Description: URINE 04/02/2012  8:00 PM     Lab Results   Component Value Date/Time    Culture result: NO GROWTH AFTER 13 HOURS 06/14/2013  7:13 PM    Culture result: NO GROWTH 3 DAYS 06/13/2013  5:20 AM    Culture result: ESCHERICHIA COLI 04/02/2012  8:00 PM            Assessment / Plan:       Acute CVA: MRI brain showed tiny acute infarct in the right frontal periventricular region and extensive deep white matter signal abnormality and chronic right pontine lacunar infarct consistent with intracranial microangiopathy. Carotid dopplers no significant stenosis. TTE unremarkable  -- Cont Plavix and simvastatin  -- PT/OT, speech eval. May need SNF      Encephalopathy (06/13/2013): likely metabolic/multifactorial. With tiny acute CVA noted and E. Coli UTI POA (reviewed culture data, pan-sensitive). LP w/o e/o infection. HSV PCR negative  -- repeat BMP, CBC (showed improving hyponatremia and leukocytosis yesterday)  -- Treating UTI as below  -- f/u CSF VDRL although low suspicion for neurosyphilis      Elevated cardiac enzymes: Mild bump in tropI and CK/CKMB in setting of CVA. EKG changes on admission resolved when repeated yesterday. Evaluated by cards, unlikely ACS  -- Cont Plavix for CVA, statin  UTI: POA, near-pan-sensitive E. Coli (except Macrobid). Was treated w/ macrobid prior to admission. UA on admission w/o pyuria  -- cont CTX for now, plan for 5-7 day course, started on 8/23      HTN (hypertension), benign (06/13/2013): SBP 150s - 160s this morning, on amlodpine 5mg  and lasix 40mg  and Diovan 320mg  at home. Permissive hypertension for 24 hours  -- restart amlodipine and Diovan  -- IV hydralazine prn      DM type 2 (diabetes mellitus, type 2) (06/13/2013): on on Januvia and metformin at home. A1C 6.5  -- SSI for now    Surrogate decision maker: daughter Lynden Ang    Total time spent with patient: 35 minutes                  Care Plan discussed with: Patient, Family and Nursing Staff    Discussed:  Care Plan     Prophylaxis:  Lovenox    Disposition:  Home w/Family vs SNF/LTC           ___________________________________________________    Attending Physician: Jamie Brookes, MD

## 2013-06-16 NOTE — Wound Image (Signed)
Wound care: consult to assess skin for redness. Alert and oriented, in no distress. Denies pain, on an accumax surface.    Assessment  All skin folds and bony prominences assessed, turned with staff assistance.  Sacral area intact, area of darker discoloration to the sacral and peri rectal area, appears to be chronic. Heels and elbows intact, no redness.    Treatment  Repositioned in bed, heels are floated.    Recommendations/plan  Turn, reposition every 2 hours, float heels. Mobility team.  Incontinent, perineal care with comfort shields, dimethicone cream.    Reconsult as needed.    Eliot Ford RN CWCN

## 2013-06-17 LAB — CBC WITH AUTOMATED DIFF
ABS. BASOPHILS: 0 10*3/uL (ref 0.0–0.1)
ABS. EOSINOPHILS: 0.5 10*3/uL — ABNORMAL HIGH (ref 0.0–0.4)
ABS. LYMPHOCYTES: 1.1 10*3/uL (ref 0.8–3.5)
ABS. MONOCYTES: 1.1 10*3/uL — ABNORMAL HIGH (ref 0.0–1.0)
ABS. NEUTROPHILS: 9.6 10*3/uL — ABNORMAL HIGH (ref 1.8–8.0)
BASOPHILS: 0 % (ref 0–1)
EOSINOPHILS: 4 % (ref 0–7)
HCT: 30.7 % — ABNORMAL LOW (ref 35.0–47.0)
HGB: 10.3 g/dL — ABNORMAL LOW (ref 11.5–16.0)
LYMPHOCYTES: 9 % — ABNORMAL LOW (ref 12–49)
MCH: 26.5 PG (ref 26.0–34.0)
MCHC: 33.6 g/dL (ref 30.0–36.5)
MCV: 79.1 FL — ABNORMAL LOW (ref 80.0–99.0)
MONOCYTES: 9 % (ref 5–13)
NEUTROPHILS: 78 % — ABNORMAL HIGH (ref 32–75)
PLATELET: 251 10*3/uL (ref 150–400)
RBC: 3.88 M/uL (ref 3.80–5.20)
RDW: 14.8 % — ABNORMAL HIGH (ref 11.5–14.5)
WBC: 12.3 10*3/uL — ABNORMAL HIGH (ref 3.6–11.0)

## 2013-06-17 LAB — METABOLIC PANEL, BASIC
Anion gap: 16 mmol/L — ABNORMAL HIGH (ref 5–15)
BUN/Creatinine ratio: 25 — ABNORMAL HIGH (ref 12–20)
BUN: 15 MG/DL (ref 6–20)
CO2: 20 mmol/L — ABNORMAL LOW (ref 21–32)
Calcium: 9.4 MG/DL (ref 8.5–10.1)
Chloride: 105 mmol/L (ref 97–108)
Creatinine: 0.59 MG/DL (ref 0.45–1.15)
GFR est AA: >60 mL/min/{1.73_m2} (ref >60)
GFR est non-AA: >60 mL/min/{1.73_m2} (ref >60)
Glucose: 79 mg/dL (ref 65–100)
Potassium: 3.3 mmol/L — ABNORMAL LOW (ref 3.5–5.1)
Sodium: 141 mmol/L (ref 136–145)

## 2013-06-17 LAB — GLUCOSE, POC
Glucose (POC): 87 mg/dL (ref 65–105)
Glucose (POC): 158 mg/dL — ABNORMAL HIGH (ref 65–105)
Glucose (POC): 77 mg/dL (ref 65–105)
Glucose (POC): 103 mg/dL (ref 65–105)
Glucose (POC): 253 mg/dL — ABNORMAL HIGH (ref 65–105)

## 2013-06-17 LAB — MAGNESIUM: Magnesium: 1.7 mg/dL (ref 1.6–2.4)

## 2013-06-17 LAB — TROPONIN I
Troponin-I, Qt.: 0.12 ng/mL — ABNORMAL HIGH (ref <0.05)
Troponin-I, Qt.: 0.11 ng/mL — ABNORMAL HIGH (ref <0.05)

## 2013-06-17 LAB — CK: CK: 336 U/L — ABNORMAL HIGH (ref 26–192)

## 2013-06-17 MED ADMIN — 0.9% sodium chloride infusion: INTRAVENOUS | @ 02:00:00 | NDC 00338004904

## 2013-06-17 MED ADMIN — potassium chloride 10 mEq in 100 ml IVPB: INTRAVENOUS | @ 20:00:00 | NDC 00409707426

## 2013-06-17 MED ADMIN — hydralazine (APRESOLINE) 20 mg/mL injection 20 mg: INTRAVENOUS | @ 13:00:00 | NDC 17478093401

## 2013-06-17 MED ADMIN — ceftriaxone (ROCEPHIN) 1 g in 0.9% sodium chloride (MBP/ADV) 50 mL MBP: INTRAVENOUS | @ 13:00:00 | NDC 00409733201

## 2013-06-17 MED ADMIN — enoxaparin (LOVENOX) injection 40 mg: SUBCUTANEOUS | @ 15:00:00 | NDC 00075801401

## 2013-06-17 MED ADMIN — haloperidol lactate (HALDOL) injection 1 mg: INTRAVENOUS | @ 06:00:00 | NDC 10147091101

## 2013-06-17 MED ADMIN — insulin lispro (HUMALOG) injection: SUBCUTANEOUS | @ 22:00:00 | NDC 99999192301

## 2013-06-17 MED ADMIN — sodium chloride (NS) flush 5-10 mL: INTRAVENOUS | @ 18:00:00 | NDC 87701099893

## 2013-06-17 MED ADMIN — metoprolol (LOPRESSOR) tablet 25 mg: ORAL | @ 22:00:00 | NDC 50742010705

## 2013-06-17 MED ADMIN — 0.9% sodium chloride infusion: INTRAVENOUS | @ 16:00:00 | NDC 00338004904

## 2013-06-17 MED ADMIN — metoprolol (LOPRESSOR) injection 1.25 mg: INTRAVENOUS | @ 16:00:00 | NDC 25021030305

## 2013-06-17 MED ADMIN — potassium chloride 10 mEq in 100 ml IVPB: INTRAVENOUS | @ 15:00:00 | NDC 00409707426

## 2013-06-17 MED ADMIN — haloperidol lactate (HALDOL) injection 1 mg: INTRAVENOUS | @ 03:00:00 | NDC 63323047401

## 2013-06-17 MED ADMIN — sodium chloride (NS) flush 5-10 mL: INTRAVENOUS | @ 02:00:00 | NDC 87701099893

## 2013-06-17 MED ADMIN — potassium chloride 10 mEq in 100 ml IVPB: INTRAVENOUS | @ 18:00:00 | NDC 00409707426

## 2013-06-17 MED ADMIN — dextrose (D50W) injection syrg 12.5-25 g: INTRAVENOUS | @ 11:00:00 | NDC 76329330101

## 2013-06-17 MED ADMIN — sodium chloride (NS) flush 5-10 mL: INTRAVENOUS | @ 10:00:00 | NDC 87701099893

## 2013-06-17 MED ADMIN — potassium chloride 10 mEq in 100 ml IVPB: INTRAVENOUS | @ 17:00:00 | NDC 00409707426

## 2013-06-17 NOTE — Progress Notes (Signed)
Problem: Dysphagia (Adult)  Goal: *Acute Goals and Plan of Care (Insert Text)  SWALLOWING GOALS INITIATED 06-16-13:  1) SLP to re-eval when more alert for diet tolerance by 06-18-13   SPEECH LANGUAGE PATHOLOGY DYSPHAGIA TREATMENT  Patient: Chelsea Schultz (79 y.o. female)  Date: 06/17/2013  Diagnosis: Metabolic encephalopathy Cerebral thrombosis with cerebral infarction       Precautions: fall         ASSESSMENT:  Patient alert, but continues with confusion. Improved oral swallow today, now effectively taking and managing puree and liquid consistencies. She was unable to manipulate a cracker and it fell out of her mouth. Pharyngeal swallow continues with mild delay and suspected weakness. Double swallow for all thin liquid trials and cough x all thin liquid trials. Patient's family member present and receptive to teaching. Discussed safe swallowing strategies and s/s of aspiration. Family verbalized understanding.  Progression toward goals:  [ ]          Improving appropriately and progressing toward goals  [X]          Improving slowly and progressing toward goals  [ ]          Not making progress toward goals and plan of care will be adjusted       PLAN:  Recommendations and Planned Interventions:  Ok to initiate dysphagia 1 diet, nectars  Feed only when awake, alert, and reasonably calm  Upright for all PO  Check for oral residue after PO  Discontinue feeding if s/s of aspiration are appreciated.   Patient continues to benefit from skilled intervention to address the above impairments.  Continue treatment per established plan of care.  Discharge Recommendations:  To Be Determined       SUBJECTIVE:   Patient stated ???I don't feel like I did yesterday???.      OBJECTIVE:   Cognitive and Communication Status:  Neurologic State: Alert;Confused  Orientation Level: Oriented to person  Cognition: Decreased attention/concentration;Decreased command following;Impaired decision making  Perception: Cues to attend left visual  field;Cues to attend right visual field  Perseveration: Perseverates during ADLS;Perseverates during mobility  Safety/Judgement: Decreased awareness of environment  Dysphagia Treatment:  Oral Assessment:  Oral Assessment  Oral Hygiene: dry  P.O. Trials:  Patient Position: upright in bed  Vocal quality prior to P.O.: No impairment  Consistency Presented: Ice chips;Thin liquid;Nectar thick liquid;Puree;Solid  How Presented: Self-fed/presented;SLP-fed/presented;Spoon;Straw;Cup/sip     Bolus Acceptance: No impairment  Bolus Formation/Control: Impaired  Type of Impairment:  (solids falling out of her mouth)     Oral Residue: Greater than 50% of bolus (for solids)  Initiation of Swallow: Delayed (# of seconds)  Laryngeal Elevation: Decreased  Aspiration Signs/Symptoms: Clear throat;Strong cough (with thins only)  Pharyngeal Phase Characteristics:  (delay and mild weakness)             Oral Phase Severity: Mild-moderate  Pharyngeal Phase Severity : Mild  Pain:  Pain Scale 1: Numeric (0 - 10)  Pain Intensity 1: 0     After treatment:   [ ]               Patient left in no apparent distress sitting up in chair  [X]               Patient left in no apparent distress in bed  [X]               Call bell left within reach  [X]               Nursing notified  [X]               Caregiver present  [ ]               Bed alarm activated      COMMUNICATION/EDUCATION:   The patient???s plan of care including recommendations, planned interventions, and recommended diet changes were discussed with: Registered Nurse and MD.  [ ]               Posted safety precautions in patient's room.    Mirian Capuchin, SLP  Time Calculation: 17 mins

## 2013-06-17 NOTE — Progress Notes (Signed)
Physical Therapy Note:    Chart reviewed, nursing consulted. Daughter reporting pt not able to tolerate PT treatment this date stating pt has not slept in 28 hours and is following commands very minimally. Discussed with RN. Will continue to follow per POC.

## 2013-06-17 NOTE — Progress Notes (Addendum)
Pt BP readings 180/57 and 171/73. P 110. IV hydralazine administered. Will continue to monitor.    1130 BP recheck  134/64.

## 2013-06-17 NOTE — Progress Notes (Signed)
Bedside and Verbal shift change report given to Hansel Starling, Charity fundraiser (Cabin crew) by Starling Manns, RN (offgoing nurse). Report included the following information SBAR, Kardex, MAR, Recent Results, Med Rec Status and Cardiac Rhythm ST.

## 2013-06-17 NOTE — Progress Notes (Signed)
Bedside shift change report given to Caleb Popp RN (oncoming nurse) by Gwenlyn Perking., RN (offgoing nurse). Report included the following information Procedure Summary, Intake/Output, MAR, Accordion, Recent Results and Cardiac Rhythm  Sinus Tachycardia.

## 2013-06-17 NOTE — Progress Notes (Addendum)
Orders noted for EKG.    1720 EKG obtained.   1725 MD notified.

## 2013-06-17 NOTE — Progress Notes (Signed)
1610:  Pt's blood sugar was 77. Pt did not exhibit signs and symptoms of hypoglycemia. 1/2 amp of D50W was given peripherally, as pt is NPO. Recheck glucose 15 minutes after and it was 158. Continue to monitor.

## 2013-06-17 NOTE — Progress Notes (Addendum)
[facility]  [address] [place], [place] [address]  [phone]      Medical Progress Note      NAME: Chelsea Schultz   DOB:  1926-07-13  MRM:  [id number]    Date/Time: 06/17/2013  11:24 AM       Assessment / Plan:     Acute CVA:  MRI brain with tiny acute infarct in the right frontal periventricular region.  Carotid dopplers without significant stenosis.  Not safe for PO per speech therapy.  Has not been able to take meds.  Discussed with daughter today.    -- may need tube feeds, but will speak with speech therapy today   -- PT / OT     Encephalopathy (06/13/2013):  Due to CVA, UTI.  LP was negative for infection.  EEG reportedly without epileptiform activity.    -- supportive care     Elevated cardiac enzymes:  Evaluated by cards on admission and no further evaluation recommended at this time.   -- repeat troponin to ensure stability    UTI:   -- continue ceftriaxone    HTN:  BP labile.  However, has not been taking PO meds.   -- start IV metoprolol   -- hydralazine prn    DM type 2 (diabetes mellitus, type 2) (06/13/2013):  A1c 6.5 this admission.   -- SSI alone for now    Insomnia:  Ativan caused excess sedation.   -- benadryl prn    **5:29 PM  EKG repeated due to tachycardia.  Sinus tachycardia noted.  Has slight lateral st depression.  Likely rate related.  Troponin repeated earlier was improved at 0.12.  Patient seen by cards earlier this admission.  Will restart metoprolol PO tonight as has been cleared for PO.  Titrate as tolerated.  Will follow troponin trend overnight.         Subjective:     Chief Complaint:  "i'm fine"    Reports feeling well.  No cp or sob.  No abd pain, n/v.  Daughter reports that patient did not sleep at all last night.    ROS:  (bold if positive, if negative)              Objective:       Vitals:          Last 24hrs VS reviewed since prior progress note. Most recent are:    Visit Vitals   Item Reading   ??? BP 171/73   ??? Pulse 110   ??? Temp 97.9 ??F  (36.6 ??C)   ??? Resp 20   ??? Ht 4\' 7"  (1.397 m)   ??? Wt 52.164 kg (115 lb)   ??? BMI 26.73 kg/m2   ??? SpO2 98%     SpO2 Readings from Last 6 Encounters:   06/17/13 98%   04/03/12 97%   07/01/10 98%    O2 Flow Rate (L/min): 2 l/min     Intake/Output Summary (Last 24 hours) at 06/17/13 1124  Last data filed at 06/17/13 0529   Gross per 24 hour   Intake   1695 ml   Output      0 ml   Net   1695 ml          Exam:     Physical Exam:    Gen:  Well-developed, well-nourished, in no acute distress  HEENT:  Pink conjunctivae, hearing intact to voice, moist mucous membranes  Neck:  Supple,  without masses  Resp:  No accessory muscle use, clear breath sounds without wheezes rales or rhonchi  Card:  No murmurs, normal S1, S2 without thrills or peripheral edema  Abd:  Soft, non-tender, non-distended, normoactive bowel sounds are present  Musc:  No cyanosis  Skin:  No rashes or ulcers, skin turgor is good  Neuro:  Face symmetric, tongue midline, speech fluent, grip strength is 5/5 bilaterally and dorsi / plantarflexion is 5/5 bilaterally, follows commands appropriately  Psych:  Poor insight, oriented to person and place, alert       Telemetry reviewed:   sinus arrhythmia    Medications Reviewed: (see below)    Lab Data Reviewed: (see below)    ______________________________________________________________________    Medications:     Current Facility-Administered Medications   Medication Dose Route Frequency   ??? [COMPLETED] haloperidol lactate (HALDOL) injection 1 mg  1 mg IntraVENous ONCE   ??? potassium chloride 10 mEq in 100 ml IVPB  10 mEq IntraVENous Q1H   ??? diphenhydrAMINE (BENADRYL) injection 12.5 mg  12.5 mg IntraVENous QHS PRN   ??? valsartan (DIOVAN) tablet 320 mg  320 mg Oral DAILY   ??? [COMPLETED] potassium chloride 10 mEq in 100 ml IVPB  10 mEq IntraVENous Q1H   ??? [COMPLETED] haloperidol lactate (HALDOL) injection 1 mg  1 mg IntraVENous ONCE   ??? clopidogrel (PLAVIX) tablet 75 mg  75 mg Oral DAILY   ??? amlodipine (NORVASC) tablet 5  mg  5 mg Oral DAILY   ??? enoxaparin (LOVENOX) injection 40 mg  40 mg SubCUTAneous Q24H   ??? metoprolol (LOPRESSOR) tablet 25 mg  25 mg Oral BID   ??? hydralazine (APRESOLINE) 20 mg/mL injection 20 mg  20 mg IntraVENous Q6H PRN   ??? sodium chloride (NS) flush 5-10 mL  5-10 mL IntraVENous Q8H   ??? sodium chloride (NS) flush 5-10 mL  5-10 mL IntraVENous PRN   ??? acetaminophen (TYLENOL) tablet 650 mg  650 mg Oral Q4H PRN   ??? acetaminophen (TYLENOL) tablet 650 mg  650 mg Oral Q4H PRN   ??? diphenhydrAMINE (BENADRYL) injection 12.5 mg  12.5 mg IntraVENous Q4H PRN   ??? ondansetron (ZOFRAN) injection 4 mg  4 mg IntraVENous Q8H PRN   ??? 0.9% sodium chloride infusion  100 mL/hr IntraVENous CONTINUOUS   ??? insulin lispro (HUMALOG) injection   SubCUTAneous Q6H   ??? glucose chewable tablet 16 g  4 tablet Oral PRN   ??? dextrose (D50W) injection syrg 12.5-25 g  12.5-25 g IntraVENous PRN   ??? glucagon (GLUCAGEN) injection 1 mg  1 mg IntraMUSCular PRN   ??? ceftriaxone (ROCEPHIN) 1 g in 0.9% sodium chloride (MBP/ADV) 50 mL MBP  1 g IntraVENous Q24H   ??? simvastatin (ZOCOR) tablet 20 mg  20 mg Oral QHS            Lab Review:     Recent Labs      06/17/13   0330  06/16/13   1115  06/15/13   1220   WBC  12.3*  13.9*  15.9*   HGB  10.3*  10.6*  13.4   HCT  30.7*  31.6*  40.7   PLT  251  252  269     Recent Labs      06/17/13   0330  06/16/13   1115  06/15/13   1220   NA  141  139  133*   K  3.3*  3.3*  3.5   CL  105  104  97   CO2  20*  20*  23   GLU  79  119*  141*   BUN  15  19  9    CREA  0.59  0.63  0.51   CA  9.4  9.6  9.9   MG  1.7  2.1  1.6   PHOS   --    --   2.4*     Lab Results   Component Value Date/Time    Glucose (POC) 158 06/17/2013  6:46 AM    Glucose (POC) 77 06/17/2013  6:24 AM    Glucose (POC) 87 06/16/2013 11:48 PM    Glucose (POC) 106 06/16/2013  5:47 PM    Glucose (POC) 111 06/16/2013 11:48 AM    Glucose POC 137 03/10/2011  9:27 AM    Glucose POC 125 07/01/2010  5:10 PM         Total time spent with patient: 25 Minutes                  Care  Plan discussed with: Patient and Family (daughter at bedside)    Discussed:  Care Plan    Prophylaxis:  Lovenox    Disposition:  SNF/LTC           ___________________________________________________    Attending Physician: Suzan Garibaldi, MD

## 2013-06-17 NOTE — Progress Notes (Addendum)
Family member (daughter) present at bedside and is requesting to speak with the MD in regards to pt. RN informed daughter that the MD will round on the pt, and discussed that we just do not have an exact time as to when he will come and see the pt. Daughter requesting to have MD paged. RN paged MD, awaiting telephone call back.    MD notified of family request.

## 2013-06-17 NOTE — Progress Notes (Signed)
Pt now on dysphagia 1 diet, RN fed pt and pt tolerated feeding well. Will continue to monitor pt status.

## 2013-06-18 LAB — EKG, 12 LEAD, INITIAL
Atrial Rate: 136 {beats}/min
Calculated P Axis: 39 degrees
Calculated R Axis: 12 degrees
Calculated T Axis: 33 degrees
P-R Interval: 122 ms
Q-T Interval: 320 ms
QRS Duration: 68 ms
QTC Calculation (Bezet): 481 ms
Ventricular Rate: 136 {beats}/min

## 2013-06-18 LAB — CBC W/O DIFF
HCT: 34 % — ABNORMAL LOW (ref 35.0–47.0)
HGB: 11.3 g/dL — ABNORMAL LOW (ref 11.5–16.0)
MCH: 25.9 PG — ABNORMAL LOW (ref 26.0–34.0)
MCHC: 33.2 g/dL (ref 30.0–36.5)
MCV: 78 FL — ABNORMAL LOW (ref 80.0–99.0)
PLATELET: 318 10*3/uL (ref 150–400)
RBC: 4.36 M/uL (ref 3.80–5.20)
RDW: 14.7 % — ABNORMAL HIGH (ref 11.5–14.5)
WBC: 12.7 10*3/uL — ABNORMAL HIGH (ref 3.6–11.0)

## 2013-06-18 LAB — METABOLIC PANEL, BASIC
Anion gap: 13 mmol/L (ref 5–15)
BUN/Creatinine ratio: 11 — ABNORMAL LOW (ref 12–20)
BUN: 5 MG/DL — ABNORMAL LOW (ref 6–20)
CO2: 23 mmol/L (ref 21–32)
Calcium: 10.1 MG/DL (ref 8.5–10.1)
Chloride: 101 mmol/L (ref 97–108)
Creatinine: 0.46 MG/DL (ref 0.45–1.15)
GFR est AA: >60 mL/min/{1.73_m2} (ref >60)
GFR est non-AA: >60 mL/min/{1.73_m2} (ref >60)
Glucose: 142 mg/dL — ABNORMAL HIGH (ref 65–100)
Potassium: 3.4 mmol/L — ABNORMAL LOW (ref 3.5–5.1)
Sodium: 137 mmol/L (ref 136–145)

## 2013-06-18 LAB — GLUCOSE, POC
Glucose (POC): 147 mg/dL — ABNORMAL HIGH (ref 65–105)
Glucose (POC): 183 mg/dL — ABNORMAL HIGH (ref 65–105)
Glucose (POC): 116 mg/dL — ABNORMAL HIGH (ref 65–105)

## 2013-06-18 LAB — TROPONIN I
Troponin-I, Qt.: 0.09 ng/mL — ABNORMAL HIGH (ref <0.05)
Troponin-I, Qt.: 0.09 ng/mL — ABNORMAL HIGH (ref <0.05)

## 2013-06-18 LAB — MAGNESIUM: Magnesium: 1.6 mg/dL (ref 1.6–2.4)

## 2013-06-18 MED ADMIN — diphenhydrAMINE (BENADRYL) injection 12.5 mg: INTRAVENOUS | @ 06:00:00 | NDC 63323066401

## 2013-06-18 MED ADMIN — valsartan (DIOVAN) tablet 80 mg: ORAL | @ 15:00:00 | NDC 00078035834

## 2013-06-18 MED ADMIN — enoxaparin (LOVENOX) injection 40 mg: SUBCUTANEOUS | @ 17:00:00 | NDC 00075801401

## 2013-06-18 MED ADMIN — diphenhydrAMINE (BENADRYL) injection 12.5 mg: INTRAVENOUS | @ 01:00:00 | NDC 63323066401

## 2013-06-18 MED ADMIN — metoprolol (LOPRESSOR) tablet 25 mg: ORAL | @ 12:00:00 | NDC 50742010705

## 2013-06-18 MED ADMIN — labetalol (NORMODYNE;TRANDATE) 20 mg/4 mL (5 mg/mL) injection 10 mg: INTRAVENOUS | @ 16:00:00 | NDC 00409233934

## 2013-06-18 MED ADMIN — labetalol (NORMODYNE;TRANDATE) 20 mg/4 mL (5 mg/mL) injection 10 mg: INTRAVENOUS | @ 02:00:00 | NDC 00409233934

## 2013-06-18 MED ADMIN — clopidogrel (PLAVIX) tablet 75 mg: ORAL | @ 12:00:00 | NDC 55111019605

## 2013-06-18 MED ADMIN — insulin lispro (HUMALOG) injection: SUBCUTANEOUS | @ 16:00:00 | NDC 99999192301

## 2013-06-18 MED ADMIN — ceftriaxone (ROCEPHIN) 1 g in 0.9% sodium chloride (MBP/ADV) 50 mL MBP: INTRAVENOUS | @ 12:00:00 | NDC 00409733201

## 2013-06-18 MED ADMIN — sodium chloride (NS) flush 5-10 mL: INTRAVENOUS | @ 01:00:00 | NDC 87701099893

## 2013-06-18 MED ADMIN — metoprolol (LOPRESSOR) tablet 25 mg: ORAL | @ 23:00:00 | NDC 50742010705

## 2013-06-18 MED ADMIN — sodium chloride (NS) flush 5-10 mL: INTRAVENOUS | @ 18:00:00 | NDC 87701099893

## 2013-06-18 MED ADMIN — sodium chloride (NS) flush 5-10 mL: INTRAVENOUS | @ 10:00:00 | NDC 87701099893

## 2013-06-18 MED ADMIN — potassium chloride (KLOR-CON) packet 40 mEq: ORAL | @ 15:00:00 | NDC 00245003589

## 2013-06-18 NOTE — Progress Notes (Signed)
Paged by nursing re: agitated and insomnia. Seroquel requested by family. QTC 470s, on tele.  Will give small dose 12.5mg  tonight. Monitor on tele.

## 2013-06-18 NOTE — Progress Notes (Signed)
Problem: Dysphagia (Adult)  Goal: *Acute Goals and Plan of Care (Insert Text)  SWALLOWING GOALS INITIATED 06-16-13:  1) SLP to re-eval when more alert for diet tolerance by 06-18-13 -met  2) Tolerate dysphagia 1, nectars without s/s aspiration or MBS in am 06-19-13  SPEECH LANGUAGE PATHOLOGY DYSPHAGIA TREATMENT  Patient: Chelsea Schultz (79 y.o. female)  Date: 06/18/2013  Diagnosis: Metabolic encephalopathy Cerebral thrombosis with cerebral infarction       Precautions: aspiration         ASSESSMENT:  Patient more alert, but still confused. Some choking during PO. Family reports this is premorbid.  This may be oral-pharyngeal or esophageal.  May need MBS if s/s aspiration persist. Aspiration risk in that patient is a feeder as well and confused.   Progression toward goals:  [ ]          Improving appropriately and progressing toward goals  [X]          Improving slowly and progressing toward goals  [ ]          Not making progress toward goals and plan of care will be adjusted       PLAN:  Recommendations and Planned Interventions:  Continue dysphagia 1, nectars.   MBS tomorrow if any additional dysphagia.    Patient continues to benefit from skilled intervention to address the above impairments.  Continue treatment per established plan of care.  Discharge Recommendations:  Skilled Nursing Facility       SUBJECTIVE:   Patient's daughter reported that coughing is premorbid at meals.   RN concerned b/c she is coughing at meals. .      OBJECTIVE:   Cognitive and Communication Status:  Neurologic State: Alert;Confused  Orientation Level: Oriented to person  Cognition: Decreased attention/concentration  Perception: Cues to attend left visual field;Cues to attend right visual field  Perseveration: Perseverates during ADLS;Perseverates during mobility  Safety/Judgement: Decreased awareness of environment  Dysphagia Treatment:  Oral Assessment:     P.O. Trials:  Patient Position: upright in bed  Vocal quality prior to P.O.:   (weak)  Consistency Presented: Nectar thick liquid;Puree  How Presented: Cup/sip;Spoon (family fed)   ORAL PHASE:   Bolus Acceptance: Impaired (delayed response to spoon, cup)  Bolus Formation/Control: Impaired (mild oral hold)     Propulsion: Delayed (# of seconds)  Oral Residue: Less than 10% of bolus  PHARYNGEAL PHASE:   Initiation of Swallow: No impairment  Laryngeal Elevation: Functional  Aspiration Signs/Symptoms: intermittant coughing s/p PO.                                                                                                                                                     Pain:  Pain Scale 1: Adult Nonverbal Pain Scale  Pain Intensity 1: 0     After treatment:   [ ]   Patient left in no apparent distress sitting up in chair  [X]               Patient left in no apparent distress in bed  [ ]               Call bell left within reach  [X]               Nursing notified  [ ]               Caregiver present  [ ]               Bed alarm activated      COMMUNICATION/EDUCATION:   The patient???s plan of care including recommendations, planned interventions, and recommended diet changes were discussed with: Registered Nurse.  [ ]               Posted safety precautions in patient's room.    Malachi Bondsenise G Galbreath, SLP  Time Calculation: 15 mins

## 2013-06-18 NOTE — Progress Notes (Addendum)
Problem: Mobility Impaired (Adult and Pediatric)  Goal: *Acute Goals and Plan of Care (Insert Text)  Physical Therapy Goals  Initiated 06/15/2013; Revised 06/16/2013  1. Patient will move from supine to sit and sit to supine in bed with moderate assistance within 7 day(s).   2. Patient will transfer from sit to/from stand with minimal assistance using the least restrictive device within 7 day(s).  3. Patient will ambulate 10??? using rolling walker with minimal assistance within 7 days.  4. Patient will perform unsupported sitting x 10 min with supervision for improved trunk strength and balance to improve functional mobility within 7 days.    PHYSICAL THERAPY TREATMENT  Patient: Chelsea Schultz (79 y.o. female)  Date: 06/18/2013  Diagnosis: Metabolic encephalopathy Cerebral thrombosis with cerebral infarction       Precautions:   Falls     ASSESSMENT:  Chelsea Schultz presents with very slowly improving mobility and activity tolerance. Pt demonstrating decreased attention to left and persistent right cervical rotation throughout encounter today. She participates in AROM exercises with tactile and verbal cueing for right extremities, and PROM of left extremities with occasional left hip extensor activation and one instance of right shoulder flexion. Pt unable to perform finger to nose on right or left and loses attention after 3 trials. Attempted trunk flexion activities in bed however pt consistently resisting and demonstrating decreased interactions with total assist support in long-sitting. She requires total assistance for bed mobility and positioning today. Family reports pt still has not slept.   Progression toward goals:  [ ]     Improving appropriately and progressing toward goals  [X]     Improving slowly and progressing toward goals  [ ]     Not making progress toward goals and plan of care will be adjusted       PLAN:  Patient continues to benefit from skilled intervention to address the above impairments.   Continue treatment per established plan of care.  Discharge Recommendations:  Skilled Nursing Facility  Further Equipment Recommendations for Discharge:  Defer to rehab       SUBJECTIVE:   Patient with appropriate conversations ~25% of encounter, appropriately greeting therapist on arrival.      OBJECTIVE DATA SUMMARY:   Critical Behavior:  Neurologic State: Alert;Confused  Orientation Level: Oriented to person at times. Pt initially states she forgot who daughter is.   Cognition: Decreased attention/concentration  Safety/Judgement: Decreased awareness of environment  Functional Mobility Training:  Bed Mobility:  Rolling: Total assistance        Scooting: Total assistance (up in bed)                                           Balance:   sitting balance: poor, requires total assistance and consistently offers resistance     Attempted x 4 trials  Ambulation/Gait Training:         Pt unable                                                                  Therapeutic Exercises:   AAROM right upper and lower extremities in all planes, requires manual assistance to initiate. PROM left  lower extremity with hip extensor initiation x 3. Right upper extremity ROM with shoulder flexion initiation on one occasion.   Pain:  Pain Scale 1: Adult Nonverbal Pain Scale  Pain Intensity 1: 0  At rest               Activity Tolerance:   Fair to activities above   Please refer to the flowsheet for vital signs taken during this treatment.  After treatment:   [ ]     Patient left in no apparent distress sitting up in chair  [X]     Patient left in no apparent distress in bed  [X]     Call bell left within reach  [X]     Nursing notified  [X]     Caregiver present  [X]     Bed alarm activated      COMMUNICATION/COLLABORATION:   The patient???s plan of care was discussed with: Registered Nurse    Juliann Pares, PT, DPT   Time Calculation: 27 mins

## 2013-06-18 NOTE — Progress Notes (Signed)
[facility]  [address] [place], [place] [address]  [phone]      Medical Progress Note      NAME: Chelsea Schultz   DOB:  May 21, 1926  MRM:  [id number]    Date/Time: 06/18/2013  9:26 AM       Assessment / Plan:     Acute CVA:  MRI brain with tiny acute infarct in the right frontal periventricular region.  Carotid dopplers without significant stenosis.  After re-eval by speech, started on dysphagia diet.  Echo with EF 65% with mild LVH and grade 1 diastolic dysfunction.   -- PT / OT   -- CM for SNF   -- continue Plavix     Encephalopathy (06/13/2013):  Due to CVA, UTI.  LP was negative for infection.  EEG reportedly without epileptiform activity.    -- supportive care     HTN:  BP still elevated.   -- continue metoprolol   -- restart diovan    UTI:   -- stop ceftriaxone after today, day #5    DM type 2 (diabetes mellitus, type 2) (06/13/2013):  A1c 6.5 this admission.  Overall controlled.   -- SSI alone for now   -- may need to restart metformin as diet improves    Elevated cardiac enzymes:  Evaluated by cards on admission and no further evaluation recommended at this time.  Already trending down.  No WMA noted on echo.    Insomnia:  Ativan caused excess sedation.   -- advised to keep patient awake during the day and keep window blinds open during the day   -- benadryl prn at night    Rales:  Scant by exam.   -- check CXR   -- add incentive spirometry   -- stop IVF       Subjective:     Chief Complaint:  Not sleeping.    Daughter reports patient not sleeping last night.  Patient denies pain.  No cough.    ROS:  (bold if positive, if negative)              Objective:       Vitals:          Last 24hrs VS reviewed since prior progress note. Most recent are:    Visit Vitals   Item Reading   ??? BP 169/62   ??? Pulse 98   ??? Temp 98.3 ??F (36.8 ??C)   ??? Resp 20   ??? Ht 4\' 7"  (1.397 m)   ??? Wt 52.164 kg (115 lb)   ??? BMI 26.73 kg/m2   ??? SpO2 96%     SpO2 Readings from Last 6 Encounters:    06/18/13 96%   04/03/12 97%   07/01/10 98%    O2 Flow Rate (L/min): 2 l/min       Intake/Output Summary (Last 24 hours) at 06/18/13 0926  Last data filed at 06/18/13 6962   Gross per 24 hour   Intake 1802.5 ml   Output      0 ml   Net 1802.5 ml          Exam:     Physical Exam:    Gen:  Well-developed, well-nourished, in no acute distress  HEENT:  Pink conjunctivae, hearing intact to voice, moist mucous membranes  Neck:  Supple, without masses  Resp:  No accessory muscle use, scant bibasilar rales, no wheezes or rhonchi  Card:  No murmurs, normal S1, S2  without thrills or peripheral edema  Abd:  Soft, non-tender, non-distended, normoactive bowel sounds are present  Musc:  No cyanosis  Skin:  No rashes or ulcers, skin turgor is good  Neuro:  Face symmetric, tongue midline, speech fluent, grip strength is 5/5 bilaterally and dorsi / plantarflexion is 5/5 bilaterally, follows commands appropriately  Psych:  Poor insight, oriented to person and place, alert     Telemetry reviewed:   sinus arrhythmia    Medications Reviewed: (see below)    Lab Data Reviewed: (see below)    ______________________________________________________________________    Medications:     Current Facility-Administered Medications   Medication Dose Route Frequency   ??? insulin lispro (HUMALOG) injection   SubCUTAneous AC&HS   ??? potassium chloride (KLOR-CON) packet 40 mEq  40 mEq Oral NOW   ??? diphenhydrAMINE (BENADRYL) capsule 25 mg  25 mg Oral QHS PRN   ??? [COMPLETED] potassium chloride 10 mEq in 100 ml IVPB  10 mEq IntraVENous Q1H   ??? metoprolol (LOPRESSOR) tablet 25 mg  25 mg Oral BID   ??? labetalol (NORMODYNE;TRANDATE) 20 mg/4 mL (5 mg/mL) injection 10 mg  10 mg IntraVENous Q4H PRN   ??? clopidogrel (PLAVIX) tablet 75 mg  75 mg Oral DAILY   ??? enoxaparin (LOVENOX) injection 40 mg  40 mg SubCUTAneous Q24H   ??? sodium chloride (NS) flush 5-10 mL  5-10 mL IntraVENous Q8H   ??? sodium chloride (NS) flush 5-10 mL  5-10 mL IntraVENous PRN   ??? acetaminophen  (TYLENOL) tablet 650 mg  650 mg Oral Q4H PRN   ??? acetaminophen (TYLENOL) tablet 650 mg  650 mg Oral Q4H PRN   ??? diphenhydrAMINE (BENADRYL) injection 12.5 mg  12.5 mg IntraVENous Q4H PRN   ??? ondansetron (ZOFRAN) injection 4 mg  4 mg IntraVENous Q8H PRN   ??? 0.9% sodium chloride infusion  50 mL/hr IntraVENous CONTINUOUS   ??? glucose chewable tablet 16 g  4 tablet Oral PRN   ??? dextrose (D50W) injection syrg 12.5-25 g  12.5-25 g IntraVENous PRN   ??? glucagon (GLUCAGEN) injection 1 mg  1 mg IntraMUSCular PRN   ??? ceftriaxone (ROCEPHIN) 1 g in 0.9% sodium chloride (MBP/ADV) 50 mL MBP  1 g IntraVENous Q24H            Lab Review:     Recent Labs      06/18/13   0449  06/17/13   0330  06/16/13   1115   WBC  12.7*  12.3*  13.9*   HGB  11.3*  10.3*  10.6*   HCT  34.0*  30.7*  31.6*   PLT  318  251  252     Recent Labs      06/18/13   0449  06/17/13   0330  06/16/13   1115  06/15/13   1220   NA  137  141  139  133*   K  3.4*  3.3*  3.3*  3.5   CL  101  105  104  97   CO2  23  20*  20*  23   GLU  142*  79  119*  141*   BUN  5*  15  19  9    CREA  0.46  0.59  0.63  0.51   CA  10.1  9.4  9.6  9.9   MG  1.6  1.7  2.1  1.6   PHOS   --    --    --  2.4*     Lab Results   Component Value Date/Time    Glucose (POC) 147 06/18/2013 12:03 AM    Glucose (POC) 253 06/17/2013  6:10 PM    Glucose (POC) 103 06/17/2013 11:32 AM    Glucose (POC) 158 06/17/2013  6:46 AM    Glucose (POC) 77 06/17/2013  6:24 AM    Glucose POC 137 03/10/2011  9:27 AM    Glucose POC 125 07/01/2010  5:10 PM         Total time spent with patient: 30 Minutes                  Care Plan discussed with: Patient and Family    Discussed:  Care Plan    Prophylaxis:  Lovenox    Disposition:  SNF/LTC           ___________________________________________________    Attending Physician: Suzan Garibaldi, MD

## 2013-06-18 NOTE — Other (Signed)
Bedside and Verbal shift change report given to Theron Arista RN  (oncoming nurse) by Marlow Baars, RN   (offgoing nurse). Report included the following information SBAR, Kardex, Intake/Output, MAR and Recent Results.

## 2013-06-18 NOTE — Progress Notes (Signed)
Consult noted for SNF, called pt's daughter Lynden Ang [phone] and left her a voice mail message to discuss. Will continue to follow and assist with d/c planning. Thanks Ames Dura, MSW

## 2013-06-18 NOTE — Progress Notes (Signed)
Bedside shift report given to [facility]  RN by Kathlyn Sacramento, RN.  Report given using SBAR.  med's given po  Crushed in apple sauce.. Moderate amount of coughing observed, encouraged pt to sit uo tuck chin and eat slowly

## 2013-06-18 NOTE — Progress Notes (Signed)
NUTRITION      ASSESSMENT:   Pt now on dysphagia 1 diet w/ nectar liquids. Plans for possible MBS tomorrow. She is tolerating po w/ fair intake so far. Pt is meeting ~ 75% kcal needs, 76% pro needs if intakes are accurate. Will provide SF CIB w/ nectar milk TID for additional kcal/pro intake to help ensure adequate po.     Diet: Dysphagia 1, nectar  Patient Vitals for the past 100 hrs:   % Diet Eaten   06/18/13 1335 20 %   06/18/13 0929 50 %   06/17/13 1617 50 %   06/15/13 0918 0 %   average intake of pureed diet: 40% (788 kcals, 40 g pro)  Skin: no pressure ulcers   Labs and Medications: Reviewed  [x]        [x]   No nutrition education needs identified   [x]   No cultural, religious, or ethnic dietary needs identified    [x]   Participated in care plan, discharge planning, and/or Interdisciplinary rounds      Anthropometrics:  ,  , BMI (calculated): 26.8  Wt Readings from Last 1 Encounters:   06/13/13 52.164 kg (115 lb)      Ht Readings from Last 1 Encounters:   06/13/13 4\' 7"  (1.397 m)    Body mass index is 26.73 kg/(m^2).    Estimated Nutrition Needs: 1049 Kcals/day (msje x 1.3), Protein (g): 52 g (1 g/kg) Fluid (ml):  (1 ml/kcal)  Based on:   [x]              Actual BW    []              IBW   []                Adjusted BW      NUTRITION DIAGNOSIS & Intervention:   Swallowing difficulty R/T CVA AEB pt on dysphagia 1 pureed diet per SLP  Intervention: Provide nectar thick supplement w/ meals    Goal: Pt will meet >75% of nutrition needs via meals/supplements over next 3-5 days    Nutrition Risk Identified:  [x]     MONITORING & EVALUATION:   Will continue to monitor diet changes and intakes  Will provide further assessment and intervention as need determined    Curt Jews. Orlene Erm, RD

## 2013-06-19 LAB — METABOLIC PANEL, BASIC
Anion gap: 10 mmol/L (ref 5–15)
BUN/Creatinine ratio: 15 (ref 12–20)
BUN: 9 MG/DL (ref 6–20)
CO2: 25 mmol/L (ref 21–32)
Calcium: 10.5 MG/DL — ABNORMAL HIGH (ref 8.5–10.1)
Chloride: 104 mmol/L (ref 97–108)
Creatinine: 0.6 MG/DL (ref 0.45–1.15)
GFR est AA: >60 mL/min/{1.73_m2} (ref >60)
GFR est non-AA: >60 mL/min/{1.73_m2} (ref >60)
Glucose: 153 mg/dL — ABNORMAL HIGH (ref 65–100)
Potassium: 3.7 mmol/L (ref 3.5–5.1)
Sodium: 139 mmol/L (ref 136–145)

## 2013-06-19 LAB — GLUCOSE, POC
Glucose (POC): 135 mg/dL — ABNORMAL HIGH (ref 65–105)
Glucose (POC): 205 mg/dL — ABNORMAL HIGH (ref 65–105)
Glucose (POC): 205 mg/dL — ABNORMAL HIGH (ref 65–105)
Glucose (POC): 205 mg/dL — ABNORMAL HIGH (ref 65–105)

## 2013-06-19 LAB — MAGNESIUM: Magnesium: 1.6 mg/dL (ref 1.6–2.4)

## 2013-06-19 LAB — EKG, 12 LEAD, INITIAL
Atrial Rate: 97 {beats}/min
Calculated P Axis: 21 degrees
Calculated R Axis: 14 degrees
Calculated T Axis: 2 degrees
P-R Interval: 132 ms
Q-T Interval: 352 ms
QRS Duration: 72 ms
QTC Calculation (Bezet): 447 ms
Ventricular Rate: 97 {beats}/min

## 2013-06-19 LAB — OCCULT BLOOD, STOOL: Occult blood, stool: NEGATIVE

## 2013-06-19 LAB — PTH INTACT
Calcium: 10.6 MG/DL — ABNORMAL HIGH (ref 8.5–10.1)
PTH, Intact: 30.5 pg/mL (ref 14.0–72.0)

## 2013-06-19 LAB — CULTURE, BLOOD: Culture result:: NO GROWTH

## 2013-06-19 MED ADMIN — metoprolol (LOPRESSOR) tablet 50 mg: ORAL | @ 22:00:00 | NDC 50742010810

## 2013-06-19 MED ADMIN — enoxaparin (LOVENOX) injection 40 mg: SUBCUTANEOUS | @ 17:00:00 | NDC 00075801401

## 2013-06-19 MED ADMIN — sodium chloride (NS) flush 5-10 mL: INTRAVENOUS | @ 18:00:00 | NDC 87701099893

## 2013-06-19 MED ADMIN — 0.9% sodium chloride infusion: INTRAVENOUS | @ 19:00:00 | NDC 00409798309

## 2013-06-19 MED ADMIN — potassium chloride (KLOR-CON) packet 20 mEq: ORAL | @ 13:00:00 | NDC 00245003589

## 2013-06-19 MED ADMIN — magnesium sulfate 2 g/50 ml IVPB (premix or compounded): INTRAVENOUS | @ 13:00:00 | NDC 00409672924

## 2013-06-19 MED ADMIN — metoprolol (LOPRESSOR) tablet 50 mg: ORAL | @ 13:00:00 | NDC 50742010810

## 2013-06-19 MED ADMIN — labetalol (NORMODYNE;TRANDATE) 20 mg/4 mL (5 mg/mL) injection 10 mg: INTRAVENOUS | @ 02:00:00 | NDC 00409233934

## 2013-06-19 MED ADMIN — insulin lispro (HUMALOG) injection: SUBCUTANEOUS | @ 12:00:00 | NDC 99999192301

## 2013-06-19 MED ADMIN — clopidogrel (PLAVIX) tablet 75 mg: ORAL | @ 13:00:00 | NDC 55111019605

## 2013-06-19 MED ADMIN — insulin lispro (HUMALOG) injection: SUBCUTANEOUS | @ 17:00:00 | NDC 99999192301

## 2013-06-19 MED ADMIN — sodium chloride (NS) flush 5-10 mL: INTRAVENOUS | @ 02:00:00 | NDC 87701099893

## 2013-06-19 MED ADMIN — sodium chloride (NS) flush 5-10 mL: INTRAVENOUS | @ 10:00:00 | NDC 87701099893

## 2013-06-19 MED ADMIN — insulin lispro (HUMALOG) injection: SUBCUTANEOUS | @ 21:00:00 | NDC 99999192301

## 2013-06-19 MED ADMIN — valsartan (DIOVAN) tablet 80 mg: ORAL | @ 13:00:00 | NDC 51660014190

## 2013-06-19 MED ADMIN — quetiapine (SEROQUEL) tablet 12.5 mg: ORAL | @ 04:00:00 | NDC 68180044501

## 2013-06-19 NOTE — Progress Notes (Signed)
Problem: Falls - Risk of  Goal: *Absence of falls  Outcome: Progressing Towards Goal  Pt free from falls. Fall precautions in place. Family member at bedside. Pt and family member encouraged to call for assistance when needed. Will continue to monitor.

## 2013-06-19 NOTE — Discharge Summary (Signed)
Physician Interim Summary   Patient ID:  Chelsea Schultz  [id number]  79 y.o.  1926/06/21    PCP: Angeline Slim, MD     Consults: Cardiology and Neurology    Covering dates: 06/13/2013 through 06/19/2013    Admission Diagnoses: Metabolic encephalopathy    Hospital Course:   Acute CVA:  MRI brain with tiny acute infarct in the right frontal periventricular region. Carotid dopplers without significant stenosis. After re-eval by speech, started on dysphagia diet. Echo with EF 65% with mild LVH and grade 1 diastolic dysfunction. Patient will need continued PT/OT and SNF evaluation.  Plavix has been continued.    Encephalopathy (06/13/2013): Due to CVA, UTI likely.  LP was negative for infection.  EEG without epileptiform activity.    HTN:  As BP remains elevated, metoprolol was increased.  Lower dose diovan continued.    UTI:  Patient completed course of ceftriaxone this admission.    DM type 2 (diabetes mellitus, type 2) (06/13/2013): A1c 6.5 this admission and controlled without home metformin.    Elevated cardiac enzymes:  Patient was evaluated by cards on admission and no further evaluation recommended at this time.  No WMA noted on echo.  Asymptomatic bout of NSVT overnight.  repleting mag and K.    Hypercalcemia:  Suspect this to be relative bed rest / immobility.  Awaiting vitamin D and PTH.      Additional hospital course and discharge summary will be done by discharging physician.

## 2013-06-19 NOTE — Progress Notes (Signed)
Problem: Dysphagia (Adult)  Goal: *Acute Goals and Plan of Care (Insert Text)  SWALLOWING GOALS INITIATED 06-16-13:  1) SLP to re-eval when more alert for diet tolerance by 06-18-13 -met  2) Tolerate dysphagia 1, nectars without s/s aspiration or MBS in am 06-19-13 MET 06/19/13  Added 06/19/13:  3. Patient will tolerate dysphagia 1 diet and thin liquids with no overt s/s aspiration within 7 days.  4. Patient will tolerate mech soft solids with increased mastication efficiency and no overt s/s aspiration within 7 days.  SPEECH PATHOLOGY MODIFIED BARIUM SWALLOW STUDY  Patient: Chelsea Schultz (79 y.o. female)  Date: 06/19/2013  Primary Diagnosis: Metabolic encephalopathy        Precautions: aspiration         ASSESSMENT :  Based on the objective data described below, the patient presents with mild-moderate oral phase and mild pharyngeal phase dysphagia characterized by decreased mastication of solid, delayed bolus formation and propulsion, delayed swallow initiation with pooling ranging from 3-10 seconds, and penetration x1 with thin via cup sip.  Subsequent cup sips and straw use revealed no further penetration.  No aspiration of any consistency.  Patient's delay and cognitive status continue to place patient at aspiration risk.    Patient will benefit from skilled intervention to address the above impairments.  Patient???s rehabilitation potential is considered to be Fair  Factors which may influence rehabilitation potential include:   [ ]               None noted  [X]               Mental ability/status  [ ]               Medical condition  [ ]               Home/family situation and support systems  [ ]               Safety awareness  [ ]               Pain tolerance/management  [ ]               Other:        PLAN :  Recommendations and Planned Interventions:  1.  Dypshagia 1 diet with thin liquids when patient alert and able to participate  2.  Continue SLP treatment for diet tolerance and upgrade as clinicallly indicated   3.  Small sips and bites; straw OK  4.  Supervision with po  Frequency/Duration: Patient will be followed by speech-language pathology 3 times a week to address goals.          SUBJECTIVE:   Patient stated ???I eat OK???.      OBJECTIVE:       Past Medical History   Diagnosis Date   ??? Diabetes     ??? Hypertension     ??? Neuropathy     ??? Spinal stenosis     ??? CVA (cerebral infarction)     ??? GERD (gastroesophageal reflux disease)     ??? Nausea & vomiting     ??? Arthritis     ??? Osteopetrosis       Past Surgical History   Procedure Laterality Date   ??? Hx orthopaedic           Lumbar laminectomy   ??? Hx orthopaedic           Lumbar fusion     Prior Level of Function/Home Situation:  Home Situation  Home Environment: Private residence  One/Two Story Residence: Two story, live on 1st floor  Living Alone: No  Support Systems: Child(ren)  Patient Expects to be Discharged to:: Private residence  Current DME Used/Available at Home: Grab bars;Walker;Shower chair;Hospital bed    Current Diet:  Dysphagia 1 with nectar thick liquids   Radiologist: Dr. Buzzy Han  Film Views: Lateral;Fluoro  Patient Position: upright in chair      Trial 1:   Consistency Presented: Puree;Solid;Thin liquid   How Presented: Self-fed/presented;SLP-fed/presented;Cup/sip;Spoon;Straw       Bolus Acceptance: No impairment   Bolus Formation/Control: Impaired: Delayed;Mastication   Propulsion: Delayed (# of seconds)   Oral Residue: None   Initiation of Swallow: Triggered at vallecula   Timing: Pooling 1-5 sec;Pooling 6-10 sec   Penetration: Flash/transient (x1 with cup sip only)   Aspiration/Timing: No evidence of aspiration   Pharyngeal Clearance: No residue                       Decreased Tongue Base Retraction?: No  Laryngeal Elevation: WFL (within functional limits)  Aspiration/Penetration Score: 2 (Penetration/No residue-Contrast enters the airway penetrates, remains above the folds/cords, and is cleared)  Pharyngeal Symmetry: Not assessed  Pharyngeal-Esophageal  Segment: No impairment  Pharyngeal Dysfunction:  (Delayed initiataion; pooling prior to swallow)    Oral Phase Severity: Mild-moderate  Pharyngeal Phase Severity: Mild      COMMUNICATION/EDUCATION:   The patient???s plan of care including findings from MBS, recommendations, planned interventions, and recommended diet changes were discussed with: Speech Therapist and Registered Nurse.  [ ]   Posted safety precautions in patient's room.  [X]   Patient/family have participated as able in goal setting and plan of care.  [ ]   Patient/family agree to work toward stated goals and plan of care.  [ ]   Patient understands intent and goals of therapy, but is neutral about his/her participation.  [ ]   Patient is unable to participate in goal setting and plan of care.    Thank you for this referral.  Kelby Fam, SLP  Time Calculation: 12 mins

## 2013-06-19 NOTE — Progress Notes (Signed)
Problem: Mobility Impaired (Adult and Pediatric)  Goal: *Acute Goals and Plan of Care (Insert Text)  Physical Therapy Goals  Initiated 06/15/2013; Revised 06/16/2013  1. Patient will move from supine to sit and sit to supine in bed with moderate assistance within 7 day(s).   2. Patient will transfer from sit to/from stand with minimal assistance using the least restrictive device within 7 day(s).  3. Patient will ambulate 10??? using rolling walker with minimal assistance within 7 days.  4. Patient will perform unsupported sitting x 10 min with supervision for improved trunk strength and balance to improve functional mobility within 7 days.    PHYSICAL THERAPY TREATMENT  Patient: Chelsea Schultz (79 y.o. female)  Date: 06/19/2013  Diagnosis: Metabolic encephalopathy Cerebral thrombosis with cerebral infarction       Precautions:   Falls     ASSESSMENT:  Ms. Hogen demonstrates improved command-following, improving attention, AROM of all extremities and neck in gravity eliminated positions, and improving functional mobility. She performs unsupported sitting activities for static and dynamic balance as below with constant PT support. She performs sit to/from stand transfers x 3 with maximum assistance and requires consistent assistance with walker vs. Hand hold assistance. Pt tolerates stand-pivot transfer to bedside chair with total assistance and remains out of bed with pillow prop positioning, VSS.    Progression toward goals:  [ ]     Improving appropriately and progressing toward goals  [X]     Improving slowly and progressing toward goals  [ ]     Not making progress toward goals and plan of care will be adjusted       PLAN:  Patient continues to benefit from skilled intervention to address the above impairments.  Continue treatment per established plan of care.  Discharge Recommendations:  Skilled Nursing Facility  Further Equipment Recommendations for Discharge:  Defer to rehab       SUBJECTIVE:   Patient stated  ???I'm good,??? repeated after each PT question regarding symptoms.      OBJECTIVE DATA SUMMARY:   Critical Behavior:  Neurologic State: Alert  Orientation Level: Oriented to person  Cognition: Follows commands  Safety/Judgement: Decreased awareness of environment  Functional Mobility Training:  Bed Mobility:     Supine to Sit: Additional time;Assist X2;Maximum assistance;Verbal cues for techniques     Scooting: Additional time;Maximum assistance;Verbal cues        Transfers:  Sit to Stand: Additional time;Assist X2;Maximum assistance;Verbal cues performed x 3, one trial with hand hold assistance x 2; walker on other attempts.   Stand to Sit: Additional time;Assist X2;Maximum assistance;Verbal cues     Stand Pivot Transfers: Total assistance                       Balance:  Sitting: Impaired  Sitting - Static: Poor (constant support)  Sitting - Dynamic: Poor (constant support)  Standing: Impaired  Standing - Static: Poor  Standing - Dynamic : Poor  Ambulation/Gait Training:        Ambulation - Level of Assistance: Unable at this time (very minimal weight-shifting)         pt cued for lateral weight shifts and for attempted steps; pt unable to take steps at this time.  Neuro Re-Education:  Unsupported sitting activities including reaching outside base of support laterally, diagonally, and anteriorly with tactile and verbal cues; upper extremity prop with manual, tactile, and verbal cues. Balance challenge with cervical AROM initiated with verbal cues and with target searching.   Therapeutic Exercises:    AAROM bilateral upper extremities and lower extremities in all planes.  Pain:  Pain Scale 1: Visual  Pain Intensity 1: 0              Activity Tolerance:   No signs/symptoms of distress  Please refer to the flowsheet for vital signs taken during this treatment.  After treatment:   [X]     Patient left in no apparent distress sitting up in chair, left upper extremity  propped on pillow, head supported to reduce right rotation.  [ ]     Patient left in no apparent distress in bed  [X]     Call bell left within reach  [X]     Nursing notified, advised mobility team assistance for pt to return to bed  [X]     Caregiver present  [ ]     Bed alarm activated      COMMUNICATION/COLLABORATION:   The patient???s plan of care was discussed with: Registered Nurse and Rehabilitation Attendant    Juliann Pares, PT, DPT   Time Calculation: 39 mins

## 2013-06-19 NOTE — Progress Notes (Addendum)
[facility]  [address] [place], [place] [address]  [phone]      Medical Progress Note      NAME: Chelsea Schultz   DOB:  July 15, 1926  MRM:  [id number]    Date/Time: 06/19/2013  7:54 AM       Assessment / Plan:     Acute CVA:  MRI brain with tiny acute infarct in the right frontal periventricular region.  Carotid dopplers without significant stenosis.  After re-eval by speech, started on dysphagia diet.  Echo with EF 65% with mild LVH and grade 1 diastolic dysfunction.   -- PT / OT   -- CM for SNF   -- continue Plavix     Encephalopathy (06/13/2013):  Due to CVA, UTI.  LP was negative for infection.  EEG reportedly without epileptiform activity.  Not improved, but stable.  Did sleep for 3 hours last night.   -- supportive care     HTN:  BP still elevated but improved.   -- increase metoprolol   -- continue diovan    UTI:  Completed course of abx.    DM type 2 (diabetes mellitus, type 2) (06/13/2013):  A1c 6.5 this admission.  Overall controlled without metformin.   -- SSI alone for now    Elevated cardiac enzymes:  Evaluated by cards on admission and no further evaluation recommended at this time.  Already trending down.  No WMA noted on echo.  Asymptomatic bout of NSVT overnight.   -- replete mag and K    Rales:  Scant by exam.   -- check CXR   -- add incentive spirometry    Hypercalcemia:  Suspect this to be relative bed rest / immobility.  May be a component of mild volume depletion due to NPO status.   -- check vit D and PTH   -- follow calcium   -- restart gentle IVF       Subjective:     Chief Complaint:  Slept last night.    Slept approx 3 hours last night.  Reports mild pains in neck with turning head.  No n/v.  No pain.  No sob.    ROS:  (bold if positive, if negative)              Objective:       Vitals:          Last 24hrs VS reviewed since prior progress note. Most recent are:    Visit Vitals   Item Reading   ??? BP 157/72   ??? Pulse 94   ??? Temp 97.2 ??F (36.2 ??C)   ???  Resp 18   ??? Ht 4\' 7"  (1.397 m)   ??? Wt 52.164 kg (115 lb)   ??? BMI 26.73 kg/m2   ??? SpO2 96%     SpO2 Readings from Last 6 Encounters:   06/19/13 96%   04/03/12 97%   07/01/10 98%    O2 Flow Rate (L/min): 2 l/min       Intake/Output Summary (Last 24 hours) at 06/19/13 0754  Last data filed at 06/18/13 1335   Gross per 24 hour   Intake    480 ml   Output      0 ml   Net    480 ml          Exam:     Physical Exam:    Gen:  Well-developed, well-nourished, in no acute distress  HEENT:  Pink conjunctivae, hearing intact to voice, moist mucous membranes  Neck:  Supple  Resp:  No accessory muscle use, scant bibasilar rales, no wheezes or rhonchi  Card:  No murmurs, normal S1, S2 without thrills or peripheral edema  Abd:  Soft, non-tender, non-distended, normoactive bowel sounds are present  Musc:  No cyanosis  Skin:  No rashes or ulcers, skin turgor is good  Neuro:  Face symmetric, tongue midline, speech fluent, grip strength is 4/5 bilaterally and dorsi / plantarflexion is 4/5 bilaterally, follows commands appropriately  Psych:  Poor insight, oriented to person and place, alert     Telemetry reviewed:   sinus arrhythmia, 5 beats NSVT    Medications Reviewed: (see below)    Lab Data Reviewed: (see below)    ______________________________________________________________________    Medications:     Current Facility-Administered Medications   Medication Dose Route Frequency   ??? insulin lispro (HUMALOG) injection   SubCUTAneous AC&HS   ??? [COMPLETED] potassium chloride (KLOR-CON) packet 40 mEq  40 mEq Oral NOW   ??? diphenhydrAMINE (BENADRYL) capsule 25 mg  25 mg Oral QHS PRN   ??? valsartan (DIOVAN) tablet 80 mg  80 mg Oral DAILY   ??? [COMPLETED] quetiapine (SEROQUEL) tablet 12.5 mg  12.5 mg Oral ONCE PRN   ??? metoprolol (LOPRESSOR) tablet 25 mg  25 mg Oral BID   ??? labetalol (NORMODYNE;TRANDATE) 20 mg/4 mL (5 mg/mL) injection 10 mg  10 mg IntraVENous Q4H PRN   ??? clopidogrel (PLAVIX) tablet 75 mg  75 mg Oral DAILY   ??? enoxaparin  (LOVENOX) injection 40 mg  40 mg SubCUTAneous Q24H   ??? sodium chloride (NS) flush 5-10 mL  5-10 mL IntraVENous Q8H   ??? sodium chloride (NS) flush 5-10 mL  5-10 mL IntraVENous PRN   ??? acetaminophen (TYLENOL) tablet 650 mg  650 mg Oral Q4H PRN   ??? acetaminophen (TYLENOL) tablet 650 mg  650 mg Oral Q4H PRN   ??? diphenhydrAMINE (BENADRYL) injection 12.5 mg  12.5 mg IntraVENous Q4H PRN   ??? ondansetron (ZOFRAN) injection 4 mg  4 mg IntraVENous Q8H PRN   ??? glucose chewable tablet 16 g  4 tablet Oral PRN   ??? dextrose (D50W) injection syrg 12.5-25 g  12.5-25 g IntraVENous PRN   ??? glucagon (GLUCAGEN) injection 1 mg  1 mg IntraMUSCular PRN            Lab Review:     Recent Labs      06/18/13   0449  06/17/13   0330  06/16/13   1115   WBC  12.7*  12.3*  13.9*   HGB  11.3*  10.3*  10.6*   HCT  34.0*  30.7*  31.6*   PLT  318  251  252     Recent Labs      06/19/13   0600  06/18/13   0449  06/17/13   0330   NA  139  137  141   K  3.7  3.4*  3.3*   CL  104  101  105   CO2  25  23  20*   GLU  153*  142*  79   BUN  9  5*  15   CREA  0.60  0.46  0.59   CA  10.5*  10.1  9.4   MG  1.6  1.6  1.7     Lab Results   Component Value Date/Time    Glucose (POC) 135 06/18/2013  9:16 PM    Glucose (POC) 116 06/18/2013  4:19 PM    Glucose (POC) 183 06/18/2013 11:20 AM    Glucose (POC) 147 06/18/2013 12:03 AM    Glucose (POC) 253 06/17/2013  6:10 PM    Glucose POC 137 03/10/2011  9:27 AM    Glucose POC 125 07/01/2010  5:10 PM         Total time spent with patient: 55 Minutes                  Care Plan discussed with: Patient and Family    Discussed:  Care Plan    Prophylaxis:  Lovenox    Disposition:  SNF/LTC           ___________________________________________________    Attending Physician: Suzan Garibaldi, MD

## 2013-06-19 NOTE — Progress Notes (Addendum)
06-19-2013 CARE MANAGEMENT NOTE:  I spoke with pt's daughter, Shadell Brenn ([phone], [phone], [phone], [phone]), with whom the pt lives to determine discharge needs/plans. She is very interested in the pt going back to [facility] for rehab as she did very well there in the spring. I asked if she could also give me several other NH choices in case [facility] is unable to accept her but she refuses until she can do some research on facilities in the area. I told her that some nursing homes are more willing to keep patients long-term than others but she insisted that the pt would be returning home after rehab. I again stated that if pt is unable to return home, I need to know if she can afford long-term care or would need to apply for Medicaid as I would need to complete a UAI if so. The daughter again insisted that the pt will be returning home after rehab and would not be a long-term pt in a NH. CM will continue to follow and await a response from [facility] re availability. Raliegh Scarlet, BSW, CM    Referral was sent to [facility] on 06-19-2013 thru [facility]. Sudy Adams, BSW, CM

## 2013-06-19 NOTE — Progress Notes (Signed)
Received call from [facility] (telemetry tech) notifying runs of sinus tach after 0400. Pt sleeping and asymptomatic.   Called Dr. Verdie Mosher, received order for EKG.   Pt on Sinus arrythmia at 97 BPM. Called Dr. Verdie Mosher and received orders for blood work. Will continue to monitor.

## 2013-06-19 NOTE — Progress Notes (Signed)
Bedside and Verbal shift change report given to Jessica RN (oncoming nurse) by Jenny RN (offgoing nurse). Report included the following information SBAR, Kardex, Procedure Summary, Intake/Output, MAR, Accordion, Recent Results and Med Rec Status.

## 2013-06-19 NOTE — Progress Notes (Signed)
Bedside and Verbal shift change report given to Madaline Savage RN (oncoming nurse) by Trudi Ida (offgoing nurse). Report included the following information SBAR, Kardex, Intake/Output and Recent Results.

## 2013-06-19 NOTE — Other (Signed)
DTC Progress Note    Recommendations/ Comments: If appropriate, please consider restarting Januvia 100mg  daily for better BG control.       On a Dysphagia consistent carb diet.     Chart reviewed on [facility].      POC Glucose last 24hrs:   Lab Results   Component Value Date/Time    Glucose (POC) 205 06/19/2013  7:51 AM    Glucose (POC) 135 06/18/2013  9:16 PM    Glucose (POC) 116 06/18/2013  4:19 PM    Glucose (POC) 183 06/18/2013 11:20 AM    Glucose (POC) 147 06/18/2013 12:03 AM    Glucose POC 137 03/10/2011  9:27 AM    Glucose POC 125 07/01/2010  5:10 PM         Lab Results   Component Value Date/Time    Creatinine 0.60 06/19/2013  6:00 AM           Current hospital DM medications: Humalog Correction scale     Will continue to follow as needed.    Thank you.     Jeani Hawking RN,BSN,CDE

## 2013-06-19 NOTE — Progress Notes (Signed)
Paged by nursing re: frequent ectopy noted and nonsustained runs of SVT on tele  K 3.4 yesterday. Order BMP and Mg now, replete prn

## 2013-06-20 LAB — RENAL FUNCTION PANEL
Albumin: 2.4 g/dL — ABNORMAL LOW (ref 3.5–5.0)
Anion gap: 7 mmol/L (ref 5–15)
BUN/Creatinine ratio: 24 — ABNORMAL HIGH (ref 12–20)
BUN: 13 MG/DL (ref 6–20)
CO2: 27 mmol/L (ref 21–32)
Calcium: 9.6 MG/DL (ref 8.5–10.1)
Chloride: 109 mmol/L — ABNORMAL HIGH (ref 97–108)
Creatinine: 0.54 MG/DL (ref 0.45–1.15)
GFR est AA: >60 mL/min/{1.73_m2} (ref >60)
GFR est non-AA: >60 mL/min/{1.73_m2} (ref >60)
Glucose: 134 mg/dL — ABNORMAL HIGH (ref 65–100)
Phosphorus: 3.2 MG/DL (ref 2.5–4.9)
Potassium: 3.6 mmol/L (ref 3.5–5.1)
Sodium: 143 mmol/L (ref 136–145)

## 2013-06-20 LAB — GLUCOSE, POC
Glucose (POC): 138 mg/dL — ABNORMAL HIGH (ref 65–105)
Glucose (POC): 146 mg/dL — ABNORMAL HIGH (ref 65–105)
Glucose (POC): 172 mg/dL — ABNORMAL HIGH (ref 65–105)

## 2013-06-20 LAB — MAGNESIUM: Magnesium: 1.8 mg/dL (ref 1.6–2.4)

## 2013-06-20 MED ADMIN — sodium chloride (NS) flush 5-10 mL: INTRAVENOUS | @ 18:00:00 | NDC 87701099893

## 2013-06-20 MED ADMIN — acetaminophen (TYLENOL) tablet 650 mg: ORAL | @ 02:00:00 | NDC 50580048790

## 2013-06-20 MED ADMIN — clopidogrel (PLAVIX) tablet 75 mg: ORAL | @ 14:00:00 | NDC 55111019605

## 2013-06-20 MED ADMIN — acetaminophen (TYLENOL) tablet 650 mg: ORAL | @ 19:00:00 | NDC 00904198280

## 2013-06-20 MED ADMIN — metoprolol (LOPRESSOR) tablet 50 mg: ORAL | @ 14:00:00 | NDC 50742010810

## 2013-06-20 MED ADMIN — enoxaparin (LOVENOX) injection 40 mg: SUBCUTANEOUS | @ 16:00:00 | NDC 00075801401

## 2013-06-20 MED ADMIN — insulin lispro (HUMALOG) injection: SUBCUTANEOUS | @ 16:00:00 | NDC 99999192301

## 2013-06-20 MED ADMIN — valsartan (DIOVAN) tablet 80 mg: ORAL | @ 14:00:00 | NDC 00078035834

## 2013-06-20 MED ADMIN — insulin lispro (HUMALOG) injection: SUBCUTANEOUS | @ 13:00:00 | NDC 99999192301

## 2013-06-20 MED ADMIN — quetiapine (SEROQUEL) tablet 12.5 mg: ORAL | @ 05:00:00 | NDC 68180044501

## 2013-06-20 NOTE — Progress Notes (Signed)
NUTRITION    RD note     Pt discussed in discharge rounds.  Diet: dysphagia 1 with nectar thick liquids.  Supplements: sf cib shake with nectar thickener    Pt and family report improvement in po intake to 50% of meals and shake supplements.  Current diet order updated on discharge instructions since pt transferring to rehab today.      PAMELA CAMPBELL, RD

## 2013-06-20 NOTE — Progress Notes (Addendum)
[facility]  [address] [place], [place] [address]  [phone]      Medical Progress Note      NAME: Chelsea Schultz   DOB:  May 04, 1926  MRM:  [id number]    Date/Time: 06/20/2013  11:10 AM         Subjective:     Chief Complaint:  None    No events. No new complaints. No SOB, CP. Some delirium yesterday with oral Seroquel given. Tol diet.        Objective:       Vitals:          Last 24hrs VS reviewed since prior progress note. Most recent are:    Visit Vitals   Item Reading   ??? BP 133/63   ??? Pulse 83   ??? Temp 98.7 ??F (37.1 ??C)   ??? Resp 18   ??? Ht 4\' 7"  (1.397 m)   ??? Wt 52.164 kg (115 lb)   ??? BMI 26.73 kg/m2   ??? SpO2 96%     SpO2 Readings from Last 6 Encounters:   06/20/13 96%   04/03/12 97%   07/01/10 98%    O2 Flow Rate (L/min): 2 l/min     Intake/Output Summary (Last 24 hours) at 06/20/13 1110  Last data filed at 06/19/13 1323   Gross per 24 hour   Intake      0 ml   Output      1 ml   Net     -1 ml          Exam:     Physical Exam:    Gen:  Elderly, frail, in no acute distress  HEENT:  Normocephalic, atraumatic, pink conjunctivae, PERRL, hearing intact to voice, moist mucous membranes  Neck:  Supple, without masses, thyroid non-tender, no elevated JVP  Resp:  Clear breath sounds bilat w/o wheezes, rales, or rhonchi, no accessory mm use  Card:  Regular rate and rhythm, S1, S2. No murmurs, rubs, gallops, or peripheral edema  Abd:  Soft, non-tender, non-distended, normoactive bowel sounds, no organomegaly  Lymph:  No cervical adenopathy  Musc:  No cyanosis or clubbing  Skin:  No rashes or ulcers, skin turgor is good  Neuro:  Cranial nerves III-XII are grossly intact, non-focal exam, follows commands appropriately  Psych:  Alert with poor insight.  Oriented to person     Medications Reviewed: (see below)    Lab Data Reviewed: (see below)  ______________________________________________________________________    Medications:     Current Facility-Administered Medications    Medication Dose Route Frequency   ??? [COMPLETED] quetiapine (SEROQUEL) tablet 12.5 mg  12.5 mg Oral ONCE   ??? metoprolol (LOPRESSOR) tablet 50 mg  50 mg Oral BID   ??? 0.9% sodium chloride infusion  50 mL/hr IntraVENous CONTINUOUS   ??? insulin lispro (HUMALOG) injection   SubCUTAneous AC&HS   ??? diphenhydrAMINE (BENADRYL) capsule 25 mg  25 mg Oral QHS PRN   ??? valsartan (DIOVAN) tablet 80 mg  80 mg Oral DAILY   ??? labetalol (NORMODYNE;TRANDATE) 20 mg/4 mL (5 mg/mL) injection 10 mg  10 mg IntraVENous Q4H PRN   ??? clopidogrel (PLAVIX) tablet 75 mg  75 mg Oral DAILY   ??? enoxaparin (LOVENOX) injection 40 mg  40 mg SubCUTAneous Q24H   ??? sodium chloride (NS) flush 5-10 mL  5-10 mL IntraVENous Q8H   ??? sodium chloride (NS) flush 5-10 mL  5-10 mL IntraVENous PRN   ???  acetaminophen (TYLENOL) tablet 650 mg  650 mg Oral Q4H PRN   ??? acetaminophen (TYLENOL) tablet 650 mg  650 mg Oral Q4H PRN   ??? diphenhydrAMINE (BENADRYL) injection 12.5 mg  12.5 mg IntraVENous Q4H PRN   ??? ondansetron (ZOFRAN) injection 4 mg  4 mg IntraVENous Q8H PRN   ??? glucose chewable tablet 16 g  4 tablet Oral PRN   ??? dextrose (D50W) injection syrg 12.5-25 g  12.5-25 g IntraVENous PRN   ??? glucagon (GLUCAGEN) injection 1 mg  1 mg IntraMUSCular PRN        Lab Review:       Recent Labs      06/18/13   0449   WBC  12.7*   HGB  11.3*   MCV  78.0*   HCT  34.0*   PLT  318       Recent Labs      06/20/13   0611  06/19/13   1050  06/19/13   0600  06/18/13   0449   NA  143   --   139  137   K  3.6   --   3.7  3.4*   CL  109*   --   104  101   CO2  27   --   25  23   BUN  13   --   9  5*   CREA  0.54   --   0.60  0.46   GLU  134*   --   153*  142*   CA  9.6  10.6*  10.5*  10.1   MG  1.8   --   1.6  1.6   PHOS  3.2   --    --    --        Recent Labs      06/20/13   0611   ALB  2.4*     Recent Labs      06/18/13   0449  06/18/13   0030  06/17/13   1805   TROIQ  0.09*  0.09*  0.11*          Assessment:     Principal Problem:    Cerebral thrombosis with cerebral infarction  (06/14/2013)    Active Problems:    Back pain with radiation (10/25/2009)      Vulvar atrophy (06/10/2012)      Overview: E2 started 3x/week 05/2012      Metabolic encephalopathy (06/13/2013)      HTN (hypertension), benign (06/13/2013)      DM type 2 (diabetes mellitus, type 2) (06/13/2013)      SIRS (systemic inflammatory response syndrome) (06/13/2013)      Acute CVA (cerebrovascular accident) (06/14/2013)      Cardiac enzymes elevated (06/16/2013)         Plan:     Acute CVA:   MRI brain showed tiny acute infarct in the right frontal periventricular region. Carotid dopplers neg. TTE LVEF 65% with mild LVH and grade 1 diastolic dysfunction. Dysphagia diet per Speech. PT/OT. Continue Plavix. Patient going to SNF today.     Encephalopathy (06/13/2013):   Due to CVA, UTI. LP neg. EEG neg. Likely having some delirium / sundowning. Prn Seroquel at discharge.     HTN:   BP ok. Continue metoprolol, Diovan.     UTI:   S/p Abx course completion.     DM type 2 (diabetes mellitus, type 2) (06/13/2013):  A1c 6.5.  Controlled w/o metformin, Januvia. Diabetic diet on discharge.     Elevated cardiac enzymes:   Mild. Likely 2/2 demandy/supply mismatch. No WMA on TTE. Seen by Cards and no further workup at this time. Appreciate Cards.     Hypercalcemia:   Mild. PTH normal. Vitamin D pending. Outpatient follow up.     Total time spent with patient: 37 Minutes                  Care Plan discussed with: Patient and Family    Discussed:  Care Plan and D/C Planning     Prophylaxis:  Lovenox    Disposition:  SNF/LTC           Code Status: Full  ___________________________________________________    Attending Physician: Sharmon Revere, MD

## 2013-06-20 NOTE — Progress Notes (Signed)
Spiritual Care Assessment/Progress Notes    Chelsea Schultz [id number]  [id number]    [birth year]  79 y.o.  female    Patient Telephone Number: [phone] (home)   Religious Affiliation: Non denominational   Language: English   Extended Emergency Contact Information  Primary Emergency Contact: Schultz,Chelsea  Home Phone: [phone]  Relation: Child   Patient Active Problem List    Diagnosis Date Noted   ??? Cardiac enzymes elevated 06/16/2013   ??? Acute CVA (cerebrovascular accident) 06/14/2013   ??? Cerebral thrombosis with cerebral infarction 06/14/2013   ??? Metabolic encephalopathy 06/13/2013   ??? HTN (hypertension), benign 06/13/2013   ??? DM type 2 (diabetes mellitus, type 2) 06/13/2013   ??? SIRS (systemic inflammatory response syndrome) 06/13/2013   ??? Vulvar atrophy 06/10/2012   ??? SUI (stress urinary incontinence, female) 06/10/2012   ??? Urgency incontinence 06/10/2012   ??? UTI (urinary tract infection) 06/10/2012   ??? Diabetes 10/25/2009   ??? Back pain with radiation 10/25/2009   ??? HTN (hypertension) 08/22/2009        Date: 06/20/2013       Level of Religious/Spiritual Activity:  []          Involved in faith tradition/spiritual practice    []          Not involved in faith tradition/spiritual practice  [x]          Spiritually oriented    []          Claims no spiritual orientation    []          seeking spiritual identity  []          Feels alienated from religious practice/tradition  []          Feels angry about religious practice/tradition  [x]          Spirituality/religious tradition is a Theatre stage manager for coping at this time.  []          Not able to assess due to medical condition    Services Provided Today:  []          crisis intervention    []          reading Scriptures  [x]          spiritual assessment    [x]          prayer  [x]          empathic listening/emotional support  []          rites and rituals (cite in comments)  []          life review     []          religious support  []          theological development    []          advocacy  []          ethical dialog     []          blessing  []          bereavement support    [x]          support to family  []          anticipatory grief support   []          help with AMD  []          spiritual guidance    []          meditation      Spiritual Care Needs  [x]          Emotional  Support  []          Spiritual/Religious Care  []          Loss/Adjustment  []          Advocacy/Referral /Ethics  []          No needs expressed at this time  []          Other: (note in comments)  Spiritual Care Plan  []          Follow up visits with pt/family  []          Provide materials  []          Schedule sacraments  []          Contact Community Clergy  [x]          Follow up as needed  []          Other: (note in comments)     Comments: Provided pastoral support and prayer for patient and daughter, Chelsea Schultz. Affirmed availability of chaplain's ongoing support as needed.   Clancy Gourd, Chaplain, MDiv, MS  [phone] PRAY [phone])

## 2013-06-20 NOTE — Progress Notes (Signed)
06-20-2013 CARE MANAGEMENT NOTE:  I received a response from [facility] this AM that they do not have a vacancy for the pt and do not know when to anticipate one. One of pt's daughters, Michiel Sites, was in the room with the pt and I informed her of [facility]'s response. She stated she is from [facility] and not familiar with this area and deferred choosing another facility to her sister, Lynden Ang. I called pt's daughter, Jamiyah Dingley at [phone], Ext. [phone], and left her a message informing her of [facility]'s response and requesting her to call me ASAP with other NH choices. Approximately an hour later, [facility] notified me they had made some room changes and will be able to accept the pt today afterall. I again spoke with pt's daughter, Michiel Sites, visiting in the room and informed her of [facility]'s  Romelle Starcher. I also called Lynden Ang, who was again not available, but left her a message that [facility] had made a vacancy for her mother and I had made arrangements for her to be picked up today at 1:30 PM. I sent a referral thru [facility] to [facility] for ambulance transportation. I went back to the pt's room at approximately 12:30 and Jola Babinski was speaking with Lynden Ang on the phone and gave the phone to me. To my surprise, Lynden Ang, was very upset about the arrangements and accused me of not giving her enough notification as she was going to look at other facilities. I told her I thought she would be pleased at this outcome as she had originally told me she wanted her mother back at [facility]. In the end, she was agreeable to this but asked me to change the transport time. I contacted [facility] and changed the pick-up time to 3:30 today. I have informed [facility] of the discharge arrangements, sent them the discharge instructions, discharge summary, today's MD progress note, today's dietician note and today's MAR to them thru [facility]. The RN has been instructed to call report. Sudy Adams, BSW, CM

## 2013-06-20 NOTE — Progress Notes (Addendum)
Discharge instructions given and explained to patients daughter at the bedside. IV and telemetry removed. Allowed time for questions to be answered. Awaiting ambulance to transport patient to snf.     1430: Report called to SNF given to Robin in [facility]. Time allowed for questions.

## 2013-06-20 NOTE — Progress Notes (Signed)
Bedside shift change report given to Mary RN (oncoming nurse) by Jessica RN (offgoing nurse). Report included the following information SBAR, Kardex and MAR.

## 2013-06-20 NOTE — Discharge Summary (Signed)
[facility]  [address] [place], [place]  [address]  [phone]    Physician Discharge Summary     Patient ID:  Chelsea Schultz  [id number]  79 y.o.  [birth year]    PCP: Angeline Slim, MD     Admit date: 06/13/2013    Discharge date and time: 06/20/2013    Admission Diagnoses:   Metabolic encephalopathy    Discharge Diagnoses:    Principal Problem:    Cerebral thrombosis with cerebral infarction (06/14/2013)    Active Problems:    Back pain with radiation (10/25/2009)      Vulvar atrophy (06/10/2012)      Overview: E2 started 3x/week 05/2012      Metabolic encephalopathy (06/13/2013)      HTN (hypertension), benign (06/13/2013)      DM type 2 (diabetes mellitus, type 2) (06/13/2013)      SIRS (systemic inflammatory response syndrome) (06/13/2013)      Acute CVA (cerebrovascular accident) (06/14/2013)      Cardiac enzymes elevated (06/16/2013)         Brief Admission History and Physical:   Chelsea Schultz is a 79 y.o.  African American female who was admitted with sudden onset encephalopathy and found to have an acute CVA and UTI.     Please refer to the history and physical for further information.    Brief Hospital Course:     Acute CVA:   MRI brain showed tiny acute infarct in the right frontal periventricular region. Carotid dopplers neg. TTE LVEF 65% with mild LVH and grade 1 diastolic dysfunction. Dysphagia diet per Speech. PT/OT. Continue Plavix. Patient going to SNF today.     Encephalopathy (06/13/2013):   Due to CVA, UTI. LP neg. EEG neg. Likely having some delirium / sundowning. Prn Seroquel at discharge.     HTN:   BP ok. Continue metoprolol, Diovan.     UTI:   S/p Abx course completion.     DM type 2 (diabetes mellitus, type 2) (06/13/2013):  A1c 6.5. Controlled w/o metformin, Januvia. Diabetic diet on discharge.     Elevated cardiac enzymes:   Mild. Likely 2/2 demandy/supply mismatch. No WMA on TTE. Seen by Cards and no further workup at this time. Appreciate Cards.      Hypercalcemia:   Mild. PTH normal. Vitamin D pending. Outpatient follow up.     Procedures: none    Consults: Cardiology and Neurology    Condition of patient at discharge: good    Discharge Exam:  Please refer to progress note from today.    Disposition: SNF    Patient Instructions:   Current Discharge Medication List      START taking these medications    Details   metoprolol (LOPRESSOR) 50 mg tablet Take 1 tablet by mouth two (2) times a day.  Qty: 60 tablet, Refills: 0      clopidogrel (PLAVIX) 75 mg tablet Take 1 tablet by mouth daily.  Qty: 30 tablet, Refills: 0      quetiapine (SEROQUEL) 25 mg tablet Take 1 tablet by mouth two (2) times daily as needed for Other (agitation).  Qty: 30 tablet, Refills: 0         CONTINUE these medications which have CHANGED    Details   valsartan (DIOVAN) 80 mg tablet Take 1 tablet by mouth daily.  Qty: 30 tablet, Refills: 0         CONTINUE these medications which have NOT CHANGED  Details   pregabalin (LYRICA) 100 mg capsule Take 100 mg by mouth daily.      multivitamin with iron (DAILY MULTI-VITAMINS/IRON) tablet Take 1 tablet by mouth daily.      omeprazole (PRILOSEC) 20 mg capsule Take 1 Cap by mouth daily.  Qty: 90 Cap, Refills: 3    Comments: **Patient requests 90 day supply**      docusate sodium (DOC-Q-LACE) 100 mg capsule Take 1 Cap by mouth daily.  Qty: 30 Cap, Refills: 5      ascorbic acid (VITAMIN C) 250 mg tablet Take 500 mg by mouth two (2) times a day.      calcium-cholecalciferol, D3, (CALCIUM 600 + D) tablet Take 2 Tabs by mouth daily.  Qty: 60 Tab, Refills: 12    Associated Diagnoses: Vitamin D deficiency         STOP taking these medications       metformin (GLUCOPHAGE) 500 mg tablet Comments:   Reason for Stopping:         amlodipine (NORVASC) 5 mg tablet Comments:   Reason for Stopping:         oxybutynin (DITROPAN) 5 mg tablet Comments:   Reason for Stopping:         Aspirin, Buffered 81 mg tab Comments:   Reason for Stopping:         sitagliptin  (JANUVIA) 100 mg tablet Comments:   Reason for Stopping:         furosemide (LASIX) 40 mg tablet Comments:   Reason for Stopping:         hydrocodone-acetaminophen (NORCO) 5-325 mg per tablet Comments:   Reason for Stopping:               No Known Allergies    Activity: Activity as tolerated    Diet: Cardiac Diet and Diabetic Diet    Wound Care: None needed    Follow-up Information    Follow up With Details Comments Contact Info    Angeline Slim, MD In 2 weeks after dc from SNF [address]  [address]  [place] [address]  [phone]             Follow up tests/labs none    Approximate time spent in patient care on day of discharge: 45 minutes    Signed:  Sharmon Revere, MD  06/20/2013  11:24 AM

## 2013-06-21 LAB — CULTURE, CSF W GRAM STAIN
Culture result:: NO GROWTH
Culture result:: NO GROWTH
GRAM STAIN: NONE SEEN
GRAM STAIN: NONE SEEN

## 2013-06-27 LAB — VITAMIN D, 25 HYROXY PANEL
Vit D 25-Hydroxy: 31 ng/mL
Vit D-2 25-Hydroxy: 1.2 ng/mL
Vit D-3 25-Hydroxy: 30 ng/mL

## 2013-08-11 NOTE — Progress Notes (Signed)
Ms.  Chelsea Schultz is a 79 y.o. year old female who had a chief complaint of Other.    HPI:  Chief Complaint   Patient presents with   ??? Other: Plans to go to assisted living center for the day.   She stays with her daughter.   No fever, cough, cough with sputum or blood. No hx of TB or unexplained weight loss. No positive PPD in the past.      need ppd and chest xray.       Past Medical History   Diagnosis Date   ??? Diabetes    ??? Hypertension    ??? Neuropathy    ??? Spinal stenosis    ??? CVA (cerebral infarction)    ??? GERD (gastroesophageal reflux disease)    ??? Nausea & vomiting    ??? Arthritis    ??? Osteopetrosis      Current Outpatient Prescriptions   Medication Sig Dispense   ??? valsartan (DIOVAN) 80 mg tablet Take 1 tablet by mouth daily.  30 tablet   ??? metoprolol (LOPRESSOR) 50 mg tablet Take 1 tablet by mouth two (2) times a day.  60 tablet   ??? clopidogrel (PLAVIX) 75 mg tablet Take 1 tablet by mouth daily.  30 tablet   ??? quetiapine (SEROQUEL) 25 mg tablet Take 1 tablet by mouth two (2) times daily as needed for Other (agitation).  30 tablet   ??? pregabalin (LYRICA) 100 mg capsule Take 100 mg by mouth daily.     ??? multivitamin with iron (DAILY MULTI-VITAMINS/IRON) tablet Take 1 tablet by mouth daily.     ??? omeprazole (PRILOSEC) 20 mg capsule Take 1 Cap by mouth daily.  90 Cap   ??? docusate sodium (DOC-Q-LACE) 100 mg capsule Take 1 Cap by mouth daily.  30 Cap   ??? ascorbic acid (VITAMIN C) 250 mg tablet Take 500 mg by mouth two (2) times a day.     ??? calcium-cholecalciferol, D3, (CALCIUM 600 + D) tablet Take 2 Tabs by mouth daily.  60 Tab     No current facility-administered medications for this visit.       Reviewed PmHx, RxHx, FmHx, SocHx, AllgHx and updated and dated in the chart.      ROS: Negative except for BOLD  General: fever, chills, fatigue  Respiratory: cough, SOB, wheezing  Cardiovascular:  CP, palpitation, DOE, edema   Gastrointestinal: N/V/D, bleeding  Genito-Urinary: dysuria, hematuria   Musculoskeletal: muscle weakness, pain, swelling    OBJECTIVE:   BP 150/64   Pulse 72   Ht 4\' 7"  (1.397 m)   Wt 116 lb 6.4 oz (52.799 kg)   BMI 27.05 kg/m2   SpO2 98%   LMP 10/23/1978  GEN: The patient appears well, alert, oriented x 3, in no distress.   ENT: bilateral TM and canal normal.  Neck supple. No adenopathy or thyromegaly. PERLA.   Lungs: clear bilaterally, good air entry, no wheezes, rhonchi or rales.   Cardiovascular: regular rate and rhythm. S1 and S2 normal, no murmurs,  Abdomen: + BS, soft without tenderness, guarding, rebound, mass or organomegaly.   Extremities: no edema, normal peripheral pulses.   Neurological: normal, gross sensory and motor in tact without focal findings.      Assessment/ Plan:   1. Tuberculosis screening  No sx. Screening for assisted living. No CXR needed if skin test is neg  - AMB POC TUBERCULOSIS, INTRADERMAL (SKIN TEST)        I have discussed  the diagnosis with the patient and the intended plan as seen in the above orders.  The patient has received an after-visit summary and questions were answered concerning future plans.     Medication Side Effects and Warnings were discussed with patient.    Follow-up Disposition:  Return in about 2 days (around 08/13/2013) for PPD read, review day center criteria.      Sharlene Motts, MD

## 2013-08-11 NOTE — Patient Instructions (Signed)
Tuberculin Skin Test: After Your Visit  Your Care Instructions     Tuberculosis (TB) is a bacterial infection that can damage the lungs or other parts of the body. The TB skin test can tell if you have TB bacteria in your body. Many people are exposed to TB and test positive for TB bacteria in their bodies, but they don't get the disease. TB bacteria can stay in your body without making you sick. This is because your immune system can keep TB in check.  Your doctor may want you to have a TB skin test if you have been in close contact with someone who has TB. Or you may need the test if you have symptoms that might be caused by TB, such as a cough that does not go away, a fever, or weight loss. You also may get the test if you are a health care worker.  During the skin test, part of a TB bacterium is injected under your skin. The test will feel like a skin prick. If you have TB bacteria in your body, a firm red bump will form at the shot site within 2 days. If the test shows that you are infected with TB (positive), your doctor probably will order more tests. A TB-positive skin test can't tell when you became infected with TB. And it can't tell whether the infection can be passed to others.  Follow-up care is a key part of your treatment and safety. Be sure to make and go to all appointments, and call your doctor if you are having problems. It's also a good idea to know your test results and keep a list of the medicines you take.  How can you care for yourself at home?  ?? Do not scratch the test site. Scratching it may cause redness or swelling. This could affect the test results.  ?? To ease itching, put a cold washcloth on the site. Then pat the site dry.  ?? Do not cover the test site with a bandage or other dressing.  ?? Go back to your doctor's office or hospital to have the test read on the follow-up date. This must be done between 48 and 72 hours after you get the shot.  When should you call for help?  Watch  closely for changes in your health, and be sure to contact your doctor if:  ?? You did not have your TB skin test checked by your doctor.  ?? You have a fever or swelling in your arm.  ?? You do not get better as expected.   Where can you learn more?   Go to http://www.healthwise.net/[facility]  Enter J474 in the search box to learn more about "Tuberculin Skin Test: After Your Visit."   ?? 2006-2014 [employer]wise[employer]. Care instructions adapted under license by [facility] (which disclaims liability or warranty for this information). This care instruction is for use with your licensed healthcare professional. If you have questions about a medical condition or this instruction, always ask your healthcare professional. Healthwise, Incorporated disclaims any warranty or liability for your use of this information.  Content Version: 10.1.311062; Current as of: July 12, 2012

## 2013-08-11 NOTE — Progress Notes (Signed)
Chief Complaint   Patient presents with   ??? Other     need ppd and chest xray.

## 2013-08-13 LAB — AMB POC TUBERCULOSIS, INTRADERMAL (SKIN TEST)
PPD: NEGATIVE Negative
mm Induration: 3 mm

## 2014-06-11 LAB — AMB EXT LDL-C: LDL-C, External: 68

## 2014-06-11 LAB — AMB EXT HGBA1C: Hemoglobin A1c, External: 6.4

## 2015-01-09 LAB — AMB EXT HGBA1C: Hemoglobin A1c, External: 6.4

## 2015-01-12 LAB — AMB EXT HGBA1C: Hemoglobin A1c, External: 6.4

## 2015-01-12 LAB — AMB EXT LDL-C: LDL-C, External: 72

## 2015-01-12 LAB — AMB EXT CREATININE: Creatinine, External: 0.71

## 2015-03-11 ENCOUNTER — Ambulatory Visit: Admit: 2015-03-11 | Discharge: 2015-03-11 | Payer: MEDICARE | Attending: Family Medicine | Primary: Family Medicine

## 2015-03-11 DIAGNOSIS — I10 Essential (primary) hypertension: Secondary | ICD-10-CM

## 2015-03-11 MED ORDER — METFORMIN 500 MG TAB
500 mg | ORAL_TABLET | Freq: Two times a day (BID) | ORAL | Status: DC
Start: 2015-03-11 — End: 2015-10-07

## 2015-03-11 MED ORDER — NITROFURANTOIN (25% MACROCRYSTAL FORM) 100 MG CAP
100 mg | ORAL_CAPSULE | ORAL | Status: DC
Start: 2015-03-11 — End: 2016-01-11

## 2015-03-11 MED ORDER — JANUVIA 100 MG TABLET
100 mg | ORAL_TABLET | Freq: Every day | ORAL | Status: DC
Start: 2015-03-11 — End: 2015-08-23

## 2015-03-11 MED ORDER — AMLODIPINE 10 MG TAB
10 mg | ORAL_TABLET | Freq: Every day | ORAL | Status: DC
Start: 2015-03-11 — End: 2016-08-09

## 2015-03-11 MED ORDER — PREGABALIN 100 MG CAP
100 mg | ORAL_CAPSULE | Freq: Every day | ORAL | Status: DC
Start: 2015-03-11 — End: 2016-03-06

## 2015-03-11 MED ORDER — CANDESARTAN 16 MG TAB
16 mg | ORAL_TABLET | Freq: Every day | ORAL | Status: DC
Start: 2015-03-11 — End: 2015-06-05

## 2015-03-11 MED ORDER — CLOPIDOGREL 75 MG TAB
75 mg | ORAL_TABLET | Freq: Every day | ORAL | Status: DC
Start: 2015-03-11 — End: 2016-02-22

## 2015-03-11 NOTE — Progress Notes (Signed)
Chief Complaint   Patient presents with   ??? Establish Care   ??? Medication Refill     See's urologist late June  Went to urgent care 03/09/15 for uti   Needs med refills

## 2015-03-11 NOTE — Progress Notes (Signed)
Subjective  Chelsea Schultz is an 81 y.o. female who presents for medication refills and to establish care.     Transferring care from Dr. Charissa Bash at [facility] [phone]), states her PCP left and is no longer at the practice. Medical records were faxed and latest labs done just two month prior were reviewed. Chronic medical problems include diabetes, hypertension, hyperlipidemia, neuropathy, history of stroke, and recurrent UTI's on chronic antibiotic prophylaxis.     She has seen endocrinology and urology in the past in the past 6 months.     Denies any complaint today feels well.    Needs tdap and pneumococcal vaccine but patient refusing all immunizations.     No longer getting pap smear, mammogram, or colonoscopy screenings - stopped at age 66.       Allergies - reviewed:   No Known Allergies      Medications - reviewed:   Current Outpatient Prescriptions   Medication Sig   ??? atorvastatin (LIPITOR) 10 mg tablet    ??? KLOR-CON 10 10 mEq tablet Take 10 mEq by mouth.   ??? trospium (SANCTURA) 20 mg tablet Take 20 mg by mouth Before breakfast, lunch, and dinner. Indications: BLADDER HYPERACTIVITY   ??? NATURE-THROID 16.25 mg tab    ??? doxycycline (MONODOX) 100 mg capsule    ??? nizatidine (AXID) 150 mg cap capsule    ??? polyethylene glycol (MIRALAX) 17 gram/dose powder    ??? ascorbic acid (VITAMIN C) 1,000 mg tablet Take  by mouth.   ??? cranberry 500 mg capsule Take 500 mg by mouth daily.   ??? furosemide (LASIX) 20 mg tablet Take 20 mg by mouth as needed. Indications: EDEMA   ??? pregabalin (LYRICA) 100 mg capsule Take 1 Cap by mouth daily. Max Daily Amount: 100 mg. Indications: DIABETIC PERIPHERAL NEUROPATHY, NEUROPATHIC PAIN ASSOCIATED WITH SPINAL CORD INJURY   ??? amLODIPine (NORVASC) 10 mg tablet Take 1 Tab by mouth daily. Indications: HYPERTENSION   ??? candesartan (ATACAND) 16 mg tablet Take 1 Tab by mouth daily.   ??? nitrofurantoin, macrocrystal-monohydrate, (MACROBID) 100 mg capsule Take  1 Cap by mouth two (2) times a week.   ??? JANUVIA 100 mg tablet Take 1 Tab by mouth daily. Indications: TYPE 2 DIABETES MELLITUS   ??? metFORMIN (GLUCOPHAGE) 500 mg tablet Take 1 Tab by mouth two (2) times daily (with meals). Indications: TYPE 2 DIABETES MELLITUS   ??? clopidogrel (PLAVIX) 75 mg tablet Take 1 Tab by mouth daily. Indications: CEREBRAL THROMBOEMBOLISM PREVENTION   ??? DOC-Q-LACE 100 mg capsule TAKE ONE CAPSULE BY MOUTH DAILY   ??? multivitamin with iron (DAILY MULTI-VITAMINS/IRON) tablet Take 1 tablet by mouth daily.   ??? ascorbic acid (VITAMIN C) 250 mg tablet Take 500 mg by mouth two (2) times a day.   ??? calcium-cholecalciferol, D3, (CALCIUM 600 + D) tablet Take 2 Tabs by mouth daily.     No current facility-administered medications for this visit.         Past Medical History - reviewed:  Past Medical History   Diagnosis Date   ??? Diabetes (HCC) 01/09/2015     Hemoglobin A1C 6.4 (per records, done at outside facility)   ??? Hypertension    ??? Neuropathy    ??? Spinal stenosis    ??? CVA (cerebral infarction)    ??? GERD (gastroesophageal reflux disease)    ??? Arthritis    ??? Osteopetrosis    ??? HLD (hyperlipidemia) 01/09/2015     Total Chol 141,  Tri 49, HDL 59, LDL 72   ??? History of recurrent UTIs      on chronic antibiotic prophylaxis, followed by urology         Past Surgical History - reviewed:   Past Surgical History   Procedure Laterality Date   ??? Hx orthopaedic       Lumbar laminectomy   ??? Hx orthopaedic       Lumbar fusion         Social History - reviewed:  History     Social History   ??? Marital Status: WIDOWED     Spouse Name: N/A   ??? Number of Children: N/A   ??? Years of Education: N/A     Occupational History   ??? Not on file.     Social History Main Topics   ??? Smoking status: Never Smoker    ??? Smokeless tobacco: Never Used   ??? Alcohol Use: No   ??? Drug Use: No   ??? Sexual Activity: No     Other Topics Concern   ??? Not on file     Social History Narrative         Family History - reviewed:  Family History    Problem Relation Age of Onset   ??? Diabetes Mother    ??? Stroke Mother    ??? Hypertension Mother    ??? Hypertension Father    ??? Stroke Father    ??? Cancer Sister    ??? Stroke Sister      Immunizations - reviewed:   Immunization History   Administered Date(s) Administered   ??? TB Skin Test (PPD) Intradermal 08/11/2013     ROS  CONSTITUTIONAL: Denies: fever, chills, fatigue, weight loss  EYES: Denies: diplopia  ENT: Denies: sore throat, nasal congestion  CARDIOVASCULAR: Denies: chest pain, dyspnea on exertion, edema, palpitations  RESPIRATORY: Denies: cough, shortness of breath  ENDOCRINE: Denies: polydipsia/polyuria  GI: Denies: abdominal pain, nausea, vomiting, diarrhea, constipation, blood in stool  GU: Denies: dysuria, frequency/urgency, hematuria  NEURO: Denies: dizzy/vertigo, headache, focal weakness, numbness/tingling  MUSCULOSKELETAL: Denies: muscle weakness    Physical Exam  BP 162/83 mmHg   Pulse 88   Temp(Src) 97.8 ??F (36.6 ??C) (Oral)   Resp 16   Ht 4\' 7"  (1.397 m)   Wt 120 lb 2 oz (54.488 kg)   BMI 27.92 kg/m2   SpO2 95%   LMP 10/23/1978    General appearance - alert, well appearing, and in no distress  Neck - supple, no significant adenopathy, no thyroid masses or nodules   Chest - clear to auscultation, no wheezes, rales or rhonchi, symmetric air entry  Heart - normal rate, regular rhythm, normal S1, S2, no murmurs, rubs, clicks or gallops  Abdomen - soft, nontender, nondistended, no masses or organomegaly  Neurological - alert, oriented, normal speech, no focal findings or movement disorder noted  Musculoskeletal - no joint tenderness, deformity or swelling  Extremities - peripheral pulses normal, no pedal edema, no clubbing or cyanosis  Skin - normal coloration and turgor, no rashes, no suspicious skin lesions noted      Assessment/Plan    ICD-10-CM ICD-9-CM    1. Essential hypertension, hypertension with unspecified goal I10 401.9 amLODIPine (NORVASC) 10 mg tablet      candesartan (ATACAND) 16 mg tablet    2. Diabetes mellitus type 2, controlled (HCC) E11.9 250.00 JANUVIA 100 mg tablet      metFORMIN (GLUCOPHAGE) 500 mg tablet   3. Hyperlipidemia, unspecified hyperlipidemia  type E78.5 272.4 atorvastatin (LIPITOR) 10 mg tablet   4. Neuropathy G62.9 355.9 pregabalin (LYRICA) 100 mg capsule   5. Refuses tetanus, diphtheria, and acellular pertussis (TDaP) vaccination Z28.21 V64.06    6. Refused pneumococcal vaccination Z78.9 V49.89    7. History of CVA (cerebrovascular accident) Z86.73 V12.54 clopidogrel (PLAVIX) 75 mg tablet   8. History of recurrent UTI (urinary tract infection) Z87.440 V13.02 nitrofurantoin, macrocrystal-monohydrate, (MACROBID) 100 mg capsule       Marylee FlorasBlanche was seen today for medication refills and to establish care. Outside records and latest labs reviewed. Chronic medical problems seem well controlled on current meds. Scripts refilled.     Diagnoses and all orders for this visit:    Essential hypertension, hypertension with unspecified goal  Orders:  -     amLODIPine (NORVASC) 10 mg tablet; Take 1 Tab by mouth daily. Indications: HYPERTENSION  -     candesartan (ATACAND) 16 mg tablet; Take 1 Tab by mouth daily.    Diabetes mellitus type 2, controlled (HCC)  Orders:  -     JANUVIA 100 mg tablet; Take 1 Tab by mouth daily. Indications: TYPE 2 DIABETES MELLITUS  -     metFORMIN (GLUCOPHAGE) 500 mg tablet; Take 1 Tab by mouth two (2) times daily (with meals). Indications: TYPE 2 DIABETES MELLITUS    Hyperlipidemia, unspecified hyperlipidemia type    Neuropathy  Orders:  -     pregabalin (LYRICA) 100 mg capsule; Take 1 Cap by mouth daily. Max Daily Amount: 100 mg. Indications: DIABETIC PERIPHERAL NEUROPATHY, NEUROPATHIC PAIN ASSOCIATED WITH SPINAL CORD INJURY    Refuses tetanus, diphtheria, and acellular pertussis (TDaP) vaccination    Refused pneumococcal vaccination    History of CVA (cerebrovascular accident)  Orders:  -     clopidogrel (PLAVIX) 75 mg tablet; Take 1 Tab by mouth daily.  Indications: CEREBRAL THROMBOEMBOLISM PREVENTION    History of recurrent UTI (urinary tract infection)  Orders:  -     nitrofurantoin, macrocrystal-monohydrate, (MACROBID) 100 mg capsule; Take 1 Cap by mouth two (2) times a week.        Follow-up Disposition:  Return in about 3 months (around 06/11/2015) for blood pressure and diabetes . Will get labs at next visit. Just had blood work done in March       I have discussed the diagnosis with the patient and the intended plan as seen in the above orders. Patient verbalized understanding of the plan and agrees with the plan. The patient has received an after-visit summary and questions were answered concerning future plans.  I have discussed medication side effects and warnings with the patient as well. Informed patient to return to the office if new symptoms arise.        Selina CooleyMaria C Oluwaseun Bruyere, MD  Family Medicine Resident

## 2015-03-11 NOTE — Progress Notes (Deleted)
Subjective  Chelsea Schultz is an 81 y.o. female who presents for medication refills and to establish care     Last [facility] doctor [facility] [phone]) she is no longer in practice     Allergies - reviewed:   No Known Allergies      Medications - reviewed:   Current Outpatient Prescriptions   Medication Sig   ??? atorvastatin (LIPITOR) 10 mg tablet    ??? KLOR-CON 10 10 mEq tablet Take 10 mEq by mouth.   ??? trospium (SANCTURA) 20 mg tablet Take 20 mg by mouth Before breakfast, lunch, and dinner. Indications: BLADDER HYPERACTIVITY   ??? NATURE-THROID 16.25 mg tab    ??? doxycycline (MONODOX) 100 mg capsule    ??? nizatidine (AXID) 150 mg cap capsule    ??? polyethylene glycol (MIRALAX) 17 gram/dose powder    ??? ascorbic acid (VITAMIN C) 1,000 mg tablet Take  by mouth.   ??? cranberry 500 mg capsule Take 500 mg by mouth daily.   ??? furosemide (LASIX) 20 mg tablet Take 20 mg by mouth as needed. Indications: EDEMA   ??? pregabalin (LYRICA) 100 mg capsule Take 1 Cap by mouth daily. Max Daily Amount: 100 mg. Indications: DIABETIC PERIPHERAL NEUROPATHY, NEUROPATHIC PAIN ASSOCIATED WITH SPINAL CORD INJURY   ??? amLODIPine (NORVASC) 10 mg tablet Take 1 Tab by mouth daily. Indications: HYPERTENSION   ??? candesartan (ATACAND) 16 mg tablet Take 1 Tab by mouth daily.   ??? nitrofurantoin, macrocrystal-monohydrate, (MACROBID) 100 mg capsule Take 1 Cap by mouth two (2) times a week.   ??? JANUVIA 100 mg tablet Take 1 Tab by mouth daily. Indications: TYPE 2 DIABETES MELLITUS   ??? metFORMIN (GLUCOPHAGE) 500 mg tablet Take 1 Tab by mouth two (2) times daily (with meals). Indications: TYPE 2 DIABETES MELLITUS   ??? clopidogrel (PLAVIX) 75 mg tablet Take 1 Tab by mouth daily. Indications: CEREBRAL THROMBOEMBOLISM PREVENTION   ??? DOC-Q-LACE 100 mg capsule TAKE ONE CAPSULE BY MOUTH DAILY   ??? multivitamin with iron (DAILY MULTI-VITAMINS/IRON) tablet Take 1 tablet by mouth daily.    ??? ascorbic acid (VITAMIN C) 250 mg tablet Take 500 mg by mouth two (2) times a day.   ??? calcium-cholecalciferol, D3, (CALCIUM 600 + D) tablet Take 2 Tabs by mouth daily.     No current facility-administered medications for this visit.         Past Medical History - reviewed:  Past Medical History   Diagnosis Date   ??? Diabetes (HCC)    ??? Hypertension    ??? Neuropathy    ??? Spinal stenosis    ??? CVA (cerebral infarction)    ??? GERD (gastroesophageal reflux disease)    ??? Nausea & vomiting    ??? Arthritis    ??? Osteopetrosis          Past Surgical History - reviewed:   Past Surgical History   Procedure Laterality Date   ??? Hx orthopaedic       Lumbar laminectomy   ??? Hx orthopaedic       Lumbar fusion         Social History - reviewed:  History     Social History   ??? Marital Status: WIDOWED     Spouse Name: N/A   ??? Number of Children: N/A   ??? Years of Education: N/A     Occupational History   ??? Not on file.     Social History Main  Topics   ??? Smoking status: Never Smoker    ??? Smokeless tobacco: Never Used   ??? Alcohol Use: No   ??? Drug Use: No   ??? Sexual Activity: No     Other Topics Concern   ??? Not on file     Social History Narrative         Family History - reviewed:  Family History   Problem Relation Age of Onset   ??? Diabetes Mother    ??? Stroke Mother    ??? Hypertension Mother    ??? Hypertension Father    ??? Stroke Father    ??? Cancer Sister    ??? Stroke Sister          Immunizations - reviewed:   Immunization History   Administered Date(s) Administered   ??? TB Skin Test (PPD) Intradermal 08/11/2013     Flu: ***      ROS  CONSTITUTIONAL: {ros constitutional ed:313296}  EYES: {ros eyes UJ:811914}  ENT: {ros ent NW:295621}  CARDIOVASCULAR: {ros cv ed:313300}  RESPIRATORY: {ros resp ed:313301}  ENDOCRINE: {ros endo ed:313302}  GI: {ros gi ed:313304}  GU: {ros gu ed:313305}  NEURO: {ros neuro ed:313306}  MUSCULOSKELETAL: {ros musculoskeletal ed:313307}  SKIN: {ros skin ed:313308}  PSYCH: {ros psych HY:865784}      Physical Exam   BP 162/83 mmHg   Pulse 88   Temp(Src) 97.8 ??F (36.6 ??C) (Oral)   Resp 16   Ht  (1.397 m)   Wt 120 lb 2 oz (54.488 kg)   BMI 27.92 kg/m2   SpO2 95%   LMP 10/23/1978    General appearance - {appearance:315021::"alert, well appearing, and in no distress"}  Eyes - {pe eye exam abnormal findings:315209::"pupils equal and reactive, extraocular eye movements intact"}  Ears - {pe ears normal/abnormal:315207::"bilateral TM's and external ear canals normal"}  Nose - {pe nose:315325::"normal and patent, no erythema, discharge or polyps"}  Mouth - {mouth:315326::"mucous membranes moist, pharynx normal without lesions"}  Neck - {pe neck:315327::"supple, no significant adenopathy"}  Chest - {chest:315033::"clear to auscultation, no wheezes, rales or rhonchi, symmetric air entry"}  Heart - {heart exam:315510::"normal rate, regular rhythm, normal S1, S2, no murmurs, rubs, clicks or gallops"}  Abdomen - {abd exam:315920::"soft, nontender, nondistended, no masses or organomegaly"}  Neurological - {neuro:315902::"alert, oriented, normal speech, no focal findings or movement disorder noted"}  Musculoskeletal - {msk exam:315950::"no joint tenderness, deformity or swelling"}  Extremities - {extremities:315109::"peripheral pulses normal, no pedal edema, no clubbing or cyanosis"}  Skin - {skin exam:315960::"normal coloration and turgor, no rashes, no suspicious skin lesions noted"}      Assessment/Plan    ICD-10-CM ICD-9-CM    1. Essential hypertension, hypertension with unspecified goal I10 401.9    2. Neuropathy G62.9 355.9    3. Diabetes mellitus type 2, controlled (HCC) E11.9 250.00    4. Refuses tetanus, diphtheria, and acellular pertussis (TDaP) vaccination Z28.21 V64.06    5. Refused pneumococcal vaccination Z78.9 V49.89          Follow-up Disposition:  Return in about 3 months (around 06/11/2015) for blood pressure and diabetes .      I have discussed the diagnosis with the patient and the intended plan as  seen in the above orders. Patient verbalized understanding of the plan and agrees with the plan. The patient has received an after-visit summary and questions were answered concerning future plans.  I have discussed medication side effects and warnings with the patient as well. Informed patient to return to the office  if new symptoms arise.        Selina CooleyMaria C Swayze Pries, MD  Family Medicine Resident

## 2015-03-14 NOTE — Progress Notes (Signed)
I discussed the findings, assessment and plan with the resident and agree with the resident's findings and plan as documented in the resident's note.

## 2015-04-12 MED ORDER — FUROSEMIDE 20 MG TAB
20 mg | ORAL_TABLET | ORAL | Status: DC | PRN
Start: 2015-04-12 — End: 2017-08-13

## 2015-04-12 MED ORDER — NATURE-THROID 16.25 MG TABLET
16.25 mg | ORAL_TABLET | Freq: Every day | ORAL | Status: DC
Start: 2015-04-12 — End: 2015-05-17

## 2015-04-12 NOTE — Telephone Encounter (Signed)
Per fax   Requested Prescriptions     Pending Prescriptions Disp Refills   ??? furosemide (LASIX) 20 mg tablet       Sig: Take 1 Tab by mouth as needed. Indications: EDEMA   ??? NATURE-THROID 16.25 mg tab  2

## 2015-04-13 MED ORDER — POLYETHYLENE GLYCOL 3350 100 % ORAL POWDER
17 gram/dose | Freq: Every day | ORAL | Status: DC
Start: 2015-04-13 — End: 2015-09-20

## 2015-04-13 NOTE — Telephone Encounter (Signed)
Per fax   Requested Prescriptions     Pending Prescriptions Disp Refills   ??? polyethylene glycol (MIRALAX) 17 gram/dose powder  2

## 2015-05-18 MED ORDER — NATURE-THROID 16.25 MG TABLET
16.25 mg | ORAL_TABLET | ORAL | Status: DC
Start: 2015-05-18 — End: 2015-05-19

## 2015-05-19 ENCOUNTER — Ambulatory Visit: Admit: 2015-05-19 | Discharge: 2015-05-19 | Payer: MEDICARE | Primary: Family Medicine

## 2015-05-19 DIAGNOSIS — M25552 Pain in left hip: Secondary | ICD-10-CM

## 2015-05-19 MED ORDER — TRAMADOL 50 MG TAB
50 mg | ORAL_TABLET | Freq: Four times a day (QID) | ORAL | Status: DC | PRN
Start: 2015-05-19 — End: 2016-03-06

## 2015-05-19 NOTE — Progress Notes (Signed)
Chelsea Schultz is a 81 y.o.  female  Issues discussed today include:    New patient to me    She was in the hsoptial in [place] recently and was diagnosed with MSK low back pain and DDD with XR and MRI.  Home PT was suggested by them.  Family does not think they could get her to a PT center for rehab.      PMH:  DDD s/p lumbar lam in 2013 (they are unsure of the name of surgeon).  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  We also need the name of her back surgeon    Other problems include:  Patient Active Problem List   Diagnosis Code   ??? HTN (hypertension) I10   ??? Diabetes (HCC) E11.9   ??? 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 Vulvar atrophy N90.5   ??? SUI (stress urinary incontinence, female) N39.3   ??? Urgency incontinence N39.41   ??? UTI (urinary tract infection) N39.0   ??? Metabolic encephalopathy G93.41   ??? HTN (hypertension), benign I10   ??? DM type 2 (diabetes mellitus, type 2) (HCC) E11.9   ??? SIRS (systemic inflammatory response syndrome) (HCC) R65.10   ??? Acute CVA (cerebrovascular accident) (HCC) I63.9   ??? Cerebral thrombosis with cerebral infarction (HCC) I63.30   ??? Cardiac enzymes elevated R74.8       Medications:  Current Outpatient Prescriptions   Medication Sig Dispense Refill   ??? traMADol (ULTRAM) 50 mg tablet Take 1 Tab by mouth every six (6) hours as needed for Pain. Max Daily Amount: 200 mg. 60 Tab 1   ??? polyethylene glycol (MIRALAX) 17 gram/dose powder Take 17 g by mouth daily. 850 g 2   ??? furosemide (LASIX) 20 mg tablet Take 1 Tab by mouth as needed. Indications: EDEMA 30 Tab 3   ??? atorvastatin (LIPITOR) 10 mg tablet   3   ??? KLOR-CON 10 10 mEq tablet Take 10 mEq by mouth.  3   ??? trospium (SANCTURA) 20 mg tablet Take 20 mg by mouth Before breakfast, lunch, and dinner. Indications: BLADDER HYPERACTIVITY  0   ??? nizatidine (AXID) 150 mg cap capsule   2    ??? ascorbic acid (VITAMIN C) 1,000 mg tablet Take  by mouth.     ??? cranberry 500 mg capsule Take 500 mg by mouth daily.     ??? pregabalin (LYRICA) 100 mg capsule Take 1 Cap by mouth daily. Max Daily Amount: 100 mg. Indications: DIABETIC PERIPHERAL NEUROPATHY, NEUROPATHIC PAIN ASSOCIATED WITH SPINAL CORD INJURY 90 Cap 3   ??? amLODIPine (NORVASC) 10 mg tablet Take 1 Tab by mouth daily. Indications: HYPERTENSION 90 Tab 3   ??? candesartan (ATACAND) 16 mg tablet Take 1 Tab by mouth daily. 90 Tab 0   ??? nitrofurantoin, macrocrystal-monohydrate, (MACROBID) 100 mg capsule Take 1 Cap by mouth two (2) times a week. 30 Cap 3   ??? JANUVIA 100 mg tablet Take 1 Tab by mouth daily. Indications: TYPE 2 DIABETES MELLITUS 90 Tab 1   ??? metFORMIN (GLUCOPHAGE) 500 mg tablet Take 1 Tab by mouth two (2) times daily (with meals). Indications: TYPE 2 DIABETES MELLITUS 90 Tab 3   ??? clopidogrel (PLAVIX) 75 mg tablet Take 1 Tab by mouth daily. Indications: CEREBRAL THROMBOEMBOLISM PREVENTION 90 Tab  1   ??? DOC-Q-LACE 100 mg capsule TAKE ONE CAPSULE BY MOUTH DAILY 30 capsule prn   ??? multivitamin with iron (DAILY MULTI-VITAMINS/IRON) tablet Take 1 tablet by mouth daily.     ??? ascorbic acid (VITAMIN C) 250 mg tablet Take 500 mg by mouth two (2) times a day.     ??? calcium-cholecalciferol, D3, (CALCIUM 600 + D) tablet Take 2 Tabs by mouth daily. 60 Tab 12       Allergies:  No Known Allergies    LMP:  Patient's last menstrual period was 10/23/1978.    History     Social History   ??? Marital Status: WIDOWED     Spouse Name: N/A   ??? Number of Children: N/A   ??? Years of Education: N/A     Occupational History   ??? Not on file.     Social History Main Topics   ??? Smoking status: Never Smoker    ??? Smokeless tobacco: Never Used   ??? Alcohol Use: No   ??? Drug Use: No   ??? Sexual Activity: No     Other Topics Concern   ??? Not on file     Social History Narrative         Family History   Problem Relation Age of Onset   ??? Diabetes Mother    ??? Stroke Mother     ??? Hypertension Mother    ??? Hypertension Father    ??? Stroke Father    ??? Cancer Sister    ??? Stroke Sister          Meaningful use:  done    ROS:  The patient denies:  Headches/Chest Pain/SOB/fevers  Any positive ROS include: no falls    Patient's last menstrual period was 10/23/1978.    Physical Exam  BP 124/76 mmHg   Pulse 98   Temp(Src) 98.8 ??F (37.1 ??C) (Oral)   Resp 16   Ht    Wt    SpO2 96%   LMP 10/23/1978  BP Readings from Last 3 Encounters:   05/19/15 124/76   03/11/15 162/83   08/11/13 150/64       Constitutional:  Appears well in wheelchair,  No Acute Distress, Vitals noted  Psychiatric:   Affect normal, Alert and Oriented to person/place/time    Eyes:   Pupils equally round and reactive, EOMI, conjunctiva clear, eyelids normal  ENT:   External ears and nose normal/lips, teeth=OK/gums normal, TMs and Oropharynx normal  Neck:   general inspection and Thyroid normal.  No abnormal cervical or supraclavicular nodes    Lungs:   clear to auscultation, good respiratory effort  Heart:   Auscultation normal.  Regular rhythm.  No cardiac murmurs.  No carotid bruits or palpable thrills  Chest wall normal    Abdominal exam:   normal.  Liver and spleen normal.  No bruits/masses/tenderness    Extremities:   without edema, good peripheral pulses  Skin:   Warm to palpation, without rashes, bruising, or suspicious lesions     MSK:  Tender left TB area  Seems to have full ROM of joints but pain with ROM left hip    I cannot detect left sided weakness        Assessment:    Patient Active Problem List   Diagnosis Code   ??? HTN (hypertension) I10   ??? Diabetes (HCC) E11.9   ??? 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 Vulvar atrophy N90.5   ??? SUI (  stress urinary incontinence, female) N39.3   ??? Urgency incontinence N39.41   ??? UTI (urinary tract infection) N39.0   ??? Metabolic encephalopathy G93.41   ??? HTN (hypertension), benign I10   ??? DM type 2 (diabetes mellitus, type 2) (HCC) E11.9    ??? SIRS (systemic inflammatory response syndrome) (HCC) R65.10   ??? Acute CVA (cerebrovascular accident) (HCC) I63.9   ??? Cerebral thrombosis with cerebral infarction (HCC) I63.30   ??? Cardiac enzymes elevated R74.8       Today's diagnoses are:    ICD-10-CM ICD-9-CM    1. Arthralgia of left hip M25.552 719.45 traMADol (ULTRAM) 50 mg tablet      REFERRAL TO HOME HEALTH   2. Type 2 diabetes mellitus without complication (HCC) E11.9 250.00 HEMOGLOBIN A1C WITH EAG      LIPID PANEL      METABOLIC PANEL, BASIC      ALT      AMB POC URINE, MICROALBUMIN, SEMIQUANT (3 RESULTS)   3. Risk for falls Z91.81 V15.88 REFERRAL TO HOME HEALTH   4. DDD (degenerative disc disease), lumbar M51.36 722.52 REFERRAL TO HOME HEALTH   5. Weakness of left side of body M62.81 728.87 REFERRAL TO HOME HEALTH       Plan:  Orders Placed This Encounter   ??? HEMOGLOBIN A1C WITH EAG   ??? LIPID PANEL   ??? METABOLIC PANEL, BASIC   ??? ALT   ??? REFERRAL TO HOME HEALTH     Referral Priority:  Routine     Referral Type:  Home Health Evaluation     Referral Reason:  Continuity of Care   ??? AMB POC URINE, MICROALBUMIN, SEMIQUANT (3 RESULTS)   ??? traMADol (ULTRAM) 50 mg tablet     Sig: Take 1 Tab by mouth every six (6) hours as needed for Pain. Max Daily Amount: 200 mg.     Dispense:  60 Tab     Refill:  1       See patient instructions  Patient Instructions   WELLNESS exam soon with your primary physician    Fasting labs soon    Sign release so we can get records from [facility]  We also need the name of her back surgeon ( I would suggest seeing back specialist again soon)    Home health evaluation for home safety and fall prevention and home PT (but going to a physical therapy center may be better fro strengthening when possible)    Switch to tramadol for pain    Consider seeing pain specialist if pain becomes a chronic issue    For prevention of constipation, take Senekot-S, an over-the-counter stool  softener.  Take 2 pills daily at bedtime with a large glass of water.  You may also try Milk of Magnesia, 2 tablespoons at bedtime for a few days if your are constipated.      Follow up if you symptoms persist.      Please call Minnie to help arrange and authorize any tests and/or referrals.  Her # is [phone]             refresh note:  done    AVS Printed:  done      The majority of today's visit was counseling or coordination of care 25 minutes.     Total face to face time:  25  Time spent counseling:  20  Subject:  See diagnoses.

## 2015-05-19 NOTE — Telephone Encounter (Signed)
Please review the following message.    Thank You

## 2015-05-19 NOTE — Patient Instructions (Addendum)
WELLNESS exam soon with your primary physician    Fasting labs soon    Sign release so we can get records from [facility]  We also need the name of her back surgeon ( I would suggest seeing back specialist again soon)    Home health evaluation for home safety and fall prevention and home PT (but going to a physical therapy center may be better fro strengthening when possible)    Switch to tramadol for pain    Consider seeing pain specialist if pain becomes a chronic issue    For prevention of constipation, take Senekot-S, an over-the-counter stool softener.  Take 2 pills daily at bedtime with a large glass of water.  You may also try Milk of Magnesia, 2 tablespoons at bedtime for a few days if your are constipated.      Follow up if you symptoms persist.      Please call Minnie to help arrange and authorize any tests and/or referrals.  Her # is [phone]

## 2015-05-19 NOTE — Telephone Encounter (Signed)
-----   Message from Holley Raring sent at 05/19/2015 11:08 AM EDT -----  Regarding: [facility]/telephone  Pt's daughter Teniya Filter stated her mother went to [facility] ER in [facility] on 05/16/15 for hip and back pain.They gave her an order for physical therapy and she would like to know if her mother would need another order from her doctor.Ms Kasik phone number is [phone].

## 2015-05-20 NOTE — Telephone Encounter (Signed)
Patient appointment 05/24/2015 with [facility] anytime after 1 pm,  fax number [phone] office number [phone].

## 2015-05-20 NOTE — Telephone Encounter (Signed)
Voice mail left for Dr. Jean RosenthalJackson ,      Tillman AbideLacy Wells [facility], states she received a call from patient's daughter about order for physical Therapy. Asking to be contacted at [phone]      thanks

## 2015-05-21 NOTE — Telephone Encounter (Signed)
See documentation under referral tab....

## 2015-05-24 NOTE — Telephone Encounter (Signed)
[facility], Misty Stanley, said they rec'd an order on July 29 and they cannot admit her today as the patient is unable to give her medical information.      Patient is safe there with the family but.  She will need to call the daughter tonight to when she will be available to speak with her.      Hope to have patient admit by Wednesday as long as the daughter can work with this agency.  There are issues due to the daughter';s work schedule.

## 2015-05-28 NOTE — Telephone Encounter (Signed)
Chelsea Schultz with [facility] called stating that she needs a verbal order for occupational therapy. She can be reached at [phone].

## 2015-05-31 ENCOUNTER — Encounter

## 2015-05-31 MED ORDER — ATORVASTATIN 10 MG TAB
10 mg | ORAL_TABLET | Freq: Every day | ORAL | Status: DC
Start: 2015-05-31 — End: 2015-10-03

## 2015-05-31 NOTE — Telephone Encounter (Signed)
Patient is requesting a refill of this rx, please advise, thank you!

## 2015-05-31 NOTE — Telephone Encounter (Signed)
[facility] Stanley[facility] called for update and notified pending status.

## 2015-06-02 NOTE — Progress Notes (Signed)
Chief Complaint   Patient presents with   ??? Documentation     refaxed order for physical therapy and occupational therapy

## 2015-06-05 ENCOUNTER — Encounter

## 2015-06-07 NOTE — Telephone Encounter (Signed)
Per call from [place] with [facility],  Patient will be put on hold as her daughter is on vacation and she's in [place]. Will resume next week 06/14/15    Questions call [phone]      thanks

## 2015-06-07 NOTE — Telephone Encounter (Signed)
Noted.

## 2015-06-08 MED ORDER — CANDESARTAN 16 MG TAB
16 mg | ORAL_TABLET | ORAL | 0 refills | Status: DC
Start: 2015-06-08 — End: 2015-12-18

## 2015-06-17 MED ORDER — NATURE-THROID 16.25 MG TABLET
16.25 mg | ORAL_TABLET | ORAL | 3 refills | Status: DC
Start: 2015-06-17 — End: 2015-11-13

## 2015-06-24 NOTE — Telephone Encounter (Signed)
Chelsea Schultz called for a verbal order to extend physical therapy to week #3.    OK with me

## 2015-06-24 NOTE — Telephone Encounter (Signed)
[name] for a verbal order to extend physical therapy to week #3.    Please call

## 2015-06-29 NOTE — Telephone Encounter (Signed)
Patient daughter Chelsea Schultz on [place]) called to schedule her mother for  Medicare Wellness appointment for 07/02/15 at 1:00 pm as 1st available. Patient daughter is diabetic and asking if she's okay for appointment time. Asking if she need to fast?      please advise      thanks

## 2015-06-30 NOTE — Telephone Encounter (Signed)
Advised Misty Stanley with [facility], Dr. Jean Rosenthal approved to extend 3 weeks of Physical Therapy.

## 2015-07-01 NOTE — Telephone Encounter (Signed)
Called and informed patient that she can have a light breakfast in the morning before her 1 pm appointment. Patient acknowledged understanding of this information.

## 2015-07-02 ENCOUNTER — Inpatient Hospital Stay: Admit: 2015-08-11 | Payer: MEDICARE | Primary: Family Medicine

## 2015-07-02 ENCOUNTER — Ambulatory Visit: Admit: 2015-07-02 | Discharge: 2015-07-02 | Payer: MEDICARE | Attending: Family Medicine | Primary: Family Medicine

## 2015-07-02 DIAGNOSIS — E039 Hypothyroidism, unspecified: Secondary | ICD-10-CM

## 2015-07-02 DIAGNOSIS — E876 Hypokalemia: Secondary | ICD-10-CM

## 2015-07-02 LAB — AMB POC URINALYSIS DIP STICK AUTO W/O MICRO
Bilirubin (UA POC): NEGATIVE
Blood (UA POC): NEGATIVE
Glucose (UA POC): NEGATIVE
Nitrites (UA POC): NEGATIVE
Protein (UA POC): NEGATIVE mg/dL
Specific gravity (UA POC): 1.02 (ref 1.001–1.035)
Urobilinogen (UA POC): 0.2 (ref 0.2–1)
pH (UA POC): 5 (ref 4.6–8.0)

## 2015-07-02 MED ORDER — KLOR-CON 10 MEQ TABLET,EXTENDED RELEASE
10 mEq | ORAL_TABLET | Freq: Every day | ORAL | 3 refills | Status: DC
Start: 2015-07-02 — End: 2016-01-11

## 2015-07-02 NOTE — Telephone Encounter (Signed)
Per fax from pharmacy, the preferred alternative on the klor-con 10 MEQ tablets is klor-con 10 MEQ ER tablets. Please advise.

## 2015-07-02 NOTE — Patient Instructions (Addendum)
Well Visit, Over 65: Care Instructions  Your Care Instructions  Physical exams can help you stay healthy. Your doctor has checked your overall health and may have suggested ways to take good care of yourself. He or she also may have recommended tests. At home, you can help prevent illness with healthy eating, regular exercise, and other steps.  Follow-up care is a key part of your treatment and safety. Be sure to make and go to all appointments, and call your doctor if you are having problems. It's also a good idea to know your test results and keep a list of the medicines you take.  How can you care for yourself at home?  ?? Reach and stay at a healthy weight. This will lower your risk for many problems, such as obesity, diabetes, heart disease, and high blood pressure.  ?? Get at least 30 minutes of exercise on most days of the week. Walking is a good choice. You also may want to do other activities, such as running, swimming, cycling, or playing tennis or team sports.  ?? Do not smoke. Smoking can make health problems worse. If you need help quitting, talk to your doctor about stop-smoking programs and medicines. These can increase your chances of quitting for good.  ?? Always wear sunscreen on exposed skin. Make sure to use a broad-spectrum sunscreen that has a sun protection factor (SPF) of 30 or higher. Use it every day, even when it is cloudy.  ?? See a dentist one or two times a year for checkups and to have your teeth cleaned.  ?? Wear a seat belt in the car.  ?? Limit alcohol to 2 drinks a day for men and 1 drink a day for women. Too much alcohol can cause health problems.  Follow your doctor's advice about when to have certain tests. These tests can spot problems early.  For men and women  ?? Cholesterol. Your doctor will tell you how often to have this done based on your overall health and other things that can increase your risk for heart attack and stroke.   ?? Blood pressure. You will likely have your blood pressure checked at any visit to your doctor. If you are healthy and have a blood pressure below 120/80 mm Hg, have your blood pressure checked at least every 1 to 2 years. This can be done during a routine doctor visit. If you have slightly higher or high blood pressure, or are at risk for heart disease, your doctor will suggest more frequent tests.  ?? Diabetes. Ask your doctor whether you should have tests for diabetes.  ?? Vision. Experts recommend that you have yearly exams for glaucoma and other age-related eye problems.  ?? Hearing. Tell your doctor if you notice any change in your hearing. You can have tests to find out how well you hear.  ?? Colon cancer tests. Keep having colon cancer tests as your doctor recommends. You can have one of several types of tests.  ?? Heart attack and stroke risk. At least every 4 to 6 years, you should have your risk for heart attack and stroke assessed. Your doctor uses factors such as your age, blood pressure, cholesterol, and whether you smoke or have diabetes to show what your risk for a heart attack or stroke is over the next 10 years.  ?? Osteoporosis. Talk to your doctor about whether you should have a bone density test to find out whether you have thinning bones. Also ask your   doctor about whether you should take calcium and vitamin D supplements.  For women  ?? Pap test and pelvic exam. You may no longer need a Pap test. Talk with your doctor about whether to stop or continue to have Pap tests.  ?? Breast exam and mammogram. Ask how often you should have a mammogram, which is an X-ray of your breasts. A mammogram can spot breast cancer before it can be felt and when it is easiest to treat.  ?? Thyroid disease. Talk to your doctor about whether to have your thyroid checked as part of a regular physical exam. Women have an increased chance of a thyroid problem.  For men   ?? Prostate exam. Talk to your doctor about whether you should have a blood test (called a PSA test) for prostate cancer. Experts disagree on whether men should have this test. Some experts recommend that you discuss the benefits and risks of the test with your doctor.  ?? Abdominal aortic aneurysm. Ask your doctor whether you should have a test to check for an aneurysm. You may need a test if you ever smoked or if your parent, brother, sister, or child has had an aneurysm.  When should you call for help?  Watch closely for changes in your health, and be sure to contact your doctor if you have any problems or symptoms that concern you.  Where can you learn more?  Go to http://www.healthwise.net/[employer]Help[employer]ctions  Enter K859 in the search box to learn more about "Well Visit, Over 65: Care Instructions."  ?? 2006-2016 [employer]. Care instructions adapted under license by [employer] (which disclaims liability or warranty for this information). This care instruction is for use with your licensed healthcare professional. If you have questions about a medical condition or this instruction, always ask your healthcare professional. Healthwise, Incorporated disclaims any warranty or liability for your use of this information.  Content Version: 10.9.538570; Current as of: November 30, 2014

## 2015-07-02 NOTE — Progress Notes (Signed)
Chief Complaint   Patient presents with   ??? Follow-up     05/19/2015       1. Have you been to the ER, urgent care clinic since your last visit?  Hospitalized since your last visit?No    2. Have you seen or consulted any other health care providers outside of the [facility] since your last visit?  Include any pap smears or colon screening. No

## 2015-07-02 NOTE — Progress Notes (Signed)
Subjective  Chelsea Schultz is an 81 y.o. female who presents for BP follow up. Patient's daughter concerned that her BP is 140's - 150's / 90's. In office today, BP 121/81. Patient has a low sodium diet. She is currently on Norvasc and candesartan for HTN. Patient also here for thyroid follow up. Patient had lab work done in [place] in [place] and was started on nature Throid 16.25 mcg. She would like to have a follow thyroid check. 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 She saw PT and recommendation was ankle foot orthosis.      Allergies - reviewed:   No Known Allergies      Medications - reviewed:   Current Outpatient Prescriptions   Medication Sig   ??? KLOR-CON 10 10 mEq tablet Take 1 Tab by mouth daily.   ??? atorvastatin (LIPITOR) 10 mg tablet Take 1 Tab by mouth daily.   ??? polyethylene glycol (MIRALAX) 17 gram/dose powder Take 17 g by mouth daily.   ??? furosemide (LASIX) 20 mg tablet Take 1 Tab by mouth as needed. Indications: EDEMA   ??? trospium (SANCTURA) 20 mg tablet Take 20 mg by mouth Before breakfast, lunch, and dinner. Indications: BLADDER HYPERACTIVITY   ??? nizatidine (AXID) 150 mg cap capsule    ??? ascorbic acid (VITAMIN C) 1,000 mg tablet Take  by mouth.   ??? cranberry 500 mg capsule Take 500 mg by mouth daily.   ??? pregabalin (LYRICA) 100 mg capsule Take 1 Cap by mouth daily. Max Daily Amount: 100 mg. Indications: DIABETIC PERIPHERAL NEUROPATHY, NEUROPATHIC PAIN ASSOCIATED WITH SPINAL CORD INJURY   ??? amLODIPine (NORVASC) 10 mg tablet Take 1 Tab by mouth daily. Indications: HYPERTENSION   ??? nitrofurantoin, macrocrystal-monohydrate, (MACROBID) 100 mg capsule Take 1 Cap by mouth two (2) times a week.   ??? metFORMIN (GLUCOPHAGE) 500 mg tablet Take 1 Tab by mouth two (2) times daily (with meals). Indications: TYPE 2 DIABETES MELLITUS   ??? clopidogrel (PLAVIX) 75 mg tablet Take 1 Tab by mouth daily. Indications: CEREBRAL THROMBOEMBOLISM PREVENTION    ??? DOC-Q-LACE 100 mg capsule TAKE ONE CAPSULE BY MOUTH DAILY   ??? multivitamin with iron (DAILY MULTI-VITAMINS/IRON) tablet Take 1 tablet by mouth daily.   ??? ascorbic acid (VITAMIN C) 250 mg tablet Take 500 mg by mouth two (2) times a day.   ??? calcium-cholecalciferol, D3, (CALCIUM 600 + D) tablet Take 2 Tabs by mouth daily.   ??? NATURE-THROID 16.25 mg tab TAKE 1 TABLET BY MOUTH EVERY DAY   ??? candesartan (ATACAND) 16 mg tablet TAKE 1 TABLET BY MOUTH DAILY   ??? traMADol (ULTRAM) 50 mg tablet Take 1 Tab by mouth every six (6) hours as needed for Pain. Max Daily Amount: 200 mg.   ??? JANUVIA 100 mg tablet Take 1 Tab by mouth daily. Indications: TYPE 2 DIABETES MELLITUS     No current facility-administered medications for this visit.          Past Medical History - reviewed:  Past Medical History   Diagnosis Date   ??? Arthritis    ??? CVA (cerebral infarction)    ??? Diabetes (HCC) 01/09/2015     Hemoglobin A1C 6.4 (per records, done at outside facility)   ??? GERD (gastroesophageal reflux disease)    ??? History of recurrent UTIs      on chronic antibiotic prophylaxis, followed by urology   ??? HLD (hyperlipidemia) 01/09/2015     Total Chol 141,  Tri 49, HDL 59, LDL 72   ??? Hypertension    ??? Neuropathy    ??? Osteopetrosis    ??? Spinal stenosis          Past Surgical History - reviewed:   Past Surgical History   Procedure Laterality Date   ??? Hx orthopaedic       Lumbar laminectomy   ??? Hx orthopaedic       Lumbar fusion         Social History - reviewed:  Social History     Social History   ??? Marital status: WIDOWED     Spouse name: N/A   ??? Number of children: N/A   ??? Years of education: N/A     Occupational History   ??? Not on file.     Social History Main Topics   ??? Smoking status: Never Smoker   ??? Smokeless tobacco: Never Used   ??? Alcohol use No   ??? Drug use: No   ??? Sexual activity: No     Other Topics Concern   ??? Not on file     Social History Narrative         Family History - reviewed:  Family History   Problem Relation Age of Onset    ??? Diabetes Mother    ??? Stroke Mother    ??? Hypertension Mother    ??? Hypertension Father    ??? Stroke Father    ??? Cancer Sister    ??? Stroke Sister          Immunizations - reviewed:   Immunization History   Administered Date(s) Administered   ??? TB Skin Test (PPD) Intradermal 08/11/2013           ROS  CONSTITUTIONAL: denies weight gain  CARDIOVASCULAR: Denies: chest pain, palpitations  RESPIRATORY: Denies: shortness of breath  ENDOCRINE: Denies: polydipsia/polyuria  GI: Denies: abdominal pain  GU: dysuria        Physical Exam  Visit Vitals   ??? BP 121/81   ??? Pulse (!) 102   ??? Temp 97.6 ??F (36.4 ??C) (Oral)   ??? Resp 16   ??? Ht 4\' 7"  (1.397 m)   ??? Wt 120 lb (54.4 kg)   ??? LMP 10/23/1978   ??? SpO2 97%   ??? BMI 27.89 kg/m2       General appearance - alert, well appearing, and in no distress  Neck: supple, no thyroid nodule  Chest - clear to auscultation, no wheezes, rales or rhonchi, symmetric air entry  Heart - normal rate, regular rhythm, normal S1, S2, no murmurs, rubs, clicks or gallops  Abdomen - soft, nontender, nondistended, no suprapubic tenderness.  GU - no CVA tenderness  Extremities - no edema  Skin - no rash    Assessment/Plan    ICD-10-CM ICD-9-CM    1. Hypokalemia E87.6 276.8    2. Acquired hypothyroidism E03.9 244.9 TSH 3RD GENERATION       1. Refilled Klor-Con    2. Hypothyroidism: recently started on [employer]. Will recheck TSH today since it has been >6 weeks since starting.    3. Left lower leg hemiplegia: needs custom made AFO.    4. External dysuria: UA negative. Send for culture. Encourage oral hydration.    *Note: patient had labs ordered by Dr. Jean Rosenthal from last visit, that still needs to be drawn. Patient's daughter prefers not to have labs drawn because patient had complete blood work done in [place] in [place]  and states that Medicare will not cover another set of labs.     Follow-up Disposition:  Return in about 4 weeks (around 07/30/2015), or if symptoms worsen or fail  to improve, for Medicare Wellness.      I have discussed the diagnosis with the patient and the intended plan as seen in the above orders.  The patient has received an after-visit summary and questions were answered concerning future plans.  I have discussed medication side effects and warnings with the patient as well.      Zack Seal, MD  Family Medicine Resident

## 2015-07-03 LAB — TSH 3RD GENERATION: TSH: 0.93 u[IU]/mL (ref 0.450–4.500)

## 2015-07-06 NOTE — Telephone Encounter (Signed)
Called pharmacy per Dr.Breznau ok to substitute

## 2015-07-07 LAB — CULTURE, URINE
Urine Culture, Routine: NO GROWTH
Urine Culture, Routine: NO GROWTH

## 2015-07-07 NOTE — Telephone Encounter (Signed)
Per call from [facility]) Home Care, asking that order be changed starting today to 1 week and 3 times a day. Patient is not able to get ankle brace as appointment with Ortho 07/28/15 and they would like to continue sevices as a result. Medicare Guidelines will not allow Patient to be put on hold.  ??  Questions call/ [phone]  ??  ??  Thanks    OK with me

## 2015-07-07 NOTE — Telephone Encounter (Signed)
Per call from [facility] Home [facility], asking that order be changed starting today to 1 week and 3 times a day. Patient is not able to get ankle brace as appointment with Ortho 07/28/15 and they would like to continue sevices as a result. Medicare Guidelines will not allow  Patient to be put on hold.    Questions call/ [phone]      thanks

## 2015-07-08 NOTE — Progress Notes (Signed)
Pt needs labs before would recommend refilling medications or only dispensing a certain quantity    I reviewed the patient's medical history, the resident's findings on physical examination, the patient's diagnoses, and treatment plan as documented in the resident note.     Please arrange close followup for patient

## 2015-07-13 NOTE — Progress Notes (Signed)
Left a voicemail. Advised patient to stop taking Klor-Con for now. She should return to office to have BMP checked.

## 2015-07-13 NOTE — Addendum Note (Signed)
Addended by: Zack Seal on: 07/13/2015 12:08 PM      Modules accepted: Orders

## 2015-08-12 NOTE — Telephone Encounter (Signed)
[phone]  Tressa BusmanSarah, [facility], called to say the patient brought a script to them for an FAO. This is a detailed script for Medicare Compliance.    Sent a fax here on October 5 for signature and date.    Please respond and return form.

## 2015-08-13 NOTE — Telephone Encounter (Signed)
Darl PikesSusan with [facility] calling for status and asking this be addressed ASAP. Darl PikesSusan Notes that Dr. Robby SermonHendricks signed Rx previously. Detailed RX is for Leg brace. Darl PikesSusan will refax if needed.    Questions call [phone]        thanks

## 2015-08-17 NOTE — Telephone Encounter (Signed)
[facility] called for update and informed status pending.

## 2015-08-23 ENCOUNTER — Encounter

## 2015-08-23 NOTE — Telephone Encounter (Signed)
Received call from [facility]. They state that the form needs to be completed and faxed today as this has now become urgent

## 2015-08-24 MED ORDER — JANUVIA 100 MG TABLET
100 mg | ORAL_TABLET | ORAL | 0 refills | Status: DC
Start: 2015-08-24 — End: 2015-11-22

## 2015-09-13 NOTE — Telephone Encounter (Signed)
Per call from [facility],  Request verbal order for extension for physical therapy to start 09/19/15 2 times week 1 and then 1 week of week 1.    Asking to be contacted at [phone]    Asked that message be sent to Dr. Jean RosenthalJackson    thanks

## 2015-09-15 NOTE — Telephone Encounter (Signed)
Osborne Casco[facility]isa Corbin with [facility] aware to continue PT with patient, she will fax order for physician to sign.

## 2015-09-21 MED ORDER — POLYETHYLENE GLYCOL 3350 100 % ORAL POWDER
17 gram/dose | ORAL | 0 refills | Status: DC
Start: 2015-09-21 — End: 2016-05-25

## 2015-10-02 ENCOUNTER — Encounter

## 2015-10-03 MED ORDER — ATORVASTATIN 10 MG TAB
10 mg | ORAL_TABLET | ORAL | 0 refills | Status: DC
Start: 2015-10-03 — End: 2015-11-08

## 2015-10-04 NOTE — Telephone Encounter (Signed)
-----   Message from Elease EtienneKaren I Harris sent at 10/04/2015  9:55 AM EST -----  Regarding: Dr Richard MiuBrezau/telephone  Pt's daughter (p) [phone], ext [phone]  Olegario MessierKathy , was told that she could come in for labs to check her potassium , without an appointment, and she would like to confirm  if she needed to fast or not, before she takes off from work early, it is.

## 2015-10-05 NOTE — Addendum Note (Signed)
Addended by: Zack SealBREZNAU, Kirbie Stodghill J on: 10/05/2015 03:29 PM      Modules accepted: Orders

## 2015-10-06 ENCOUNTER — Inpatient Hospital Stay: Admit: 2015-12-08 | Payer: MEDICARE | Primary: Family Medicine

## 2015-10-06 ENCOUNTER — Ambulatory Visit: Admit: 2015-10-06 | Discharge: 2015-10-07 | Payer: MEDICARE | Attending: Family Medicine | Primary: Family Medicine

## 2015-10-06 DIAGNOSIS — N390 Urinary tract infection, site not specified: Secondary | ICD-10-CM

## 2015-10-06 LAB — AMB POC URINALYSIS DIP STICK AUTO W/O MICRO
Bilirubin (UA POC): NEGATIVE
Blood (UA POC): NEGATIVE
Glucose (UA POC): NEGATIVE
Ketones (UA POC): NEGATIVE
Leukocyte esterase (UA POC): NEGATIVE
Nitrites (UA POC): POSITIVE
Protein (UA POC): NEGATIVE mg/dL
Specific gravity (UA POC): 1.03 (ref 1.001–1.035)
Urobilinogen (UA POC): 0.2 (ref 0.2–1)
pH (UA POC): 5 (ref 4.6–8.0)

## 2015-10-06 NOTE — Progress Notes (Signed)
[name]     Resident Progress Note        Patient: Chelsea Schultz MRN: [id number]  SSN: [id number]    Date of Birth: [birth year]  Age: 81 y.o.  Sex: female        Chief Complaint   Patient presents with   ??? Urinary Pain     burning--x3 days     Case of a 81 yo F that comes complaining of burning sensation during urination for 3 days. She have a significant PMH of recurrent UTIs. The patient said that this kind of symptoms have happened before, and have resolved with a course of antibiotics. She denies abdominal, flank, or pelvic pain, fever, hematuria, increased frequency, urgency, pyuria, rash, malaise, nausea, vomiting, diarrhea, or any other symptom.     Subjective:     Encounter Diagnoses   Name Primary?   ??? Urinary tract infection without hematuria, site unspecified Yes         Current and past medical information:    Current Medications after this visit::     Current Outpatient Prescriptions   Medication Sig   ??? nitrofurantoin (MACRODANTIN) 100 mg capsule Take 1 Cap by mouth two (2) times a day for 5 days.   ??? atorvastatin (LIPITOR) 10 mg tablet TAKE 1 TABLET BY MOUTH DAILY   ??? polyethylene glycol (MIRALAX) 17 gram/dose powder TAKE 17GM AS DIRECTED ONCE DAILY   ??? JANUVIA 100 mg tablet TAKE 1 TABLET BY MOUTH EVERY DAY   ??? KLOR-CON 10 10 mEq tablet Take 1 Tab by mouth daily.   ??? NATURE-THROID 16.25 mg tab TAKE 1 TABLET BY MOUTH EVERY DAY   ??? candesartan (ATACAND) 16 mg tablet TAKE 1 TABLET BY MOUTH DAILY   ??? furosemide (LASIX) 20 mg tablet Take 1 Tab by mouth as needed. Indications: EDEMA   ??? trospium (SANCTURA) 20 mg tablet Take 20 mg by mouth Before breakfast, lunch, and dinner. Indications: BLADDER HYPERACTIVITY   ??? nizatidine (AXID) 150 mg cap capsule    ??? ascorbic acid (VITAMIN C) 1,000 mg tablet Take  by mouth.   ??? pregabalin (LYRICA) 100 mg capsule Take 1 Cap by mouth daily. Max Daily  Amount: 100 mg. Indications: DIABETIC PERIPHERAL NEUROPATHY, NEUROPATHIC PAIN ASSOCIATED WITH SPINAL CORD INJURY   ??? amLODIPine (NORVASC) 10 mg tablet Take 1 Tab by mouth daily. Indications: HYPERTENSION   ??? nitrofurantoin, macrocrystal-monohydrate, (MACROBID) 100 mg capsule Take 1 Cap by mouth two (2) times a week.   ??? clopidogrel (PLAVIX) 75 mg tablet Take 1 Tab by mouth daily. Indications: CEREBRAL THROMBOEMBOLISM PREVENTION   ??? DOC-Q-LACE 100 mg capsule TAKE ONE CAPSULE BY MOUTH DAILY   ??? multivitamin with iron (DAILY MULTI-VITAMINS/IRON) tablet Take 1 tablet by mouth daily.   ??? ascorbic acid (VITAMIN C) 250 mg tablet Take 500 mg by mouth two (2) times a day.   ??? calcium-cholecalciferol, D3, (CALCIUM 600 + D) tablet Take 2 Tabs by mouth daily.   ??? metFORMIN (GLUCOPHAGE) 500 mg tablet TAKE 1 TABLET BY MOUTH TWICE DAILY WITH MEALS   ??? traMADol (ULTRAM) 50 mg tablet Take 1 Tab by mouth every six (6) hours as needed for Pain. Max Daily Amount: 200 mg.   ??? cranberry 500 mg capsule Take 500 mg by mouth daily.     No current facility-administered medications for this visit.        Patient Active Problem List    Diagnosis Date Noted   ???  Acute CVA (cerebrovascular accident) (HCC) 06/14/2013   ??? HTN (hypertension), benign 06/13/2013   ??? DM type 2 (diabetes mellitus, type 2) (HCC) 06/13/2013   ??? Vulvar atrophy 06/10/2012   ??? SUI (stress urinary incontinence, female) 06/10/2012   ??? Urgency incontinence 06/10/2012   ??? UTI (urinary tract infection) 06/10/2012   ??? Diabetes (HCC) 10/25/2009   ??? Back pain with radiation 10/25/2009   ??? HTN (hypertension) 08/22/2009       Past Medical History   Diagnosis Date   ??? Arthritis    ??? CVA (cerebral infarction)    ??? Diabetes (HCC) 01/09/2015     Hemoglobin A1C 6.4 (per records, done at outside facility)   ??? GERD (gastroesophageal reflux disease)    ??? History of recurrent UTIs      on chronic antibiotic prophylaxis, followed by urology   ??? HLD (hyperlipidemia) 01/09/2015      Total Chol 141, Tri 49, HDL 59, LDL 72   ??? Hypertension    ??? Neuropathy    ??? Osteopetrosis    ??? Spinal stenosis        No Known Allergies    Past Surgical History   Procedure Laterality Date   ??? Hx orthopaedic       Lumbar laminectomy   ??? Hx orthopaedic       Lumbar fusion       Social History     Social History   ??? Marital status: WIDOWED     Spouse name: N/A   ??? Number of children: N/A   ??? Years of education: N/A     Social History Main Topics   ??? Smoking status: Never Smoker   ??? Smokeless tobacco: Never Used   ??? Alcohol use No   ??? Drug use: No   ??? Sexual activity: No     Other Topics Concern   ??? None     Social History Narrative       Review of Systems   Constitutional: Negative for chills, fever and malaise/fatigue.   Eyes: Negative.    Respiratory: Negative.    Cardiovascular: Negative.    Gastrointestinal: Negative.  Negative for abdominal pain, diarrhea, nausea and vomiting.   Genitourinary: Positive for dysuria. Negative for flank pain, frequency, hematuria and urgency.   Musculoskeletal: Negative.    Skin: Negative for itching and rash.   Neurological: Negative.  Negative for headaches.   Endo/Heme/Allergies: Negative.    Psychiatric/Behavioral: Negative.    All other systems reviewed and are negative.      Objective:     Vitals:    10/06/15 1851   BP: 165/69   Pulse: 96   Temp: 97.5 ??F (36.4 ??C)   TempSrc: Oral   SpO2: 99%   Weight: 122 lb 12.8 oz (55.7 kg)   Height:  (1.397 m)      Body mass index is 28.54 kg/(m^2).      Physical Exam   Constitutional: She is oriented to person, place, and time. She appears well-developed and well-nourished. No distress.   HENT:   Head: Normocephalic and atraumatic.   Eyes: Conjunctivae are normal. Pupils are equal, round, and reactive to light.   Cardiovascular: Normal rate, regular rhythm and normal heart sounds.  Exam reveals no gallop and no friction rub.    No murmur heard.  Pulmonary/Chest: Effort normal and breath sounds normal. No respiratory  distress. She has no wheezes. She has no rales. She exhibits no tenderness.   Abdominal: Soft. Bowel  sounds are normal. She exhibits no distension and no mass. There is no tenderness. There is no rebound, no guarding and no CVA tenderness.   Neurological: She is alert and oriented to person, place, and time.   Skin: She is not diaphoretic.   Vitals reviewed.    Health Maintenance Due   Topic Date Due   ??? FOOT EXAM Q1  04/18/1936   ??? EYE EXAM RETINAL OR DILATED Q1  04/18/1938   ??? DTaP/Tdap/Td series (1 - Tdap) 04/18/1949   ??? ZOSTER VACCINE AGE 47>  04/18/1988   ??? GLAUCOMA SCREENING Q2Y  04/18/1993   ??? Pneumococcal 65+ Low/Medium Risk (1 of 2 - PCV13) 04/18/1993   ??? MEDICARE YEARLY EXAM  04/18/1993   ??? MICROALBUMIN Q1  05/23/2013   ??? INFLUENZA AGE 60 TO ADULT  05/24/2015   ??? HEMOGLOBIN A1C Q6M  07/15/2015       Assessment and orders:     Case of a 81 yo F with uncomplicated UTI. On AMB POC U/A, there were evidence of nitrates. Due to the patient PMH, a urine culture was collected. Treatment with a 5 days course of Nitrofurantoin was prescribed. Patient and daughter were advised to return to clinic if no resolution of symptoms with treatment or in case of worsening of symptoms go to an ED.       ICD-10-CM ICD-9-CM    1. Urinary tract infection without hematuria, site unspecified N39.0 599.0 AMB POC URINALYSIS DIP STICK AUTO W/O MICRO      CULTURE, URINE      nitrofurantoin (MACRODANTIN) 100 mg capsule             Plan of care:  Discussed diagnoses in detail with patient.     Medication risks/benefits/side effects discussed with patient.     All of the patient's questions were addressed. The patient understands and agrees with our plan of care.    The patient knows to call back if they are unsure of or forget any changes we discussed today or if the symptoms change.     The patient received an After-Visit Summary which contains VS, orders, medication list and allergy list. This can be used as a "mini-medical  record" should they have to seek medical care while out of town.      Follow-up Disposition:  Return if symptoms worsen or fail to improve.      Patient and plan discussed and reviewed with Dr. Su Hiltoberts (Attending Physician)      Signed By: Albertine GratesEduardo Hernandez-Verge, MD  PGY-1 Family Medicine Resident    October 08, 2015

## 2015-10-06 NOTE — Progress Notes (Signed)
Chief Complaint   Patient presents with   ??? Urinary Pain     burning--x3 days     1. Have you been to the ER, urgent care clinic since your last visit?  Hospitalized since your last visit?No    2. Have you seen or consulted any other health care providers outside of the [facility] since your last visit?  Include any pap smears or colon screening. No

## 2015-10-06 NOTE — Progress Notes (Signed)
I discussed the findings, assessment and plan with the resident and agree with the resident's findings and plan as documented in the resident's note.

## 2015-10-06 NOTE — Telephone Encounter (Signed)
Left voicemail for patient's daughter.     If Mrs. Betsill-Sledge return call, please inform her that labs are ordered and she may schedule a lab only appointment for patient.

## 2015-10-06 NOTE — Patient Instructions (Signed)
Urinary Tract Infection in Women: Care Instructions  Your Care Instructions     A urinary tract infection, or UTI, is a general term for an infection anywhere between the kidneys and the urethra (where urine comes out). Most UTIs are bladder infections. They often cause pain or burning when you urinate.  UTIs are caused by bacteria and can be cured with antibiotics. Be sure to complete your treatment so that the infection goes away.  Follow-up care is a key part of your treatment and safety. Be sure to make and go to all appointments, and call your doctor if you are having problems. It's also a good idea to know your test results and keep a list of the medicines you take.  How can you care for yourself at home?  ?? Take your antibiotics as directed. Do not stop taking them just because you feel better. You need to take the full course of antibiotics.  ?? Drink extra water and other fluids for the next day or two. This may help wash out the bacteria that are causing the infection. (If you have kidney, heart, or liver disease and have to limit fluids, talk with your doctor before you increase your fluid intake.)  ?? Avoid drinks that are carbonated or have caffeine. They can irritate the bladder.  ?? Urinate often. Try to empty your bladder each time.  ?? To relieve pain, take a hot bath or lay a heating pad set on low over your lower belly or genital area. Never go to sleep with a heating pad in place.  To prevent UTIs  ?? Drink plenty of water each day. This helps you urinate often, which clears bacteria from your system. (If you have kidney, heart, or liver disease and have to limit fluids, talk with your doctor before you increase your fluid intake.)  ?? Consider adding cranberry juice to your diet.  ?? Urinate when you need to.  ?? Urinate right after you have sex.  ?? Change sanitary pads often.  ?? Avoid douches, bubble baths, feminine hygiene sprays, and other feminine hygiene products that have deodorants.   ?? After going to the bathroom, wipe from front to back.  When should you call for help?  Call your doctor now or seek immediate medical care if:  ?? Symptoms such as fever, chills, nausea, or vomiting get worse or appear for the first time.  ?? You have new pain in your back just below your rib cage. This is called flank pain.  ?? There is new blood or pus in your urine.  ?? You have any problems with your antibiotic medicine.  Watch closely for changes in your health, and be sure to contact your doctor if:  ?? You are not getting better after taking an antibiotic for 2 days.  ?? Your symptoms go away but then come back.  Where can you learn more?  Go to http://www.healthwise.net/[employer]  Enter K848 in the search box to learn more about "Urinary Tract Infection in Women: Care Instructions."  ?? 2006-2016 [employer]. Care instructions adapted under license by [employer] (which disclaims liability or warranty for this information). This care instruction is for use with your licensed healthcare professional. If you have questions about a medical condition or this instruction, always ask your healthcare professional. Healthwise, Incorporated disclaims any warranty or liability for your use of this information.  Content Version: 11.0.578772; Current as of: September 11, 2014

## 2015-10-07 ENCOUNTER — Encounter

## 2015-10-07 MED ORDER — NITROFURANTOIN MACROCRYSTAL 100 MG CAP
100 mg | ORAL_CAPSULE | Freq: Two times a day (BID) | ORAL | 0 refills | Status: AC
Start: 2015-10-07 — End: 2015-10-11

## 2015-10-08 ENCOUNTER — Inpatient Hospital Stay: Admit: 2015-12-08 | Payer: MEDICARE | Primary: Family Medicine

## 2015-10-08 ENCOUNTER — Other Ambulatory Visit: Admit: 2015-10-08 | Discharge: 2015-10-08 | Payer: MEDICARE | Primary: Family Medicine

## 2015-10-08 DIAGNOSIS — E876 Hypokalemia: Secondary | ICD-10-CM

## 2015-10-08 MED ORDER — METFORMIN 500 MG TAB
500 mg | ORAL_TABLET | ORAL | 0 refills | Status: DC
Start: 2015-10-08 — End: 2015-11-22

## 2015-10-08 NOTE — Progress Notes (Signed)
Labs drawn.

## 2015-10-09 LAB — CULTURE, URINE

## 2015-10-09 LAB — METABOLIC PANEL, BASIC
BUN/Creatinine ratio: 19 (ref 11–26)
BUN: 14 mg/dL (ref 8–27)
CO2: 23 mmol/L (ref 18–29)
Calcium: 10.4 mg/dL — ABNORMAL HIGH (ref 8.7–10.3)
Chloride: 96 mmol/L (ref 96–106)
Creatinine: 0.72 mg/dL (ref 0.57–1.00)
GFR est AA: 87 mL/min/{1.73_m2} (ref >59)
GFR est non-AA: 76 mL/min/{1.73_m2} (ref >59)
Glucose: 128 mg/dL — ABNORMAL HIGH (ref 65–99)
Potassium: 4.3 mmol/L (ref 3.5–5.2)
Sodium: 134 mmol/L (ref 134–144)

## 2015-10-11 NOTE — Progress Notes (Signed)
Labs reviewed. Glucose slightly high. K 4.3. Left a voicemail for patient to call the office with any questions. I will also mail the results out.

## 2015-10-12 ENCOUNTER — Encounter

## 2015-10-12 ENCOUNTER — Telehealth

## 2015-10-12 NOTE — Telephone Encounter (Signed)
Patient was called to report the results of the urine culture of last visit on 10/06/2015. There were no answer, a massage was left to call back to clinic. Will try calling again this afternoon.

## 2015-10-13 MED ORDER — CIPROFLOXACIN 500 MG TAB
500 mg | ORAL_TABLET | Freq: Two times a day (BID) | ORAL | 0 refills | Status: AC
Start: 2015-10-13 — End: 2015-10-15

## 2015-10-27 NOTE — Telephone Encounter (Signed)
This paperwork was left for Dr. Viviann SpareSteven. Paperwork is in Dr. Andria MeuseStevens box to be completed. Patients daughter would like a call to know if an appointment is needed in order for the paperwork to be completed

## 2015-10-27 NOTE — Telephone Encounter (Signed)
This looks like a family member has called and not the patient.  (maybe a work number?)

## 2015-10-27 NOTE — Telephone Encounter (Signed)
-----   Message from Unice BaileyElizabeth J Pierce sent at 10/27/2015 12:46 PM EST -----  Regarding: Dr. Sherlynn CarbonPaul Jackson/Telephone  Pt would like to speak with someone to follow up on paperwork that was left to be filled out last week.  Her contact number is . [phone] until 5:30.  If no answer please leave a message.

## 2015-10-29 NOTE — Telephone Encounter (Signed)
Spoke Dr. Cline CoolsAbdul-Rahman and Dr. Pryor OchoaPaine.  Patient will need an appointment to see physician to have paperwork completed.  Paperwork will be left in Dr. Andria MeuseStevens box for completion.      If patient's daughter calls, please inform her that patient will need to have appointment to have paperwork completed.     Sheryn BisonAnnie Kober, LPN Clinical Supervisor will be cc on this message.

## 2015-10-29 NOTE — Telephone Encounter (Signed)
Chelsea ConnCathy Schultz  (called from number in message below)    Daughter, POA, called to say this paper work is due today as the patient will be returning back to the center on Monday.    She is asking if another physician can address this today; stating this is simple and any physician should be able to fill it out.    Said she will call back this afternoon if she has not rec'd a call from the office.

## 2015-11-01 NOTE — Telephone Encounter (Signed)
Filled out paperwork and signed off by Dr. Jean RosenthalJackson. Attached updated MAR and faxed over. Eulas PostDebbie Arena called daughter to inform.

## 2015-11-08 ENCOUNTER — Encounter

## 2015-11-08 MED ORDER — ATORVASTATIN 10 MG TAB
10 mg | ORAL_TABLET | ORAL | 3 refills | Status: DC
Start: 2015-11-08 — End: 2016-03-06

## 2015-11-14 MED ORDER — NATURE-THROID 16.25 MG TABLET
16.25 mg | ORAL_TABLET | ORAL | 0 refills | Status: DC
Start: 2015-11-14 — End: 2015-12-13

## 2015-11-22 ENCOUNTER — Encounter

## 2015-11-22 MED ORDER — POLYETHYLENE GLYCOL 3350 100 % ORAL POWDER
17 gram/dose | ORAL | 0 refills | Status: DC
Start: 2015-11-22 — End: 2016-01-11

## 2015-11-22 MED ORDER — JANUVIA 100 MG TABLET
100 mg | ORAL_TABLET | ORAL | 0 refills | Status: DC
Start: 2015-11-22 — End: 2016-04-04

## 2015-11-22 MED ORDER — METFORMIN 500 MG TAB
500 mg | ORAL_TABLET | ORAL | 0 refills | Status: DC
Start: 2015-11-22 — End: 2016-01-04

## 2015-11-26 NOTE — Telephone Encounter (Signed)
PA was called and will be pending review 72hrs, [id number].

## 2015-11-26 NOTE — Telephone Encounter (Signed)
Per fax from the pharmacy, a prior auth is needed on the januvia 100 MG tablets. Please call [id number] to initiate prior auth

## 2015-12-14 MED ORDER — NATURE-THROID 16.25 MG TABLET
16.25 mg | ORAL_TABLET | ORAL | 0 refills | Status: DC
Start: 2015-12-14 — End: 2016-01-12

## 2015-12-18 ENCOUNTER — Encounter

## 2015-12-20 MED ORDER — CANDESARTAN 16 MG TAB
16 mg | ORAL_TABLET | ORAL | 0 refills | Status: DC
Start: 2015-12-20 — End: 2016-01-11

## 2015-12-24 NOTE — Telephone Encounter (Signed)
Dr.Stevens Have you even prescribed this recently?  Trospiu-Chloride 20mg ? The only order I see was in her med list as historical provider. Can you please give me some clarification

## 2015-12-24 NOTE — Telephone Encounter (Signed)
Kat with [employer] calling and states Prior Auth is needed for trospiu- chloride.    Call [phone]    Auth need to be done by 01/06/16    WUX#[id number]

## 2015-12-26 ENCOUNTER — Encounter

## 2015-12-31 NOTE — Telephone Encounter (Signed)
[facility], asking for a call as they have clinical questions on Januvia.    [facility]    Call [phone]  Ext [phone]

## 2015-12-31 NOTE — Telephone Encounter (Signed)
Called and left message

## 2016-01-03 NOTE — Telephone Encounter (Signed)
Patient's daughter called regarding this matter. Patient will be out of one medication tomorrow and would like for you to reach out to insurance company again

## 2016-01-04 ENCOUNTER — Encounter

## 2016-01-04 MED ORDER — METFORMIN 500 MG TAB
500 mg | ORAL_TABLET | ORAL | 0 refills | Status: DC
Start: 2016-01-04 — End: 2016-06-06

## 2016-01-11 ENCOUNTER — Ambulatory Visit: Admit: 2016-01-11 | Discharge: 2016-01-11 | Payer: MEDICARE | Attending: Family Medicine | Primary: Family Medicine

## 2016-01-11 ENCOUNTER — Encounter: Attending: Family Medicine | Primary: Family Medicine

## 2016-01-11 ENCOUNTER — Inpatient Hospital Stay: Admit: 2016-02-02 | Payer: MEDICARE | Primary: Family Medicine

## 2016-01-11 DIAGNOSIS — N390 Urinary tract infection, site not specified: Secondary | ICD-10-CM

## 2016-01-11 DIAGNOSIS — R300 Dysuria: Secondary | ICD-10-CM

## 2016-01-11 LAB — AMB POC URINALYSIS DIP STICK AUTO W/O MICRO
Bilirubin (UA POC): NEGATIVE
Blood (UA POC): NEGATIVE
Glucose (UA POC): NEGATIVE
Nitrites (UA POC): POSITIVE
Specific gravity (UA POC): 1.025 (ref 1.001–1.035)
Urobilinogen (UA POC): 0.2 (ref 0.2–1)
pH (UA POC): 7 (ref 4.6–8.0)

## 2016-01-11 MED ORDER — CIPROFLOXACIN 500 MG TAB
500 mg | ORAL_TABLET | Freq: Two times a day (BID) | ORAL | 0 refills | Status: DC
Start: 2016-01-11 — End: 2016-01-16

## 2016-01-11 MED ORDER — CANDESARTAN 32 MG TAB
32 mg | ORAL_TABLET | Freq: Every day | ORAL | 1 refills | Status: DC
Start: 2016-01-11 — End: 2016-07-23

## 2016-01-11 NOTE — Progress Notes (Signed)
I reviewed with the resident the medical history and the resident's findings on the physical examination.  I discussed with the resident the patient's diagnosis and concur with the plan.  Pt seen and examined with Dr Arlington CalixSingla

## 2016-01-11 NOTE — Patient Instructions (Addendum)
- Drink plenty of fluids.  - Symptoms should resolve within 24-48 hours after starting the antibiotic  - If you develop fever, abdominal pain, back pain, or nausea and vomiting then return to clinic or go to the emergency room.  This could indicate that you have a more serious infection or/and infection in the kidneys.      Send me your blood pressure readings in 1 week.   Exercise at least 150 mins/week      DASH Diet: Care Instructions  Your Care Instructions  The DASH diet is an eating plan that can help lower your blood pressure. DASH stands for Dietary Approaches to Stop Hypertension. Hypertension is high blood pressure.  The DASH diet focuses on eating foods that are high in calcium, potassium, and magnesium. These nutrients can lower blood pressure. The foods that are highest in these nutrients are fruits, vegetables, low-fat dairy products, nuts, seeds, and legumes. But taking calcium, potassium, and magnesium supplements instead of eating foods that are high in those nutrients does not have the same effect. The DASH diet also includes whole grains, fish, and poultry.  The DASH diet is one of several lifestyle changes your doctor may recommend to lower your high blood pressure. Your doctor may also want you to decrease the amount of sodium in your diet. Lowering sodium while following the DASH diet can lower blood pressure even further than just the DASH diet alone.  Follow-up care is a key part of your treatment and safety. Be sure to make and go to all appointments, and call your doctor if you are having problems. It's also a good idea to know your test results and keep a list of the medicines you take.  How can you care for yourself at home?  Following the DASH diet  ?? Eat 4 to 5 servings of fruit each day. A serving is 1 medium-sized piece of fruit, ?? cup chopped or canned fruit, 1/4 cup dried fruit, or 4 ounces (?? cup) of fruit juice. Choose fruit more often than fruit juice.   ?? Eat 4 to 5 servings of vegetables each day. A serving is 1 cup of lettuce or raw leafy vegetables, ?? cup of chopped or cooked vegetables, or 4 ounces (?? cup) of vegetable juice. Choose vegetables more often than vegetable juice.  ?? Get 2 to 3 servings of low-fat and fat-free dairy each day. A serving is 8 ounces of milk, 1 cup of yogurt, or 1 ?? ounces of cheese.  ?? Eat 6 to 8 servings of grains each day. A serving is 1 slice of bread, 1 ounce of dry cereal, or ?? cup of cooked rice, pasta, or cooked cereal. Try to choose whole-grain products as much as possible.  ?? Limit lean meat, poultry, and fish to 2 servings each day. A serving is 3 ounces, about the size of a deck of cards.  ?? Eat 4 to 5 servings of nuts, seeds, and legumes (cooked dried beans, lentils, and split peas) each week. A serving is 1/3 cup of nuts, 2 tablespoons of seeds, or ?? cup of cooked beans or peas.  ?? Limit fats and oils to 2 to 3 servings each day. A serving is 1 teaspoon of vegetable oil or 2 tablespoons of salad dressing.  ?? Limit sweets and added sugars to 5 servings or less a week. A serving is 1 tablespoon jelly or jam, ?? cup sorbet, or 1 cup of lemonade.  ?? Eat less than 2,300  milligrams (mg) of sodium a day. If you limit your sodium to 1,500 mg a day, you can lower your blood pressure even more.  Tips for success  ?? Start small. Do not try to make dramatic changes to your diet all at once. You might feel that you are missing out on your favorite foods and then be more likely to not follow the plan. Make small changes, and stick with them. Once those changes become habit, add a few more changes.  ?? Try some of the following:  ?? Make it a goal to eat a fruit or vegetable at every meal and at snacks. This will make it easy to get the recommended amount of fruits and vegetables each day.  ?? Try yogurt topped with fruit and nuts for a snack or healthy dessert.  ?? Add lettuce, tomato, cucumber, and onion to sandwiches.   ?? Combine a ready-made pizza crust with low-fat mozzarella cheese and lots of vegetable toppings. Try using tomatoes, squash, spinach, broccoli, carrots, cauliflower, and onions.  ?? Have a variety of cut-up vegetables with a low-fat dip as an appetizer instead of chips and dip.  ?? Sprinkle sunflower seeds or chopped almonds over salads. Or try adding chopped walnuts or almonds to cooked vegetables.  ?? Try some vegetarian meals using beans and peas. Add garbanzo or kidney beans to salads. Make burritos and tacos with mashed pinto beans or black beans.  Where can you learn more?  Go to [employer].  Enter [phone] in the search box to learn more about "DASH Diet: Care Instructions."  Current as of: January 13, 2015  Content Version: 11.1  ?? 2006-2016 [employer]. Care instructions adapted under license by [employer] (which disclaims liability or warranty for this information). If you have questions about a medical condition or this instruction, always ask your healthcare professional. [employer] disclaims any warranty or liability for your use of this information.       Urinary Tract Infection in Women: Care Instructions  Your Care Instructions    A urinary tract infection, or UTI, is a general term for an infection anywhere between the kidneys and the urethra (where urine comes out). Most UTIs are bladder infections. They often cause pain or burning when you urinate.  UTIs are caused by bacteria and can be cured with antibiotics. Be sure to complete your treatment so that the infection goes away.  Follow-up care is a key part of your treatment and safety. Be sure to make and go to all appointments, and call your doctor if you are having problems. It's also a good idea to know your test results and keep a list of the medicines you take.  How can you care for yourself at home?  ?? Take your antibiotics as directed. Do not stop taking them just because  you feel better. You need to take the full course of antibiotics.  ?? Drink extra water and other fluids for the next day or two. This may help wash out the bacteria that are causing the infection. (If you have kidney, heart, or liver disease and have to limit fluids, talk with your doctor before you increase your fluid intake.)  ?? Avoid drinks that are carbonated or have caffeine. They can irritate the bladder.  ?? Urinate often. Try to empty your bladder each time.  ?? To relieve pain, take a hot bath or lay a heating pad set on low over your lower belly or genital area. Never go  to sleep with a heating pad in place.  To prevent UTIs  ?? Drink plenty of water each day. This helps you urinate often, which clears bacteria from your system. (If you have kidney, heart, or liver disease and have to limit fluids, talk with your doctor before you increase your fluid intake.)  ?? Consider adding cranberry juice to your diet.  ?? Urinate when you need to.  ?? Urinate right after you have sex.  ?? Change sanitary pads often.  ?? Avoid douches, bubble baths, feminine hygiene sprays, and other feminine hygiene products that have deodorants.  ?? After going to the bathroom, wipe from front to back.  When should you call for help?  Call your doctor now or seek immediate medical care if:  ?? Symptoms such as fever, chills, nausea, or vomiting get worse or appear for the first time.  ?? You have new pain in your back just below your rib cage. This is called flank pain.  ?? There is new blood or pus in your urine.  ?? You have any problems with your antibiotic medicine.  Watch closely for changes in your health, and be sure to contact your doctor if:  ?? You are not getting better after taking an antibiotic for 2 days.  ?? Your symptoms go away but then come back.  Where can you learn more?  Go to [employer].  Enter [phone] in the search box to learn more about "Urinary Tract Infection  in Women: Care Instructions."  Current as of: June 04, 2015  Content Version: 11.1  ?? 2006-2016 [employer]. Care instructions adapted under license by [employer] (which disclaims liability or warranty for this information). If you have questions about a medical condition or this instruction, always ask your healthcare professional. [employer] disclaims any warranty or liability for your use of this information.

## 2016-01-11 NOTE — Progress Notes (Signed)
Chelsea Schultz is a 82 y.o. female with history of HTN, DM, stress urinary incontinence, urge incontinence, CVA who presents with dysuria.   History provided by: patient and daughter     HPI  Patient reports dysuria for 5 days. It is associated with foul smelling urine.   Coughing leads to urinary incontinence  Wears incontinence pads at baseline.   No fever, chills, back pain nausea, vomiting    Last UTI was about 12/12/15  Has appt with urology for recurrent UTIs in April    BP at home 140-153 systolic.   Follows low salt diet  Is not active, but can walk with a walker    Patient Active Problem List   Diagnosis Code   ??? HTN (hypertension) I10   ??? Diabetes (HCC) E11.9   ??? 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 Vulvar atrophy N90.5   ??? SUI (stress urinary incontinence, female) N39.3   ??? Urgency incontinence N39.41   ??? UTI (urinary tract infection) N39.0   ??? HTN (hypertension), benign I10   ??? DM type 2 (diabetes mellitus, type 2) (HCC) E11.9   ??? Acute CVA (cerebrovascular accident) (HCC) I63.9          Current Outpatient Prescriptions:   ???  CYANOCOBALAMIN, VITAMIN B-12, PO, Take  by mouth., Disp: , Rfl:   ???  aspirin delayed-release 81 mg tablet, Take  by mouth daily., Disp: , Rfl:   ???  ciprofloxacin HCl (CIPRO) 500 mg tablet, Take 1 Tab by mouth two (2) times a day for 14 days., Disp: 28 Tab, Rfl: 0  ???  candesartan (ATACAND) 32 mg tablet, Take 1 Tab by mouth daily., Disp: 90 Tab, Rfl: 1  ???  metFORMIN (GLUCOPHAGE) 500 mg tablet, TAKE 1 TABLET BY MOUTH TWICE DAILY WITH MEALS, Disp: 90 Tab, Rfl: 0  ???  NATURE-THROID 16.25 mg tab, TAKE 1 TABLET BY MOUTH EVERY DAY, Disp: 30 Tab, Rfl: 0  ???  JANUVIA 100 mg tablet, TAKE 1 TABLET BY MOUTH EVERY DAY, Disp: 90 Tab, Rfl: 0  ???  atorvastatin (LIPITOR) 10 mg tablet, TAKE 1 TABLET BY MOUTH DAILY, Disp: 30 Tab, Rfl: 3  ???  polyethylene glycol (MIRALAX) 17 gram/dose powder, TAKE 17GM AS DIRECTED ONCE DAILY, Disp: 765 g, Rfl: 0   ???  trospium (SANCTURA) 20 mg tablet, Take 20 mg by mouth Before breakfast, lunch, and dinner. Indications: BLADDER HYPERACTIVITY, Disp: , Rfl: 0  ???  ascorbic acid (VITAMIN C) 1,000 mg tablet, Take  by mouth., Disp: , Rfl:   ???  pregabalin (LYRICA) 100 mg capsule, Take 1 Cap by mouth daily. Max Daily Amount: 100 mg. Indications: DIABETIC PERIPHERAL NEUROPATHY, NEUROPATHIC PAIN ASSOCIATED WITH SPINAL CORD INJURY, Disp: 90 Cap, Rfl: 3  ???  amLODIPine (NORVASC) 10 mg tablet, Take 1 Tab by mouth daily. Indications: HYPERTENSION, Disp: 90 Tab, Rfl: 3  ???  clopidogrel (PLAVIX) 75 mg tablet, Take 1 Tab by mouth daily. Indications: CEREBRAL THROMBOEMBOLISM PREVENTION, Disp: 90 Tab, Rfl: 1  ???  DOC-Q-LACE 100 mg capsule, TAKE ONE CAPSULE BY MOUTH DAILY, Disp: 30 capsule, Rfl: prn  ???  multivitamin with iron (DAILY MULTI-VITAMINS/IRON) tablet, Take 1 tablet by mouth daily., Disp: , Rfl:   ???  calcium-cholecalciferol, D3, (CALCIUM 600 + D) tablet, Take 2 Tabs by mouth daily., Disp: 60 Tab, Rfl: 12  ???  traMADol (ULTRAM) 50 mg tablet, Take 1 Tab by mouth every six (6) hours as needed for Pain. Max Daily Amount: 200 mg., Disp:  60 Tab, Rfl: 1  ???  furosemide (LASIX) 20 mg tablet, Take 1 Tab by mouth as needed. Indications: EDEMA, Disp: 30 Tab, Rfl: 3  ???  nizatidine (AXID) 150 mg cap capsule, , Disp: , Rfl: 2     No Known Allergies     Past Medical History:   Diagnosis Date   ??? Arthritis    ??? CVA (cerebral infarction)    ??? Diabetes (HCC) 01/09/2015    Hemoglobin A1C 6.4 (per records, done at outside facility)   ??? GERD (gastroesophageal reflux disease)    ??? History of recurrent UTIs     on chronic antibiotic prophylaxis, followed by urology   ??? HLD (hyperlipidemia) 01/09/2015    Total Chol 141, Tri 49, HDL 59, LDL 72   ??? Hypertension    ??? Neuropathy    ??? Osteopetrosis    ??? Spinal stenosis      ROS  As stated in HPI    Physical Exam   Constitutional: She is well-developed, well-nourished, and in no distress.    BP 152/84   Pulse 81   Temp 97.4 ??F (36.3 ??C) (Oral)    Resp 16   Ht  (1.397 m)   Wt 118 lb (53.5 kg)   LMP 10/23/1978   SpO2 98%   BMI 27.43 kg/m2    Cardiovascular: Normal rate, regular rhythm, normal heart sounds and intact distal pulses.  Exam reveals no gallop and no friction rub.    No murmur heard.  Pulmonary/Chest: Effort normal and breath sounds normal. No respiratory distress. She has no wheezes. She has no rales.   Abdominal: Soft. Bowel sounds are normal. She exhibits no distension. There is no tenderness. There is no rebound and no guarding.   No CVA tenderness   Vitals reviewed.    Data  UA  Component Results   Component Value Flag Ref Range Units Status   Color (UA POC) Yellow    Final   Clarity (UA POC) Slightly Cloudy    Final   Glucose (UA POC) Negative  Negative  Final   Bilirubin (UA POC) Negative  Negative  Final   Ketones (UA POC) 1+  Negative  Final   Specific gravity (UA POC) 1.025  1.001 - 1.035  Final   Blood (UA POC) Negative  Negative  Final   pH (UA POC) 7  4.6 - 8.0  Final   Protein (UA POC) Trace  Negative mg/dL Final   Urobilinogen (UA POC) 0.2 mg/dL  0.2 - 1  Final   Nitrites (UA POC) Positive  Negative  Final     Assessment/Plan:   Chelsea Schultz is a 82 y.o. female with history of HTN, DM, stress urinary incontinence, urge incontinence, CVA who presents with dysuria.       ICD-10-CM ICD-9-CM    1. Urinary tract infection without hematuria, site unspecified N39.0 599.0 ciprofloxacin HCl (CIPRO) 500 mg tablet   2. Dysuria R30.0 788.1 AMB POC URINALYSIS DIP STICK AUTO W/O MICRO      CULTURE, URINE   3. History of recurrent UTIs Z87.440 V13.02 ciprofloxacin HCl (CIPRO) 500 mg tablet   4. Essential hypertension I10 401.9 candesartan (ATACAND) 32 mg tablet     1. Urinary tract infection without hematuria, site unspecified  Patient with UTI. Recent UTI, hx DM, age 60, will go ahead and treat with Cipro for 14 days   - ciprofloxacin HCl (CIPRO) 500 mg tablet; Take 1 Tab by mouth two (2) times  a day for 14 days.  Dispense: 28 Tab; Refill: 0  - Drink plenty of fluids.  - Symptoms should resolve within 24-48 hours after starting the antibiotic  - If you develop fever, abdominal pain, back pain, or nausea and vomiting then return to clinic or go to the emergency room. This could indicate that you have a more serious infection or/and infection in the kidneys.  - will send for culture  - will need repeat urine culture 1 week after completion of antibiotics    2. Essential hypertension  BP elevated in office today and has been elevated at home and last visit 09/2015. Goal BP <140/90. Is at max dose of CCB, will increase Candesartan.   - candesartan (ATACAND) 32 mg tablet; Take 1 Tab by mouth daily.  Dispense: 90 Tab; Refill: 1    Follow-up Disposition:  Return in about 3 weeks (around 02/01/2016) for follow up UTI and HTN.    I have discussed the diagnosis with the patient and daughter and the intended plan as seen in the above orders.  The patient has received an after-visit summary and questions were answered concerning future plans.  I have discussed medication side effects and warnings with the patient and daughter as well.    Patient seen and discussed with Dr Suanne MarkerAgbeibor,attending physician    Webb Silversmithupali Zienna Ahlin, MD  Family Medicine Resident (PGY-3)  01/11/2016

## 2016-01-11 NOTE — Telephone Encounter (Signed)
Per call from patient daughter on Hipp[employer] Lynden Ang( Cathy) would like office to be informed that insurance company will be reaching out to us about appeal for medications as they haven't heard from our office.    Medications mentioned were trospium and candesartan.    fyi

## 2016-01-11 NOTE — Progress Notes (Signed)
Chief Complaint   Patient presents with   ??? Dysuria     times 5 days     1. Have you been to the ER, urgent care clinic since your last visit?  Hospitalized since your last visit? Yes.  [facility] for Bronchitis. Release has been signed.    2. Have you seen or consulted any other health care providers outside of the [facility] since your last visit?  Include any pap smears or colon screening.  No

## 2016-01-11 NOTE — Telephone Encounter (Signed)
Per call from patient daughter on hippa Carolin Guernsey( Cathy Sledge), asking to be contacted with lab results and states result letter never received.    Call /[phone]    Cell [phone]    thanks

## 2016-01-12 MED ORDER — NATURE-THROID 16.25 MG TABLET
16.25 mg | ORAL_TABLET | ORAL | 0 refills | Status: DC
Start: 2016-01-12 — End: 2016-02-20

## 2016-01-12 NOTE — Telephone Encounter (Signed)
[employer] calling about Urgent Medication Appeal. Call taken by nurse supervisor Ottis StainAnnie K.

## 2016-01-12 NOTE — Telephone Encounter (Signed)
appeal form in your box for signature

## 2016-01-14 ENCOUNTER — Ambulatory Visit: Admit: 2016-01-14 | Discharge: 2016-01-14 | Payer: MEDICARE | Attending: Family Medicine | Primary: Family Medicine

## 2016-01-14 ENCOUNTER — Inpatient Hospital Stay: Admit: 2016-02-04 | Payer: MEDICARE | Primary: Family Medicine

## 2016-01-14 DIAGNOSIS — N390 Urinary tract infection, site not specified: Secondary | ICD-10-CM

## 2016-01-14 NOTE — Patient Instructions (Signed)
Interstitial Lung Disease: Care Instructions  Your Care Instructions  Interstitial lung disease is a long-term (chronic) lung disease. It happens because of damage between the air sacs in the lung. The damage scars the lung and causes breathing problems.  People with interstitial lung disease get breathless during exercise and may have a dry cough. These problems may get worse slowly or very quickly.  Interstitial lung disease can be caused by breathing in dust from asbestos and silica. It also can be caused by infections and some medicines. Sometimes doctors cannot find the cause.  You may get medicine to treat the problem. Corticosteroids can sometimes reduce the swelling of lung tissue and prevent more damage. Oxygen treatment may help your condition.  Follow-up care is a key part of your treatment and safety. Be sure to make and go to all appointments, and call your doctor if you are having problems. It???s also a good idea to know your test results and keep a list of the medicines you take.  How can you care for yourself at home?  ?? Do not smoke. Smoking makes interstitial lung disease worse. If you need help quitting, talk to your doctor about stop-smoking programs and medicines. These can increase your chances of quitting for good.  ?? Take your medicines exactly as prescribed. Call your doctor if you have any problems with your medicine.  ?? Get flu and pneumococcal shots. These help prevent lung infection.  ?? Make an exercise plan with help from your doctor or other health professional. Exercise can help you breathe more easily.  ?? Think about joining a support group. This can help you cope with problems caused by interstitial lung disease.  When should you call for help?  Call your doctor now or seek immediate medical care if:  ?? Your shortness of breath gets worse.  ?? You cough up blood.  ?? You have severe chest pain.  Watch closely for changes in your health, and be sure to contact your  doctor if you have any problems.  Where can you learn more?  Go to [employer].  Enter [phone] in the search box to learn more about "Interstitial Lung Disease: Care Instructions."  Current as of: Mar 15, 2015  Content Version: 11.1  ?? 2006-2016 [employer]. Care instructions adapted under license by [employer] (which disclaims liability or warranty for this information). If you have questions about a medical condition or this instruction, always ask your healthcare professional. [employer] disclaims any warranty or liability for your use of this information.       Diabetes and Preventing Falls: Care Instructions  Your Care Instructions  If you are an older adult who has diabetes, you may have a higher risk of falling. Complications of diabetes???such as nerve damage, foot problems, and reduced vision???may increase your risk of a fall. Some of your medicines also may add to your risk.  By making your home safer, you can lower your risk of falling. Doing things to prevent diabetes complications may also help to lower your risk.  You can make your home safer with a few simple measures.  Follow-up care is a key part of your treatment and safety. Be sure to make and go to all appointments, and call your doctor if you are having problems. It's also a good idea to know your test results and keep a list of the medicines you take.  How can you care for yourself at home?  Taking care of yourself  ??  Keep your blood sugar at a target level (which you set with your doctor).  ?? Exercise regularly to improve your strength, muscle tone, and balance. Walk if you can. Swimming may be a good choice if you cannot walk easily.  ?? Have your vision checked as often as your doctor recommends. It is usually once a year or more often if you have eye problems.  ?? Know the side effects of the medicines you take. Ask your doctor or  pharmacist whether the medicines you take can affect your balance. Sleeping pills or sedatives can affect your balance.  ?? Limit the amount of alcohol you drink. Alcohol can impair your balance and other senses.  ?? Have your doctor check your feet during each visit. If you have a foot problem, see your doctor.  Preventing falls at home  ?? Remove raised doorway thresholds, throw rugs, and clutter. Repair loose carpet or raised areas in the floor.  ?? Move furniture and electrical cords to keep them out of walking paths.  ?? Use nonskid floor wax, and wipe up spills right away, especially on ceramic tile floors.  ?? If you use a walker or cane, put rubber tips on it. If you use crutches, clean the bottoms of them regularly with an abrasive pad, such as steel wool.  ?? Keep your house well lit, especially stairways, porches, and outside walkways. Use night-lights in areas such as hallways and bathrooms. Add extra light switches or use remote switches (such as switches that go on or off when you clap your hands) to make it easier to turn lights on if you have to get up during the night.  ?? Install sturdy handrails on stairways. Put grab bars near your shower, bathtub, and toilet.  ?? Store household items on low shelves so that you do not have to climb or reach high. Or use a reaching device that you can get at a medical supply store. If you have to climb for something, use a step stool with handrails, or ask someone to get it for you.  ?? Keep a cordless phone and a flashlight with new batteries by your bed. If possible, put a phone in each of the main rooms of your house, or carry a cell phone in case you fall and cannot reach a phone. Or you can wear a device around your neck or wrist. You push a button that sends a signal for help.  ?? Wear low-heeled shoes that fit well and give your feet good support. Use footwear with nonskid soles. Check the heels and soles of your shoes for  wear. Repair or replace worn heels or soles.  ?? Do not wear socks without shoes on wood floors.  ?? Walk on the grass when the sidewalks are slippery. If you live in an area that gets snow and ice in the winter, sprinkle salt on slippery steps and sidewalks.  Where can you learn more?  Go to [employer].  Enter [phone] in the search box to learn more about "Diabetes and Preventing Falls: Care Instructions."  Current as of: May 27, 2015  Content Version: 11.1  ?? 2006-2016 [employer]. Care instructions adapted under license by [employer] (which disclaims liability or warranty for this information). If you have questions about a medical condition or this instruction, always ask your healthcare professional. [employer] disclaims any warranty or liability for your use of this information.       Preventing Falls: Care Instructions  Your  Care Instructions  Getting around your home safely can be a challenge if you have injuries or health problems that make it easy for you to fall. Loose rugs and furniture in walkways are among the dangers for many older people who have problems walking or who have poor eyesight. People who have conditions such as arthritis, osteoporosis, or dementia also have to be careful not to fall.  You can make your home safer with a few simple measures.  Follow-up care is a key part of your treatment and safety. Be sure to make and go to all appointments, and call your doctor if you are having problems. It's also a good idea to know your test results and keep a list of the medicines you take.  How can you care for yourself at home?  Taking care of yourself  ?? You may get dizzy if you do not drink enough water. To prevent dehydration, drink plenty of fluids, enough so that your urine is light yellow or clear like water. Choose water and other caffeine-free clear liquids. If you have kidney, heart, or liver disease and have to limit  fluids, talk with your doctor before you increase the amount of fluids you drink.  ?? Exercise regularly to improve your strength, muscle tone, and balance. Walk if you can. Swimming may be a good choice if you cannot walk easily.  ?? Have your vision and hearing checked each year or any time you notice a change. If you have trouble seeing and hearing, you might not be able to avoid objects and could lose your balance.  ?? Know the side effects of the medicines you take. Ask your doctor or pharmacist whether the medicines you take can affect your balance. Sleeping pills or sedatives can affect your balance.  ?? Limit the amount of alcohol you drink. Alcohol can impair your balance and other senses.  ?? Ask your doctor whether calluses or corns on your feet need to be removed. If you wear loose-fitting shoes because of calluses or corns, you can lose your balance and fall.  ?? Talk to your doctor if you have numbness in your feet.  Preventing falls at home  ?? Remove raised doorway thresholds, throw rugs, and clutter. Repair loose carpet or raised areas in the floor.  ?? Move furniture and electrical cords to keep them out of walking paths.  ?? Use nonskid floor wax, and wipe up spills right away, especially on ceramic tile floors.  ?? If you use a walker or cane, put rubber tips on it. If you use crutches, clean the bottoms of them regularly with an abrasive pad, such as steel wool.  ?? Keep your house well lit, especially stairways, porches, and outside walkways. Use night-lights in areas such as hallways and bathrooms. Add extra light switches or use remote switches (such as switches that go on or off when you clap your hands) to make it easier to turn lights on if you have to get up during the night.  ?? Install sturdy handrails on stairways.  ?? Move items in your cabinets so that the things you use a lot are on the lower shelves (about waist level).  ?? Keep a cordless phone and a flashlight with new batteries by your bed.  If possible, put a phone in each of the main rooms of your house, or carry a cell phone in case you fall and cannot reach a phone. Or, you can wear a device around your neck or  wrist. You push a button that sends a signal for help.  ?? Wear low-heeled shoes that fit well and give your feet good support. Use footwear with nonskid soles. Check the heels and soles of your shoes for wear. Repair or replace worn heels or soles.  ?? Do not wear socks without shoes on wood floors.  ?? Walk on the grass when the sidewalks are slippery. If you live in an area that gets snow and ice in the winter, sprinkle salt on slippery steps and sidewalks.  Preventing falls in the bath  ?? Install grab bars and nonskid mats inside and outside your shower or tub and near the toilet and sinks.  ?? Use shower chairs and bath benches.  ?? Use a hand-held shower head that will allow you to sit while showering.  ?? Get into a tub or shower by putting the weaker leg in first. Get out of a tub or shower with your strong side first.  ?? Repair loose toilet seats and consider installing a raised toilet seat to make getting on and off the toilet easier.  ?? Keep your bathroom door unlocked while you are in the shower.  Where can you learn more?  Go to [employer].  Enter G117 in the search box to learn more about "Preventing Falls: Care Instructions."  Current as of: May 27, 2015  Content Version: 11.1  ?? 2006-2016 [employer]. Care instructions adapted under license by [employer] (which disclaims liability or warranty for this information). If you have questions about a medical condition or this instruction, always ask your healthcare professional. [employer] disclaims any warranty or liability for your use of this information.       Muscle Conditioning: Exercises  Your Care Instructions  Here are some examples of exercises for muscle conditioning. Start each  exercise slowly. Ease off the exercise if you start to have pain.  Your doctor or physical therapist will tell you when you can start these exercises and which ones will work best for you.  How to do the exercises  Wall push-ups    1. Stand facing a wall, about 12 to 18 inches away.  2. Place your hands on the wall at shoulder height.  3. Slowly bend your elbows and bring your face toward the wall, moving your hips and shoulders forward together.  4. Push slowly back to the starting position.  5. Start with 5 repetitions and work up to 8 to 12.  6. Rest for a minute, and repeat the exercise.  Note: When you can do this exercise against a wall comfortably (without your muscles feeling tired), you can try it against a counter. Start with 5 repetitions again and work up to 8 to 12. You can then slowly progress to the end of a couch or a sturdy chair, and finally to the floor.  Knee extension    1. While sitting in a chair, straighten one leg and hold while you slowly count to 5. Be sure you do not lock your knee.  2. Repeat 8 to 12 times.  3. Rest for a minute, and repeat the exercise.  4. Do the same exercise with the other leg.  Note: If this exercise becomes easy, you can add a light weight around your ankle or tie an elastic resistance band to a chair leg and one ankle.  Side-lying leg lift    1. Lie on your side, with your legs extended. Keep your hips straight up  and down during this exercise. Do not let your top hip rock toward the back. Support your head with your hand, and place the other hand on the floor near your waist.  2. Slowly raise your upper leg until it is about in line with your shoulder. Keep your toes pointed forward.  3. Slowly lower your leg to the starting position.  4. Repeat 8 to 12 times.  5. Rest for a minute, and repeat the exercise.  6. Turn to your other side and do the same exercise with your other leg.  Note: If this exercise becomes easy, you can add a light weight around  your ankle or tie an elastic resistance band to both ankles.  Shallow standing knee bends    1. Stand with your hands lightly resting on a counter or chair in front of you with your feet shoulder-width apart.  2. Slowly bend your knees so that you squat down just like you were going to sit in a chair. Make sure your knees do not go in front of your toes.  3. Lower yourself about 6 inches. Your heels should remain on the floor at all times.  4. Rise slowly to a standing position.  5. Repeat 8 to 12 times.  6. Rest for a minute, and repeat the exercise.  Follow-up care is a key part of your treatment and safety. Be sure to make and go to all appointments, and call your doctor if you are having problems. It's also a good idea to know your test results and keep a list of the medicines you take.  Where can you learn more?  Go to [employer].  Enter [phone] in the search box to learn more about "Muscle Conditioning: Exercises."  Current as of: Mar 19, 2015  Content Version: 11.1  ?? 2006-2016 [employer]. Care instructions adapted under license by [employer] (which disclaims liability or warranty for this information). If you have questions about a medical condition or this instruction, always ask your healthcare professional. [employer] disclaims any warranty or liability for your use of this information.       Weakness: Care Instructions  Your Care Instructions  Weakness is a lack of physical or muscle strength. You may feel that you need to make extra effort to move your arms, legs, or other muscles. Generalized weakness means that you feel weak in most areas of your body. Another type of weakness may affect just one muscle or group of muscles.  You may feel weak and tired after you have done too much activity, such as taking an extra-long hike. This is not a serious problem. It often goes away on its own.   Feeling weak can also be caused by medical conditions like thyroid problems, depression, or a virus. Sometimes the cause can be serious. Your doctor may want to do more tests to try to find the cause of the weakness.  The doctor has checked you carefully, but problems can develop later. If you notice any problems or new symptoms, get medical treatment right away.  Follow-up care is a key part of your treatment and safety. Be sure to make and go to all appointments, and call your doctor if you are having problems. It's also a good idea to know your test results and keep a list of the medicines you take.  How can you care for yourself at home?  ?? Rest when you feel tired.  ?? Be safe with medicines. If your  doctor prescribed medicine, take it exactly as prescribed. Call your doctor if you think you are having a problem with your medicine. You will get more details on the specific medicines your doctor prescribes.  ?? Do not skip meals. Eating a balanced diet may increase your energy level.  ?? Get some physical activity every day, but do not get too tired.  When should you call for help?  Call your doctor now or seek immediate medical care if:  ?? You have new or worse weakness.  ?? You are dizzy or lightheaded, or you feel like you may faint.  Watch closely for changes in your health, and be sure to contact your doctor if:  ?? You do not get better as expected.  Where can you learn more?  Go to [employer].  Enter [phone] in the search box to learn more about "Weakness: Care Instructions."  Current as of: Mar 19, 2015  Content Version: 11.1  ?? 2006-2016 [employer]. Care instructions adapted under license by [employer] (which disclaims liability or warranty for this information). If you have questions about a medical condition or this instruction, always ask your healthcare professional. [employer] disclaims any warranty or liability for your use of this  information.       Urinary Tract Infection in Women: Care Instructions  Your Care Instructions    A urinary tract infection, or UTI, is a general term for an infection anywhere between the kidneys and the urethra (where urine comes out). Most UTIs are bladder infections. They often cause pain or burning when you urinate.  UTIs are caused by bacteria and can be cured with antibiotics. Be sure to complete your treatment so that the infection goes away.  Follow-up care is a key part of your treatment and safety. Be sure to make and go to all appointments, and call your doctor if you are having problems. It's also a good idea to know your test results and keep a list of the medicines you take.  How can you care for yourself at home?  ?? Take your antibiotics as directed. Do not stop taking them just because you feel better. You need to take the full course of antibiotics.  ?? Drink extra water and other fluids for the next day or two. This may help wash out the bacteria that are causing the infection. (If you have kidney, heart, or liver disease and have to limit fluids, talk with your doctor before you increase your fluid intake.)  ?? Avoid drinks that are carbonated or have caffeine. They can irritate the bladder.  ?? Urinate often. Try to empty your bladder each time.  ?? To relieve pain, take a hot bath or lay a heating pad set on low over your lower belly or genital area. Never go to sleep with a heating pad in place.  To prevent UTIs  ?? Drink plenty of water each day. This helps you urinate often, which clears bacteria from your system. (If you have kidney, heart, or liver disease and have to limit fluids, talk with your doctor before you increase your fluid intake.)  ?? Consider adding cranberry juice to your diet.  ?? Urinate when you need to.  ?? Urinate right after you have sex.  ?? Change sanitary pads often.  ?? Avoid douches, bubble baths, feminine hygiene sprays, and other feminine  hygiene products that have deodorants.  ?? After going to the bathroom, wipe from front to back.  When should you  call for help?  Call your doctor now or seek immediate medical care if:  ?? Symptoms such as fever, chills, nausea, or vomiting get worse or appear for the first time.  ?? You have new pain in your back just below your rib cage. This is called flank pain.  ?? There is new blood or pus in your urine.  ?? You have any problems with your antibiotic medicine.  Watch closely for changes in your health, and be sure to contact your doctor if:  ?? You are not getting better after taking an antibiotic for 2 days.  ?? Your symptoms go away but then come back.  Where can you learn more?  Go to [email or web address][email or web address].  Enter [id number] in the search box to learn more about "Urinary Tract Infection in Women: Care Instructions."  Current as of: June 04, 2015  Content Version: 11.1  ?? 2006-2016 [employer]. Care instructions adapted under license by [employer] (which disclaims liability or warranty for this information). If you have questions about a medical condition or this instruction, always ask your healthcare professional. [employer] disclaims any warranty or liability for your use of this information.

## 2016-01-14 NOTE — Progress Notes (Signed)
Chief Complaint   Patient presents with   ??? Hospital Follow Up     1. Have you been to the ER, urgent care clinic since your last visit?  Hospitalized since your last visit?Yes Where: [facility]    2. Have you [facility] or [facility] any [facility] since your [facility]?  Include any pap smears or colon screening. No

## 2016-01-14 NOTE — Progress Notes (Signed)
82 yo AAF with  has a past medical history of Arthritis; CVA (cerebral infarction); Diabetes (HCC) (01/09/2015); GERD (gastroesophageal reflux disease); History of recurrent UTIs; HLD (hyperlipidemia) (01/09/2015); Hypertension; Neuropathy; Osteopetrosis; and Spinal stenosis.  Presenting for hospital discharge follow up at which time she [place]  At which time she was discharged with the diagnosis of hyponatremia, interstitial lung disease.  Of note she was also mildly hyperthyroid with TSH .29.    They also recommended physical therapy.  No recent falls. She ambulates with a walker.   The interstitial lung disease was a new diagnosis for her as well. Per daughter she had an extensive evaluation which was also done in [place]. Results unable to be reviewed.    Since discharge, she has been doing ok.  However, they noticed some weakness since discharge. Denies any fever, chills, nausea, vomiting, diarrhea, +cough with white sputum.  +dyspnea.    Hx of recurrent UTIs.   Has appt with uro gyn April 10th.   ROS notable for dysuria.   Hypertension. Typically 130-140.  Yesterday 109 sys. Elevated to the 160s.   Past Medical History:   Diagnosis Date   ??? Arthritis    ??? CVA (cerebral infarction)    ??? Diabetes (HCC) 01/09/2015    Hemoglobin A1C 6.4 (per records, done at outside facility)   ??? GERD (gastroesophageal reflux disease)    ??? History of recurrent UTIs     on chronic antibiotic prophylaxis, followed by urology   ??? HLD (hyperlipidemia) 01/09/2015    Total Chol 141, Tri 49, HDL 59, LDL 72   ??? Hypertension    ??? Neuropathy    ??? Osteopetrosis    ??? Spinal stenosis      Past Surgical History:   Procedure Laterality Date   ??? HX ORTHOPAEDIC      Lumbar laminectomy   ??? HX ORTHOPAEDIC      Lumbar fusion     Family History   Problem Relation Age of Onset   ??? Diabetes Mother    ??? Stroke Mother    ??? Hypertension Mother    ??? Hypertension Father    ??? Stroke Father    ??? Cancer Sister    ??? Stroke Sister       Social History     Social History   ??? Marital status: WIDOWED     Spouse name: N/A   ??? Number of children: N/A   ??? Years of education: N/A     Occupational History   ??? Not on file.     Social History Main Topics   ??? Smoking status: Never Smoker   ??? Smokeless tobacco: Never Used   ??? Alcohol use No   ??? Drug use: No   ??? Sexual activity: No     Other Topics Concern   ??? Not on file     Social History Narrative       Current Outpatient Prescriptions:   ???  NATURE-THROID 16.25 mg tab, TAKE 1 TABLET BY MOUTH EVERY DAY, Disp: 30 Tab, Rfl: 0  ???  CYANOCOBALAMIN, VITAMIN B-12, PO, Take  by mouth., Disp: , Rfl:   ???  aspirin delayed-release 81 mg tablet, Take  by mouth daily., Disp: , Rfl:   ???  ciprofloxacin HCl (CIPRO) 500 mg tablet, Take 1 Tab by mouth two (2) times a day for 14 days., Disp: 28 Tab, Rfl: 0  ???  candesartan (ATACAND) 32 mg tablet, Take 1 Tab by mouth  daily., Disp: 90 Tab, Rfl: 1  ???  metFORMIN (GLUCOPHAGE) 500 mg tablet, TAKE 1 TABLET BY MOUTH TWICE DAILY WITH MEALS, Disp: 90 Tab, Rfl: 0  ???  JANUVIA 100 mg tablet, TAKE 1 TABLET BY MOUTH EVERY DAY, Disp: 90 Tab, Rfl: 0  ???  atorvastatin (LIPITOR) 10 mg tablet, TAKE 1 TABLET BY MOUTH DAILY, Disp: 30 Tab, Rfl: 3  ???  polyethylene glycol (MIRALAX) 17 gram/dose powder, TAKE 17GM AS DIRECTED ONCE DAILY, Disp: 765 g, Rfl: 0  ???  traMADol (ULTRAM) 50 mg tablet, Take 1 Tab by mouth every six (6) hours as needed for Pain. Max Daily Amount: 200 mg., Disp: 60 Tab, Rfl: 1  ???  furosemide (LASIX) 20 mg tablet, Take 1 Tab by mouth as needed. Indications: EDEMA, Disp: 30 Tab, Rfl: 3  ???  trospium (SANCTURA) 20 mg tablet, Take 20 mg by mouth Before breakfast, lunch, and dinner. Indications: BLADDER HYPERACTIVITY, Disp: , Rfl: 0  ???  nizatidine (AXID) 150 mg cap capsule, , Disp: , Rfl: 2  ???  ascorbic acid (VITAMIN C) 1,000 mg tablet, Take  by mouth., Disp: , Rfl:   ???  pregabalin (LYRICA) 100 mg capsule, Take 1 Cap by mouth daily. Max  Daily Amount: 100 mg. Indications: DIABETIC PERIPHERAL NEUROPATHY, NEUROPATHIC PAIN ASSOCIATED WITH SPINAL CORD INJURY, Disp: 90 Cap, Rfl: 3  ???  amLODIPine (NORVASC) 10 mg tablet, Take 1 Tab by mouth daily. Indications: HYPERTENSION, Disp: 90 Tab, Rfl: 3  ???  clopidogrel (PLAVIX) 75 mg tablet, Take 1 Tab by mouth daily. Indications: CEREBRAL THROMBOEMBOLISM PREVENTION, Disp: 90 Tab, Rfl: 1  ???  DOC-Q-LACE 100 mg capsule, TAKE ONE CAPSULE BY MOUTH DAILY, Disp: 30 capsule, Rfl: prn  ???  multivitamin with iron (DAILY MULTI-VITAMINS/IRON) tablet, Take 1 tablet by mouth daily., Disp: , Rfl:   ???  calcium-cholecalciferol, D3, (CALCIUM 600 + D) tablet, Take 2 Tabs by mouth daily., Disp: 60 Tab, Rfl: 12  No Known Allergies    ROS: Pertinent ROS performed and negative except as mentioned in the HPI.     Visit Vitals   ??? BP 170/82   ??? Pulse 95   ??? Temp 97.4 ??F (36.3 ??C) (Oral)   ??? Resp 16   ??? Ht  (1.397 m)   ??? Wt 118 lb 12.8 oz (53.9 kg)   ??? SpO2 98%   ??? BMI 27.61 kg/m2     PE:   General appearance - alert, well appearing, and in no distress and normal appearing weight  Chest - clear to auscultation with bibasilar crackles without resp distress or tachypnea or hypoxia.   Heart - normal rate, regular rhythm, normal S1, S2, no murmurs, rubs, clicks or gallops  Abdomen - soft, nontender, nondistended, no masses or organomegaly  bowel sounds normal, no CVAT bil  A/P:  Damika was seen today for hospital follow up.    Diagnoses and all orders for this visit:    Recurrent UTI-urine culture from 3/21 pending. Repeat today. Has appt with urology in about 1-2 wks. Prev on ppx but advised to discontinue due to her labs.    -     CULTURE, URINE    Weakness-has home health agency she works with. Daughter to schedule appointment.   -     REFERRAL TO HOME HEALTH    Interstitial lung disease (HCC)-new diagnosis. Advised to follow up with records from [place] to review. In the interim placed referral to pulm.    -  REFERRAL TO PULMONARY DISEASE    I have discussed the diagnosis with the patient and the intended plan as seen in the above orders. The patient has received an after-visit summary and questions were answered concerning future plans. I have discussed medication side effects and warnings with the patient as well. Informed pt to return to the office if symptoms worsen or if new symptoms arise.

## 2016-01-15 LAB — CULTURE, URINE

## 2016-01-16 MED ORDER — NITROFURANTOIN (25% MACROCRYSTAL FORM) 100 MG CAP
100 mg | ORAL_CAPSULE | Freq: Two times a day (BID) | ORAL | 0 refills | Status: AC
Start: 2016-01-16 — End: 2016-01-23

## 2016-01-16 NOTE — Telephone Encounter (Signed)
Attempted to call patient regarding culture result. Unable to reach. Left voicemail to call back.     Will discontinue Cipro as resistant and start macrobid. Repeat urine culture from 3/24 pending.     Will also send mychart message as unable to reach.     Webb Silversmithupali Sander Remedios, MD

## 2016-01-17 LAB — CULTURE, URINE

## 2016-01-18 ENCOUNTER — Encounter: Admit: 2016-01-18 | Discharge: 2016-01-18 | Primary: Family Medicine

## 2016-01-18 NOTE — Progress Notes (Signed)
Call:  Your urine culture was positive for E Coli. It is currently sensitive to the antibiotic that as given on the 26th. Please complete the course.  Return after the course to make sure the infection is cleared.

## 2016-01-18 NOTE — Telephone Encounter (Addendum)
Spoke with patients daughter Chelsea Schultz on [facility] regarding wanting to know if home health referral and progress note can be sent to agency that pt has been using. Patients daughter stated the agency they use needs the referral and progress note of the day pt saw Dr. Orvan Falconerampbell on 01/14/16 to be faxed to there faculty. Patients daughter stated it needs to be faxed to [facility] to this fax number [phone]. Notified daughter that it would be faxed. Notified patients daughter Chelsea Schultz on [facility] of patients urine culture results per Dr. Orvan Falconerampbell. Notified patients daughter that urine culture was positive E Coli and is currently sensitive to   antibiotic given on 01/16/16. Notified patients daughter that pt needs to complete course . Advised per doctor recommendations once antibiotic is completed to follow up with doctor to make sure infection is clear.  Patients daughter verbalized understanding.

## 2016-01-18 NOTE — Telephone Encounter (Signed)
Faxed referral for home health and progress note from visit on 01/14/16 with Dr.Campbell as requested by patients daughter Lynden AngCathy on [facility] today (01-18-16).

## 2016-01-18 NOTE — Telephone Encounter (Signed)
Patient calling about home health information. Upon review I have seen there was a message left from Nurse Lauren about patient calling back.  Did not find information about home health but did transfer call to Nurse Leotis ShamesLauren

## 2016-01-18 NOTE — Telephone Encounter (Signed)
Attempted to call pt regarding urine culture results per Dr.Campbell. Left voicemail for pt to return call back.

## 2016-01-19 NOTE — Telephone Encounter (Signed)
Nurse aware.

## 2016-01-19 NOTE — Telephone Encounter (Signed)
Fax rec'd was handed to nurse.

## 2016-01-19 NOTE — Telephone Encounter (Signed)
Spoke with Alma from Home health regarding concerns about referral for home health was suppose to be faxed to them . Alma stated that she [facility] and issue was resolved.

## 2016-01-19 NOTE — Telephone Encounter (Signed)
[place],     Ph [phone], [facility]  call to say rec'd an order for PT and OT from this office.    Want to make sure that [facility] is suppose to have this order due to she is saying the order has it on for [name] to administer these services.    She will fax letter of this information to this office.    Informed her that I would ask for advice regarding her concern.

## 2016-01-19 NOTE — Telephone Encounter (Signed)
[facility] called back stating she had resolved this concern.

## 2016-01-20 ENCOUNTER — Encounter: Primary: Family Medicine

## 2016-01-25 MED ORDER — POLYETHYLENE GLYCOL 3350 100 % ORAL POWDER
17 gram/dose | ORAL | 0 refills | Status: DC
Start: 2016-01-25 — End: 2016-11-16

## 2016-02-04 ENCOUNTER — Encounter: Attending: Family Medicine | Primary: Family Medicine

## 2016-02-20 ENCOUNTER — Encounter

## 2016-02-22 ENCOUNTER — Encounter

## 2016-02-23 MED ORDER — CLOPIDOGREL 75 MG TAB
75 mg | ORAL_TABLET | ORAL | 0 refills | Status: DC
Start: 2016-02-23 — End: 2016-06-06

## 2016-02-23 NOTE — Telephone Encounter (Signed)
John with [facility] is calling, notes that patient's PT had been on hold because patient had to go with another care giver.    Asking for a 2 week extension on PT    CALL [phone]

## 2016-02-23 NOTE — Telephone Encounter (Signed)
Attempted to call [facility] from [facility] regarding patients  2 week extension of physical therapy. Left voicemail for John from [facility] to return call.

## 2016-02-24 MED ORDER — NATURE-THROID 16.25 MG TABLET
16.25 mg | ORAL_TABLET | ORAL | 0 refills | Status: DC
Start: 2016-02-24 — End: 2016-06-06

## 2016-02-24 MED ORDER — METFORMIN 500 MG TAB
500 mg | ORAL_TABLET | ORAL | 0 refills | Status: DC
Start: 2016-02-24 — End: 2016-04-04

## 2016-02-25 NOTE — Telephone Encounter (Signed)
Attempted to call Jonny RuizJohn from [facility] per Dr. Orvan Falconerampbell regarding patients requesting a 2 week extension for physical therapy. Left voicemail for Jonny RuizJohn from [facility] to return call.

## 2016-02-29 ENCOUNTER — Encounter

## 2016-02-29 NOTE — Telephone Encounter (Signed)
Spoke with Chelsea Schultz from [facility] regarding requesting 2 week extension for physical therapy. Chelsea Schultz stated that a script needed to be written and [facility] by Dr. Orvan Falconerampbell for approval. Chelsea Schultz stated script for extended physical therapy can be sent to [phone]. RX for 2 week extension was written by Dr. Orvan Falconerampbell and sent to be faxed today ( 02/29/16).

## 2016-02-29 NOTE — Telephone Encounter (Signed)
Spoke with Theodoro [facility]istave from [facility] who stated that pt was recommended to be evaluated for another two weeks by PT . Theodoro GristDave stated that due to pt being out of town for two more weeks due to a death in the family the pt is unable to be evaluated. Theodoro GristDave stated that pt was notified that she will need to follow up with Dr. Orvan Falconerampbell. Theodoro GristDave stated once pt has followed up that a new referral can be placed and pt will be assisted with the PT she needs.

## 2016-02-29 NOTE — Telephone Encounter (Signed)
Per call from Call Center [facility]( Carla) notes that Theodoro Gristave with [facility] was calling and have questions about a order?  Called was disconnected.    Call [phone]

## 2016-03-06 ENCOUNTER — Ambulatory Visit: Admit: 2016-03-06 | Discharge: 2016-03-06 | Payer: MEDICARE | Attending: Adult Medicine | Primary: Family Medicine

## 2016-03-06 ENCOUNTER — Inpatient Hospital Stay: Admit: 2016-03-29 | Payer: MEDICARE | Primary: Family Medicine

## 2016-03-06 DIAGNOSIS — R350 Frequency of micturition: Secondary | ICD-10-CM

## 2016-03-06 MED ORDER — PREGABALIN 100 MG CAP
100 mg | ORAL_CAPSULE | Freq: Every day | ORAL | 3 refills | Status: DC
Start: 2016-03-06 — End: 2016-12-05

## 2016-03-06 MED ORDER — ATORVASTATIN 10 MG TAB
10 mg | ORAL_TABLET | Freq: Every day | ORAL | 3 refills | Status: DC
Start: 2016-03-06 — End: 2017-03-13

## 2016-03-06 NOTE — Progress Notes (Signed)
I reviewed with the resident the medical history and the resident's findings on the physical examination.  I discussed with the resident the patient's diagnosis and concur with the plan.

## 2016-03-06 NOTE — Progress Notes (Signed)
Chelsea FilbertBlanche H Inniss  82 y.o. female  Mar 16, 1926  [id number]  [place] [id number]  [id number]   [facility]  Chelsea LevineJuan C Taya Ashbaugh, MD       Encounter Date: 03/06/2016    Chief Complaint   Patient presents with   ??? Urinary Frequency     History of Present Illness   Chelsea Schultz is a 82 y.o. female who presents to clinic today for follow up on UTI. Patient was started on Macrobid for e. Coli UTI back on 01/14/16. Patient is doing well today. No dysuria or burning sensation with urination but noticed some increase in urination few days ago. Patient also needs refill of Lipitor and Lyrica. No other complaints today.      Review of Systems   ROS: Positives per HPI  No fevers, chills, unexpected weight changes. Normal appetite and bowel function. No chest pain or pressure, no new dyspnea on exertion.    Vitals/Objective:     Vitals:    03/06/16 1351   BP: 134/75   Pulse: 71   Resp: 18   Temp: 98 ??F (36.7 ??C)   TempSrc: Oral   SpO2: 98%   Weight: 121 lb (54.9 kg)   Height: 4\' 7"  (1.397 m)     Body mass index is 28.12 kg/(m^2).    Physical Exam  General appearance - alert, well appearing, and in no distress, in wheelchair  Eyes - conjunctiva clear  Mouth - mucous membranes moist, pharynx normal without lesions  Neck - supple, no significant adenopathy   Chest - clear to auscultation, no wheezes, rales or rhonchi, symmetric air entry  Heart - normal rate, regular rhythm, normal S1, S2, no murmurs, rubs, clicks or gallops  Abdomen - soft, nontender, nondistended, no masses or organomegaly  Extremities - peripheral pulses normal, no pedal edema, no clubbing or cyanosis  Skin - normal coloration and turgor, no rashes, no suspicious skin lesions noted    Assessment and Plan:   1. Urinary frequency/History of UTI  Will check urine culture and treat base on results.  - CULTURE, URINE    3. Hyperlipidemia, unspecified hyperlipidemia type  Refilled   - atorvastatin (LIPITOR) 10 mg tablet; Take 1 Tab by mouth daily.  Dispense: 90 Tab; Refill: 3    4. Neuropathy  Refilled  - pregabalin (LYRICA) 100 mg capsule; Take 1 Cap by mouth daily. Max Daily Amount: 100 mg. Indications: DIABETIC PERIPHERAL NEUROPATHY, NEUROPATHIC PAIN ASSOCIATED WITH SPINAL CORD INJURY  Dispense: 90 Cap; Refill: 3    I have discussed the diagnosis with the patient and the intended plan as seen in the above orders.  she has expressed understanding.  The patient has received an after-visit summary and questions were answered concerning future plans.  I have discussed medication side effects and warnings with the patient as well.    Follow-up Disposition:  Return in about 1 week (around 03/13/2016) for With Dr. Andria MeuseStevens for Medicare wellness visit.    Electronically Signed: Erline LevineJuan C Samuella Rasool, MD     History   Patients past medical, surgical and family histories were reviewed and updated.    Past Medical History:   Diagnosis Date   ??? Arthritis    ??? CVA (cerebral infarction)    ??? Diabetes (HCC) 01/09/2015    Hemoglobin A1C 6.4 (per records, done at outside facility)   ??? GERD (gastroesophageal reflux disease)    ??? History of recurrent UTIs  on chronic antibiotic prophylaxis, followed by urology   ??? HLD (hyperlipidemia) 01/09/2015    Total Chol 141, Tri 49, HDL 59, LDL 72   ??? Hypertension    ??? Neuropathy    ??? Osteopetrosis    ??? Spinal stenosis      Past Surgical History:   Procedure Laterality Date   ??? HX ORTHOPAEDIC      Lumbar laminectomy   ??? HX ORTHOPAEDIC      Lumbar fusion     Family History   Problem Relation Age of Onset   ??? Diabetes Mother    ??? Stroke Mother    ??? Hypertension Mother    ??? Hypertension Father    ??? Stroke Father    ??? Cancer Sister    ??? Stroke Sister      Social History     Social History   ??? Marital status: WIDOWED     Spouse name: N/A   ??? Number of children: N/A   ??? Years of education: N/A     Occupational History   ??? Not on file.     Social History Main Topics    ??? Smoking status: Never Smoker   ??? Smokeless tobacco: Never Used   ??? Alcohol use No   ??? Drug use: No   ??? Sexual activity: No     Other Topics Concern   ??? Not on file     Social History Narrative            Current Medications/Allergies     Current Outpatient Prescriptions   Medication Sig Dispense Refill   ??? atorvastatin (LIPITOR) 10 mg tablet Take 1 Tab by mouth daily. 90 Tab 3   ??? pregabalin (LYRICA) 100 mg capsule Take 1 Cap by mouth daily. Max Daily Amount: 100 mg. Indications: DIABETIC PERIPHERAL NEUROPATHY, NEUROPATHIC PAIN ASSOCIATED WITH SPINAL CORD INJURY 90 Cap 3   ??? NATURE-THROID 16.25 mg tab TAKE 1 TABLET BY MOUTH EVERY DAY 30 Tab 0   ??? metFORMIN (GLUCOPHAGE) 500 mg tablet TAKE 1 TABLET BY MOUTH TWICE DAILY WITH MEALS 90 Tab 0   ??? clopidogrel (PLAVIX) 75 mg tab TAKE 1 TABLET BY MOUTH EVERY DAY 90 Tab 0   ??? polyethylene glycol (MIRALAX) 17 gram/dose powder TAKE 17GM AS DIRECTED ONCE DAILY 765 g 0   ??? CYANOCOBALAMIN, VITAMIN B-12, PO Take  by mouth.     ??? aspirin delayed-release 81 mg tablet Take  by mouth daily.     ??? candesartan (ATACAND) 32 mg tablet Take 1 Tab by mouth daily. 90 Tab 1   ??? metFORMIN (GLUCOPHAGE) 500 mg tablet TAKE 1 TABLET BY MOUTH TWICE DAILY WITH MEALS 90 Tab 0   ??? JANUVIA 100 mg tablet TAKE 1 TABLET BY MOUTH EVERY DAY 90 Tab 0   ??? furosemide (LASIX) 20 mg tablet Take 1 Tab by mouth as needed. Indications: EDEMA 30 Tab 3   ??? trospium (SANCTURA) 20 mg tablet Take 20 mg by mouth Before breakfast, lunch, and dinner. Indications: BLADDER HYPERACTIVITY  0   ??? nizatidine (AXID) 150 mg cap capsule   2   ??? ascorbic acid (VITAMIN C) 1,000 mg tablet Take  by mouth.     ??? amLODIPine (NORVASC) 10 mg tablet Take 1 Tab by mouth daily. Indications: HYPERTENSION 90 Tab 3   ??? DOC-Q-LACE 100 mg capsule TAKE ONE CAPSULE BY MOUTH DAILY 30 capsule prn   ??? multivitamin with iron (DAILY MULTI-VITAMINS/IRON) tablet Take 1 tablet by mouth  daily.      ??? calcium-cholecalciferol, D3, (CALCIUM 600 + D) tablet Take 2 Tabs by mouth daily. 60 Tab 12   ??? polyethylene glycol (MIRALAX) 17 gram/dose powder TAKE 17GM AS DIRECTED ONCE DAILY 765 g 0     No Known Allergies

## 2016-03-06 NOTE — Progress Notes (Signed)
Urine culture sensitive to Macrobid. Will send course of 100 mg BID x 7 days.

## 2016-03-08 LAB — CULTURE, URINE

## 2016-03-13 ENCOUNTER — Ambulatory Visit: Payer: MEDICARE | Primary: Family Medicine

## 2016-03-21 ENCOUNTER — Ambulatory Visit: Payer: MEDICARE | Primary: Family Medicine

## 2016-04-03 ENCOUNTER — Inpatient Hospital Stay: Payer: MEDICARE | Attending: Critical Care Medicine | Primary: Family Medicine

## 2016-04-04 ENCOUNTER — Encounter

## 2016-04-05 MED ORDER — JANUVIA 100 MG TABLET
100 mg | ORAL_TABLET | ORAL | 0 refills | Status: DC
Start: 2016-04-05 — End: 2016-07-10

## 2016-04-05 MED ORDER — METFORMIN 500 MG TAB
500 mg | ORAL_TABLET | ORAL | 0 refills | Status: DC
Start: 2016-04-05 — End: 2016-06-06

## 2016-04-21 ENCOUNTER — Inpatient Hospital Stay: Admit: 2016-04-21 | Payer: MEDICARE | Attending: Critical Care Medicine | Primary: Family Medicine

## 2016-04-21 DIAGNOSIS — J849 Interstitial pulmonary disease, unspecified: Secondary | ICD-10-CM

## 2016-05-25 NOTE — Telephone Encounter (Signed)
Patient needs a refill of the following  Requested Prescriptions     Pending Prescriptions Disp Refills   ??? polyethylene glycol (MIRALAX) 17 gram/dose powder 765 g 0

## 2016-05-26 MED ORDER — POLYETHYLENE GLYCOL 3350 100 % ORAL POWDER
17 gram/dose | ORAL | 0 refills | Status: DC
Start: 2016-05-26 — End: 2016-07-15

## 2016-06-06 ENCOUNTER — Encounter

## 2016-06-06 MED ORDER — NATURE-THROID 16.25 MG TABLET
16.25 mg | ORAL_TABLET | ORAL | 0 refills | Status: DC
Start: 2016-06-06 — End: 2016-07-10

## 2016-06-06 MED ORDER — METFORMIN 500 MG TAB
500 mg | ORAL_TABLET | ORAL | 0 refills | Status: DC
Start: 2016-06-06 — End: 2016-07-10

## 2016-06-06 MED ORDER — CLOPIDOGREL 75 MG TAB
75 mg | ORAL_TABLET | Freq: Every day | ORAL | 0 refills | Status: DC
Start: 2016-06-06 — End: 2016-08-09

## 2016-06-06 NOTE — Telephone Encounter (Signed)
Appt required before next refill.   Due for labs.

## 2016-06-06 NOTE — Telephone Encounter (Signed)
Requested Prescriptions     Pending Prescriptions Disp Refills   ??? NATURE-THROID 16.25 mg tab 30 Tab 0   ??? metFORMIN (GLUCOPHAGE) 500 mg tablet 90 Tab 0   ??? clopidogrel (PLAVIX) 75 mg tab 90 Tab 0     Please address asap   Chelsea Schultz called stating the pharmacy has been sending us requests for these medications for two weeks and this has not been addressed yet. This is the first request we have received and the patient is now out of medication.

## 2016-06-07 NOTE — Telephone Encounter (Signed)
Patient needs a refill of the following  Requested Prescriptions     Pending Prescriptions Disp Refills   ??? nizatidine (AXID) 150 mg cap capsule  2

## 2016-06-07 NOTE — Telephone Encounter (Signed)
Voicemail left for patient to return call. Patient needs to have a medicare wellness visit and up to date labs done as soon as possible.

## 2016-06-14 NOTE — Telephone Encounter (Signed)
Called patient's friend Ms.Waddell listed on [employer] regarding medication. She stated that patient was overdue for medicare wellness and they could handle the refill then as well. Ms. Mordecai MaesWaddell understood and will relay message to patient as well.

## 2016-06-19 ENCOUNTER — Encounter: Attending: Family Medicine | Primary: Family Medicine

## 2016-07-02 ENCOUNTER — Encounter

## 2016-07-02 NOTE — Telephone Encounter (Signed)
Patient needs a refill of the following  Requested Prescriptions     Pending Prescriptions Disp Refills   ??? JANUVIA 100 mg tablet 90 Tab 0

## 2016-07-03 NOTE — Telephone Encounter (Signed)
Voicemail left for patient to return call as a diabetic follow-up appointment is needed for medication refill.

## 2016-07-10 ENCOUNTER — Ambulatory Visit: Admit: 2016-07-10 | Discharge: 2016-07-10 | Payer: MEDICARE | Attending: Sports Medicine | Primary: Family Medicine

## 2016-07-10 ENCOUNTER — Encounter

## 2016-07-10 ENCOUNTER — Inpatient Hospital Stay: Admit: 2016-09-05 | Payer: MEDICARE | Primary: Family Medicine

## 2016-07-10 DIAGNOSIS — R300 Dysuria: Secondary | ICD-10-CM

## 2016-07-10 LAB — AMB POC URINALYSIS DIP STICK AUTO W/O MICRO
Bilirubin (UA POC): NEGATIVE
Blood (UA POC): NEGATIVE
Glucose (UA POC): NEGATIVE
Ketones (UA POC): NEGATIVE
Leukocyte esterase (UA POC): NEGATIVE
Nitrites (UA POC): NEGATIVE
Protein (UA POC): NEGATIVE mg/dL
Specific gravity (UA POC): 1.025 (ref 1.001–1.035)
Urobilinogen (UA POC): 0.2 (ref 0.2–1)
pH (UA POC): 6.5 (ref 4.6–8.0)

## 2016-07-10 MED ORDER — NITROFURANTOIN (25% MACROCRYSTAL FORM) 100 MG CAP
100 mg | ORAL_CAPSULE | Freq: Two times a day (BID) | ORAL | 0 refills | Status: AC
Start: 2016-07-10 — End: 2016-07-15

## 2016-07-10 MED ORDER — METFORMIN 500 MG TAB
500 mg | ORAL_TABLET | ORAL | 0 refills | Status: DC
Start: 2016-07-10 — End: 2016-09-25

## 2016-07-10 MED ORDER — FLUCONAZOLE 150 MG TAB
150 mg | ORAL_TABLET | Freq: Every day | ORAL | 0 refills | Status: AC
Start: 2016-07-10 — End: 2016-07-11

## 2016-07-10 MED ORDER — NATURE-THROID 16.25 MG TABLET
16.25 mg | ORAL_TABLET | ORAL | 0 refills | Status: DC
Start: 2016-07-10 — End: 2016-12-01

## 2016-07-10 MED ORDER — NIZATIDINE 150 MG CAP
150 mg | ORAL_CAPSULE | Freq: Every evening | ORAL | 0 refills | Status: DC
Start: 2016-07-10 — End: 2016-12-01

## 2016-07-10 MED ORDER — JANUVIA 100 MG TABLET
100 mg | ORAL_TABLET | ORAL | 0 refills | Status: DC
Start: 2016-07-10 — End: 2016-08-15

## 2016-07-10 NOTE — ACP (Advance Care Planning) (Signed)
Patient received Advanced Directive forms

## 2016-07-10 NOTE — Progress Notes (Signed)
Chelsea Schultz is a 82 y.o. female who presents for dysuria, urgency and frequency for a couple of days.  H/o recurrent UTIs.  No abd pain.  No back pain.  No n/v.  No self treatments.     She also needs some of her chronic medications refilled today.     Meds:   Current Outpatient Prescriptions   Medication Sig Dispense Refill   ??? nitrofurantoin, macrocrystal-monohydrate, (MACROBID) 100 mg capsule Take 1 Cap by mouth two (2) times a day for 5 days. 10 Cap 0   ??? JANUVIA 100 mg tablet TAKE 1 TABLET BY MOUTH EVERY DAY 30 Tab 0   ??? NATURE-THROID 16.25 mg tab TAKE 1 TABLET BY MOUTH EVERY DAY 30 Tab 0   ??? nizatidine (AXID) 150 mg cap capsule Take 1 Cap by mouth nightly. 30 Cap 0   ??? metFORMIN (GLUCOPHAGE) 500 mg tablet TAKE 1 TABLET BY MOUTH TWICE DAILY WITH MEALS 60 Tab 0   ??? clopidogrel (PLAVIX) 75 mg tab Take 1 Tab by mouth daily. 90 Tab 0   ??? polyethylene glycol (MIRALAX) 17 gram/dose powder TAKE 17GM AS DIRECTED ONCE DAILY 765 g 0   ??? atorvastatin (LIPITOR) 10 mg tablet Take 1 Tab by mouth daily. 90 Tab 3   ??? polyethylene glycol (MIRALAX) 17 gram/dose powder TAKE 17GM AS DIRECTED ONCE DAILY 765 g 0   ??? CYANOCOBALAMIN, VITAMIN B-12, PO Take  by mouth.     ??? aspirin delayed-release 81 mg tablet Take  by mouth daily.     ??? candesartan (ATACAND) 32 mg tablet Take 1 Tab by mouth daily. 90 Tab 1   ??? furosemide (LASIX) 20 mg tablet Take 1 Tab by mouth as needed. Indications: EDEMA 30 Tab 3   ??? trospium (SANCTURA) 20 mg tablet Take 20 mg by mouth Before breakfast, lunch, and dinner. Indications: BLADDER HYPERACTIVITY  0   ??? ascorbic acid (VITAMIN C) 1,000 mg tablet Take  by mouth.     ??? amLODIPine (NORVASC) 10 mg tablet Take 1 Tab by mouth daily. Indications: HYPERTENSION 90 Tab 3   ??? DOC-Q-LACE 100 mg capsule TAKE ONE CAPSULE BY MOUTH DAILY 30 capsule prn   ??? multivitamin with iron (DAILY MULTI-VITAMINS/IRON) tablet Take 1 tablet by mouth daily.     ??? calcium-cholecalciferol, D3, (CALCIUM 600 + D) tablet Take 2 Tabs by  mouth daily. 60 Tab 12   ??? pregabalin (LYRICA) 100 mg capsule Take 1 Cap by mouth daily. Max Daily Amount: 100 mg. Indications: DIABETIC PERIPHERAL NEUROPATHY, NEUROPATHIC PAIN ASSOCIATED WITH SPINAL CORD INJURY 90 Cap 3       Allergies:   No Known Allergies    Smoker:  History   Smoking Status   ??? Never Smoker   Smokeless Tobacco   ??? Never Used       ETOH:   History   Alcohol Use No       FH:   Family History   Problem Relation Age of Onset   ??? Diabetes Mother    ??? Stroke Mother    ??? Hypertension Mother    ??? Hypertension Father    ??? Stroke Father    ??? Cancer Sister    ??? Stroke Sister        ROS:  General/Constitutional:   No headache or fever     Cardiac:    No chest pain      Respiratory:   No cough or shortness of breath  Back: No pain  GI:   No nausea/vomiting or abdominal pain       GU:   Dysuria, urgency and frequency but no hematuria    Skin: No rash     Physical Exam:  Visit Vitals   ??? BP (!) 168/94 (BP 1 Location: Right arm, BP Patient Position: Sitting)   ??? Pulse 93   ??? Temp 98.3 ??F (36.8 ??C) (Oral)   ??? Resp 16   ??? Ht 4\' 7"  (1.397 m)   ??? Wt 121 lb (54.9 kg)   ??? LMP 10/23/1978   ??? SpO2 97%   ??? BMI 28.12 kg/m2     Gen: NAD.  Responds to all questions appropriately.  Eye: Conjunctiva are clear.  Lungs: No labored respirations. CTAB.   CV: RRR; no m/r/g  Back: No CVA tenderness  Abdomen: +BS. Soft. Nontender. No rebound or guarding  Ext: Moving all ext equally.    Lab:  Recent Results (from the past 12 hour(s))   AMB POC URINALYSIS DIP STICK AUTO W/O MICRO    Collection Time: 07/10/16  6:37 PM   Result Value Ref Range    Color (UA POC) Yellow     Clarity (UA POC) Clear     Glucose (UA POC) Negative Negative    Bilirubin (UA POC) Negative Negative    Ketones (UA POC) Negative Negative    Specific gravity (UA POC) 1.025 1.001 - 1.035    Blood (UA POC) Negative Negative    pH (UA POC) 6.5 4.6 - 8.0    Protein (UA POC) Negative Negative mg/dL    Urobilinogen (UA POC) 0.2 mg/dL 0.2 - 1     Nitrites (UA POC) Negative Negative    Leukocyte esterase (UA POC) Negative Negative         Assessment:    ICD-10-CM ICD-9-CM    1. Dysuria R30.0 788.1 AMB POC URINALYSIS DIP STICK AUTO W/O MICRO      CULTURE, URINE   2. Controlled type 2 diabetes mellitus without complication, without long-term current use of insulin (HCC) E11.9 250.00 JANUVIA 100 mg tablet      metFORMIN (GLUCOPHAGE) 500 mg tablet   UA negative but sx consistent with prior UTIs.  She has had UA that were WNL in the past and positive cx.  Will send cx to verify.  Prior cx and sensitivities reviewed.  Will prescribed macrobid as the last 2 cultures were E.coli sensitive to macrobid.     Plan:  Macrobid BID for 5 days.  Urine Cx pending.  Chronic medication refilled from 1 month as above.  Discussed that she would need a f/u visit for labs prior to additional refills.  She already has a f/u appointment scheduled to review her chronic health issues.  Patient requesting diflucan as she often gets yeast infections with antibiotics.  She will only use if sx develop.   General instruction:  1. Drink plenty of fluids.  2. You can use Uristat or Azostandard to help with symptoms.  These medications can turn your urine an orange color.  Symptoms should resolve within 24-48 hours after starting the antibiotic.  3. Follow-up if symptoms not improving in 2-3 days.  4. If you develop fever, abdomenal pain, back pain, or nausea and vomiting then return to clinic or go to the emergency room.  This could indicate that you have a more serious infection or and infection in the kidneys.

## 2016-07-10 NOTE — ACP (Advance Care Planning) (Signed)
Patient received Advanced Directive forms

## 2016-07-11 ENCOUNTER — Encounter: Attending: Family Medicine | Primary: Family Medicine

## 2016-07-13 LAB — CULTURE, URINE

## 2016-07-17 MED ORDER — POLYETHYLENE GLYCOL 3350 100 % ORAL POWDER
17 gram/dose | ORAL | 0 refills | Status: DC
Start: 2016-07-17 — End: 2017-01-08

## 2016-07-18 NOTE — Telephone Encounter (Signed)
-----   Message from Rolin [facility] sent at 07/18/2016  1:31 PM EDT -----  Regarding: Dr.Bahnge/telephone  Mrs.Mordecai MaesWaddell is requesting in regards to Friday apt for a couple questions/concerns. Best contact [phone].    Urgent

## 2016-07-21 ENCOUNTER — Ambulatory Visit: Admit: 2016-07-21 | Discharge: 2016-07-21 | Payer: MEDICARE | Attending: Family Medicine | Primary: Family Medicine

## 2016-07-21 ENCOUNTER — Inpatient Hospital Stay: Admit: 2016-09-08 | Payer: MEDICARE | Primary: Family Medicine

## 2016-07-21 DIAGNOSIS — Z0000 Encounter for general adult medical examination without abnormal findings: Secondary | ICD-10-CM

## 2016-07-21 MED ORDER — CONJUGATED ESTROGENS 0.625 MG/G VAGINAL CREAM
0.625 mg/gram | Freq: Every day | VAGINAL | 1 refills | Status: DC
Start: 2016-07-21 — End: 2016-12-01

## 2016-07-21 MED ORDER — CETIRIZINE 10 MG TAB
10 mg | ORAL_TABLET | Freq: Every evening | ORAL | 0 refills | Status: DC
Start: 2016-07-21 — End: 2017-02-26

## 2016-07-21 NOTE — Progress Notes (Signed)
Date of visit: 07/21/2016       This is an Subsequent Medicare Annual Wellness Visit (AWV), (Performed more than 12 months after effective date of Medicare Part B enrollment and 12 months after last preventive visit)    I have reviewed the patient's medical history in detail and updated the computerized patient record.     Chelsea Schultz is a 82 y.o. female   History obtained from: child and the patient.    Concerns today   (Patient understands that medical problems addressed today may incur additional cost as this is a preventive visit)  - Repeated urinary tract infections  - Productive cough for 1 month  - Diabetic, needs labs    History     Patient Active Problem List   Diagnosis Code   ??? HTN (hypertension) I10   ??? Diabetes (HCC) E11.9   ??? 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 Vulvar atrophy N90.5   ??? SUI (stress urinary incontinence, female) N39.3   ??? Urgency incontinence N39.41   ??? UTI (urinary tract infection) N39.0   ??? HTN (hypertension), benign I10   ??? DM type 2 (diabetes mellitus, type 2) (HCC) E11.9   ??? Acute CVA (cerebrovascular accident) (HCC) I63.9     Past Medical History:   Diagnosis Date   ??? Arthritis    ??? CVA (cerebral infarction)    ??? Diabetes (HCC) 01/09/2015    Hemoglobin A1C 6.4 (per records, done at outside facility)   ??? GERD (gastroesophageal reflux disease)    ??? History of recurrent UTIs     on chronic antibiotic prophylaxis, followed by urology   ??? HLD (hyperlipidemia) 01/09/2015    Total Chol 141, Tri 49, HDL 59, LDL 72   ??? Hypertension    ??? Neuropathy (HCC)    ??? Osteopetrosis    ??? Spinal stenosis       Past Surgical History:   Procedure Laterality Date   ??? HX ORTHOPAEDIC      Lumbar laminectomy   ??? HX ORTHOPAEDIC      Lumbar fusion     No Known Allergies  Current Outpatient Prescriptions   Medication Sig Dispense Refill   ??? polyethylene glycol (MIRALAX) 17 gram/dose powder TAKE 17GM AS DIRECTED ONCE DAILY 765 g 0   ??? JANUVIA 100 mg tablet TAKE 1 TABLET BY MOUTH EVERY DAY 30 Tab 0    ??? NATURE-THROID 16.25 mg tab TAKE 1 TABLET BY MOUTH EVERY DAY 30 Tab 0   ??? nizatidine (AXID) 150 mg cap capsule Take 1 Cap by mouth nightly. 30 Cap 0   ??? metFORMIN (GLUCOPHAGE) 500 mg tablet TAKE 1 TABLET BY MOUTH TWICE DAILY WITH MEALS 60 Tab 0   ??? clopidogrel (PLAVIX) 75 mg tab Take 1 Tab by mouth daily. 90 Tab 0   ??? atorvastatin (LIPITOR) 10 mg tablet Take 1 Tab by mouth daily. 90 Tab 3   ??? pregabalin (LYRICA) 100 mg capsule Take 1 Cap by mouth daily. Max Daily Amount: 100 mg. Indications: DIABETIC PERIPHERAL NEUROPATHY, NEUROPATHIC PAIN ASSOCIATED WITH SPINAL CORD INJURY 90 Cap 3   ??? polyethylene glycol (MIRALAX) 17 gram/dose powder TAKE 17GM AS DIRECTED ONCE DAILY 765 g 0   ??? CYANOCOBALAMIN, VITAMIN B-12, PO Take  by mouth.     ??? aspirin delayed-release 81 mg tablet Take  by mouth daily.     ??? candesartan (ATACAND) 32 mg tablet Take 1 Tab by mouth daily. 90 Tab 1   ??? furosemide (LASIX)  20 mg tablet Take 1 Tab by mouth as needed. Indications: EDEMA 30 Tab 3   ??? trospium (SANCTURA) 20 mg tablet Take 20 mg by mouth Before breakfast, lunch, and dinner. Indications: BLADDER HYPERACTIVITY  0   ??? ascorbic acid (VITAMIN C) 1,000 mg tablet Take  by mouth.     ??? amLODIPine (NORVASC) 10 mg tablet Take 1 Tab by mouth daily. Indications: HYPERTENSION 90 Tab 3   ??? DOC-Q-LACE 100 mg capsule TAKE ONE CAPSULE BY MOUTH DAILY 30 capsule prn   ??? multivitamin with iron (DAILY MULTI-VITAMINS/IRON) tablet Take 1 tablet by mouth daily.     ??? calcium-cholecalciferol, D3, (CALCIUM 600 + D) tablet Take 2 Tabs by mouth daily. 60 Tab 12     Family History   Problem Relation Age of Onset   ??? Diabetes Mother    ??? Stroke Mother    ??? Hypertension Mother    ??? Hypertension Father    ??? Stroke Father    ??? Cancer Sister    ??? Stroke Sister      Social History   Substance Use Topics   ??? Smoking status: Never Smoker   ??? Smokeless tobacco: Never Used   ??? Alcohol use No       Specialists/Care Team    Omelia H. Mayford KnifeWilliams has established care with the following healthcare providers:  Patient Care Team:  Selina CooleyMaria C Stevens, MD as PCP - General (Family Practice)  Pauline Goodracey C Powell, RN as Nurse Navigator    Health Risk Assessment     Demographics   female  82 y.o.    General Health Questions   -During the past 4 weeks:   -how would you rate your health in general? Good   -how often have you been bothered by feeling dizzy when standing up? never   -how much have you been bothered by bodily pain? not   -Have you noticed any hearing difficulties? no   -has your physical and emotional health limited your social activities with family or friends? no    Emotional Health Questions   -Do you have a history of depression, anxiety, or emotional problems? no  -Over the past 2 weeks, have you felt down, depressed or hopeless? no  -Over the past 2 weeks, have you felt little interest or pleasure in doing things? no    Health Habits   Please describe your diet habits: well-balanced  Do you get 5 servings of fruits or vegetables daily? yes  Do you exercise regularly? yes    Activities of Daily Living and Functional Status   -Do you need help with eating, walking, dressing, bathing, toileting, the phone, transportation, shopping, preparing meals, housework, laundry, medications or managing money? no  -In the past four weeks, was someone available to help you if you needed and wanted help with anything? yes  -Are you confident are you that you can control and manage most of your health problems? yes  -Have you been given information to help you keep track of your medications? yes  -How often do you have trouble taking your medications as prescribed? never    Fall Risk and Home Safety   Have you fallen 2 or more times in the past year? no  Does your home have rugs in the hallway, lack grab bars in the bathroom, lack handrails on the stairs or have poor lighting? no  Do you have smoke detectors and check them regularly? yes   Do you have difficulties driving a  car? no  Do you always fasten your seat belt when you are in a car? yes    Review of Systems (if indicated for problems addressed today)       Physical Examination     Vitals:    07/21/16 1009   BP: 174/90   Pulse: 97   Resp: 18   Temp: 97.7 ??F (36.5 ??C)   TempSrc: Oral   SpO2: 97%   Weight: 121 lb (54.9 kg)   Height: 4\' 7"  (1.397 m)     Body mass index is 28.12 kg/(m^2).   No exam data present  Was the patient's timed Up & Go test unsteady or longer than 30 seconds? no    Evaluation of Cognitive Function   Mood/affect:  neutral  Orientation: Person, Place, Time and Situation  Appearance: age appropriate    Additional exam if indicated for problems addressed today:  See separate note    Advice/Referrals/Counseling (as indicated)   Education and counseling provided for any problems identified above: none    Preventive Services     Health Maintenance   Topic Date Due   ??? FOOT EXAM Q1  04/18/1936   ??? EYE EXAM RETINAL OR DILATED Q1  04/18/1936   ??? DTaP/Tdap/Td series (1 - Tdap) 04/18/1949   ??? ZOSTER VACCINE AGE 6>  02/17/1988   ??? GLAUCOMA SCREENING Q2Y  04/18/1993   ??? Pneumococcal 65+ Low/Medium Risk (1 of 2 - PCV13) 04/18/1993   ??? MEDICARE YEARLY EXAM  04/18/1993   ??? MICROALBUMIN Q1  05/23/2013   ??? HEMOGLOBIN A1C Q6M  07/15/2015   ??? LIPID PANEL Q1  01/12/2016   ??? INFLUENZA AGE 64 TO ADULT  05/23/2016   ??? OSTEOPOROSIS SCREENING (DEXA)  Completed      (Preventive care checklist to be included in patient instructions)  Discussed today Done Previously Not Needed    X, declined   Pneumococcal vaccines   X, declined   Flu vaccine     x Hepatitis B vaccine (if at risk)   X, declined   Shingles vaccine   X, declined   TDAP vaccine     x Mammogram     x Pap smear     x Colorectal cancer screening     x Low-dose CT for lung cancer screening     x Bone density test     x Glaucoma screening   x   Cholesterol test   x   Diabetes screening test    x   Diabetes self-management class    x   Nutritionist referral for diabetes or renal disease     Discussion of Advance Directive   Discussed with Zakeya H. Isais her ability to prepare and advance directive in the case that an injury or illness causes her to be unable to make health care decisions.     Assessment/Plan   Z00.00    ICD-10-CM ICD-9-CM    1. Burning with urination R30.0 788.1 REFERRAL TO UROLOGY      conjugated estrogens (PREMARIN) 0.625 mg/gram vaginal cream   2. Medicare annual wellness visit, subsequent Z00.00 V70.0 CBC W/O DIFF      METABOLIC PANEL, COMPREHENSIVE   3. Type 2 diabetes mellitus without complication, without long-term current use of insulin (HCC) E11.9 250.00 HEMOGLOBIN A1C WITH EAG      LIPID PANEL   4. Essential hypertension I10 401.9    5. Post-nasal drip R09.82 784.91 cetirizine (ZYRTEC) 10 mg tablet  No orders of the defined types were placed in this encounter.      Follow-up Disposition:  Return in about 3 months (around 10/20/2016) for diabetes check.    Zack Seal, MD

## 2016-07-21 NOTE — Progress Notes (Signed)
Chief Complaint   Patient presents with   ??? Annual Wellness Visit     medicare wellness,    ??? Bladder Infection     dysuria     1. Have you been to the ER, urgent care clinic since your last visit?  Hospitalized since your last visit?No    2. Have you seen or consulted any other health care providers outside [facility] since your last visit?  Include any pap smears or colon screening. No

## 2016-07-21 NOTE — Progress Notes (Signed)
Subjective  Chelsea Schultz is an 82 y.o. female here to discuss urinary burning, productive cough for 1 month, labs.    Urinary burning: treated with macrobid at last visit for urinary burning but no frequency or fever. Culture showed E coli < 100 k. Patient reports persistent symptoms despite completing antibiotic.    Cough: reports nasal congestion, productive cough for 1 month. No fever, weight loss, night sweats. No hx of smoking.     Patient notes that she hasn't had labs done for diabetes, HTN in some time.     Did not take BP medication this morning.    Allergies - reviewed:   No Known Allergies      Medications - reviewed:   Current Outpatient Prescriptions   Medication Sig   ??? conjugated estrogens (PREMARIN) 0.625 mg/gram vaginal cream Insert 0.5 g into vagina daily.   ??? cetirizine (ZYRTEC) 10 mg tablet Take 1 Tab by mouth nightly.   ??? polyethylene glycol (MIRALAX) 17 gram/dose powder TAKE 17GM AS DIRECTED ONCE DAILY   ??? JANUVIA 100 mg tablet TAKE 1 TABLET BY MOUTH EVERY DAY   ??? NATURE-THROID 16.25 mg tab TAKE 1 TABLET BY MOUTH EVERY DAY   ??? nizatidine (AXID) 150 mg cap capsule Take 1 Cap by mouth nightly.   ??? metFORMIN (GLUCOPHAGE) 500 mg tablet TAKE 1 TABLET BY MOUTH TWICE DAILY WITH MEALS   ??? clopidogrel (PLAVIX) 75 mg tab Take 1 Tab by mouth daily.   ??? atorvastatin (LIPITOR) 10 mg tablet Take 1 Tab by mouth daily.   ??? pregabalin (LYRICA) 100 mg capsule Take 1 Cap by mouth daily. Max Daily Amount: 100 mg. Indications: DIABETIC PERIPHERAL NEUROPATHY, NEUROPATHIC PAIN ASSOCIATED WITH SPINAL CORD INJURY   ??? polyethylene glycol (MIRALAX) 17 gram/dose powder TAKE 17GM AS DIRECTED ONCE DAILY   ??? CYANOCOBALAMIN, VITAMIN B-12, PO Take  by mouth.   ??? aspirin delayed-release 81 mg tablet Take  by mouth daily.   ??? candesartan (ATACAND) 32 mg tablet Take 1 Tab by mouth daily.   ??? furosemide (LASIX) 20 mg tablet Take 1 Tab by mouth as needed. Indications: EDEMA    ??? trospium (SANCTURA) 20 mg tablet Take 20 mg by mouth Before breakfast, lunch, and dinner. Indications: BLADDER HYPERACTIVITY   ??? ascorbic acid (VITAMIN C) 1,000 mg tablet Take  by mouth.   ??? amLODIPine (NORVASC) 10 mg tablet Take 1 Tab by mouth daily. Indications: HYPERTENSION   ??? DOC-Q-LACE 100 mg capsule TAKE ONE CAPSULE BY MOUTH DAILY   ??? multivitamin with iron (DAILY MULTI-VITAMINS/IRON) tablet Take 1 tablet by mouth daily.   ??? calcium-cholecalciferol, D3, (CALCIUM 600 + D) tablet Take 2 Tabs by mouth daily.     No current facility-administered medications for this visit.          Past Medical History - reviewed:  Past Medical History:   Diagnosis Date   ??? Arthritis    ??? CVA (cerebral infarction)    ??? Diabetes (HCC) 01/09/2015    Hemoglobin A1C 6.4 (per records, done at outside facility)   ??? GERD (gastroesophageal reflux disease)    ??? History of recurrent UTIs     on chronic antibiotic prophylaxis, followed by urology   ??? HLD (hyperlipidemia) 01/09/2015    Total Chol 141, Tri 49, HDL 59, LDL 72   ??? Hypertension    ??? Neuropathy (HCC)    ??? Osteopetrosis    ??? Spinal stenosis          Past  Surgical History - reviewed:   Past Surgical History:   Procedure Laterality Date   ??? HX ORTHOPAEDIC      Lumbar laminectomy   ??? HX ORTHOPAEDIC      Lumbar fusion         Social History - reviewed:  Social History     Social History   ??? Marital status: WIDOWED     Spouse name: N/A   ??? Number of children: N/A   ??? Years of education: N/A     Occupational History   ??? Not on file.     Social History Main Topics   ??? Smoking status: Never Smoker   ??? Smokeless tobacco: Never Used   ??? Alcohol use No   ??? Drug use: No   ??? Sexual activity: No     Other Topics Concern   ??? Not on file     Social History Narrative         Family History - reviewed:  Family History   Problem Relation Age of Onset   ??? Diabetes Mother    ??? Stroke Mother    ??? Hypertension Mother    ??? Hypertension Father    ??? Stroke Father    ??? Cancer Sister    ??? Stroke Sister           Immunizations - reviewed:   Immunization History   Administered Date(s) Administered   ??? TB Skin Test (PPD) Intradermal 08/11/2013         ROS  CONSTITUTIONAL: Denies: fever  ENT: nasal discharge  CARDIOVASCULAR: Denies: chest pain  RESPIRATORY: cough  GU: dysuria      Physical Exam  Visit Vitals   ??? BP 174/90 (BP 1 Location: Right arm, BP Patient Position: Sitting)   ??? Pulse 97   ??? Temp 97.7 ??F (36.5 ??C) (Oral)   ??? Resp 18   ??? Ht 4\' 7"  (1.397 m)   ??? Wt 121 lb (54.9 kg)   ??? LMP 10/23/1978   ??? SpO2 97%   ??? BMI 28.12 kg/m2       General appearance - alert, well appearing, and in no distress  Eyes - EOMI  Nose - mucosal congestion, clear rhinorrhea  Mouth - mucous membranes moist, pharynx normal without lesions  Neck - supple, no significant adenopathy  Chest - clear to auscultation, no wheezes, rales or rhonchi, symmetric air entry  Heart - normal rate, regular rhythm, normal S1, S2, no murmurs, rubs, clicks or gallops  Abdomen - soft, nontender, nondistended, no masses or organomegaly  Neurological - alert, oriented, normal speech, no focal findings or movement disorder noted  Skin - normal coloration and turgor, no rashes, no suspicious skin lesions noted      Assessment/Plan    ICD-10-CM ICD-9-CM    1. Medicare annual wellness visit, subsequent Z00.00 V70.0 CBC W/O DIFF      METABOLIC PANEL, COMPREHENSIVE   2. Burning with urination R30.0 788.1 REFERRAL TO UROLOGY      conjugated estrogens (PREMARIN) 0.625 mg/gram vaginal cream   3. Type 2 diabetes mellitus without complication, without long-term current use of insulin (HCC) E11.9 250.00 HEMOGLOBIN A1C WITH EAG      LIPID PANEL   4. Essential hypertension I10 401.9    5. Post-nasal drip R09.82 784.91 cetirizine (ZYRTEC) 10 mg tablet       Burning with urination: likely not related to an infection as treatment with adequate antibiotic for the same symptoms at last visit did  not provide any relief. More likely related to vaginal atrophy. Will start  estrogen cream and monitor for symptom improvement.    Type 2 diabetes: is on [place] and metformin but has not been getting labs for some time. Will do routine labs today.    BP very high today. Patient did not take medication this morning. Needs to f/u in 2 weeks for BP recheck.    Cough: likely related to PND. Will treat with zyrtec and flonase and see if symptoms improve. If they do not, will order CT chest given slightly enlarged lymph nodes seen on CT chest 04/21/16.     Follow-up Disposition:  Return in about 3 months (around 10/20/2016) for diabetes check.      I have discussed the diagnosis with the patient and the intended plan as seen in the above orders.  The patient has received an after-visit summary and questions were answered concerning future plans.  I have discussed medication side effects and warnings with the patient as well.      Zack Seal, MD  Family Medicine Resident    Patient discussed with Dr. Honor Junes, attending physician.

## 2016-07-21 NOTE — Progress Notes (Signed)
I reviewed the patient's medical history, the resident's findings on physical examination, the patient's diagnoses, and treatment plan as documented in the resident note.  I concur with the treatment plan as documented.  Additional suggestions noted.

## 2016-07-22 LAB — METABOLIC PANEL, COMPREHENSIVE
A-G Ratio: 1.4 (ref 1.2–2.2)
ALT (SGPT): 17 IU/L (ref 0–32)
AST (SGOT): 24 IU/L (ref 0–40)
Albumin: 4 g/dL (ref 3.5–4.7)
Alk. phosphatase: 67 IU/L (ref 39–117)
BUN/Creatinine ratio: 17 (ref 12–28)
BUN: 12 mg/dL (ref 8–27)
Bilirubin, total: 0.3 mg/dL (ref 0.0–1.2)
CO2: 26 mmol/L (ref 18–29)
Calcium: 10.6 mg/dL — ABNORMAL HIGH (ref 8.7–10.3)
Chloride: 99 mmol/L (ref 96–106)
Creatinine: 0.71 mg/dL (ref 0.57–1.00)
GFR est AA: 88 mL/min/{1.73_m2} (ref >59)
GFR est non-AA: 76 mL/min/{1.73_m2} (ref >59)
GLOBULIN, TOTAL: 2.9 g/dL (ref 1.5–4.5)
Glucose: 95 mg/dL (ref 65–99)
Potassium: 4.9 mmol/L (ref 3.5–5.2)
Protein, total: 6.9 g/dL (ref 6.0–8.5)
Sodium: 137 mmol/L (ref 134–144)

## 2016-07-22 LAB — LIPID PANEL
Cholesterol, total: 124 mg/dL (ref 100–199)
HDL Cholesterol: 59 mg/dL (ref >39)
LDL, calculated: 53 mg/dL (ref 0–99)
Triglyceride: 58 mg/dL (ref 0–149)
VLDL, calculated: 12 mg/dL (ref 5–40)

## 2016-07-22 LAB — HEMOGLOBIN A1C WITH EAG
Estimated average glucose: 126 mg/dL
Hemoglobin A1c: 6 % — ABNORMAL HIGH (ref 4.8–5.6)

## 2016-07-22 LAB — CBC W/O DIFF
HCT: 39.9 % (ref 34.0–46.6)
HGB: 12.6 g/dL (ref 11.1–15.9)
MCH: 27.2 pg (ref 26.6–33.0)
MCHC: 31.6 g/dL (ref 31.5–35.7)
MCV: 86 fL (ref 79–97)
PLATELET: 268 10*3/uL (ref 150–379)
RBC: 4.63 x10E6/uL (ref 3.77–5.28)
RDW: 13.8 % (ref 12.3–15.4)
WBC: 10.6 10*3/uL (ref 3.4–10.8)

## 2016-07-22 LAB — DIABETES PATIENT EDUCATION

## 2016-07-22 LAB — CVD REPORT

## 2016-07-22 NOTE — Telephone Encounter (Signed)
Spoke with Chelsea Schultz, concerns addressed at appointment for patient. Will call back for anything further.

## 2016-07-23 ENCOUNTER — Encounter

## 2016-07-23 NOTE — Telephone Encounter (Signed)
Per fax from pharmacy, patient needs a refill of the following    Requested Prescriptions     Pending Prescriptions Disp Refills   ??? candesartan (ATACAND) 32 mg tablet 90 Tab 1     Sig: Take 1 Tab by mouth daily.

## 2016-07-28 MED ORDER — CANDESARTAN 32 MG TAB
32 mg | ORAL_TABLET | Freq: Every day | ORAL | 1 refills | Status: DC
Start: 2016-07-28 — End: 2016-10-28

## 2016-08-03 NOTE — Progress Notes (Signed)
Persistently elevated calcium. Needs repeat Ca, PTH, and 24 hour urine calcium labs completed. Improved A1c.

## 2016-08-09 ENCOUNTER — Encounter

## 2016-08-09 NOTE — Telephone Encounter (Signed)
Patient needs a refill of the following  Requested Prescriptions     Pending Prescriptions Disp Refills   ??? amLODIPine (NORVASC) 10 mg tablet 90 Tab 3     Sig: Take 1 Tab by mouth daily. Indications: hypertension

## 2016-08-10 MED ORDER — AMLODIPINE 10 MG TAB
10 mg | ORAL_TABLET | Freq: Every day | ORAL | 3 refills | Status: DC
Start: 2016-08-10 — End: 2017-08-18

## 2016-08-11 MED ORDER — CLOPIDOGREL 75 MG TAB
75 mg | ORAL_TABLET | ORAL | 0 refills | Status: DC
Start: 2016-08-11 — End: 2016-11-26

## 2016-08-14 ENCOUNTER — Encounter

## 2016-08-14 NOTE — Telephone Encounter (Signed)
Medication chosen for refill.

## 2016-08-14 NOTE — Telephone Encounter (Signed)
-----   Message from Ellamae SiaAndre Burnett sent at 08/11/2016  5:31 PM EDT -----  Regarding: Dr. Ramonita LabJackson/Telephone  [name] Waddell request a call back in regards to getting an authorization for the pt's "Juniva" medication. She states that the pt is completely out of her medication and that she is also out of town. Her best contact number is [phone].

## 2016-08-14 NOTE — Telephone Encounter (Signed)
Medication Detail      Disp Refills Start End      JANUVIA 100 mg tablet 30 Tab 0 07/10/2016     Sig: TAKE 1 TABLET BY MOUTH EVERY DAY    E-Prescribing Status: Receipt confirmed by pharmacy (07/10/2016 ??7:17 PM EDT)      Authorizing Provider:  Penni BombardJeffrey B Roberts, MD DEA #:  [phone]

## 2016-08-15 ENCOUNTER — Encounter

## 2016-08-15 MED ORDER — JANUVIA 100 MG TABLET
100 mg | ORAL_TABLET | ORAL | 0 refills | Status: DC
Start: 2016-08-15 — End: 2016-08-15

## 2016-08-15 NOTE — Telephone Encounter (Signed)
Pt daughter notified new rx sent to pharmacy per Dr. Gordy LevanBahng

## 2016-08-15 NOTE — Telephone Encounter (Signed)
Patient daughter Jola Babinski([name]) on hippa calling to speak with nurse about medication refusal.    Nurse Henderson Newcomerheresa C. took

## 2016-08-19 MED ORDER — JANUVIA 100 MG TABLET
100 mg | ORAL_TABLET | ORAL | 0 refills | Status: DC
Start: 2016-08-19 — End: 2016-09-08

## 2016-09-04 ENCOUNTER — Encounter

## 2016-09-08 MED ORDER — JANUVIA 100 MG TABLET
100 mg | ORAL_TABLET | ORAL | 0 refills | Status: DC
Start: 2016-09-08 — End: 2016-10-15

## 2016-09-25 ENCOUNTER — Encounter

## 2016-09-25 MED ORDER — METFORMIN 500 MG TAB
500 mg | ORAL_TABLET | ORAL | 0 refills | Status: DC
Start: 2016-09-25 — End: 2016-11-11

## 2016-10-15 ENCOUNTER — Encounter

## 2016-10-18 NOTE — Telephone Encounter (Signed)
Duplicate request

## 2016-10-19 MED ORDER — JANUVIA 100 MG TABLET
100 mg | ORAL_TABLET | ORAL | 0 refills | Status: DC
Start: 2016-10-19 — End: 2016-11-11

## 2016-10-28 ENCOUNTER — Encounter

## 2016-11-10 MED ORDER — CANDESARTAN 32 MG TAB
32 mg | ORAL_TABLET | ORAL | 0 refills | Status: DC
Start: 2016-11-10 — End: 2017-01-29

## 2016-11-11 ENCOUNTER — Encounter

## 2016-11-14 MED ORDER — METFORMIN 500 MG TAB
500 mg | ORAL_TABLET | ORAL | 0 refills | Status: DC
Start: 2016-11-14 — End: 2016-12-29

## 2016-11-15 NOTE — Telephone Encounter (Signed)
Patient daughter would like to be contacted as to reason for denial of medication and notes this medication is needed.    Refused Medication Requests        POLYETHYLENE GLYCOL 3350 17 gram/dose TAKE 17GM AS DIRECTED ONCE DAILY      Medication List

## 2016-11-15 NOTE — Telephone Encounter (Signed)
[facility] Conn[facility] all access [phone]     Message from NanafaliaEnvera today at 12:50 pm    Dr. Darel HongBanghe/Refill  Received: Today ??   ?? [name]  P [facility] Front Office ??   ??    ??    ??   ??   Pt daughter Chelsea Schultz wants to know why the pts RX for miralax was declined. She can be reached at [phone]    ??   ??

## 2016-11-16 MED ORDER — JANUVIA 100 MG TABLET
100 mg | ORAL_TABLET | ORAL | 0 refills | Status: DC
Start: 2016-11-16 — End: 2016-12-01

## 2016-11-16 MED ORDER — POLYETHYLENE GLYCOL 3350 100 % ORAL POWDER
17 gram/dose | ORAL | 0 refills | Status: DC
Start: 2016-11-16 — End: 2016-12-01

## 2016-11-20 NOTE — Telephone Encounter (Signed)
Patient daughter Lucius Conn( Cathy Fullbright Sledge) on hippa calling for status of previous message. Lynden AngCathy informed that medications were addressed and refilled as of 11/16/16 for miralax and also Venezuelajanuvia.

## 2016-11-22 NOTE — Telephone Encounter (Signed)
Please call pharmacy as medication not there.  Patient is out of medication    Call daughter at Michiel SitesMarilyn Waddell all access [phone]        JANUVIA 100 mg tablet [[id number]][[id number]]   Order Details   Dose, Route, Frequency: As Directed    Dispense Quantity:  30 Tab Refills:  0 Fills Remaining:  --    ??      Sig: TAKE 1 TABLET BY MOUTH EVERY DAY   ??      Written Date:  11/16/16 Expiration Date:  --     Start Date:  11/16/16 End Date:  --     ??      Ordering Provider:  -- DEA #:  -- NPI:  --    Authorizing Provider:  Zack SealEdward Bahng, MD DEA #:  [id number]:  [id number] NPI:  [id number]    Ordering User:  Zack SealEdward Bahng, MD            Diagnosis Association: Controlled type 2 diabetes mellitus without complication, without long-term current use of insulin (HCC) (E11.9)      Original Order:  JANUVIA 100 mg tablet [[id number]][[id number]]      [id number] [place] - [id number] [facility] [address] DEA #:  [id number]:  [id number]     Pharmacy Comments:  --   ??

## 2016-11-24 NOTE — Telephone Encounter (Signed)
Informed by pharmacy of need for prior auth for januvia vs using onglyza or tradjenta as an alternative. Spoke to daughter who prefers prior [place] as patient endured long process to get to current medication regimen. Daughter to call pharmacy and have them fax forms to start that process.

## 2016-11-24 NOTE — Telephone Encounter (Signed)
Patient's daughter calling in I confirmed this med was sent and received by the pharmacy on 11/16/16, thank you.

## 2016-11-26 ENCOUNTER — Encounter

## 2016-11-29 ENCOUNTER — Encounter

## 2016-11-29 MED ORDER — CLOPIDOGREL 75 MG TAB
75 mg | ORAL_TABLET | ORAL | 0 refills | Status: DC
Start: 2016-11-29 — End: 2016-12-01

## 2016-11-29 NOTE — Telephone Encounter (Signed)
Patient needs a refill of the following  Requested Prescriptions     Pending Prescriptions Disp Refills   ??? nizatidine (AXID) 150 mg cap capsule 30 Cap 0     Sig: Take 1 Cap by mouth nightly.

## 2016-12-01 ENCOUNTER — Ambulatory Visit: Admit: 2016-12-01 | Discharge: 2016-12-01 | Payer: MEDICARE | Attending: Family Medicine | Primary: Family Medicine

## 2016-12-01 ENCOUNTER — Inpatient Hospital Stay: Admit: 2017-01-19 | Payer: MEDICARE | Primary: Family Medicine

## 2016-12-01 ENCOUNTER — Encounter

## 2016-12-01 DIAGNOSIS — E119 Type 2 diabetes mellitus without complications: Secondary | ICD-10-CM

## 2016-12-01 MED ORDER — JANUVIA 100 MG TABLET
100 mg | ORAL_TABLET | ORAL | 0 refills | Status: DC
Start: 2016-12-01 — End: 2017-02-26

## 2016-12-01 MED ORDER — CLOPIDOGREL 75 MG TAB
75 mg | ORAL_TABLET | ORAL | 0 refills | Status: DC
Start: 2016-12-01 — End: 2017-02-26

## 2016-12-01 MED ORDER — NIZATIDINE 150 MG CAP
150 mg | ORAL_CAPSULE | Freq: Every evening | ORAL | 0 refills | Status: DC
Start: 2016-12-01 — End: 2017-02-26

## 2016-12-01 MED ORDER — CONJUGATED ESTROGENS 0.625 MG/G VAGINAL CREAM
0.625 mg/gram | Freq: Every day | VAGINAL | 1 refills | Status: DC
Start: 2016-12-01 — End: 2017-08-13

## 2016-12-01 MED ORDER — POLYETHYLENE GLYCOL 3350 100 % ORAL POWDER
17 gram/dose | ORAL | 0 refills | Status: DC
Start: 2016-12-01 — End: 2017-01-08

## 2016-12-01 NOTE — Progress Notes (Signed)
Chief Complaint   Patient presents with   ??? Medication Refill   ??? Diabetes     Visit Vitals   ??? Ht 4\' 7"  (1.397 m)     1. Have you been to the ER, urgent care clinic since your last visit?  [facility]    2. Have you seen or consulted any other health care providers outside of the [facility] since your last visit?  Include any pap smears or colon screening. No

## 2016-12-01 NOTE — Telephone Encounter (Addendum)
1. Daughter Chelsea Schultz was asking about a form for Chelsea Schultz that she states was dropped off for you to complete for the patient.    This form is needed for her first day there on Wednesday 12/06/16. We cannot find this form in the office. Do you have it?        2. Daughter Chelsea Schultz was here on Friday 12/01/16 asking about a letter she had asked you for back on the October 2017 visit. She was asking for a letter or something so she can get diabetic shoes made for the patient.    This request cannot be found in her chart.          Please call Chelsea Schultz to discuss getting this for her mother as soon as possible.

## 2016-12-01 NOTE — Progress Notes (Signed)
Subjective  CC: Chelsea Schultz is an 83 y.o. female presents for medication refills.    She has a history of Diabetes, CVA, and vaginal atrophy. All well controlled per patient and caregiver who was present during visit.     Diabetes  Last A1c was 6.0% Sept 2017. She is on Venezuelajanuvia and metformin. Compliant on these medications. No issues or concerns per patient or caregiver.    CVA  Patient on plavix. Symtpoms resolved. Completes most of ADLS and IADLs. Ambulates with walker. Did have a fall a week or so ago, fell asleep in chair and kind of slid out of chair. Denies LOC or any residual effects. No visual issues. No pain with ambulating. May have hit head but no knot on head or ecchymosis afterwards    Vaginal Atrophy  States her symptoms are controlled on premarin. No symptoms currently. Denies dysuria.    Allergies - reviewed:   No Known Allergies      Medications - reviewed:   Current Outpatient Prescriptions   Medication Sig   ??? lactobacillus comb no.10 (PROBIOTIC) 20 billion cell cap Take  by mouth.   ??? JANUVIA 100 mg tablet TAKE 1 TABLET BY MOUTH EVERY DAY   ??? clopidogrel (PLAVIX) 75 mg tab TAKE 1 TABLET BY MOUTH DAILY   ??? nizatidine (AXID) 150 mg cap capsule Take 1 Cap by mouth nightly.   ??? polyethylene glycol (MIRALAX) 17 gram/dose powder TAKE 17GM AS DIRECTED ONCE DAILY   ??? conjugated estrogens (PREMARIN) 0.625 mg/gram vaginal cream Insert 0.5 g into vagina daily.   ??? metFORMIN (GLUCOPHAGE) 500 mg tablet TAKE 1 TABLET BY MOUTH TWICE DAILY WITH MEALS   ??? candesartan (ATACAND) 32 mg tablet TAKE 1 TABLET BY MOUTH DAILY   ??? amLODIPine (NORVASC) 10 mg tablet Take 1 Tab by mouth daily. Indications: hypertension   ??? polyethylene glycol (MIRALAX) 17 gram/dose powder TAKE 17GM AS DIRECTED ONCE DAILY   ??? atorvastatin (LIPITOR) 10 mg tablet Take 1 Tab by mouth daily.   ??? pregabalin (LYRICA) 100 mg capsule Take 1 Cap by mouth daily. Max Daily Amount: 100 mg. Indications: DIABETIC PERIPHERAL NEUROPATHY, NEUROPATHIC  PAIN ASSOCIATED WITH SPINAL CORD INJURY   ??? aspirin delayed-release 81 mg tablet Take  by mouth daily.   ??? furosemide (LASIX) 20 mg tablet Take 1 Tab by mouth as needed. Indications: EDEMA   ??? trospium (SANCTURA) 20 mg tablet Take 20 mg by mouth Before breakfast, lunch, and dinner. Indications: BLADDER HYPERACTIVITY   ??? ascorbic acid (VITAMIN C) 1,000 mg tablet Take  by mouth.   ??? DOC-Q-LACE 100 mg capsule TAKE ONE CAPSULE BY MOUTH DAILY   ??? multivitamin with iron (DAILY MULTI-VITAMINS/IRON) tablet Take 1 tablet by mouth daily.   ??? calcium-cholecalciferol, D3, (CALCIUM 600 + D) tablet Take 2 Tabs by mouth daily.   ??? cetirizine (ZYRTEC) 10 mg tablet Take 1 Tab by mouth nightly.     No current facility-administered medications for this visit.          Past Medical History - reviewed:  Past Medical History:   Diagnosis Date   ??? Arthritis    ??? CVA (cerebral infarction)    ??? Diabetes (HCC) 01/09/2015    Hemoglobin A1C 6.4 (per records, done at outside facility)   ??? GERD (gastroesophageal reflux disease)    ??? History of recurrent UTIs     on chronic antibiotic prophylaxis, followed by urology   ??? HLD (hyperlipidemia) 01/09/2015    Total Chol 141,  Tri 49, HDL 59, LDL 72   ??? Hypertension    ??? Neuropathy    ??? Osteopetrosis    ??? Spinal stenosis          Immunizations - reviewed:   Immunization History   Administered Date(s) Administered   ??? TB Skin Test (PPD) Intradermal 08/11/2013         ROS  Review of Systems : A complete review of systems was performed and is negative except for those mentioned in the HPI.    Physical Exam  Visit Vitals   ??? BP 154/74 (BP 1 Location: Right arm, BP Patient Position: Sitting)   ??? Pulse 79   ??? Temp 98.3 ??F (36.8 ??C) (Oral)   ??? Resp 18   ??? Ht 4\' 7"  (1.397 m)   ??? Wt 121 lb (54.9 kg)   ??? LMP 10/23/1978   ??? SpO2 96%   ??? BMI 28.12 kg/m2       General appearance - Alert, NAD.   Head: Atraumatic. Normocephalic.No hematoma or ecchymosis.   Eyes: EOMI. Sclera white.    Respiratory - LCTAB. No wheeze/rale/rhonchi  Heart - Normal rate, regular rhythm. No m/r/r  Abdomen - Soft, non tender. Non distended.   Neurological - CN II-XII intact. Sensation intact and is symmetrical. PERRL. Strength 5/5 in upper and lower extremities. No focal deficits. Speech normal.   Musculoskeletal - Normal ROM, Gait normal.    Extremities - No LE edema. Distal pulses intact  Skin - warm, dry, intact. No rash.       Assessment/Plan  1. Controlled type 2 diabetes mellitus without complication, without long-term current use of insulin (HCC): previously well controlled on this regimen. Patient new to me, but will re-check A1c today. Consider cutting back as too tight glucose control can have adverse effects in the elderly.   - JANUVIA 100 mg tablet; TAKE 1 TABLET BY MOUTH EVERY DAY  Dispense: 30 Tab; Refill: 0  - HEMOGLOBIN A1C WITH EAG  - METABOLIC PANEL, BASIC  - MICROALBUMIN, UR, RAND W/ MICROALBUMIN/CREA RATIO  - Needs to have recent eye exam information sent here.     2. History of CVA (cerebrovascular accident): no residual effects per patient.   - clopidogrel (PLAVIX) 75 mg tab; TAKE 1 TABLET BY MOUTH DAILY  Dispense: 90 Tab; Refill: 0    3. Vulvar atrophy: stable  - conjugated estrogens (PREMARIN) 0.625 mg/gram vaginal cream; Insert 0.5 g into vagina daily.  Dispense: 30 g; Refill: 1    4. History of fall: no sequale. Neuro exam intact. Advised caregiver and patient on warning signs and symptoms to be aware of. Counseled on precautions. Should always use walker. No rugs in hallways. Good lighting at all times.       Return for complete wellness exam    I have discussed the aforementioned diagnoses and plan with the patient in detail. I have provided information in person and/or in AVS. All questions answered prior to discharge.    Benjamine Mola, MD  Family Medicine Resident  PGY 3

## 2016-12-01 NOTE — Progress Notes (Signed)
I reviewed with the resident the medical history and the resident's findings on the physical examination.  I discussed with the resident the patient's diagnosis and concur with the plan.

## 2016-12-02 LAB — MICROALBUMIN, UR, RAND W/ MICROALB/CREAT RATIO
Creatinine, urine random: 107.3 mg/dL
Microalb/Creat ratio (ug/mg creat.): 6.6 mg/g creat (ref 0.0–30.0)
Microalbumin, urine: 7.1 ug/mL

## 2016-12-02 LAB — METABOLIC PANEL, BASIC
BUN/Creatinine ratio: 19 (ref 12–28)
BUN: 13 mg/dL (ref 8–27)
CO2: 25 mmol/L (ref 18–29)
Calcium: 10.3 mg/dL (ref 8.7–10.3)
Chloride: 99 mmol/L (ref 96–106)
Creatinine: 0.68 mg/dL (ref 0.57–1.00)
GFR est AA: 90 mL/min/{1.73_m2} (ref >59)
GFR est non-AA: 78 mL/min/{1.73_m2} (ref >59)
Glucose: 96 mg/dL (ref 65–99)
Potassium: 4.7 mmol/L (ref 3.5–5.2)
Sodium: 137 mmol/L (ref 134–144)

## 2016-12-02 LAB — HEMOGLOBIN A1C WITH EAG
Estimated average glucose: 131 mg/dL
Hemoglobin A1c: 6.2 % — ABNORMAL HIGH (ref 4.8–5.6)

## 2016-12-02 NOTE — Telephone Encounter (Signed)
Prior auth submitted for Januvia 100 mg tabs and approved. Patient's daughter and pharmacy [facility]) advised and expressed understanding.

## 2016-12-05 ENCOUNTER — Encounter

## 2016-12-05 NOTE — Telephone Encounter (Signed)
Patient needs a refill of the following  Requested Prescriptions     Pending Prescriptions Disp Refills   ??? pregabalin (LYRICA) 100 mg capsule 90 Cap 3     Sig: Take 1 Cap by mouth daily. Max Daily Amount: 100 mg. Indications: Diabetic Peripheral Neuropathy, NEUROPATHIC PAIN ASSOCIATED WITH SPINAL CORD INJURY     Refused Prescriptions Disp Refills   ??? LYRICA 100 mg capsule [Pharmacy Med Name: LYRICA 100MG  CAPSULES] 90 Cap 0     Sig: TAKE 1 CAPSULE BY MOUTH ONCE DAILY     Refused By: Benjamine MolaHARRIS, TRENEE J     Reason for Refusal: other

## 2016-12-06 NOTE — Telephone Encounter (Signed)
Per Dr. Tiburcio PeaHarris refill Lyrica , pt notified rx called to pharmacy

## 2016-12-06 NOTE — Telephone Encounter (Signed)
Patient's daughter called again regarding this Rx and is very upset that this has not been addressed. When I spoken with her last night she was upset that it was denied in the first place and wanted to speak with management. I offered her Laurie's VM and she declined stating she would call back the next day and speak with Eunice BlaseDebbie as that is who usually handles her issues. Upon receiving her call tonight she states that the person that she talked to last night told her she was going to rectify the issue. I told her I was the one that she spoke to and that I forwarded the message to the correct person per out conversation last night but that I never mentioned anything about rectifying the situation with the physician. She states I was supposed to speak with management to get this straightened out. I reminded her that I offered to transfer her to Laurie's VM so that she could address these concerns but that she declined and said she would call back and speak with [name]. Daughter was very upset that this was not addressed with management. I informed her I was not in the office during the day which is why I offered last night to transfer her to management. Daughter still did not want me to transfer her. She wanted another doctor to address this tonight and wanted to talk to someone else other than myself. I spoke with Marcelino DusterMichelle who was going to ask Dr. Su Hiltoberts however daughter hung up before Marcelino DusterMichelle could speak to her

## 2016-12-07 MED ORDER — PREGABALIN 100 MG CAP
100 mg | ORAL_CAPSULE | Freq: Every day | ORAL | 0 refills | Status: DC
Start: 2016-12-07 — End: 2017-02-26

## 2016-12-15 NOTE — Telephone Encounter (Signed)
9:50 AM    Called patient.  No answer.  HbA1c is 6.2%. Patient is 5488. Given her age, can consider d/c'ing metformin and evaluating how she does off this medication with repeat A1c in 3-6 months.    Please advise patient when she returns call    Benjamine Molarenee J Harris, MD  12/15/16

## 2016-12-15 NOTE — Progress Notes (Signed)
Called patient.  No answer.  HbA1c is 6.2%. Patient is 3488. Given her age, can consider d/c'ing metformin and evaluating how she does off this medication with repeat A1c in 3-6 months.

## 2016-12-23 ENCOUNTER — Encounter

## 2016-12-27 NOTE — Telephone Encounter (Signed)
Patient daughter would like a explanation for denial of med refill.    Michiel SitesMarilyn Waddell all access [phone]      Thanks

## 2016-12-27 NOTE — Telephone Encounter (Signed)
This Therapist, occupationalwriter contact [facility] [address] in reference to prior authorization requirement for Januvia 100mg  tabs. This Clinical research associatewriter spoke with Shanda BumpsJessica, Pharmacist. Test claim submitted with approval of a copay of $8.35 with no prior authorization required. This Clinical research associatewriter attempted contact patient to provide information above. This writer was unable to reach patient at this time. A voicemail was left for patient to contact the office.

## 2016-12-29 MED ORDER — METFORMIN 500 MG TAB
500 mg | ORAL_TABLET | Freq: Every day | ORAL | 0 refills | Status: DC
Start: 2016-12-29 — End: 2017-02-10

## 2016-12-29 NOTE — Telephone Encounter (Signed)
Patient daughter calling back Chelsea Schultz( Chelsea Schultz) on hippa for status of med refill.    Nurse supervisor Ottis StainAnnie K. Took call

## 2016-12-29 NOTE — Telephone Encounter (Signed)
Spoke with Dr Tiburcio PeaHarris about pt. She spoke with the daughter and explained everything. Medication was changed while on the phone and sent in another encounter.

## 2016-12-29 NOTE — Telephone Encounter (Signed)
Spoke with Dr.Harris in reference to patient. Dr.Harris stated she would speak to the daughter.Explained the need of taking her off the medication. Medication was changed while on the phone and will send to Dr Tiburcio PeaHarris for signature.

## 2017-01-08 MED ORDER — POLYETHYLENE GLYCOL 3350 100 % ORAL POWDER
17 gram/dose | ORAL | 0 refills | Status: DC
Start: 2017-01-08 — End: 2017-08-13

## 2017-01-10 NOTE — Telephone Encounter (Signed)
-----   Message from Edmund HildaFranka Logan sent at 01/10/2017  1:45 PM EDT -----  Regarding: Dr. Cheyenne AdasHarris/Telephone  Cathy William Sledge, daughter, is requesting a referral to an endochronologist. Pt's blood sugar continues to be unstable.    Best contact # [phone] ext [phone]

## 2017-01-10 NOTE — Telephone Encounter (Signed)
There is no order in the patients chart for endocrinology. Can you put in an order

## 2017-01-11 ENCOUNTER — Encounter

## 2017-01-29 ENCOUNTER — Ambulatory Visit: Admit: 2017-01-29 | Discharge: 2017-01-29 | Payer: MEDICARE | Primary: Family Medicine

## 2017-01-29 ENCOUNTER — Encounter

## 2017-01-29 ENCOUNTER — Inpatient Hospital Stay: Admit: 2017-05-14 | Payer: MEDICARE | Primary: Family Medicine

## 2017-01-29 DIAGNOSIS — N3090 Cystitis, unspecified without hematuria: Secondary | ICD-10-CM

## 2017-01-29 LAB — AMB POC URINALYSIS DIP STICK AUTO W/O MICRO
Bilirubin (UA POC): NEGATIVE
Blood (UA POC): NEGATIVE
Glucose (UA POC): NEGATIVE
Ketones (UA POC): NEGATIVE
Leukocyte esterase (UA POC): NEGATIVE
Nitrites (UA POC): NEGATIVE
Protein (UA POC): NEGATIVE
Specific gravity (UA POC): 1.02 (ref 1.001–1.035)
Urobilinogen (UA POC): 0.2 (ref 0.2–1)
pH (UA POC): 5.5 (ref 4.6–8.0)

## 2017-01-29 MED ORDER — CEFDINIR 300 MG CAP
300 mg | ORAL_CAPSULE | Freq: Every day | ORAL | 0 refills | Status: AC
Start: 2017-01-29 — End: 2017-02-03

## 2017-01-29 MED ORDER — CANDESARTAN 32 MG TAB
32 mg | ORAL_TABLET | ORAL | 3 refills | Status: DC
Start: 2017-01-29 — End: 2017-08-13

## 2017-01-29 NOTE — Progress Notes (Signed)
Chelsea Schultz is a 83 y.o. female      Issues discussed today include:        Signs and symptoms:  She feels like another UTI  Duration:  One week  Context:  She's had UTI before  Location:  [place]  Quality:  dysuria  Severity:  No fevers  Timing:  now  Modifying factors:  Rx omnicef    Data reviewed or ordered today:  Urine culture, UA looks OK    Other problems include:  Patient Active Problem List   Diagnosis Code   ??? HTN (hypertension) I10   ??? 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 Urgency incontinence N39.41   ??? DM type 2 (diabetes mellitus, type 2) (HCC) E11.9   ??? Acute CVA (cerebrovascular accident) (HCC) I63.9   ??? Recurrent UTI N39.0       Medications:  Current Outpatient Prescriptions   Medication Sig Dispense Refill   ??? cefdinir (OMNICEF) 300 mg capsule Take 1 Cap by mouth daily for 5 days. 5 Cap 0   ??? candesartan (ATACAND) 32 mg tablet TAKE 1 TABLET BY MOUTH DAILY 90 Tab 3   ??? polyethylene glycol (MIRALAX) 17 gram/dose powder MIX 17 GRAMS IN 8 OUNCES OF WATER AND DRINK ONCE DAILY AS DIRECTED 527 g 0   ??? metFORMIN (GLUCOPHAGE) 500 mg tablet Take 1 Tab by mouth daily (with breakfast). 90 Tab 0   ??? pregabalin (LYRICA) 100 mg capsule Take 1 Cap by mouth daily. Max Daily Amount: 100 mg. Indications: Diabetic Peripheral Neuropathy, NEUROPATHIC PAIN ASSOCIATED WITH SPINAL CORD INJURY 90 Cap 0   ??? JANUVIA 100 mg tablet TAKE 1 TABLET BY MOUTH EVERY DAY 30 Tab 0   ??? clopidogrel (PLAVIX) 75 mg tab TAKE 1 TABLET BY MOUTH DAILY 90 Tab 0   ??? nizatidine (AXID) 150 mg cap capsule Take 1 Cap by mouth nightly. 30 Cap 0   ??? conjugated estrogens (PREMARIN) 0.625 mg/gram vaginal cream Insert 0.5 g into vagina daily. 30 g 1   ??? amLODIPine (NORVASC) 10 mg tablet Take 1 Tab by mouth daily. Indications: hypertension 90 Tab 3   ??? cetirizine (ZYRTEC) 10 mg tablet Take 1 Tab by mouth nightly. 30 Tab 0   ??? atorvastatin (LIPITOR) 10 mg tablet Take 1 Tab by mouth daily. 90 Tab 3    ??? aspirin delayed-release 81 mg tablet Take  by mouth daily.     ??? furosemide (LASIX) 20 mg tablet Take 1 Tab by mouth as needed. Indications: EDEMA 30 Tab 3   ??? trospium (SANCTURA) 20 mg tablet Take 20 mg by mouth Before breakfast, lunch, and dinner. Indications: BLADDER HYPERACTIVITY  0   ??? ascorbic acid (VITAMIN C) 1,000 mg tablet Take  by mouth.     ??? DOC-Q-LACE 100 mg capsule TAKE ONE CAPSULE BY MOUTH DAILY 30 capsule prn   ??? multivitamin with iron (DAILY MULTI-VITAMINS/IRON) tablet Take 1 tablet by mouth daily.     ??? calcium-cholecalciferol, D3, (CALCIUM 600 + D) tablet Take 2 Tabs by mouth daily. 60 Tab 12       Allergies:  No Known Allergies    LMP:  Patient's last menstrual period was 10/23/1978.    Social History     Social History   ??? Marital status: WIDOWED     Spouse name: N/A   ??? Number of children: N/A   ??? Years of education: N/A     Occupational History   ??? Not on file.  Social History Main Topics   ??? Smoking status: Never Smoker   ??? Smokeless tobacco: Never Used   ??? Alcohol use No   ??? Drug use: No   ??? Sexual activity: No     Other Topics Concern   ??? Not on file     Social History Narrative         Family History   Problem Relation Age of Onset   ??? Diabetes Mother    ??? Stroke Mother    ??? Hypertension Mother    ??? Hypertension Father    ??? Stroke Father    ??? Cancer Sister    ??? Stroke Sister          Meaningful use:  done      ROS:  Headaches:  no  Chest Pain:  no  SOB:  no  Fevers:  no  Falls:  no  anxiety/depression/losing interest in doing things that were previously enjoyed:  no.  PHQ2 = 0  Other significant ROS:      Patient's last menstrual period was 10/23/1978.    Physical Exam  Visit Vitals   ??? BP 152/73 (BP 1 Location: Left arm, BP Patient Position: Sitting)   ??? Pulse 82   ??? Temp 98.2 ??F (36.8 ??C) (Oral)   ??? Resp 18   ??? Ht  (1.397 m)   ??? Wt 123 lb (55.8 kg)   ??? LMP 10/23/1978   ??? SpO2 100%   ??? BMI 28.59 kg/m2     BP Readings from Last 3 Encounters:   01/29/17 152/73   12/01/16 154/74    07/21/16 174/90     Constitutional:  Appears well,  No Acute Distress, Vitals noted  Psychiatric:   Affect normal, Alert and cooperative, Oriented to person/place/time    Eyes:   Pupils equally round and reactive, EOMI, conjunctiva clear, eyelids normal  ENT:   External ears and nose normal/lips, teeth=OK/gums normal, TMs and Orophyarynx normal  Neck:   general inspection and Thyroid normal.  No abnormal cervical or supraclavicular nodes    Lungs:   clear to auscultation, good respiratory effort  Heart:   Ausculation normal.  Regular rhythm.  No cardiac murmurs.  No carotid bruits or palpable thrills  Chest wall normal  Abd:  benign  Extremities:  trace edema, good peripheral pulses  Skin:   Warm to palpation, without rashes, bruising, or suspicious lesions     Diabetic foot exam:      Sensation by vibration sense:  good  Monofilament test:  good  Skin integrity:  intact without lesions  Vascular:  good circulation  Motor:  moves all toes, strength seems ok  No onychomycosis        Assessment:    Patient Active Problem List   Diagnosis Code   ??? HTN (hypertension) I10   ??? 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 Urgency incontinence N39.41   ??? DM type 2 (diabetes mellitus, type 2) (HCC) E11.9   ??? Acute CVA (cerebrovascular accident) (HCC) I63.9   ??? Recurrent UTI N39.0       Today's diagnoses are:    ICD-10-CM ICD-9-CM    1. Bladder infection N30.90 595.9 AMB POC URINALYSIS DIP STICK AUTO W/O MICRO      CULTURE, URINE    Rx omnicef   2. Type 2 diabetes mellitus without complication, without long-term current use of insulin (HCC) E11.9 250.00 HM DIABETES FOOT EXAM      HM DIABETES EYE  EXAM      REFERRAL TO OPHTHALMOLOGY   3. Glaucoma screening Z13.5 V80.1 HM GLAUCOMA SCREENING      REFERRAL TO OPHTHALMOLOGY   4. Acute CVA (cerebrovascular accident) (HCC) I63.9 434.91    5. Encounter for immunization Z23 V03.89     Rx for PCV 13 given   6. Essential hypertension I10 401.9 candesartan (ATACAND) 32 mg tablet    refill done    7. Essential hypertension I10 401.9 candesartan (ATACAND) 32 mg tablet       Plan:  Orders Placed This Encounter   ??? CULTURE, URINE   ??? REFERRAL TO OPHTHALMOLOGY     Referral Priority:   Routine     Referral Type:   Consultation     Referral Reason:   Specialty Services Required     Requested Specialty:   Ophthalmology   ??? AMB POC URINALYSIS DIP STICK AUTO W/O MICRO   ??? HM GLAUCOMA SCREENING   ??? HM DIABETES FOOT EXAM   ??? HM DIABETES EYE EXAM   ??? cefdinir (OMNICEF) 300 mg capsule     Sig: Take 1 Cap by mouth daily for 5 days.     Dispense:  5 Cap     Refill:  0   ??? candesartan (ATACAND) 32 mg tablet     Sig: TAKE 1 TABLET BY MOUTH DAILY     Dispense:  90 Tab     Refill:  3       See patient instructions  Patient Instructions   See eye doctor yearly:  I suggest Dr. Antonieta Loveless [phone]    Follow up with your specialists:  For diabetes Dr. Vear Clock [phone]    Drink plenty of fluids    Consider PCV 13 vaccine (Rx given)        refresh note:  done    AVS Printed:  done    Diagnoses and all orders for this visit:    1. Bladder infection  Comments:  Rx omnicef  Orders:  -     AMB POC URINALYSIS DIP STICK AUTO W/O MICRO  -     CULTURE, URINE    2. Type 2 diabetes mellitus without complication, without long-term current use of insulin (HCC)  Assessment & Plan:  Well Controlled, based on history, physical exam and review of pertinent labs, studies and medications; meds reconciled; continue current treatment plan, medication compliance emphasized.  Key Antihyperglycemic Medications             metFORMIN (GLUCOPHAGE) 500 mg tablet  (Taking) Take 1 Tab by mouth daily (with breakfast).    JANUVIA 100 mg tablet  (Taking) TAKE 1 TABLET BY MOUTH EVERY DAY        Other Key Diabetic Medications             pregabalin (LYRICA) 100 mg capsule  (Taking) Take 1 Cap by mouth daily. Max Daily Amount: 100 mg. 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        Lab Results   Component Value Date/Time    Hemoglobin A1c 6.2 12/01/2016 04:10 PM    Glucose 96 12/01/2016 04:10 PM    Creatinine 0.68 12/01/2016 04:10 PM    Microalb/Creat ratio (ug/mg creat.) 6.6 12/01/2016 04:10 PM    Cholesterol, total 124 07/21/2016  11:17 AM    HDL Cholesterol 59 07/21/2016 11:17 AM    LDL, calculated 53 07/21/2016 11:17 AM    Triglyceride 58 07/21/2016 11:17 AM     Diabetic Foot and Eye Exam HM Status   Topic Date Due   ??? Diabetic Foot Care  04/18/1936   ??? Eye Exam  04/18/1936       Orders:  -     HM DIABETES FOOT EXAM  -     HM DIABETES EYE EXAM  -     REFERRAL TO OPHTHALMOLOGY    3. Glaucoma screening  -     HM GLAUCOMA SCREENING  -     REFERRAL TO OPHTHALMOLOGY    4. Acute CVA (cerebrovascular accident) [facility])  Assessment & Plan:  Stable, based on history, physical exam and review of pertinent labs, studies and medications; meds reconciled; continue current treatment plan, medication compliance emphasized.   Key Neurological Meds             pregabalin (LYRICA) 100 mg capsule  (Taking) Take 1 Cap by mouth daily. Max Daily Amount: 100 mg. 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        Lab Results   Component Value Date/Time    WBC 10.6 07/21/2016 11:17 AM    HGB 12.6 07/21/2016 11:17 AM    HCT 39.9 07/21/2016 11:17 AM    PLATELET 268 07/21/2016 11:17 AM    Creatinine 0.68 12/01/2016 04:10 PM    BUN 13 12/01/2016 04:10 PM         5. Encounter for immunization  Comments:  Rx for PCV 13 given    6. Essential hypertension  Comments:  refill done  Orders:  -     candesartan (ATACAND) 32 mg tablet; TAKE 1 TABLET BY MOUTH DAILY     7. Essential hypertension  -     candesartan (ATACAND) 32 mg tablet; TAKE 1 TABLET BY MOUTH DAILY    Other orders  -     cefdinir (OMNICEF) 300 mg capsule; Take 1 Cap by mouth daily for 5 days.

## 2017-01-29 NOTE — Telephone Encounter (Signed)
Patient needs a refill of the following  Requested Prescriptions     Pending Prescriptions Disp Refills   ??? candesartan (ATACAND) 32 mg tablet 90 Tab 0

## 2017-01-29 NOTE — Assessment & Plan Note (Signed)
Well Controlled, based on history, physical exam and review of pertinent labs, studies and medications; meds reconciled; continue current treatment plan, medication compliance emphasized.  Key Antihyperglycemic Medications             metFORMIN (GLUCOPHAGE) 500 mg tablet  (Taking) Take 1 Tab by mouth daily (with breakfast).    JANUVIA 100 mg tablet  (Taking) TAKE 1 TABLET BY MOUTH EVERY DAY        Other Key Diabetic Medications             pregabalin (LYRICA) 100 mg capsule  (Taking) Take 1 Cap by mouth daily. Max Daily Amount: 100 mg. Indications: Diabetic Peripheral Neuropathy, NEUROPATHIC PAIN ASSOCIATED WITH SPINAL CORD INJURY    candesartan (ATACAND) 32 mg tablet  (Taking) TAKE 1 TABLET BY MOUTH DAILY    atorvastatin (LIPITOR) 10 mg tablet  (Taking) Take 1 Tab by mouth daily.        Lab Results   Component Value Date/Time    Hemoglobin A1c 6.2 12/01/2016 04:10 PM    Glucose 96 12/01/2016 04:10 PM    Creatinine 0.68 12/01/2016 04:10 PM    Microalb/Creat ratio (ug/mg creat.) 6.6 12/01/2016 04:10 PM    Cholesterol, total 124 07/21/2016 11:17 AM    HDL Cholesterol 59 07/21/2016 11:17 AM    LDL, calculated 53 07/21/2016 11:17 AM    Triglyceride 58 07/21/2016 11:17 AM     Diabetic Foot and Eye Exam HM Status   Topic Date Due   ??? Diabetic Foot Care  04/18/1936   ??? Eye Exam  04/18/1936

## 2017-01-29 NOTE — Assessment & Plan Note (Signed)
Stable, based on history, physical exam and review of pertinent labs, studies and medications; meds reconciled; continue current treatment plan, medication compliance emphasized.   Key Neurological Meds             pregabalin (LYRICA) 100 mg capsule  (Taking) Take 1 Cap by mouth daily. Max Daily Amount: 100 mg. 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        Lab Results   Component Value Date/Time    WBC 10.6 07/21/2016 11:17 AM    HGB 12.6 07/21/2016 11:17 AM    HCT 39.9 07/21/2016 11:17 AM    PLATELET 268 07/21/2016 11:17 AM    Creatinine 0.68 12/01/2016 04:10 PM    BUN 13 12/01/2016 04:10 PM

## 2017-01-29 NOTE — Patient Instructions (Addendum)
See eye doctor yearly:  I suggest Dr. Antonieta Loveless [phone]    Follow up with your specialists:  For diabetes Dr. Vear Clock [phone]    Drink plenty of fluids    Consider PCV 13 vaccine (Rx given)

## 2017-01-30 NOTE — Telephone Encounter (Signed)
Medication already refilled 01/29/17 by Dr. Jean Rosenthal.

## 2017-02-05 ENCOUNTER — Encounter

## 2017-02-05 LAB — CULTURE, URINE

## 2017-02-05 NOTE — Progress Notes (Signed)
You did have a UTI.  Let's recheck a urine culture after you finish the antibiotics you were given

## 2017-02-10 ENCOUNTER — Encounter

## 2017-02-12 MED ORDER — METFORMIN 500 MG TAB
500 mg | ORAL_TABLET | ORAL | 0 refills | Status: DC
Start: 2017-02-12 — End: 2017-02-15

## 2017-02-14 MED ORDER — POLYETHYLENE GLYCOL 3350 100 % ORAL POWDER
17 gram/dose | ORAL | 0 refills | Status: DC
Start: 2017-02-14 — End: 2017-02-26

## 2017-02-15 ENCOUNTER — Telehealth

## 2017-02-15 ENCOUNTER — Encounter

## 2017-02-15 MED ORDER — METFORMIN 500 MG TAB
500 mg | ORAL_TABLET | Freq: Two times a day (BID) | ORAL | 0 refills | Status: DC
Start: 2017-02-15 — End: 2017-02-26

## 2017-02-15 NOTE — Telephone Encounter (Signed)
Spoke with daughter Jola Babinski who stated that patient is completely out Metformin due to taking 1 tablet twice daily. Daughter stated patient prescription stated sent to pharmacy was written to  take 1 tablet once daily instead of 1 tablet twice a daily. Daughter stated patient started taking twice daily due to once a day was not working.  Notified daughter verbally per Dr. Tiburcio Pea Metformin 500 mg prescription will be sent to pharmacy for a 2 week supply. Notified daughter verbally per Dr. Tiburcio Pea patient needs to follow up with Dr. Jean Rosenthal to be reevaluated. Daughter verbalized understanding.Daughter Jodell Cipro requested for prescription to be sent to [facility] on [address] in  [place] . Patient scheduled on evening clinic with Dr. Jean Rosenthal on 02/26/17 at 5:30pm.     Verbal order per Dr. Tiburcio Pea given to send in prescription for Metformin 500 mg.

## 2017-02-19 NOTE — Telephone Encounter (Signed)
Already completed

## 2017-02-26 ENCOUNTER — Ambulatory Visit: Admit: 2017-02-26 | Discharge: 2017-02-26 | Payer: MEDICARE | Primary: Family Medicine

## 2017-02-26 ENCOUNTER — Encounter

## 2017-02-26 DIAGNOSIS — E119 Type 2 diabetes mellitus without complications: Secondary | ICD-10-CM

## 2017-02-26 MED ORDER — NIZATIDINE 150 MG CAP
150 mg | ORAL_CAPSULE | Freq: Every evening | ORAL | 3 refills | Status: DC | PRN
Start: 2017-02-26 — End: 2017-02-26

## 2017-02-26 MED ORDER — METFORMIN 500 MG TAB
500 mg | ORAL_TABLET | Freq: Every day | ORAL | 3 refills | Status: DC
Start: 2017-02-26 — End: 2017-02-27

## 2017-02-26 MED ORDER — PREGABALIN 100 MG CAP
100 mg | ORAL_CAPSULE | Freq: Every day | ORAL | 0 refills | Status: AC
Start: 2017-02-26 — End: 2017-05-27

## 2017-02-26 MED ORDER — NIZATIDINE 150 MG CAP
150 mg | ORAL_CAPSULE | ORAL | 3 refills | Status: DC
Start: 2017-02-26 — End: 2017-04-18

## 2017-02-26 MED ORDER — CLOPIDOGREL 75 MG TAB
75 mg | ORAL_TABLET | ORAL | 0 refills | Status: DC
Start: 2017-02-26 — End: 2017-05-29

## 2017-02-26 MED ORDER — METFORMIN 500 MG TAB
500 mg | ORAL_TABLET | Freq: Every day | ORAL | 11 refills | Status: DC
Start: 2017-02-26 — End: 2017-02-26

## 2017-02-26 NOTE — Progress Notes (Signed)
Greater than 50% of today's 15 minute visit was counseling or coordination of care for the following reasons:    See diagnoses and orders, see patient instructions    11/2016:  a1c 6.2/  LDL 53/  MAB negative    She is now off Januvia and taking metformin once daily.  We discussed ppossibility of coming off metformin Rx all together but she wants to continue once daily.  Sugars running 100 range.  No  Low spells.    She needed refills of Axid and Lyrica    Repeat urine culture today

## 2017-02-26 NOTE — Patient Instructions (Signed)
Continue metformin once daily    Labs again in June

## 2017-02-27 MED ORDER — METFORMIN 500 MG TAB
500 mg | ORAL_TABLET | ORAL | 11 refills | Status: DC
Start: 2017-02-27 — End: 2017-08-21

## 2017-03-10 ENCOUNTER — Inpatient Hospital Stay: Admit: 2017-06-20 | Payer: MEDICARE | Primary: Family Medicine

## 2017-03-10 DIAGNOSIS — N390 Urinary tract infection, site not specified: Secondary | ICD-10-CM

## 2017-03-13 ENCOUNTER — Encounter

## 2017-03-13 LAB — CULTURE, URINE

## 2017-03-13 MED ORDER — ATORVASTATIN 10 MG TAB
10 mg | ORAL_TABLET | ORAL | 0 refills | Status: DC
Start: 2017-03-13 — End: 2017-06-10

## 2017-03-13 MED ORDER — LEVOFLOXACIN 250 MG TAB
250 mg | ORAL_TABLET | Freq: Every day | ORAL | 0 refills | Status: AC
Start: 2017-03-13 — End: 2017-03-20

## 2017-03-13 NOTE — Telephone Encounter (Signed)
Spoke with Ms. Mayford KnifeWilliams daughter [name], on access list, informed of lab results and medication that has been sent to local pharmacy.  Ms. Mayford KnifeWilliams needs to return in one week to have another urine culture done.  [name] verbalized understanding.

## 2017-03-13 NOTE — Progress Notes (Signed)
Your urine still looks infected.  I sent Rx for levaquin to your local pharmacy.  Take one pill daily for 7 days then recheck a urine culture.

## 2017-04-11 ENCOUNTER — Encounter

## 2017-04-23 MED ORDER — NIZATIDINE 150 MG CAP
150 mg | ORAL_CAPSULE | Freq: Every evening | ORAL | 1 refills | Status: DC
Start: 2017-04-23 — End: 2018-05-28

## 2017-05-18 MED ORDER — POLYETHYLENE GLYCOL 3350 100 % ORAL POWDER
17 gram/dose | ORAL | 0 refills | Status: DC
Start: 2017-05-18 — End: 2017-09-05

## 2017-05-29 ENCOUNTER — Encounter

## 2017-05-29 MED ORDER — CLOPIDOGREL 75 MG TAB
75 mg | ORAL_TABLET | ORAL | 0 refills | Status: DC
Start: 2017-05-29 — End: 2017-08-21

## 2017-05-29 NOTE — Telephone Encounter (Signed)
Patient needs a refill of the following  Requested Prescriptions     Pending Prescriptions Disp Refills   ??? clopidogrel (PLAVIX) 75 mg tab 90 Tab 0

## 2017-06-05 NOTE — Telephone Encounter (Signed)
Patients daughter called stating the LYRICA 100 mg capsule [Pharmacy Med Name: LYRICA 100MG  CAPSULES]   Has been denied by the patients insurance and the patient took her last pill today. She stated she needs Dr. Jean RosenthalJackson to submit the paperwork to the insurance for prior authorization ASAP, to try to get the insurance to approve the refills.

## 2017-06-07 ENCOUNTER — Encounter

## 2017-06-10 ENCOUNTER — Encounter

## 2017-06-10 MED ORDER — LYRICA 100 MG CAPSULE
100 mg | ORAL_CAPSULE | ORAL | 0 refills | Status: DC
Start: 2017-06-10 — End: 2017-07-19

## 2017-06-11 MED ORDER — ATORVASTATIN 10 MG TAB
10 mg | ORAL_TABLET | ORAL | 0 refills | Status: DC
Start: 2017-06-11 — End: 2017-08-21

## 2017-06-11 NOTE — Telephone Encounter (Signed)
Returned call, left message to call office.     Per [employer], Lyrica is ready for pick up and it does not need a prior authorization.

## 2017-07-19 ENCOUNTER — Encounter

## 2017-07-21 MED ORDER — LYRICA 100 MG CAPSULE
100 mg | ORAL_CAPSULE | ORAL | 3 refills | Status: DC
Start: 2017-07-21 — End: 2017-08-21

## 2017-07-23 NOTE — Telephone Encounter (Signed)
Pharmacy called about medication listed below. Verified with nurse Lynnette Caffeyhristina S. That RX is here at office for patient to pick up. Medication can't be called into pharmacy as controlled per nurse.        Medication Detail      Disp Refills Start End      LYRICA 100 mg capsule 30 Cap 3 07/21/2017     Sig: TAKE 1 CAPSULE BY MOUTH EVERY DAY    Class: Print

## 2017-08-13 ENCOUNTER — Encounter: Admit: 2017-08-13 | Primary: Family Medicine

## 2017-08-13 ENCOUNTER — Ambulatory Visit: Admit: 2017-08-13 | Discharge: 2017-08-13 | Payer: MEDICARE | Primary: Family Medicine

## 2017-08-13 ENCOUNTER — Encounter

## 2017-08-13 ENCOUNTER — Inpatient Hospital Stay: Admit: 2017-10-31 | Payer: MEDICARE | Primary: Family Medicine

## 2017-08-13 DIAGNOSIS — J849 Interstitial pulmonary disease, unspecified: Secondary | ICD-10-CM

## 2017-08-13 DIAGNOSIS — N390 Urinary tract infection, site not specified: Secondary | ICD-10-CM

## 2017-08-13 DIAGNOSIS — I10 Essential (primary) hypertension: Secondary | ICD-10-CM

## 2017-08-13 LAB — AMB POC URINE, MICROALBUMIN, SEMIQUANT (3 RESULTS)
ALBUMIN, URINE POC: 30 mg/L
CREATININE, URINE POC: 200 mg/dL
Microalbumin/creat ratio (POC): <30 MG/G (ref <30)

## 2017-08-13 MED ORDER — LOSARTAN 25 MG TAB
25 mg | ORAL_TABLET | Freq: Every day | ORAL | 3 refills | Status: DC
Start: 2017-08-13 — End: 2017-08-13

## 2017-08-13 MED ORDER — LOSARTAN 25 MG TAB
25 mg | ORAL_TABLET | ORAL | 3 refills | Status: DC
Start: 2017-08-13 — End: 2017-09-26

## 2017-08-13 NOTE — ACP (Advance Care Planning) (Signed)
Discussed with daughter Carley HammedMarilyn Schultz and patient 08/13/2017:  Full code  Daughter Lucius ConnCathy Sledge Haren [phone] would be decision-maker if needed

## 2017-08-13 NOTE — ACP (Advance Care Planning) (Signed)
Discussed with daughter [name] Wadell and patient 08/13/2017:  Full code  Daughter Cathy Sledge Catoe [phone] would be decision-maker if needed

## 2017-08-13 NOTE — Progress Notes (Signed)
Your labs are stable.  Awaiting urine culture.

## 2017-08-13 NOTE — Progress Notes (Signed)
Chief Complaint   Patient presents with   ??? Hospital Follow Up     Hospital 07/31/2017     1. Have you been to the ER, urgent care clinic since your last visit?  Hospitalized since your last visit?Yes When: July 31, 2017    2. Have you seen or consulted any other health care providers outside [facility] since your last visit?  Include any pap smears or colon screening. No    Reviewed record in preparation for visit and have obtained necessary documentation.

## 2017-08-13 NOTE — Progress Notes (Signed)
No pneumonia is seen.  I suspect lung scarring is causing the sound that I hear.

## 2017-08-13 NOTE — Patient Instructions (Signed)
ob[facility] physical [facility] [phone]    Start losartan 25 mg for blood pressure.  Continue amlodipine 10 mg.  Recheck in 2 weeks

## 2017-08-13 NOTE — Progress Notes (Signed)
Chelsea Schultz is a 83 y.o. female      Issues discussed today include:        Signs and symptoms:  Was living in [place] and had left leg weakness, was admitted to [facility] 10/9 - 08/03/2017 for new CVA with left hemiparesis  Duration:  In hospital 4 days  Context:  She has slowly improved but still has left leg weakness  Location:  Left leg>left arm weakness  Quality:  Mostly left leg weakness  Severity:  No dysphagia, +fall risk  Timing:  Getting better  Modifying factors:  Needs home health for PT/OT.  Due for follow up at [facility] 09/28/2017 3:30 Pm Dr. Clelia Croft    Data reviewed or ordered today:  Labs today    ECHO 08/01/2017:  normal LV size and function    CT head 07/31/2017:  Old lacunar infarct left thalamus    MRI brain 07/31/2017:  old right pontine CVA, bilateral thalamus CVA (?new), atrophy    MRA neck:  Severe stenosis right ICA    Other problems include:  Patient Active Problem List   Diagnosis Code   ??? HTN (hypertension) I10   ??? 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 Urgency incontinence N39.41   ??? DM type 2 (diabetes mellitus, type 2) (HCC) E11.9   ??? Acute CVA (cerebrovascular accident) (HCC) I63.9   ??? Recurrent UTI N39.0   ??? Acute ischemic stroke (HCC) I63.9       Medications:  Current Outpatient Medications   Medication Sig Dispense Refill   ??? losartan (COZAAR) 25 mg tablet Take 1 Tab by mouth daily. 30 Tab 3   ??? LYRICA 100 mg capsule TAKE 1 CAPSULE BY MOUTH EVERY DAY 30 Cap 3   ??? atorvastatin (LIPITOR) 10 mg tablet TAKE 1 TABLET BY MOUTH DAILY 90 Tab 0   ??? clopidogrel (PLAVIX) 75 mg tab TAKE 1 TABLET BY MOUTH DAILY 90 Tab 0   ??? polyethylene glycol (MIRALAX) 17 gram/dose powder MIX 17 GM WITH 8 OUNCES OF FLUID EVERY DAY AS DIRECTED 765 g 0   ??? nizatidine (AXID) 150 mg cap capsule Take 1 Cap by mouth nightly. 90 Cap 1   ??? metFORMIN (GLUCOPHAGE) 500 mg tablet TAKE 1 TABLET BY MOUTH DAILY WITH BREAKFAST 90 Tab 11   ??? amLODIPine (NORVASC) 10 mg tablet Take 1 Tab by mouth daily.  Indications: hypertension 90 Tab 3   ??? aspirin delayed-release 81 mg tablet Take  by mouth daily.     ??? trospium (SANCTURA) 20 mg tablet Take 20 mg by mouth Before breakfast, lunch, and dinner. Indications: BLADDER HYPERACTIVITY  0   ??? ascorbic acid (VITAMIN C) 1,000 mg tablet Take  by mouth.     ??? multivitamin with iron (DAILY MULTI-VITAMINS/IRON) tablet Take 1 tablet by mouth daily.     ??? calcium-cholecalciferol, D3, (CALCIUM 600 + D) tablet Take 2 Tabs by mouth daily. 60 Tab 12       Allergies:  No Known Allergies    LMP:  Patient's last menstrual period was 10/23/1978.    Social History     Socioeconomic History   ??? Marital status: WIDOWED     Spouse name: Not on file   ??? Number of children: Not on file   ??? Years of education: Not on file   ??? Highest education level: Not on file   Social Needs   ??? Financial resource strain: Not on file   ??? Food insecurity - worry: Not  on file   ??? Food insecurity - inability: Not on file   ??? Transportation needs - medical: Not on file   ??? Transportation needs - non-medical: Not on file   Occupational History   ??? Not on file   Tobacco Use   ??? Smoking status: Never Smoker   ??? Smokeless tobacco: Never Used   Substance and Sexual Activity   ??? Alcohol use: No   ??? Drug use: No   ??? Sexual activity: No   Other Topics Concern   ??? Not on file   Social History Narrative   ??? Not on file         Family History   Problem Relation Age of Onset   ??? Diabetes Mother    ??? Stroke Mother    ??? Hypertension Mother    ??? Hypertension Father    ??? Stroke Father    ??? Cancer Sister    ??? Stroke Sister          Meaningful use:  done      ROS:  Headaches:  yes  Chest Pain:  no  SOB:  no  Fevers:  no  Falls:  Not since discharge but she is at risk  anxiety/depression/losing interest in doing things that were previously enjoyed:  no.  PHQ2 = 0  Other significant ROS:  Leg weakness  Patient denied problems with:  Hearing/vision, speaking/swallowing,  Diarrhea, Mood (anxiety/depression/losing interest in doing things that were previously enjoyed), Fatigue, Weight change, memory                                                       Any other Positive ROS include: GERD, cough    Consipation, polyuria, +snoring but no OSA    Falls in the past 12 months:  yes           Over the last year (or since your last visit):  Have you been diagnosed with heart attack, broken bones, or skin cancer = no    Exercise:  Needs PT for fall prevetnion             Smoking history:  none                                Patient's last menstrual period was 10/23/1978.  Physical Exam  Visit Vitals  BP 158/71 (BP 1 Location: Right arm, BP Patient Position: Sitting)   Pulse 79   Temp 97.9 ??F (36.6 ??C) (Oral)   Resp 16   Ht 4\' 7"  (1.397 m)   Wt 123 lb (55.8 kg)   LMP 10/23/1978   SpO2 98%   BMI 28.59 kg/m??     BP Readings from Last 3 Encounters:   08/13/17 158/71   02/26/17 116/66   01/29/17 152/73     Constitutional:  Appears stable,  No Acute Distress, Vitals noted  Psychiatric:   Affect normal, Alert and cooperative, Oriented to person/place/time    Eyes:   Pupils equally round and reactive, EOMI, conjunctiva clear, eyelids normal  ENT:   External ears and nose normal/lips, teeth=OK/gums normal, TMs and Orophyarynx normal  Neck:   general inspection and Thyroid normal.  No abnormal cervical or supraclavicular nodes    Lungs:  Crackles right base  to auscultation, good respiratory effort  Heart:   Ausculation normal.  Regular rhythm.  No cardiac murmurs.  No carotid bruits or palpable thrills  Chest wall normal  Abdominal exam:   normal.  Liver and spleen normal.  No bruits/masses/tenderness    Extremities:   without edema, good peripheral pulses  Skin:   Warm to palpation, without rashes, bruising, or suspicious lesions     Neuro:  No facial droop, speech fluent, EOMI, Pupils equally round and reactive to light, visual fields seem OK, hearing seems normal and  symmetrical,smile symmetrical, puffs out cheeks symmetrically    Shoulder shrug symmetrical     moves all extremities, strength decreased in legs:  left >right/sensation seems intact and symmetrical    balance seems very at risk, no pronator drift, gait not tested due to weakness. "get up and go" test not good    Cannot do squats/heel standing/toe standing.  She wears brace for foot drop left leg    no tenderness of C spine, T spine, LS spine, flexion/extension of spine OK    Affect seems appropriate, no obvious mental processing problems    MSK:  Full passive ROM all joints                Assessment:    Patient Active Problem List   Diagnosis Code   ??? HTN (hypertension) I10   ??? 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 Urgency incontinence N39.41   ??? DM type 2 (diabetes mellitus, type 2) (HCC) E11.9   ??? Acute CVA (cerebrovascular accident) (HCC) I63.9   ??? Recurrent UTI N39.0   ??? Acute ischemic stroke (HCC) I63.9       Today's diagnoses are:  Diagnoses and all orders for this visit:    1. Essential hypertension  Comments:  not optimal.  she is OFF ARB.  will restart ARB with losartan 25 mg once daily.  RTC 2 weeks  Orders:  -     losartan (COZAAR) 25 mg tablet; Take 1 Tab by mouth daily.    2. Cerebrovascular accident (CVA) due to stenosis of right carotid artery (HCC)  -     REFERRAL TO HOME HEALTH    3. At maximum risk for fall  Comments:  needs home health PT/OT.  [facility] [phone] ext [phone] [place]  Orders:  -     REFERRAL TO HOME HEALTH    4. Recurrent UTI  Comments:  she just finished augmentin  Orders:  -     CULTURE, URINE    5. Interstitial lung disorders (HCC)  Comments:  crackles right base  Orders:  -     XR CHEST PA LAT; Future    6. Type 2 diabetes mellitus without complication, without long-term current use of insulin (HCC)  -     HEMOGLOBIN A1C WITH EAG  -     AMB POC URINE, MICROALBUMIN, SEMIQUANT (3 RESULTS)  -     METABOLIC PANEL, BASIC  -     ALT  -     LDL, DIRECT     7. Advance directive discussed with patient          Plan:  Orders Placed This Encounter   ??? losartan (COZAAR) 25 mg tablet     Sig: Take 1 Tab by mouth daily.     Dispense:  30 Tab     Refill:  3     This replaces candesartan and valsartan       See  patient instructions  There are no Patient Instructions on file for this visit.      refresh note:  done    AVS Printed:  done    Diagnoses (need 4)          4 established problems OR new problem with work up?  YES    Data (need 4)         Ordering labs  =  YES         Ordering radiology  =  YES         Ordering medical test (EKG, PT)  =  YES           Decision to obtain old records   =  YES         Reviewing/summarize old records  =  YES  (gets 2 points)         Obtain history from someone other than patient  =  YES  (gets 2 points), daughter    Risk (High)           Acute or chronic illness with threat to life  =  YES      Need 2 of 3 above plus below    Level 5 history  (4HPI, 2 PFS, 10 ROS)  =  YES    Level 5 PE  =  YES    Level 5  ROS  =  YES

## 2017-08-14 LAB — CULTURE, URINE

## 2017-08-14 LAB — METABOLIC PANEL, BASIC
BUN/Creatinine ratio: 15 (ref 12–28)
BUN: 14 mg/dL (ref 8–27)
CO2: 22 mmol/L (ref 20–29)
Calcium: 10.8 mg/dL — ABNORMAL HIGH (ref 8.7–10.3)
Chloride: 101 mmol/L (ref 96–106)
Creatinine: 0.92 mg/dL (ref 0.57–1.00)
GFR est AA: 64 mL/min/{1.73_m2} (ref >59)
GFR est non-AA: 55 mL/min/{1.73_m2} — ABNORMAL LOW (ref >59)
Glucose: 107 mg/dL — ABNORMAL HIGH (ref 65–99)
Potassium: 4.8 mmol/L (ref 3.5–5.2)
Sodium: 140 mmol/L (ref 134–144)

## 2017-08-14 LAB — CKD REPORT

## 2017-08-14 LAB — HEMOGLOBIN A1C WITH EAG
Estimated average glucose: 151 mg/dL
Hemoglobin A1c: 6.9 % — ABNORMAL HIGH (ref 4.8–5.6)

## 2017-08-14 LAB — LDL, DIRECT: LDL,Direct: 87 mg/dL (ref 0–99)

## 2017-08-14 LAB — ALT: ALT (SGPT): 15 IU/L (ref 0–32)

## 2017-08-14 LAB — DIABETES PATIENT EDUCATION

## 2017-08-14 NOTE — Telephone Encounter (Signed)
Printed and faxed.

## 2017-08-14 NOTE — Telephone Encounter (Signed)
Chelsea Schultz , [facility]  [phone]  [phone]    Called to say she needs all of this:    Demographic, insurance information.  Last physical  Current medication list  Referral order.

## 2017-08-20 MED ORDER — AMLODIPINE 10 MG TAB
10 mg | ORAL_TABLET | ORAL | 0 refills | Status: DC
Start: 2017-08-20 — End: 2017-08-21

## 2017-08-21 ENCOUNTER — Ambulatory Visit: Admit: 2017-08-21 | Discharge: 2017-08-21 | Payer: MEDICARE | Primary: Family Medicine

## 2017-08-21 ENCOUNTER — Ambulatory Visit: Primary: Family Medicine

## 2017-08-21 DIAGNOSIS — Z0000 Encounter for general adult medical examination without abnormal findings: Secondary | ICD-10-CM

## 2017-08-21 MED ORDER — PREGABALIN 100 MG CAP
100 mg | ORAL_CAPSULE | ORAL | 3 refills | Status: DC
Start: 2017-08-21 — End: 2017-09-25

## 2017-08-21 MED ORDER — AMLODIPINE 10 MG TAB
10 mg | ORAL_TABLET | ORAL | 3 refills | Status: DC
Start: 2017-08-21 — End: 2018-02-18

## 2017-08-21 MED ORDER — CLOPIDOGREL 75 MG TAB
75 mg | ORAL_TABLET | ORAL | 3 refills | Status: DC
Start: 2017-08-21 — End: 2018-03-24

## 2017-08-21 MED ORDER — ATORVASTATIN 10 MG TAB
10 mg | ORAL_TABLET | ORAL | 3 refills | Status: DC
Start: 2017-08-21 — End: 2018-03-24

## 2017-08-21 MED ORDER — METFORMIN 500 MG TAB
500 mg | ORAL_TABLET | ORAL | 3 refills | Status: DC
Start: 2017-08-21 — End: 2018-01-28

## 2017-08-21 MED ORDER — MIRABEGRON ER 50 MG TABLET,EXTENDED RELEASE 24 HR
50 mg | ORAL_TABLET | Freq: Every day | ORAL | 3 refills | Status: DC
Start: 2017-08-21 — End: 2018-02-23

## 2017-08-21 NOTE — Assessment & Plan Note (Signed)
Stable, based on history, physical exam and review of pertinent labs, studies and medications; meds reconciled; continue current treatment plan.  Key COPD Medications     Patient is on no COPD/Asthma meds.        Lab Results   Component Value Date/Time    WBC 10.6 07/21/2016 11:17 AM    HGB 12.6 07/21/2016 11:17 AM    HCT 39.9 07/21/2016 11:17 AM    PLATELET 268 07/21/2016 11:17 AM

## 2017-08-21 NOTE — Progress Notes (Signed)
Chelsea Schultz is a 83 y.o. female      Issues discussed today include:        Signs and symptoms:  Wears left leg brace, needs diabetic shoes and DMV form s/p CVA  Duration:  She had stroke 07/31/2017  Context:  She is now getting home health PT/OT  Location:  Left leg weakness>left arm  Quality:  Mobility is still an issue  Severity:  She has bunions and peripheral neuropathy complicating DM  Timing:  PT just started  Modifying factors:  Rx:  Diabetic shoes and DMV form    Data reviewed or ordered today:  07/2017:  a1c 6.9/  LDL 87/ MAB negative/ Cr 0.92/ GFR 64  CXR 07/2017:  No pneumonia is seen. ??I suspect lung scarring is causing the sound that I hear.  CT 2017:  1. There are mild peripheral reticular opacities in the lung fields with a   basilar predominance. There is very minimal honeycombing and bronchiolectasis at   the lung bases. This is best evaluated on the prone images. These findings are   consistent with UIP pattern of pulmonary fibrosis. These changes are mild.   2. Mild enlargement of a subcarinal lymph node. As well as to lymph nodes in the   pretracheal region at the upper limits of normal. Etiology is uncertain. These   lymph nodes could be reactive. These could be evaluated on follow-up.     Other problems include:  Patient Active Problem List   Diagnosis Code   ??? HTN (hypertension) I10   ??? Urgency incontinence N39.41   ??? Recurrent UTI N39.0   ??? Acute ischemic stroke (HCC) I63.9   ??? Advance directive discussed with patient Z71.89   ??? At risk for falling Z91.81   ??? Pulmonary fibrosis (HCC) J84.10   ??? 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 mirabegron (MYRBETRIQ PO) Take 50 mg by mouth daily.     ??? amLODIPine (NORVASC) 10 mg tablet TAKE 1 TABLET BY MOUTH DAILY FOR HIGH BLOOD PRESSURE 90 Tab 0   ??? losartan (COZAAR) 25 mg tablet TAKE 1 TABLET BY MOUTH ONCE DAILY 90 Tab 3    ??? LYRICA 100 mg capsule TAKE 1 CAPSULE BY MOUTH EVERY DAY 30 Cap 3   ??? atorvastatin (LIPITOR) 10 mg tablet TAKE 1 TABLET BY MOUTH DAILY 90 Tab 0   ??? clopidogrel (PLAVIX) 75 mg tab TAKE 1 TABLET BY MOUTH DAILY 90 Tab 0   ??? polyethylene glycol (MIRALAX) 17 gram/dose powder MIX 17 GM WITH 8 OUNCES OF FLUID EVERY DAY AS DIRECTED 765 g 0   ??? nizatidine (AXID) 150 mg cap capsule Take 1 Cap by mouth nightly. (Patient taking differently: Take 150 mg by mouth as needed.) 90 Cap 1   ??? metFORMIN (GLUCOPHAGE) 500 mg tablet TAKE 1 TABLET BY MOUTH DAILY WITH BREAKFAST 90 Tab 11   ??? aspirin delayed-release 81 mg tablet Take  by mouth daily.     ??? ascorbic acid (VITAMIN C) 1,000 mg tablet Take  by mouth.     ??? multivitamin with iron (DAILY MULTI-VITAMINS/IRON) tablet Take 1 tablet by mouth daily.     ??? calcium-cholecalciferol, D3, (CALCIUM 600 + D) tablet Take 2 Tabs by mouth daily. 60 Tab 12       Allergies:  No Known Allergies    LMP:  Patient's last menstrual period  was 10/23/1978.    Social History     Socioeconomic History   ??? Marital status: WIDOWED     Spouse name: Not on file   ??? Number of children: Not on file   ??? Years of education: Not on file   ??? Highest education level: Not on file   Social Needs   ??? Financial resource strain: Not on file   ??? Food insecurity - worry: Not on file   ??? Food insecurity - inability: Not on file   ??? Transportation needs - medical: Not on file   ??? Transportation needs - non-medical: Not on file   Occupational History   ??? Not on file   Tobacco Use   ??? Smoking status: Never Smoker   ??? Smokeless tobacco: Never Used   Substance and Sexual Activity   ??? Alcohol use: No   ??? Drug use: No   ??? Sexual activity: No   Other Topics Concern   ??? Not on file   Social History Narrative   ??? Not on file         Family History   Problem Relation Age of Onset   ??? Diabetes Mother    ??? Stroke Mother    ??? Hypertension Mother    ??? Hypertension Father    ??? Stroke Father    ??? Cancer Sister    ??? Stroke Sister           Meaningful use:  done      ROS:  Headaches:  occ  Chest Pain:  no  SOB:  no  Fevers:  no  Falls:  3 months ago.  Now getting [facility] PT/OT  anxiety/depression/losing interest in doing things that were previously enjoyed:  no.  PHQ2 = , PHQ9 = 4  Other significant ROS:  She wears left leg brace for foot drop  Patient denied problems with:  vision, speaking/swallowing, Reflux/indigestion, Cough,Diarrhea Mood (anxiety/depression/losing interest in doing things that were previously enjoyed), Snoring/sleep apnea,Fatigue, Weight change, memory                                                       Any other Positive ROS include: HOH    Bladder leaks   Stroke in 10.2018    Falls in the past 12 months:  yes           Over the last year (or since your last visit):  Have you been diagnosed with heart attack, broken bones, or skin cancer = no    Exercise:  Getting PT/OT via HH             Smoking history:  none                                Patient's last menstrual period was 10/23/1978.  Physical Exam  Visit Vitals  BP 168/80 (BP 1 Location: Left arm, BP Patient Position: Sitting)   Pulse 74   Temp 97.5 ??F (36.4 ??C) (Oral)   Resp 16   Ht 4\' 7"  (1.397 m)   Wt 126 lb (57.2 kg)   LMP 10/23/1978   SpO2 100%   BMI 29.29 kg/m??     BP Readings from Last 3 Encounters:   08/21/17 168/80   08/13/17  158/71   02/26/17 116/66     Constitutional:  Appears well,  No Acute Distress, Vitals noted  Psychiatric:   Affect normal, Alert and cooperative, Oriented to person/place/time    Eyes:   Pupils equally round and reactive, EOMI, conjunctiva clear, eyelids normal  ENT:   External ears and nose normal/lips, teeth=OK/gums normal, TMs and Orophyarynx normal, HOH  Neck:   general inspection and Thyroid normal.  No abnormal cervical or supraclavicular nodes    Lungs:  Dry crackles in bases to auscultation, good respiratory effort  Heart:   Ausculation normal.  Regular rhythm.  No cardiac murmurs.  No carotid bruits or palpable thrills   Chest wall normal  Abdominal exam:   normal.  Liver and spleen normal.  No bruits/masses/tenderness    Extremities:   without edema, good peripheral pulses  Skin:   Warm to palpation, without rashes, bruising, or suspicious lesions     Diabetic foot exam:      Sensation by vibration sense:  poor  Monofilament test:  poor  Skin integrity:  intact without lesions  Vascular:  suspect circulation  Motor:  Left leg weakness, wears left leg brace  +bunions        Assessment:    Patient Active Problem List   Diagnosis Code   ??? HTN (hypertension) I10   ??? Urgency incontinence N39.41   ??? Recurrent UTI N39.0   ??? Acute ischemic stroke (HCC) I63.9   ??? Advance directive discussed with patient Z71.89   ??? At risk for falling Z91.81   ??? Pulmonary fibrosis (HCC) J84.10   ??? 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 Healthcare maintenance    2. Type 2 diabetes with nephropathy [facility](HCC)  Assessment & Plan:  This condition is managed by Specialist.  Key Antihyperglycemic Medications             metFORMIN (GLUCOPHAGE) 500 mg tablet  (Taking) TAKE 1 TABLET BY MOUTH DAILY WITH BREAKFAST        Other Key Diabetic Medications             losartan (COZAAR) 25 mg tablet  (Taking) TAKE 1 TABLET BY MOUTH ONCE DAILY    LYRICA 100 mg capsule  (Taking) TAKE 1 CAPSULE BY MOUTH EVERY DAY    atorvastatin (LIPITOR) 10 mg tablet  (Taking) TAKE 1 TABLET BY MOUTH DAILY        Lab Results   Component Value Date/Time    Hemoglobin A1c 6.9 08/13/2017 03:48 PM    Glucose 107 08/13/2017 03:48 PM    Creatinine 0.92 08/13/2017 03:48 PM    Microalbumin/creat ratio (POC) <30 08/13/2017 04:38 PM    Microalb/Creat ratio (ug/mg creat.) 6.6 12/01/2016 04:10 PM    Cholesterol, total 124 07/21/2016 11:17 AM    HDL Cholesterol 59 07/21/2016 11:17 AM    LDL, calculated 53 07/21/2016 11:17 AM    LDL,Direct 87 08/13/2017 03:48 PM    Triglyceride 58 07/21/2016 11:17 AM      Diabetic Foot and Eye Exam HM Status   Topic Date Due   ??? Eye Exam  01/29/2018 (Originally 04/18/1936)   ??? Diabetic Foot Care  01/29/2018       Orders:  -     HM DIABETES FOOT EXAM    3. Acute ischemic stroke [facility](HCC)  [facility]  [facility]  Assessment & Plan:  This  condition is managed by Specialist.   Key Neurological Meds             LYRICA 100 mg capsule  (Taking) TAKE 1 CAPSULE BY MOUTH EVERY DAY    clopidogrel (PLAVIX) 75 mg tab  (Taking) TAKE 1 TABLET BY MOUTH DAILY    aspirin delayed-release 81 mg tablet  (Taking) Take  by mouth daily.        Lab Results   Component Value Date/Time    WBC 10.6 07/21/2016 11:17 AM    HGB 12.6 07/21/2016 11:17 AM    HCT 39.9 07/21/2016 11:17 AM    PLATELET 268 07/21/2016 11:17 AM    Creatinine 0.92 08/13/2017 03:48 PM    BUN 14 08/13/2017 03:48 PM         4. Pulmonary fibrosis (HCC)  Assessment & Plan:  Stable, based on history, physical exam and review of pertinent labs, studies and medications; meds reconciled; continue current treatment plan.  Key COPD Medications     Patient is on no COPD/Asthma meds.        Lab Results   Component Value Date/Time    WBC 10.6 07/21/2016 11:17 AM    HGB 12.6 07/21/2016 11:17 AM    HCT 39.9 07/21/2016 11:17 AM    PLATELET 268 07/21/2016 11:17 AM       Orders:  -     CT CHEST WO CONT; Future    5. At high risk for falls  Comments:  DMV form, Rx for walker.  she is getting home health PT/OT    6. Neuropathy  Comments:  Rx for diabetic shoes.  FTF for DME  Orders:  -     HM DIABETES FOOT EXAM    7. Refused influenza vaccine  Comments:  she declined flu/shingrix/Tdap/pneumovax    8. Essential hypertension  Comments:  improving.  running 128/74 - 143/63 at home per daughter    57. Wong-Baker pain scale rating 4, hurts little more  -     pregabalin (LYRICA) 100 mg capsule; TAKE 1 CAPSULE BY MOUTH EVERY DAY    10. Hyperlipidemia, unspecified hyperlipidemia type  -     atorvastatin (LIPITOR) 10 mg tablet; TAKE 1 TABLET BY MOUTH DAILY     11. History of CVA (cerebrovascular accident)  -     clopidogrel (PLAVIX) 75 mg tab; TAKE 1 TABLET BY MOUTH DAILY    12. Controlled type 2 diabetes mellitus without complication, without long-term current use of insulin (HCC)  Comments:  adust metformin to QD.  she does not want to try off Rx  Orders:  -     metFORMIN (GLUCOPHAGE) 500 mg tablet; TAKE 1 TABLET BY MOUTH DAILY WITH BREAKFAST    13. Abnormal foot finding    Other orders  -     mirabegron ER (MYRBETRIQ) 50 mg ER tablet; Take 1 Tab by mouth daily.  -     amLODIPine (NORVASC) 10 mg tablet; TAKE 1 TABLET BY MOUTH DAILY FOR HIGH BLOOD PRESSURE    See notes from 08/13/2017    Plan:  Orders Placed This Encounter   ??? mirabegron (MYRBETRIQ PO)     Sig: Take 50 mg by mouth daily.       See patient instructions  There are no Patient Instructions on file for this visit.      Arrange diagnoses:  done    Check meds:  done    AVS Printed:  done

## 2017-08-21 NOTE — Assessment & Plan Note (Signed)
This condition is managed by Specialist.  Key Antihyperglycemic Medications             metFORMIN (GLUCOPHAGE) 500 mg tablet  (Taking) TAKE 1 TABLET BY MOUTH DAILY WITH BREAKFAST        Other Key Diabetic Medications             losartan (COZAAR) 25 mg tablet  (Taking) TAKE 1 TABLET BY MOUTH ONCE DAILY    LYRICA 100 mg capsule  (Taking) TAKE 1 CAPSULE BY MOUTH EVERY DAY    atorvastatin (LIPITOR) 10 mg tablet  (Taking) TAKE 1 TABLET BY MOUTH DAILY        Lab Results   Component Value Date/Time    Hemoglobin A1c 6.9 08/13/2017 03:48 PM    Glucose 107 08/13/2017 03:48 PM    Creatinine 0.92 08/13/2017 03:48 PM    Microalbumin/creat ratio (POC) <30 08/13/2017 04:38 PM    Microalb/Creat ratio (ug/mg creat.) 6.6 12/01/2016 04:10 PM    Cholesterol, total 124 07/21/2016 11:17 AM    HDL Cholesterol 59 07/21/2016 11:17 AM    LDL, calculated 53 07/21/2016 11:17 AM    LDL,Direct 87 08/13/2017 03:48 PM    Triglyceride 58 07/21/2016 11:17 AM     Diabetic Foot and Eye Exam HM Status   Topic Date Due   ??? Eye Exam  01/29/2018 (Originally 04/18/1936)   ??? Diabetic Foot Care  01/29/2018

## 2017-08-21 NOTE — Progress Notes (Signed)
1. Have you been to the ER, urgent care clinic since your last visit?  Hospitalized since your last visit? No    2. Have you seen or consulted any other health care providers outside of [facility] since your last visit?  Include any pap smears or colon screening. No    Chief Complaint   Patient presents with   ??? Annual Wellness Visit       Blood pressure 168/80, pulse 74, temperature 97.5 ??F (36.4 ??C), temperature source Oral, resp. rate 16, height 4\' 7"  (1.397 m), weight 126 lb (57.2 kg), last menstrual period 10/23/1978, SpO2 100 %.

## 2017-08-21 NOTE — Assessment & Plan Note (Signed)
This condition is managed by Specialist.   Key Neurological Meds             LYRICA 100 mg capsule  (Taking) TAKE 1 CAPSULE BY MOUTH EVERY DAY    clopidogrel (PLAVIX) 75 mg tab  (Taking) TAKE 1 TABLET BY MOUTH DAILY    aspirin delayed-release 81 mg tablet  (Taking) Take  by mouth daily.        Lab Results   Component Value Date/Time    WBC 10.6 07/21/2016 11:17 AM    HGB 12.6 07/21/2016 11:17 AM    HCT 39.9 07/21/2016 11:17 AM    PLATELET 268 07/21/2016 11:17 AM    Creatinine 0.92 08/13/2017 03:48 PM    BUN 14 08/13/2017 03:48 PM

## 2017-08-21 NOTE — Patient Instructions (Addendum)
CXR 07/2017 = No pneumonia is seen. ??I suspect lung scarring is causing the sound that I hear.    Consider CT scan of chest.  Call [phone] to schedule    [facility]    [facility] given for diabetic shoes and walker    Continue home health PT/OT    See neurologist Dr. Jettie PaganEpps [phone]    See heart specialist Dr. Lorella Nimrodoloresco [phone]    Call  for an appointment then call our referral coordinator [phone]) with the date and time and the first and last name of who you will be seeing so we can authorize your referral with your insurance.

## 2017-08-23 ENCOUNTER — Encounter: Primary: Family Medicine

## 2017-09-06 MED ORDER — POLYETHYLENE GLYCOL 3350 100 % ORAL POWDER
17 gram/dose | ORAL | 0 refills | Status: AC
Start: 2017-09-06 — End: ?

## 2017-09-06 MED ORDER — POLYETHYLENE GLYCOL 3350 100 % ORAL POWDER
17 gram/dose | ORAL | 0 refills | Status: DC
Start: 2017-09-06 — End: 2017-11-20

## 2017-09-25 ENCOUNTER — Ambulatory Visit: Admit: 2017-09-25 | Discharge: 2017-09-25 | Payer: MEDICARE | Attending: Family Medicine | Primary: Family Medicine

## 2017-09-25 ENCOUNTER — Inpatient Hospital Stay: Admit: 2017-12-24 | Payer: MEDICARE | Primary: Family Medicine

## 2017-09-25 DIAGNOSIS — N390 Urinary tract infection, site not specified: Secondary | ICD-10-CM

## 2017-09-25 LAB — AMB POC URINALYSIS DIP STICK AUTO W/O MICRO
Bilirubin (UA POC): NEGATIVE
Blood (UA POC): NEGATIVE
Ketones (UA POC): NEGATIVE
Leukocyte esterase (UA POC): NEGATIVE
Nitrites (UA POC): POSITIVE
Protein (UA POC): NEGATIVE
Specific gravity (UA POC): 1.02 (ref 1.001–1.035)
Urobilinogen (UA POC): 0.2 (ref 0.2–1)
pH (UA POC): 5 (ref 4.6–8.0)

## 2017-09-25 MED ORDER — AMOXICILLIN-CLAVULANATE 500 MG-125 MG TAB
500-125 mg | ORAL_TABLET | Freq: Two times a day (BID) | ORAL | 0 refills | Status: AC
Start: 2017-09-25 — End: 2017-09-28

## 2017-09-25 MED ORDER — AMOXICILLIN CLAVULANATE 875 MG-125 MG TAB
875-125 mg | ORAL_TABLET | Freq: Two times a day (BID) | ORAL | 0 refills | Status: DC
Start: 2017-09-25 — End: 2017-09-25

## 2017-09-25 MED ORDER — PREGABALIN 100 MG CAP
100 mg | ORAL_CAPSULE | ORAL | 3 refills | Status: DC
Start: 2017-09-25 — End: 2018-04-02

## 2017-09-25 NOTE — Progress Notes (Signed)
Chief Complaint   Patient presents with   ??? Urinary Burning     X 2 weeks; burning

## 2017-09-25 NOTE — Progress Notes (Signed)
[facility]  Subjective:   Chelsea Schultz is a 83 y.o. female with past medical history as below  CC: Dysuria  History provided by patient and chart review    HPI:  Endorses burning with urination, increased urinary frequency and incontinence for the last 2 weeks. Denies any associated fevers, chills, night sweats, nausea or vomiting. Endorses chronic back pain but denies any flank pain. Daughter at bedside endorses a hx of recurrent UTIs as pt has been diagnosed with mild bladder prolapse.  Last UTI was October of this year and treated with Augmentin.      Past Medical History:   Diagnosis Date   ??? Arthritis    ??? CVA (cerebral infarction)    ??? Diabetes (HCC) 01/09/2015    Hemoglobin A1C 6.4 (per records, done at outside facility)   ??? GERD (gastroesophageal reflux disease)    ??? History of recurrent UTIs     on chronic antibiotic prophylaxis, followed by urology   ??? HLD (hyperlipidemia) 01/09/2015    Total Chol 141, Tri 49, HDL 59, LDL 72   ??? Hypertension    ??? Neuropathy    ??? Osteopetrosis    ??? Spinal stenosis      No Known Allergies  Current Outpatient Medications on File Prior to Visit   Medication Sig Dispense Refill   ??? polyethylene glycol (MIRALAX) 17 gram/dose powder MIX 17 GRAMS IN 8 OUNCES OF FLUID AND DRINK EVERY DAY AS DIRECTED 527 g 0   ??? polyethylene glycol (MIRALAX) 17 gram/dose powder MIX 17 GRAMS IN 8 OUNCES OF FLUID AND DRINK EVERY DAY AS DIRECTED 527 g 0   ??? mirabegron ER (MYRBETRIQ) 50 mg ER tablet Take 1 Tab by mouth daily. 90 Tab 3   ??? amLODIPine (NORVASC) 10 mg tablet TAKE 1 TABLET BY MOUTH DAILY FOR HIGH BLOOD PRESSURE 90 Tab 3   ??? pregabalin (LYRICA) 100 mg capsule TAKE 1 CAPSULE BY MOUTH EVERY DAY 90 Cap 3   ??? atorvastatin (LIPITOR) 10 mg tablet TAKE 1 TABLET BY MOUTH DAILY 90 Tab 3   ??? clopidogrel (PLAVIX) 75 mg tab TAKE 1 TABLET BY MOUTH DAILY 90 Tab 3   ??? metFORMIN (GLUCOPHAGE) 500 mg tablet TAKE 1 TABLET BY MOUTH DAILY WITH BREAKFAST 90 Tab 3    ??? losartan (COZAAR) 25 mg tablet TAKE 1 TABLET BY MOUTH ONCE DAILY 90 Tab 3   ??? nizatidine (AXID) 150 mg cap capsule Take 1 Cap by mouth nightly. (Patient taking differently: Take 150 mg by mouth as needed.) 90 Cap 1   ??? aspirin delayed-release 81 mg tablet Take  by mouth daily.     ??? ascorbic acid (VITAMIN C) 1,000 mg tablet Take  by mouth.     ??? multivitamin with iron (DAILY MULTI-VITAMINS/IRON) tablet Take 1 tablet by mouth daily.     ??? calcium-cholecalciferol, D3, (CALCIUM 600 + D) tablet Take 2 Tabs by mouth daily. 60 Tab 12     No current facility-administered medications on file prior to visit.      Family History   Problem Relation Age of Onset   ??? Diabetes Mother    ??? Stroke Mother    ??? Hypertension Mother    ??? Hypertension Father    ??? Stroke Father    ??? Cancer Sister    ??? Stroke Sister      Social History     Socioeconomic History   ??? Marital status: WIDOWED     Spouse  name: Not on file   ??? Number of children: Not on file   ??? Years of education: Not on file   ??? Highest education level: Not on file   Tobacco Use   ??? Smoking status: Never Smoker   ??? Smokeless tobacco: Never Used   Substance and Sexual Activity   ??? Alcohol use: No   ??? Drug use: No   ??? Sexual activity: No     Past Surgical History:   Procedure Laterality Date   ??? HX ORTHOPAEDIC      Lumbar laminectomy   ??? HX ORTHOPAEDIC      Lumbar fusion       Patient Active Problem List   Diagnosis Code   ??? HTN (hypertension) I10   ??? Urgency incontinence N39.41   ??? Recurrent UTI N39.0   ??? Acute ischemic stroke (HCC) I63.9   ??? Advance directive discussed with patient Z71.89   ??? At risk for falling Z91.81   ??? Pulmonary fibrosis (HCC) J84.10   ??? 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 Abnormal foot finding R29.91           Review of Systems   All other systems reviewed and are negative.        Objective:     Visit Vitals  BP 162/76   Pulse 85   Temp 97.8 ??F (36.6 ??C)   Resp 16   Ht 4\' 7"  (1.397 m)   Wt 123 lb (55.8 kg)   LMP 10/23/1978   SpO2 99%    BMI 28.59 kg/m??        Physical Exam   Constitutional:   AOX3, NAD   Eyes: Conjunctivae and EOM are normal. Pupils are equal, round, and reactive to light.   Cardiovascular: Normal rate, regular rhythm and normal heart sounds.   Pulmonary/Chest: Effort normal and breath sounds normal. No respiratory distress.   Abdominal: Soft. Bowel sounds are normal. She exhibits no distension. There is no tenderness.   Genitourinary:   Genitourinary Comments: No suprapubic tenderness or flank pain       Pertinent Labs/Studies:      Assessment and orders:   Diagnoses and all orders for this visit:    Urinary tract infection without hematuria, site unspecified  -     AMB POC URINALYSIS DIP STICK AUTO W/O MICRO -  Positive Nitrites  -     Follow-up urine CULTURE, URINE  -     amoxicillin-clavulanate (AUGMENTIN) 500-125 mg per tablet; Take 1 Tab by mouth two (2) times a day for 3 days., Normal, Disp-6 Tab, R-0  -     RTO for unremitting symptoms  I have reviewed patient medical and social history and medications.  I have reviewed pertinent labs results and other data. I have discussed the diagnosis with the patient and the intended plan as seen in the above orders. The patient has received an after-visit summary and questions were answered concerning future plans. I have discussed medication side effects and warnings with the patient as well.    Eden EmmsBleck B Aleksis Jiggetts, MD  [facility]  09/25/17    Patient discussed with Dr. Robby SermonHendricks, Attending Physician

## 2017-09-25 NOTE — Progress Notes (Signed)
Reviewed. Pan sensitive Ecoli and Beta hemolytic Strep. Pt treated with augmentin.

## 2017-09-25 NOTE — Progress Notes (Signed)
83 year old female with UTI sxs  Hx recurrent UTIs    Last UTI was in October 2018    Urine culture sent    rx augmentin    F/U prn    I reviewed with the resident the medical history and the resident's findings on the physical examination.  I discussed with the resident the patient's diagnosis and concur with the plan.

## 2017-09-26 ENCOUNTER — Ambulatory Visit: Admit: 2017-09-26 | Discharge: 2017-09-26 | Payer: MEDICARE | Attending: Family Medicine | Primary: Family Medicine

## 2017-09-26 MED ORDER — LOSARTAN 50 MG TAB
50 mg | ORAL_TABLET | Freq: Every day | ORAL | 3 refills | Status: DC
Start: 2017-09-26 — End: 2017-09-27

## 2017-09-26 NOTE — Patient Instructions (Signed)
Acute High Blood Pressure: Care Instructions  Your Care Instructions    Acute high blood pressure is very high blood pressure. It's a serious problem. Very high blood pressure can damage your brain, heart, eyes, and kidneys.  You may have been given medicines to lower your blood pressure. You may have gotten them as pills or through a needle in one of your veins. This is called an IV. And maybe you were given other medicines too. These can be needed when high blood pressure causes other problems.  To keep your blood pressure at a lower level, you may need to make healthy lifestyle changes. And you will probably need to take medicines.  Be sure to follow up with your doctor about your blood pressure and what you can do about it.  Follow-up care is a key part of your treatment and safety. Be sure to make and go to all appointments, and call your doctor if you are having problems. It's also a good idea to know your test results and keep a list of the medicines you take.  How can you care for yourself at home?  ?? See your doctor as often as he or she recommends. This is to make sure your blood pressure is under control. You will probably go at least 2 times a year. But it may be more often at first.  ?? Take your blood pressure medicine exactly as prescribed. You may take one or more types. They include diuretics, beta-blockers, ACE inhibitors, calcium channel blockers, and angiotensin II receptor blockers. Call your doctor if you think you are having a problem with your medicine.  ?? If you take blood pressure medicine, talk to your doctor before you take decongestants or anti-inflammatory medicine, such as ibuprofen. These can raise blood pressure.  ?? Learn how to check your blood pressure at home. Check it often.  ?? Ask your doctor if it's okay to drink alcohol.  ?? Talk to your doctor about lifestyle changes that can help blood pressure. These include being active and not smoking.  When should you call for help?   Call 911 anytime you think you may need emergency care. This may mean having symptoms that suggest that your blood pressure is causing a serious heart or blood vessel problem. Your blood pressure may be over 180/120.  ??For example, call 911 if:  ?? ?? You have symptoms of a heart attack. These may include:  ? Chest pain or pressure, or a strange feeling in the chest.  ? Sweating.  ? Shortness of breath.  ? Nausea or vomiting.  ? Pain, pressure, or a strange feeling in the back, neck, jaw, or upper belly or in one or both shoulders or arms.  ? Lightheadedness or sudden weakness.  ? A fast or irregular heartbeat.   ?? ?? You have symptoms of a stroke. These may include:  ? Sudden numbness, tingling, weakness, or loss of movement in your face, arm, or leg, especially on only one side of your body.  ? Sudden vision changes.  ? Sudden trouble speaking.  ? Sudden confusion or trouble understanding simple statements.  ? Sudden problems with walking or balance.  ? A sudden, severe headache that is different from past headaches.   ?? ?? You have severe back or belly pain.   ??Do not wait until your blood pressure comes down on its own. Get help right away.  ??Call your doctor now or seek immediate care if:  ?? ?? Your   blood pressure is much higher than normal (such as 180/120 or higher), but you don't have symptoms.   ?? ?? You think high blood pressure is causing symptoms, such as:  ? Severe headache.  ? Blurry vision.   ??Watch closely for changes in your health, and be sure to contact your doctor if:  ?? ?? Your blood pressure measures higher than your doctor recommends at least 2 times. That means the top number is higher or the bottom number is higher, or both.   ?? ?? You think you may be having side effects from your blood pressure medicine.   Where can you learn more?  Go to InsuranceStats.cahttp://www.healthwise.net/GoodHelpConnections.  Enter [phone] in the search box to learn more about "Acute High Blood Pressure: Care Instructions."   Current as of: September 27, 2016  Content Version: 11.8  ?? 2006-2018 [employer]. Care instructions adapted under license by [employer] (which disclaims liability or warranty for this information). If you have questions about a medical condition or this instruction, always ask your healthcare professional. Healthwise, Incorporated disclaims any warranty or liability for your use of this information.         Osteoporosis: Care Instructions  Your Care Instructions    Osteoporosis causes bones to become thin and weak. It is much more common in women than in men. Osteoporosis may be very advanced before you know you have it. Sometimes the first sign is a broken bone in the hip, spine, or wrist or sudden pain in your middle or lower back.  Follow-up care is a key part of your treatment and safety. Be sure to make and go to all appointments, and call your doctor if you are having problems. It's also a good idea to know your test results and keep a list of the medicines you take.  How can you care for yourself at home?  ?? Your doctor may prescribe a bisphosphonate, such as risedronate (Actonel) or alendronate (Fosamax), for osteoporosis. If you are taking one of these medicines by mouth:  ? Take your medicine with a full glass of water when you first get up in the morning.  ? Do not lie down, eat, drink a beverage, or take any other medicine for at least 30 minutes after taking the drug. This helps prevent stomach problems.  ? Do not take your medicine late in the day if you forgot to take it in the morning. Skip it, and take the usual dose the next morning.  ? If you have side effects, tell your doctor. He or she may prescribe another medicine.  ?? Get enough calcium and vitamin D. The Institute of Medicine recommends adults younger than age 81 need 1,000 mg of calcium and 600 IU of vitamin D each day. Women ages 4751 to 6870 need 1,200 mg of calcium and 600 IU of  vitamin D each day. Men ages 7551 to 6170 need 1,000 mg of calcium and 600 IU of vitamin D each day. Adults 71 and older need 1,200 mg of calcium and 800 IU of vitamin D each day.  ? Eat foods rich in calcium, like yogurt, cheese, milk, and dark green vegetables. This is a good way to get the calcium you need. You can get vitamin D from eggs, fatty fish, cereal, and milk.  ? Talk to your doctor about taking a calcium plus vitamin D supplement. Be careful, though. Adults ages 5719 to 6750 should not get more than 2,500 mg of calcium and  4,000 IU of vitamin D each day, whether it is from supplements and/or food. Adults ages 7051 and older should not get more than 2,000 mg of calcium and 4,000 IU of vitamin D each day from supplements and/or food.  ?? Limit alcohol to 2 drinks a day for men and 1 drink a day for women. Too much alcohol can cause health problems.  ?? Do not smoke. Smoking puts you at a much higher risk for osteoporosis. If you need help quitting, talk to your doctor about stop-smoking programs and medicines. These can increase your chances of quitting for good.  ?? Get regular bone-building exercise. Weight-bearing and resistance exercises keep bones healthy by working the muscles and bones against gravity. Start out at an exercise level that feels right for you. Add a little at a time until you can do the following:  ? Do 30 minutes of weight-bearing exercise on most days of the week. Walking, jogging, stair climbing, and dancing are good choices.  ? Do resistance exercises with weights or elastic bands 2 to 3 days a week.  ?? Reduce your risk of falls:  ? Wear supportive shoes with low heels and nonslip soles.  ? Use a cane or walker, if you need it. Use shower chairs and bath benches. Put in handrails on stairways, around your shower or tub area, and near the toilet.  ? Keep stairs, porches, and walkways well lit. Use night-lights.  ? Remove throw rugs and other objects that are in the way.   ? Avoid icy, wet, or slippery surfaces.  ? Keep a cordless phone and a flashlight with new batteries by your bed.  When should you call for help?  Watch closely for changes in your health, and be sure to contact your doctor if you have any problems.  Where can you learn more?  Go to InsuranceStats.cahttp://www.healthwise.net/GoodHelpConnections.  Enter K100 in the search box to learn more about "Osteoporosis: Care Instructions."  Current as of: January 05, 2017  Content Version: 11.8  ?? 2006-2018 Healthwise, Inc[employer]. Care instructions adapted under license by [employer] (which disclaims liability or warranty for this information). If you have questions about a medical condition or this instruction, always ask your healthcare professional. Healthwise, Incorporated disclaims any warranty or liability for your use of this information.

## 2017-09-26 NOTE — Telephone Encounter (Signed)
[facility]  [facility]  [facility] Therapist calling and states that patient blood pressure reading is 184/94 and has been escalating. Patient having symptoms of dizziness.    Nurse Antionette CharJuanita P  Assisted with call

## 2017-09-26 NOTE — Progress Notes (Signed)
Identified Patient with two Patient identifiers (Name and DOB). Two Patient Identifiers confirmed. Reviewed record in preparation for visit and have obtained necessary documentation.    Chief Complaint   Patient presents with   ??? Hypertension       Visit Vitals  BP 169/81 (BP 1 Location: Left arm, BP Patient Position: Sitting)   Pulse 95   Temp 97.5 ??F (36.4 ??C) (Oral)   Resp 18   Ht 4\' 7"  (1.397 m)   Wt 123 lb (55.8 kg)   SpO2 94%   BMI 28.59 kg/m??       1. Have you been to the ER, urgent care clinic since your last visit?  Hospitalized since your last visit?No    2. Have you seen or consulted any other health care providers [facility] since your last visit?  Include any pap smears or colon screening. No    Blood pressure readings:  10:15 AM - 184/92, 172/78, 188/91  10:45 AM - 135/60  2:15 PM - 171/70

## 2017-09-26 NOTE — Telephone Encounter (Signed)
[facility] saying the daughter, Lynden AngCathy, is on the line to inform of an elevated blood pressure reading of 184/92.      Nurse aware.

## 2017-09-26 NOTE — Progress Notes (Signed)
Chelsea Schultz is a 83 y.o. female here today to address the following issues:  Chief Complaint   Patient presents with   ??? Hypertension     Home BP reviewed and still elevated.  Compliant with medication and no issues with them.  Switched to Cozaar a few months ago.      Hx of osteoporosis and noted to be hypercalcemic last BMP.   Taking Vit D and calcium, last DEXA 2010    Past Medical History:   Diagnosis Date   ??? Arthritis    ??? CVA (cerebral infarction)    ??? Diabetes (HCC) 01/09/2015    Hemoglobin A1C 6.4 (per records, done at outside facility)   ??? GERD (gastroesophageal reflux disease)    ??? History of recurrent UTIs     on chronic antibiotic prophylaxis, followed by urology   ??? HLD (hyperlipidemia) 01/09/2015    Total Chol 141, Tri 49, HDL 59, LDL 72   ??? Hypertension    ??? Neuropathy    ??? Osteopetrosis    ??? Spinal stenosis      Past Surgical History:   Procedure Laterality Date   ??? HX ORTHOPAEDIC      Lumbar laminectomy   ??? HX ORTHOPAEDIC      Lumbar fusion     Social History     Socioeconomic History   ??? Marital status: WIDOWED     Spouse name: Not on file   ??? Number of children: Not on file   ??? Years of education: Not on file   ??? Highest education level: Not on file   Social Needs   ??? Financial resource strain: Not on file   ??? Food insecurity - worry: Not on file   ??? Food insecurity - inability: Not on file   ??? Transportation needs - medical: Not on file   ??? Transportation needs - non-medical: Not on file   Occupational History   ??? Not on file   Tobacco Use   ??? Smoking status: Never Smoker   ??? Smokeless tobacco: Never Used   Substance and Sexual Activity   ??? Alcohol use: No   ??? Drug use: No   ??? Sexual activity: No   Other Topics Concern   ??? Not on file   Social History Narrative   ??? Not on file       No Known Allergies    Current Outpatient Medications   Medication Sig   ??? losartan (COZAAR) 50 mg tablet Take 1 Tab by mouth daily.   ??? amoxicillin-clavulanate (AUGMENTIN) 500-125 mg per tablet Take 1 Tab by  mouth two (2) times a day for 3 days.   ??? pregabalin (LYRICA) 100 mg capsule TAKE 1 CAPSULE BY MOUTH EVERY DAY   ??? polyethylene glycol (MIRALAX) 17 gram/dose powder MIX 17 GRAMS IN 8 OUNCES OF FLUID AND DRINK EVERY DAY AS DIRECTED   ??? polyethylene glycol (MIRALAX) 17 gram/dose powder MIX 17 GRAMS IN 8 OUNCES OF FLUID AND DRINK EVERY DAY AS DIRECTED   ??? mirabegron ER (MYRBETRIQ) 50 mg ER tablet Take 1 Tab by mouth daily.   ??? amLODIPine (NORVASC) 10 mg tablet TAKE 1 TABLET BY MOUTH DAILY FOR HIGH BLOOD PRESSURE   ??? atorvastatin (LIPITOR) 10 mg tablet TAKE 1 TABLET BY MOUTH DAILY   ??? clopidogrel (PLAVIX) 75 mg tab TAKE 1 TABLET BY MOUTH DAILY   ??? metFORMIN (GLUCOPHAGE) 500 mg tablet TAKE 1 TABLET BY MOUTH DAILY WITH BREAKFAST   ??? nizatidine (  AXID) 150 mg cap capsule Take 1 Cap by mouth nightly. (Patient taking differently: Take 150 mg by mouth as needed.)   ??? aspirin delayed-release 81 mg tablet Take  by mouth daily.   ??? ascorbic acid (VITAMIN C) 1,000 mg tablet Take  by mouth.   ??? multivitamin with iron (DAILY MULTI-VITAMINS/IRON) tablet Take 1 tablet by mouth daily.   ??? calcium-cholecalciferol, D3, (CALCIUM 600 + D) tablet Take 2 Tabs by mouth daily.     No current facility-administered medications for this visit.        Review of Systems   Constitutional: Negative for chills and fever.   Respiratory: Negative for shortness of breath and wheezing.    Cardiovascular: Negative for chest pain and palpitations.   Gastrointestinal: Negative for abdominal pain, diarrhea and vomiting.       Visit Vitals  BP 169/81 (BP 1 Location: Left arm, BP Patient Position: Sitting)   Pulse 95   Temp 97.5 ??F (36.4 ??C) (Oral)   Resp 18   Ht 4\' 7"  (1.397 m)   Wt 123 lb (55.8 kg)   LMP 10/23/1978   SpO2 94%   BMI 28.59 kg/m??       Physical Exam   Constitutional: She is well-developed, well-nourished, and in no distress. No distress.   HENT:   Head: Normocephalic and atraumatic.    Eyes: Conjunctivae are normal. Right eye exhibits no discharge. Left eye exhibits no discharge.   Neck: Neck supple.   Cardiovascular: Normal rate, regular rhythm and normal heart sounds. Exam reveals no gallop and no friction rub.   No murmur heard.  Pulmonary/Chest: Effort normal and breath sounds normal. No respiratory distress. She has no wheezes. She has no rales.   Abdominal: Soft.   Neurological: She is alert.   Skin: Skin is warm and dry. She is not diaphoretic.   Psychiatric: Affect normal.   Nursing note and vitals reviewed.      No results found for this or any previous visit (from the past 12 hour(s)).    1. Hypercalcemia  -recheck labs, asymptomatic.    - METABOLIC PANEL, COMPREHENSIVE  - VITAMIN D, 25 HYDROXY    2. Essential hypertension  -Check labs below.  Increase Losartan to 50mg .  Continue to check home BP.  She has been high for several months and will take our time with increasing medications.  - METABOLIC PANEL, COMPREHENSIVE    3. Other osteoporosis, unspecified pathological fracture presence  -Repeat DEXA.  This may help in deciding management as her Ca+ is high  - DEXA BONE DENSITY STUDY AXIAL; Future  - VITAMIN D, 25 HYDROXY    Patient encounter was at least 25 minutes with more than 50 percent of the visit involved in counseling      Follow-up Disposition:  Return in about 3 weeks (around 10/17/2017) for Discuss labs, DXA and HTN.    Salley Scarletimothy Leonard Hendler, MD, CAQSM, RMSK

## 2017-09-27 MED ORDER — LOSARTAN 50 MG TAB
50 mg | ORAL_TABLET | ORAL | 3 refills | Status: DC
Start: 2017-09-27 — End: 2019-07-12

## 2017-09-28 LAB — CULTURE, URINE

## 2017-10-10 NOTE — Telephone Encounter (Signed)
Judeth CornfieldStephanie from [facility] called about form being signed and dated by physician.    It was faxed to the phone room and put at front desk.

## 2017-10-12 ENCOUNTER — Ambulatory Visit: Payer: MEDICARE | Primary: Family Medicine

## 2017-11-05 NOTE — Telephone Encounter (Signed)
Have not seen this patient for this. If this is for constipation, this could be from a medication they are taking or another reason.  I typically will prescribe this 1-2 weeks and reevaluation.  Looks like the last Rx was for a month and this was prescribed a few months ago.  They should be evaluated prior to refilling

## 2017-11-05 NOTE — Telephone Encounter (Signed)
Attempted to call patient to notify an appointment is required per Dr. Cathie HoopsYu for medication refill of Miralax.   Unable to leave voicemail due to voicemail box full.

## 2017-11-06 NOTE — Telephone Encounter (Signed)
Second attempt at calling patient to notify an appointment is required per Dr. Cathie HoopsYu for medication refill of Miralax.  Unable to leave voicemail due to voicemail box is full.

## 2017-11-07 ENCOUNTER — Inpatient Hospital Stay: Admit: 2018-01-14 | Payer: MEDICARE | Primary: Family Medicine

## 2017-11-07 ENCOUNTER — Inpatient Hospital Stay: Admit: 2017-11-07 | Payer: MEDICARE | Attending: Family Medicine | Primary: Family Medicine

## 2017-11-07 ENCOUNTER — Other Ambulatory Visit: Admit: 2017-11-07 | Discharge: 2017-11-07 | Payer: MEDICARE | Primary: Family Medicine

## 2017-11-07 ENCOUNTER — Telehealth

## 2017-11-07 DIAGNOSIS — M818 Other osteoporosis without current pathological fracture: Secondary | ICD-10-CM

## 2017-11-07 NOTE — Telephone Encounter (Signed)
Dr. Jean RosenthalJackson had written rx for diabetic shoes the shingles vaccine and a walker. Patient is requesting these prescriptions to be written by another physician. Please advise, thank you!

## 2017-11-07 NOTE — Progress Notes (Signed)
Labs drawn.

## 2017-11-08 LAB — METABOLIC PANEL, COMPREHENSIVE
A-G Ratio: 1.3 (ref 1.2–2.2)
ALT (SGPT): 16 IU/L (ref 0–32)
AST (SGOT): 22 IU/L (ref 0–40)
Albumin: 4.2 g/dL (ref 3.5–4.7)
Alk. phosphatase: 82 IU/L (ref 39–117)
BUN/Creatinine ratio: 19 (ref 12–28)
BUN: 15 mg/dL (ref 8–27)
Bilirubin, total: 0.2 mg/dL (ref 0.0–1.2)
CO2: 23 mmol/L (ref 20–29)
Calcium: 10.4 mg/dL — ABNORMAL HIGH (ref 8.7–10.3)
Chloride: 102 mmol/L (ref 96–106)
Creatinine: 0.81 mg/dL (ref 0.57–1.00)
GFR est AA: 74 mL/min/{1.73_m2} (ref >59)
GFR est non-AA: 65 mL/min/{1.73_m2} (ref >59)
GLOBULIN, TOTAL: 3.3 g/dL (ref 1.5–4.5)
Glucose: 85 mg/dL (ref 65–99)
Potassium: 4.2 mmol/L (ref 3.5–5.2)
Protein, total: 7.5 g/dL (ref 6.0–8.5)
Sodium: 141 mmol/L (ref 134–144)

## 2017-11-08 LAB — VITAMIN D, 25 HYDROXY: VITAMIN D, 25-HYDROXY: 43.3 ng/mL (ref 30.0–100.0)

## 2017-11-08 NOTE — Telephone Encounter (Signed)
Unable to reach patient after multiple attempts. Patient did not answer phone and VM box is full.    Scripts written and mailed to patient along with lab results.    Patient is also to schedule follow up appointment to discuss lab results as soon as possible.

## 2017-11-09 NOTE — Telephone Encounter (Signed)
Patient daughter would like to be contacted ASAP with lab results for her mother. She's already aware letter mailed and has scheduled her follow up appt also.    Please call [phone]

## 2017-11-09 NOTE — Telephone Encounter (Signed)
Spoke with daughter [name] on hippa requesting patient labs results. Daugter notified of results sent in letter. Notified daughter follow up appointment scheduled is to discuss lab result per Dr. Cathie HoopsYu. Daughter verbalized understanding.

## 2017-11-15 ENCOUNTER — Encounter: Admit: 2017-11-15 | Primary: Family Medicine

## 2017-11-15 ENCOUNTER — Ambulatory Visit: Admit: 2017-11-15 | Payer: MEDICARE | Attending: Family Medicine | Primary: Family Medicine

## 2017-11-15 ENCOUNTER — Encounter

## 2017-11-15 ENCOUNTER — Inpatient Hospital Stay: Admit: 2018-01-16 | Payer: MEDICARE | Primary: Family Medicine

## 2017-11-15 DIAGNOSIS — G629 Polyneuropathy, unspecified: Secondary | ICD-10-CM

## 2017-11-15 DIAGNOSIS — M545 Low back pain, unspecified: Secondary | ICD-10-CM

## 2017-11-15 DIAGNOSIS — M818 Other osteoporosis without current pathological fracture: Secondary | ICD-10-CM

## 2017-11-15 LAB — AMB POC URINALYSIS DIP STICK AUTO W/O MICRO
Bilirubin (UA POC): NEGATIVE
Blood (UA POC): NEGATIVE
Ketones (UA POC): NEGATIVE
Leukocyte esterase (UA POC): NEGATIVE
Nitrites (UA POC): NEGATIVE
Protein (UA POC): NEGATIVE
Specific gravity (UA POC): 1.015 (ref 1.001–1.035)
Urobilinogen (UA POC): 0.2 (ref 0.2–1)
pH (UA POC): 5.5 (ref 4.6–8.0)

## 2017-11-15 MED ORDER — LOSARTAN 100 MG TAB
100 mg | ORAL_TABLET | Freq: Every day | ORAL | 1 refills | Status: DC
Start: 2017-11-15 — End: 2017-11-15

## 2017-11-15 NOTE — Patient Instructions (Signed)
Deciding About Bisphosphonate Medicine for Osteoporosis  How can you decide about taking bisphosphonate medicine for osteoporosis?  What is osteoporosis?  Osteoporosis is a disease that affects your bones. It means you have bones that are thin and brittle and have lots of holes inside them like a sponge. This makes them easy to break. It also increases your risk for spine and hip fractures. These fractures may make it hard for you to live on your own.  Bisphosphonates are the most common medicines used to prevent bone loss. Most of the time, you take them as pills. They slow the way bone dissolves and is absorbed by your body. They can increase bone thickness and strength.  What are key points about this decision?  ?? If you have osteoporosis, these medicines can increase bone thickness. And they can lower your risk of spine and hip fractures. You may also want to think about taking them if you have low bone density (sometimes called osteopenia) or risk factors for osteoporosis.  ?? These medicines can have side effects, such as heartburn and belly pain. They also can cause headaches. Their long-term effects are not known.  ?? Whether you take medicine or not, healthy habits can also help protect your bones. Talk to your doctor about taking calcium and vitamin D supplements. Get regular weight-bearing exercise. Cut back on alcohol. And if you smoke, quit.  Why might you choose to take bisphosphonate medicines?  ?? You have bone loss that is not normal for your age.  ?? You have osteoporosis or low bone density. Or you have risk factors for osteoporosis.  ?? You have been taking hormone therapy (HT) and stopped. Bone loss medicines can protect against rapid bone loss after you quit HT.  ?? You do not mind taking pills. Or you do not mind getting medicines through a tube in your vein, called an IV.  ?? You think the benefits of taking these medicines outweigh the side effects.   Why might you choose not to take these medicines?  ?? You want to try other medicines that help prevent bone loss. These include calcitonin (Calcimar or Miacalcin), denosumab (Prolia or Xgeva), hormone therapy, raloxifene (Evista), and teriparatide (Forteo).  ?? You want to try healthy habits to prevent bone fractures.  ?? You do not like the idea of taking pills. Or you do not like getting medicines through a tube in your vein, called an IV.  ?? You are worried about the side effects of these medicines.  Your decision  Thinking about the facts and your feelings can help you make a decision that is right for you. Be sure you understand the benefits and risks of your options, and think about what else you need to do before you make the decision.  Where can you learn more?  Go to http://www.healthwise.net/GoodHelpConnections.  Enter W372 in the search box to learn more about "Deciding About Bisphosphonate Medicine for Osteoporosis."  Current as of: January 04, 2017  Content Version: 11.9  ?? 2006-2018 [employer]. Care instructions adapted under license by [employer] (which disclaims liability or warranty for this information). If you have questions about a medical condition or this instruction, always ask your healthcare professional. Healthwise, Incorporated disclaims any warranty or liability for your use of this information.

## 2017-11-15 NOTE — Progress Notes (Signed)
Chelsea Schultz is a 84 y.o. female here today to address the following issues:  Chief Complaint   Patient presents with   ??? Labs     follow up   ??? Urinary Burning     x 2 weeks      Complaining of burning with urination.   Symptoms started over a week ago.  Patient states she as a UTI about every 2 months.  Used to use topical estrogen but this was continued after her stroke.  Started cranberry but has not taken cranberry 650mg  and was making her nauseous at night.  Not drinking much fluids.     Also complaining of neuropathy. Does have a hx of B12 deficiency and abnormal thyroid. Pain starts in the low back and goes down to her leg.  Pain is sharp and crampy and started 2 weeks ago. No MOI.  Sometimes onset when she is walking. No incontinence reported.      Hx of osteoporosis.  Not sure of Vit D and calcium dose.  Never been treated for this.      Past Medical History:   Diagnosis Date   ??? Arthritis    ??? CVA (cerebral infarction)    ??? Diabetes (HCC) 01/09/2015    Hemoglobin A1C 6.4 (per records, done at outside facility)   ??? GERD (gastroesophageal reflux disease)    ??? History of recurrent UTIs     on chronic antibiotic prophylaxis, followed by urology   ??? HLD (hyperlipidemia) 01/09/2015    Total Chol 141, Tri 49, HDL 59, LDL 72   ??? Hypertension    ??? Neuropathy    ??? Osteopetrosis    ??? Spinal stenosis      Past Surgical History:   Procedure Laterality Date   ??? HX ORTHOPAEDIC      Lumbar laminectomy   ??? HX ORTHOPAEDIC      Lumbar fusion     Social History     Socioeconomic History   ??? Marital status: WIDOWED     Spouse name: Not on file   ??? Number of children: Not on file   ??? Years of education: Not on file   ??? Highest education level: Not on file   Social Needs   ??? Financial resource strain: Not on file   ??? Food insecurity - worry: Not on file   ??? Food insecurity - inability: Not on file   ??? Transportation needs - medical: Not on file   ??? Transportation needs - non-medical: Not on file   Occupational History    ??? Not on file   Tobacco Use   ??? Smoking status: Never Smoker   ??? Smokeless tobacco: Never Used   Substance and Sexual Activity   ??? Alcohol use: No   ??? Drug use: No   ??? Sexual activity: No   Other Topics Concern   ??? Not on file   Social History Narrative   ??? Not on file       No Known Allergies    Current Outpatient Medications   Medication Sig   ??? losartan (COZAAR) 100 mg tablet Take 1 Tab by mouth daily.   ??? losartan (COZAAR) 50 mg tablet TAKE 1 TABLET BY MOUTH DAILY   ??? pregabalin (LYRICA) 100 mg capsule TAKE 1 CAPSULE BY MOUTH EVERY DAY   ??? polyethylene glycol (MIRALAX) 17 gram/dose powder MIX 17 GRAMS IN 8 OUNCES OF FLUID AND DRINK EVERY DAY AS DIRECTED   ??? polyethylene glycol (MIRALAX)  17 gram/dose powder MIX 17 GRAMS IN 8 OUNCES OF FLUID AND DRINK EVERY DAY AS DIRECTED   ??? mirabegron ER (MYRBETRIQ) 50 mg ER tablet Take 1 Tab by mouth daily.   ??? amLODIPine (NORVASC) 10 mg tablet TAKE 1 TABLET BY MOUTH DAILY FOR HIGH BLOOD PRESSURE   ??? atorvastatin (LIPITOR) 10 mg tablet TAKE 1 TABLET BY MOUTH DAILY   ??? clopidogrel (PLAVIX) 75 mg tab TAKE 1 TABLET BY MOUTH DAILY   ??? metFORMIN (GLUCOPHAGE) 500 mg tablet TAKE 1 TABLET BY MOUTH DAILY WITH BREAKFAST   ??? nizatidine (AXID) 150 mg cap capsule Take 1 Cap by mouth nightly. (Patient taking differently: Take 150 mg by mouth as needed.)   ??? aspirin delayed-release 81 mg tablet Take  by mouth daily.   ??? ascorbic acid (VITAMIN C) 1,000 mg tablet Take  by mouth.   ??? multivitamin with iron (DAILY MULTI-VITAMINS/IRON) tablet Take 1 tablet by mouth daily.   ??? calcium-cholecalciferol, D3, (CALCIUM 600 + D) tablet Take 2 Tabs by mouth daily.     No current facility-administered medications for this visit.        Review of Systems   Constitutional: Negative for chills and fever.   Respiratory: Negative for shortness of breath and wheezing.    Cardiovascular: Negative for chest pain.   Gastrointestinal: Negative for abdominal pain, diarrhea, nausea and vomiting.    Skin: Negative for rash.   Neurological: Positive for tingling. Negative for focal weakness and loss of consciousness.   Psychiatric/Behavioral: Negative for depression, substance abuse and suicidal ideas.       Visit Vitals  BP 173/71 (BP 1 Location: Left arm, BP Patient Position: Sitting)   Pulse 77   Temp 97.4 ??F (36.3 ??C) (Oral)   Resp 16   Ht 4\' 7"  (1.397 m)   Wt 124 lb (56.2 kg)   LMP 10/23/1978   SpO2 97%   BMI 28.82 kg/m??       Physical Exam   Constitutional: She is well-developed, well-nourished, and in no distress. No distress.   In wheelchair   Eyes: Conjunctivae are normal. No scleral icterus.   Cardiovascular: Normal rate, regular rhythm, normal heart sounds and intact distal pulses. Exam reveals no gallop.   No murmur heard.  Pulmonary/Chest: Effort normal and breath sounds normal. No respiratory distress. She has no wheezes.   Abdominal: Soft. Bowel sounds are normal. She exhibits no distension. There is no tenderness.   Lymphadenopathy:     She has no cervical adenopathy.   Neurological: She is alert.   Skin: Skin is warm. She is not diaphoretic.   Psychiatric: Affect normal.   Nursing note and vitals reviewed.       Gait: Normal       Palpation:    L1-L5: Tenderness    Sacrum: No Tenderness    Coccyx: No Tenderness    Paraspinal:   Left: Tenderness Right: No Tenderness    Sacroiliac Joint:  Left:  Tenderness Right: No Tenderness      Strength (0-5/5)    Hip Flexion:   Left: 5/5  Right: 5/5    Hip Extension:   Left: 5/5  Right: 5/5    Hip Abduction:  Left: 5/5  Right: 5/5    Hip Adduction:   Left: 5/5  Right: 5/5    Knee Extension:  Left: 5/5  Right: 5/5    Knee Flexion:   Left: 5/5  Right: 5/5    Ankle dorsiflexion:  Left: 5/5  Right: 5/5    Ankle plantarflexion:  Left: 5/5  Right: 5/5    Great toe extension:  Left: 5/5  Right: 5/5     Sensation: L4-S1 intact, no deficits noted     DTR:    Patella:  Left: +2  Right: +2    Achilles:  Left: +2  Right: +2                 Recent Results (from the past 12 hour(s))   AMB POC URINALYSIS DIP STICK AUTO W/O MICRO    Collection Time: 11/15/17  1:54 PM   Result Value Ref Range    Color (UA POC) Yellow     Clarity (UA POC) Clear     Glucose (UA POC) 1+ Negative    Bilirubin (UA POC) Negative Negative    Ketones (UA POC) Negative Negative    Specific gravity (UA POC) 1.015 1.001 - 1.035    Blood (UA POC) Negative Negative    pH (UA POC) 5.5 4.6 - 8.0    Protein (UA POC) Negative Negative    Urobilinogen (UA POC) 0.2 mg/dL 0.2 - 1    Nitrites (UA POC) Negative Negative    Leukocyte esterase (UA POC) Negative Negative       1. Other osteoporosis without current pathological fracture  DEXA results with patient.  Discussed with patient and daughter risk and benefits of bisphosphonates.  He would like to be some more research if she would like to start this medication.    2. Dysuria  Urinalysis negative.  Likely some vaginal dryness.  Is not drinking enough fluids.  Encouraged to drink at least 1.5 L a day of water    3. Burning with urination  - AMB POC URINALYSIS DIP STICK AUTO W/O MICRO    4. Neuropathy  No radicular signs on exam today.  Assess labs below.  - VITAMIN B12  - TSH RFX ON ABNORMAL TO FREE T4    5. Acute left-sided low back pain without sciatica  We will get x-ray to assess for compression fracture  - XR SPINE LUMB MIN 4 V; Future    6. Hypertension, unspecified type  Elevated today.  We will increase Cozaar.  Follow-up in 2 weeks  - losartan (COZAAR) 100 mg tablet; Take 1 Tab by mouth daily.  Dispense: 30 Tab; Refill: 1    Patient encounter was at least 25 minutes with more than 50 percent of the visit involved in counseling      Follow-up Disposition:  Return in about 2 weeks (around 11/29/2017) for Discuss meds for osteoporosis and HTN.    Salley Scarletimothy Jhaniya Briski, MD, CAQSM, RMSK

## 2017-11-15 NOTE — Progress Notes (Signed)
B12 very high.  Will discuss this at next visit

## 2017-11-15 NOTE — Progress Notes (Signed)
Chief Complaint   Patient presents with   ??? Labs     follow up   ??? Urinary Burning     x 2 weeks      Blood pressure 168/74, pulse 93, temperature 97.4 ??F (36.3 ??C), temperature source Oral, resp. rate 16, height 4\' 7"  (1.397 m), weight 124 lb (56.2 kg), last menstrual period 10/23/1978, SpO2 97 %.    1. Have you been to the ER, urgent care clinic since your last visit?  Hospitalized since your last visit?No    2. Have you seen or consulted any other health care providers outside of the [facility] since your last visit?  Include any pap smears or colon screening. No

## 2017-11-16 LAB — VITAMIN B12: Vitamin B12: 757 pg/mL (ref 232–1245)

## 2017-11-16 LAB — TSH RFX ON ABNORMAL TO FREE T4: TSH: 0.733 u[IU]/mL (ref 0.450–4.500)

## 2017-11-16 MED ORDER — LOSARTAN 100 MG TAB
100 mg | ORAL_TABLET | ORAL | 1 refills | Status: DC
Start: 2017-11-16 — End: 2018-01-28

## 2017-11-20 NOTE — Telephone Encounter (Signed)
Requested Prescriptions     Pending Prescriptions Disp Refills   ??? polyethylene glycol (MIRALAX) 17 gram/dose powder 527 g 0

## 2017-11-22 MED ORDER — POLYETHYLENE GLYCOL 3350 100 % ORAL POWDER
17 gram/dose | ORAL | 0 refills | Status: DC
Start: 2017-11-22 — End: 2018-07-06

## 2017-11-26 NOTE — Telephone Encounter (Signed)
Spoke to Chelsea Mary JaneMary with [employer] regarding prior authorization required for Miralax.  Requested if prior authorization form can  can be faxed to office for completion. Chelsea Schultz states prior authorization  form will be faxed as requested.

## 2017-11-29 NOTE — Telephone Encounter (Signed)
Patient daughter Jola Babinski([name]) on [employer] calling, notes that insurance company is requesting Auth for Miralax.    Daughter ph [phone]    thanks

## 2017-11-29 NOTE — Telephone Encounter (Signed)
Prior authorization form for Miralax has been completed by Dr. Cathie HoopsYu.  Prior authorization form faxed today (11/29/17). Confirmation faxed recieved.

## 2017-12-02 NOTE — Telephone Encounter (Signed)
Fax received stating prior authorization for Miralax has been denied due to it not covered by insurance. Notified daughter [name] on [employer] of status of prior authorization. Daughter [name] verbalized understanding.

## 2017-12-03 ENCOUNTER — Ambulatory Visit: Admit: 2017-12-03 | Discharge: 2017-12-03 | Payer: MEDICARE | Attending: Family Medicine | Primary: Family Medicine

## 2017-12-03 DIAGNOSIS — M545 Low back pain, unspecified: Secondary | ICD-10-CM

## 2017-12-03 LAB — AMB POC URINALYSIS DIP STICK AUTO W/O MICRO
Bilirubin (UA POC): NEGATIVE
Blood (UA POC): NEGATIVE
Glucose (UA POC): NEGATIVE
Leukocyte esterase (UA POC): NEGATIVE
Nitrites (UA POC): NEGATIVE
Protein (UA POC): NEGATIVE
Specific gravity (UA POC): 1.02 (ref 1.001–1.035)
Urobilinogen (UA POC): 0.2 (ref 0.2–1)
pH (UA POC): 6 (ref 4.6–8.0)

## 2017-12-03 NOTE — Progress Notes (Signed)
Chief Complaint   Patient presents with   ??? Urinary Burning     x 7 days   ??? Urinary Frequency   ??? Medication Evaluation     Blood pressure     1. Have you been to the ER, urgent care clinic since your last visit?  Hospitalized since your last visit?No    2. Have you seen or consulted any other health care providers outside of the [facility] since your last visit?  Include any pap smears or colon screening. No  Visit Vitals  BP 166/68 (BP 1 Location: Right arm, BP Patient Position: Sitting)   Pulse 82   Temp 97.7 ??F (36.5 ??C) (Oral)   Resp 18   Ht 4\' 7"  (1.397 m)   SpO2 97%   BMI 28.82 kg/m??

## 2017-12-03 NOTE — Patient Instructions (Signed)
Alendronate (By mouth)   Alendronate (a-LEN-droe-nate)  Treats or prevents osteoporosis. Also treats Paget disease of the bone.   Brand Name(s): Binosto, Fosamax   There may be other brand names for this medicine.  When This Medicine Should Not Be Used:   This medicine is not right for everyone. Do not use it if you had an allergic reaction to alendronate, or if you have esophagus problems or trouble swallowing. Do not use it if you cannot stand or sit upright for at least 30 minutes after you take the medicine.  How to Use This Medicine:   Liquid, Tablet, Fizzy Tablet  ?? Take this medicine in the morning on an empty stomach. Follow the directions exactly to lower the risk of esophagus problems.  ?? Sit or stand while you take this medicine. Do not lie down for at least 30 minutes after you take the medicine, and do not lie down until after you have eaten.  ?? Use plain water to take your medicine. The medicine may not work as well if you use other liquids.  ?? Tablet: Swallow whole with 6 to 8 ounces of water. Do not chew or suck on the tablet.  ?? Oral liquid: Measure the oral liquid medicine with a marked measuring spoon, oral syringe, or medicine cup. Drink at least 2 ounces (1/4 cup) of water after you take the liquid medicine.  ?? Effervescent tablet: Dissolve the tablet in 4 ounces of room temperature water. Wait at least 5 minutes after the bubbling stops. Then stir the solution for 10 seconds and drink it.  ?? Wait at least 30 minutes after you take this medicine before you eat or drink or take any other medicine. This will help your body absorb the medicine.  ?? This medicine should come with a Medication Guide. Ask your pharmacist for a copy if you do not have one.  ?? Missed dose: Take a dose the next morning. Do not take 2 tablets on the same day. Then go back to your regular schedule.  ?? Store the medicine in a closed container at room temperature, away from  heat, moisture, and direct light. Keep the effervescent tablets in the blister pack until you are ready to use them.  Drugs and Foods to Avoid:   Ask your doctor or pharmacist before using any other medicine, including over-the-counter medicines, vitamins, and herbal products.  ?? Some medicines can affect how alendronate works. Tell your doctor if you are using any of the following:   ?? Cancer medicines  ?? NSAID pain or arthritis medicine (including aspirin, celecoxib, diclofenac, ibuprofen, naproxen)  ?? Steroid medicine (including as hydrocortisone, methylprednisolone, prednisone, prednisolone, dexamethasone)  ?? Take alendronate at least 30 minutes before you take any other oral medicine, including aluminum, magnesium, iron, or calcium supplements, or antacids.  Warnings While Using This Medicine:   ?? Tell your doctor if you are pregnant or breastfeeding, or if you have kidney disease, heartburn, anemia, blood clotting problems, ulcers or other stomach or bowel problems, a vitamin D deficiency, or a history of cancer. Tell your doctor if you have dental problems or if you wear dentures. Also tell your doctor if you smoke or drink alcohol.  ?? This medicine may cause the following problems:   ?? Damage to your esophagus  ?? Increased risk for a thigh bone fracture  ?? Low calcium levels  ?? Tell any doctor or dentist who treats you that you are using this medicine. This medicine may   cause jaw problems, especially if you have a tooth pulled or other dental work.  ?? The effervescent tablet contains sodium. Tell your doctor if you are on a low-salt diet.  ?? Your doctor will check your progress and the effects of this medicine at regular visits. Keep all appointments.  ?? Keep all medicine out of the reach of children. Never share your medicine with anyone.  Possible Side Effects While Using This Medicine:   Call your doctor right away if you notice any of these side effects:   ?? Allergic reaction: Itching or hives, swelling in your face or hands, swelling or tingling in your mouth or throat, chest tightness, trouble breathing  ?? Blistering, peeling, red skin rash  ?? Chest pain, new or worsening heartburn, or a burning feeling in your throat  ?? Muscle spasms or twitching, tingling or numbness in your fingers, toes, or around your mouth  ?? Pain or difficulty when swallowing  ?? Pain, swelling, numbness, or a heavy feeling in your mouth or jaw, loose teeth, or other tooth problems  ?? Severe bone, joint, or muscle pain  ?? Unusual pain in your thigh, groin, or hip  If you notice these less serious side effects, talk with your doctor:   ?? Mild stomach pain or upset  If you notice other side effects that you think are caused by this medicine, tell your doctor.   Call your doctor for medical advice about side effects. You may report side effects to FDA at 1-800-FDA-1088  ?? 2017 [facility] Information is for End User's use only and may not be sold, redistributed or otherwise used for commercial purposes.  The above information is an educational aid only. It is not intended as medical advice for individual conditions or treatments. Talk to your doctor, nurse or pharmacist before following any medical regimen to see if it is safe and effective for you.       Deciding About Bisphosphonate Medicine for Osteoporosis  How can you decide about taking bisphosphonate medicine for osteoporosis?  What is osteoporosis?  Osteoporosis is a disease that affects your bones. It means you have bones that are thin and brittle and have lots of holes inside them like a sponge. This makes them easy to break. It also increases your risk for spine and hip fractures. These fractures may make it hard for you to live on your own.  Bisphosphonates are the most common medicines used to prevent bone loss. Most of the time, you take them as pills. They slow the way bone dissolves  and is absorbed by your body. They can increase bone thickness and strength.  What are key points about this decision?  ?? If you have osteoporosis, these medicines can increase bone thickness. And they can lower your risk of spine and hip fractures. You may also want to think about taking them if you have low bone density (sometimes called osteopenia) or risk factors for osteoporosis.  ?? These medicines can have side effects, such as heartburn and belly pain. They also can cause headaches. Their long-term effects are not known.  ?? Whether you take medicine or not, healthy habits can also help protect your bones. Talk to your doctor about taking calcium and vitamin D supplements. Get regular weight-bearing exercise. Cut back on alcohol. And if you smoke, quit.  Why might you choose to take bisphosphonate medicines?  ?? You have bone loss that is not normal for your age.  ?? You have  osteoporosis or low bone density. Or you have risk factors for osteoporosis.  ?? You have been taking hormone therapy (HT) and stopped. Bone loss medicines can protect against rapid bone loss after you quit HT.  ?? You do not mind taking pills. Or you do not mind getting medicines through a tube in your vein, called an IV.  ?? You think the benefits of taking these medicines outweigh the side effects.  Why might you choose not to take these medicines?  ?? You want to try other medicines that help prevent bone loss. These include calcitonin (Calcimar or Miacalcin), denosumab (Prolia or Xgeva), hormone therapy, raloxifene (Evista), and teriparatide (Forteo).  ?? You want to try healthy habits to prevent bone fractures.  ?? You do not like the idea of taking pills. Or you do not like getting medicines through a tube in your vein, called an IV.  ?? You are worried about the side effects of these medicines.  Your decision  Thinking about the facts and your feelings can help you make a decision  that is right for you. Be sure you understand the benefits and risks of your options, and think about what else you need to do before you make the decision.  Where can you learn more?  Go to InsuranceStats.cahttp://www.healthwise.net/GoodHelpConnections.  Enter [id number] [id number] in the search box to learn more about "Deciding About Bisphosphonate Medicine for Osteoporosis."  Current as of: January 04, 2017  Content Version: 11.9  ?? 2006-2018 [employer], [employer]. Care instructions adapted under license by [employer] (which disclaims liability or warranty for this information). If you have questions about a medical condition or this instruction, always ask your healthcare professional. Health[employer], [employer] disclaims any warranty or liability for your use of this information.

## 2017-12-03 NOTE — Progress Notes (Signed)
Chelsea Schultz is a 84 y.o. female here today to address the following issues:  Chief Complaint   Patient presents with   ??? Urinary Burning     x 7 days   ??? Urinary Frequency   ??? Medication Evaluation     Blood pressure     Patient noted to be osteoporotic on her DEXA.  She is unsure about medications at this time.  X-ray reviewed with patient.    Blood pressures at home has been 130-140s/70s.  Checking daily.      Does complain of chronic back pain.  No bowel or bladder incontinence.  Also complains of some vaginal dryness and some burning when she urinates.  Urinalysis last visit was normal.  Denies any urgency or frequency.    Past Medical History:   Diagnosis Date   ??? Arthritis    ??? CVA (cerebral infarction)    ??? Diabetes (HCC) 01/09/2015    Hemoglobin A1C 6.4 (per records, done at outside facility)   ??? GERD (gastroesophageal reflux disease)    ??? History of recurrent UTIs     on chronic antibiotic prophylaxis, followed by urology   ??? HLD (hyperlipidemia) 01/09/2015    Total Chol 141, Tri 49, HDL 59, LDL 72   ??? Hypertension    ??? Neuropathy    ??? Osteopetrosis    ??? Spinal stenosis      Past Surgical History:   Procedure Laterality Date   ??? HX ORTHOPAEDIC      Lumbar laminectomy   ??? HX ORTHOPAEDIC      Lumbar fusion     Social History     Socioeconomic History   ??? Marital status: WIDOWED     Spouse name: Not on file   ??? Number of children: Not on file   ??? Years of education: Not on file   ??? Highest education level: Not on file   Social Needs   ??? Financial resource strain: Not on file   ??? Food insecurity - worry: Not on file   ??? Food insecurity - inability: Not on file   ??? Transportation needs - medical: Not on file   ??? Transportation needs - non-medical: Not on file   Occupational History   ??? Not on file   Tobacco Use   ??? Smoking status: Never Smoker   ??? Smokeless tobacco: Never Used   Substance and Sexual Activity   ??? Alcohol use: No   ??? Drug use: No   ??? Sexual activity: No   Other Topics Concern    ??? Not on file   Social History Narrative   ??? Not on file       No Known Allergies    Current Outpatient Medications   Medication Sig   ??? losartan (COZAAR) 100 mg tablet TAKE 1 TABLET BY MOUTH DAILY   ??? pregabalin (LYRICA) 100 mg capsule TAKE 1 CAPSULE BY MOUTH EVERY DAY   ??? polyethylene glycol (MIRALAX) 17 gram/dose powder MIX 17 GRAMS IN 8 OUNCES OF FLUID AND DRINK EVERY DAY AS DIRECTED   ??? amLODIPine (NORVASC) 10 mg tablet TAKE 1 TABLET BY MOUTH DAILY FOR HIGH BLOOD PRESSURE   ??? atorvastatin (LIPITOR) 10 mg tablet TAKE 1 TABLET BY MOUTH DAILY   ??? clopidogrel (PLAVIX) 75 mg tab TAKE 1 TABLET BY MOUTH DAILY   ??? metFORMIN (GLUCOPHAGE) 500 mg tablet TAKE 1 TABLET BY MOUTH DAILY WITH BREAKFAST   ??? nizatidine (AXID) 150 mg cap capsule Take 1 Cap  by mouth nightly. (Patient taking differently: Take 150 mg by mouth as needed.)   ??? aspirin delayed-release 81 mg tablet Take  by mouth daily.   ??? ascorbic acid (VITAMIN C) 1,000 mg tablet Take  by mouth.   ??? multivitamin with iron (DAILY MULTI-VITAMINS/IRON) tablet Take 1 tablet by mouth daily.   ??? calcium-cholecalciferol, D3, (CALCIUM 600 + D) tablet Take 2 Tabs by mouth daily.   ??? polyethylene glycol (MIRALAX) 17 gram/dose powder Use as needed or every other day   ??? losartan (COZAAR) 50 mg tablet TAKE 1 TABLET BY MOUTH DAILY   ??? mirabegron ER (MYRBETRIQ) 50 mg ER tablet Take 1 Tab by mouth daily.     No current facility-administered medications for this visit.        Review of Systems   Constitutional: Negative for chills and fever.   Respiratory: Negative for sputum production, shortness of breath and wheezing.    Cardiovascular: Negative for chest pain and palpitations.   Gastrointestinal: Negative for abdominal pain, diarrhea, nausea and vomiting.   Musculoskeletal: Positive for back pain.     A comprehensive review of systems was negative except for that written in the HPI and listed above.       Visit Vitals   BP 166/68 (BP 1 Location: Right arm, BP Patient Position: Sitting)   Pulse 82   Temp 97.7 ??F (36.5 ??C) (Oral)   Resp 18   Ht 4\' 7"  (1.397 m)   LMP 10/23/1978   SpO2 97%   BMI 28.82 kg/m??       Physical Exam   Constitutional: No distress.   Sitting in wheelchair   Eyes: Conjunctivae are normal. No scleral icterus.   Cardiovascular: Normal rate, regular rhythm, normal heart sounds and intact distal pulses. Exam reveals no gallop.   No murmur heard.  Pulmonary/Chest: Effort normal and breath sounds normal. No respiratory distress. She has no wheezes.   Abdominal: Soft. Bowel sounds are normal. She exhibits no distension. There is no tenderness.   Lymphadenopathy:     She has no cervical adenopathy.   Neurological: She is alert.   Skin: Skin is warm. She is not diaphoretic.   Psychiatric: Affect normal.   Nursing note and vitals reviewed.      Recent Results (from the past 12 hour(s))   AMB POC URINALYSIS DIP STICK AUTO W/O MICRO    Collection Time: 12/03/17  5:25 PM   Result Value Ref Range    Color (UA POC) Amber     Clarity (UA POC) Clear     Glucose (UA POC) Negative Negative    Bilirubin (UA POC) Negative Negative    Ketones (UA POC) Trace Negative    Specific gravity (UA POC) 1.020 1.001 - 1.035    Blood (UA POC) Negative Negative    pH (UA POC) 6.0 4.6 - 8.0    Protein (UA POC) Negative Negative    Urobilinogen (UA POC) 0.2 mg/dL 0.2 - 1    Nitrites (UA POC) Negative Negative    Leukocyte esterase (UA POC) Negative Negative       1. Frequent urination  UA negative  - AMB POC URINALYSIS DIP STICK AUTO W/O MICRO    2. Chronic bilateral low back pain without sciatica  Will refer to physical therapy.  Discussed in detail the patient is a fall risk and general strengthening and rehabilitation will be helpful.  Does not want any injections at this time  - REFERRAL TO PHYSICAL THERAPY  3. Vaginal dryness  Can try Replens.  States that she cannot use estrogen or topical estrogen because she has a history of stroke.     4. Age-related osteoporosis without current pathological fracture  Unsure about starting bisphosphonates today.  Information given and she will return to rediscuss this in about a month.    5.  Hypertension  Blood pressure well controlled at home.  We will continue to monitor this and follow-up in a month and bring log for me to review.  We will continue with current regimen for now.      Follow-up Disposition:  Return in about 1 month (around 12/31/2017) for Blood pressure .    Patient encounter was at least 25 minutes with more than 50 percent of the visit involved in counseling      Salley Scarlet, MD, Marrianne Mood, RMSK

## 2017-12-04 NOTE — Telephone Encounter (Signed)
Pt daughter came in stated that per medicare requirements the prescription for diabetic shoes and walker but be on written separately. On the prescription it must also say "diabetics w/ complications a diagnosis code(ICD-10) and the MD's NPI"   patient would like to pick it up or have a family member on her HIPAA do so.    thankyou    Returned original prescription and left a note with it. It's located in the team folder

## 2017-12-04 NOTE — Telephone Encounter (Signed)
Prescriptions corrected. I tried to call patient to make them aware of scripts. Voicemail box is full. If patient calls back please let her know that they are ready for pickup.

## 2017-12-26 ENCOUNTER — Telehealth

## 2017-12-26 NOTE — Telephone Encounter (Addendum)
error 

## 2017-12-26 NOTE — Telephone Encounter (Signed)
-----   Message from Edmund HildaFranka Logan sent at 12/26/2017  6:26 PM EST -----  Regarding: Dr Gloris ManchesterYu/Telephone   Cathy Slomski-Sledge, daughter, is following up on the status of form to be filled out [facility]    Best contact # [phone]

## 2017-12-26 NOTE — Telephone Encounter (Signed)
-----   Message from Franka Logan sent at 12/26/2017  6:26 PM EST -----  Regarding: Dr Yu/Telephone   Cathy Mackiewicz-Sledge, daughter, is following up on the status of form to be filled out for [facility]    Best contact # [phone]

## 2017-12-27 NOTE — Telephone Encounter (Signed)
This form is in Dr. Bethanne GingerYu's folder pending competition.

## 2017-12-28 NOTE — Telephone Encounter (Signed)
Spoke with daughter [name] on hippa to notify form for   [facility] has been completed by Dr. Cathie HoopsYu. Notified daughter that form is requesting the most recent labs including a CBC .  Notified daughter that Dr. Cathie HoopsYu has ordered a CBC lab for patient to have done. Advised daughter patient can schedule a lab appointment. Notified daughter that form will be placed at front desk for pick up. Daughter [name] verbalized understanding.

## 2017-12-28 NOTE — Telephone Encounter (Signed)
Last CBC showing leukocytosis.  Will recheck CBC

## 2018-01-02 NOTE — Telephone Encounter (Signed)
Patient's daughter, on HIPAA came in requesting to pick up forms however it looks like lab results are needed in order to complete this form and I do see that the lab order has been placed. Patient is coming in tomorrow afternoon to have these labs drawn, patient is requesting this form to be faxed as soon as possible. Please advise, thank you!

## 2018-01-03 ENCOUNTER — Inpatient Hospital Stay: Admit: 2018-02-21 | Payer: MEDICARE | Primary: Family Medicine

## 2018-01-03 ENCOUNTER — Ambulatory Visit: Admit: 2018-01-03 | Discharge: 2018-01-03 | Payer: MEDICARE | Attending: Family Medicine | Primary: Family Medicine

## 2018-01-03 ENCOUNTER — Encounter: Admit: 2018-01-03 | Primary: Family Medicine

## 2018-01-03 DIAGNOSIS — G8929 Other chronic pain: Secondary | ICD-10-CM

## 2018-01-03 DIAGNOSIS — D72829 Elevated white blood cell count, unspecified: Secondary | ICD-10-CM

## 2018-01-03 NOTE — Progress Notes (Signed)
Chief Complaint   Patient presents with   ??? Spasms

## 2018-01-03 NOTE — Progress Notes (Signed)
[facility] Attending Addendum:  I saw and evaluated the patient on the day of the encounter with Yasir Abdul-Rahman, DO  , performing the key elements of the service.  I discussed the findings, assessment and plan with the resident and agree with the resident's findings and plan as documented in the resident's note.      Dorotha Hirschi, MD, CAQSM, RMSK

## 2018-01-03 NOTE — Progress Notes (Signed)
HPI:  Chelsea Schultz is a 84 y.o. female who presents with right knee spasm. Comes in with daughter    Pt states that when she gets up from seated position at times and places weight on the right leg, she feels as if he knee has a spasm and gives out of her. Has not fallen because of this. No trauma. States this has occurred 2 times over the last month. Does not do daily stretching or exercises. No swelling in legs or calf pain. No knee pain, spasm, or weakness in the knees today. No new medications attempted.  Past Medical History:   Diagnosis Date   ??? Arthritis    ??? CVA (cerebral infarction)    ??? Diabetes (HCC) 01/09/2015    Hemoglobin A1C 6.4 (per records, done at outside facility)   ??? GERD (gastroesophageal reflux disease)    ??? History of recurrent UTIs     on chronic antibiotic prophylaxis, followed by urology   ??? HLD (hyperlipidemia) 01/09/2015    Total Chol 141, Tri 49, HDL 59, LDL 72   ??? Hypertension    ??? Neuropathy    ??? Osteopetrosis    ??? Spinal stenosis      Current Outpatient Medications:   ???  polyethylene glycol (MIRALAX) 17 gram/dose powder, Use as needed or every other day, Disp: 527 g, Rfl: 0  ???  losartan (COZAAR) 100 mg tablet, TAKE 1 TABLET BY MOUTH DAILY, Disp: 90 Tab, Rfl: 1  ???  losartan (COZAAR) 50 mg tablet, TAKE 1 TABLET BY MOUTH DAILY, Disp: 90 Tab, Rfl: 3  ???  pregabalin (LYRICA) 100 mg capsule, TAKE 1 CAPSULE BY MOUTH EVERY DAY, Disp: 90 Cap, Rfl: 3  ???  polyethylene glycol (MIRALAX) 17 gram/dose powder, MIX 17 GRAMS IN 8 OUNCES OF FLUID AND DRINK EVERY DAY AS DIRECTED, Disp: 527 g, Rfl: 0  ???  mirabegron ER (MYRBETRIQ) 50 mg ER tablet, Take 1 Tab by mouth daily., Disp: 90 Tab, Rfl: 3  ???  amLODIPine (NORVASC) 10 mg tablet, TAKE 1 TABLET BY MOUTH DAILY FOR HIGH BLOOD PRESSURE, Disp: 90 Tab, Rfl: 3  ???  atorvastatin (LIPITOR) 10 mg tablet, TAKE 1 TABLET BY MOUTH DAILY, Disp: 90 Tab, Rfl: 3  ???  clopidogrel (PLAVIX) 75 mg tab, TAKE 1 TABLET BY MOUTH DAILY, Disp: 90 Tab, Rfl: 3   ???  metFORMIN (GLUCOPHAGE) 500 mg tablet, TAKE 1 TABLET BY MOUTH DAILY WITH BREAKFAST, Disp: 90 Tab, Rfl: 3  ???  nizatidine (AXID) 150 mg cap capsule, Take 1 Cap by mouth nightly. (Patient taking differently: Take 150 mg by mouth as needed.), Disp: 90 Cap, Rfl: 1  ???  aspirin delayed-release 81 mg tablet, Take  by mouth daily., Disp: , Rfl:   ???  ascorbic acid (VITAMIN C) 1,000 mg tablet, Take  by mouth., Disp: , Rfl:   ???  multivitamin with iron (DAILY MULTI-VITAMINS/IRON) tablet, Take 1 tablet by mouth daily., Disp: , Rfl:   ???  calcium-cholecalciferol, D3, (CALCIUM 600 + D) tablet, Take 2 Tabs by mouth daily., Disp: 60 Tab, Rfl: 12  No Known Allergies  Past Medical History:   Diagnosis Date   ??? Arthritis    ??? CVA (cerebral infarction)    ??? Diabetes (HCC) 01/09/2015    Hemoglobin A1C 6.4 (per records, done at outside facility)   ??? GERD (gastroesophageal reflux disease)    ??? History of recurrent UTIs     on chronic antibiotic prophylaxis, followed by urology   ???  HLD (hyperlipidemia) 01/09/2015    Total Chol 141, Tri 49, HDL 59, LDL 72   ??? Hypertension    ??? Neuropathy    ??? Osteopetrosis    ??? Spinal stenosis      Family History   Problem Relation Age of Onset   ??? Diabetes Mother    ??? Stroke Mother    ??? Hypertension Mother    ??? Hypertension Father    ??? Stroke Father    ??? Cancer Sister    ??? Stroke Sister      ROS:   No fever or chills  No CP or SOB  No numbness or weakness  No edema or calf pain  No loss of bladder/bowl control.  Eating and drinking without issue  No falls    Objective:   Visit Vitals  BP 145/77   Pulse 84   Temp 98 ??F (36.7 ??C)   Wt 125 lb (56.7 kg)   LMP 10/23/1978   SpO2 98%   BMI 29.05 kg/m??     Gen: Well appearing.  No apparent distress. In wheelchair. Alert and oriented.  Responds to all questions appropriately.  Lungs: No labored respirations.  Talking in complete sentences without difficulty.  Musculoskeletal:  Knee: right  Knee Effusion: None  Quadriceps atrophy: None    Popliteal (Baker???s) Cyst: Negative   Patellofemoral crepitus: Positive  ROM intact, no limited    Dynamic Test:  Gait: unsteady  Assistive devices: wheelchair  One leg squat: unable to perform    Palpation:   Patella tenderness: None  Patellar tendon tenderness: None  Quad tendon tenderness: None  Medial joint line tenderness: None  Lateral joint line tenderness: None  MCL tenderness: None  LCL tenderness: None  Medial facet tenderness: None  Lateral facet tenderness: None  Condyle tenderness: None  Tibia tubercle tenderness: None  Proximal fibula tenderness: None  Prepatellar bursa tenderness: None  ITB tenderness: None    Ligament/Meniscal Exam:  Patellar Grind: Negative   Patellar apprehension (medial/lateral): Negative   Lochman: Negative, with good endpoint   Anterior Drawer: Negative   Posterior Drawer: Negative   Valgus stress: Negative with good endpoint   Varus stress: Negative with good endpoint   McMurray: Negative     Strength (0-5/5):   Flexion: Left: 4/5 (hx of stroke)    Right: 5/5    Extension: Left: 4/5 (hx of stroke)  Right: 5/5    Hip abduction: 5/5    Hip adduction: 5/5      Neuro/Vascular : Pulses intact, no edema, and neurologically intact .  Skin: No obvious rash or skin breakdown.    Personally review:    Right knee xray-  INDICATION: right knee.  ??  COMPARISON: None.  ??  FINDINGS: Three views of the right knee demonstrate severe lateral compartment  osteoarthritis and mild patellofemoral compartment osteoarthritis. No fracture,  dislocation, or joint effusion.  ??  IMPRESSION: Severe lateral compartment osteoarthritis.        ASSESSMENT:    ICD-10-CM ICD-9-CM    1. Chronic pain of right knee M25.561 719.46 XR KNEE RT 3 V    G89.29 338.29 REFERRAL TO HOME HEALTH   2. Osteoarthritis of right knee, unspecified osteoarthritis type M17.11 715.96        PLAN: Offered injection or OP PT. Pt does not want steroid injection.     1. Home Exercise Program as per handout. Will have HH PT evaluate and  treat given pt not willing to go to OP  PT. [facility]  2. Ice 15 minutes, three times a day PRN and after exercise.  Can alternate with heat for 15 minutes.     Medications:    1. Acetaminophen (Tylenol):  500mg  1-2 tablets every 6 hours as needed for pain.    RTC: 3 weeks    Discussed with attending    Fidela SalisburyYasir Abdul-Rahman, DO

## 2018-01-04 LAB — CBC WITH AUTOMATED DIFF
ABS. BASOPHILS: 0 10*3/uL (ref 0.0–0.2)
ABS. EOSINOPHILS: 0.2 10*3/uL (ref 0.0–0.4)
ABS. IMM. GRANS.: 0 10*3/uL (ref 0.0–0.1)
ABS. MONOCYTES: 1.3 10*3/uL — ABNORMAL HIGH (ref 0.1–0.9)
ABS. NEUTROPHILS: 4.9 10*3/uL (ref 1.4–7.0)
Abs Lymphocytes: 2.3 10*3/uL (ref 0.7–3.1)
BASOPHILS: 1 %
EOSINOPHILS: 3 %
HCT: 39.8 % (ref 34.0–46.6)
HGB: 12.3 g/dL (ref 11.1–15.9)
IMMATURE GRANULOCYTES: 0 %
Lymphocytes: 26 %
MCH: 27.2 pg (ref 26.6–33.0)
MCHC: 30.9 g/dL — ABNORMAL LOW (ref 31.5–35.7)
MCV: 88 fL (ref 79–97)
MONOCYTES: 15 %
NEUTROPHILS: 55 %
PLATELET: 271 10*3/uL (ref 150–379)
RBC: 4.52 x10E6/uL (ref 3.77–5.28)
RDW: 13.9 % (ref 12.3–15.4)
WBC: 8.7 10*3/uL (ref 3.4–10.8)

## 2018-01-04 NOTE — Telephone Encounter (Signed)
[facility]aisy with [facility] calling and asking that Minnie/referrals give her a call. She mentioned that referral not matching diagnosis. She said that Dr. Bethanne GingerYu's notes are good, however mentioned that he mentions back pain. She said Dr. Cline CoolsAbdul-Rahman mentions rt knee. Asking which is correct as in order for medicare to pay notes must match diagnosis.    Call [phone]

## 2018-01-04 NOTE — Telephone Encounter (Signed)
Toney ReilDaisy of [facility]  Ph [phone]    Called on the order of Dr. Cathie HoopsYu.  They can see the one of Dr Cline CoolsAbdul-Rahman.

## 2018-01-07 ENCOUNTER — Encounter: Primary: Family Medicine

## 2018-01-07 NOTE — Telephone Encounter (Signed)
[name] calling and states that they will see patient on tomorrow for PT Instead of today and asking for a verbal order.     Nurse Florene GlenLauren C assisted

## 2018-01-07 NOTE — Telephone Encounter (Signed)
Call was taken by Dr. Cathie HoopsYU

## 2018-01-08 ENCOUNTER — Encounter: Primary: Family Medicine

## 2018-01-08 NOTE — Telephone Encounter (Signed)
Elmarie Shileyiffany, [name]    Need a new verbal for home care to start on March 20 as patient again of today did not wish to start home care.  Ph---[phone]

## 2018-01-08 NOTE — Telephone Encounter (Addendum)
No message.

## 2018-01-09 ENCOUNTER — Encounter: Admit: 2018-01-09 | Discharge: 2018-01-09 | Payer: MEDICARE | Primary: Family Medicine

## 2018-01-09 NOTE — Telephone Encounter (Signed)
Requested labs with form completed by Dr. Cathie HoopsYu [facility]ed [facility] (01/09/18).   Confirmation fax received (01/10/28).

## 2018-01-14 ENCOUNTER — Encounter: Admit: 2018-01-14 | Discharge: 2018-01-14 | Payer: MEDICARE | Primary: Family Medicine

## 2018-01-16 ENCOUNTER — Encounter: Admit: 2018-01-16 | Discharge: 2018-01-16 | Payer: MEDICARE | Primary: Family Medicine

## 2018-01-20 NOTE — Telephone Encounter (Signed)
Received a fax refill request for Candesartan 32MG  tabs Qty: 90 one a day.  Can not find in chart.

## 2018-01-22 ENCOUNTER — Encounter: Admit: 2018-01-22 | Discharge: 2018-01-22 | Payer: MEDICARE | Primary: Family Medicine

## 2018-01-23 ENCOUNTER — Encounter: Admit: 2018-01-23 | Discharge: 2018-01-23 | Payer: MEDICARE | Primary: Family Medicine

## 2018-01-23 NOTE — Telephone Encounter (Signed)
Notified pharmacist Herbert SetaHeather with [facility] pharmacy medication for Candesartan has bee discontinued. Pharmacist verbalized understanding.

## 2018-01-24 ENCOUNTER — Encounter: Primary: Family Medicine

## 2018-01-28 ENCOUNTER — Encounter

## 2018-01-28 MED ORDER — METFORMIN 500 MG TAB
500 mg | ORAL_TABLET | ORAL | 3 refills | Status: DC
Start: 2018-01-28 — End: 2018-04-22

## 2018-01-28 MED ORDER — LOSARTAN 100 MG TAB
100 mg | ORAL_TABLET | ORAL | 1 refills | Status: DC
Start: 2018-01-28 — End: 2018-08-25

## 2018-01-28 NOTE — Telephone Encounter (Signed)
LOV - 12/03/17

## 2018-01-28 NOTE — Telephone Encounter (Signed)
Ivin BootyJoshua from [employer] calling to see if we received a fax for the patients diabetic testing supplies. I informed him we have not received it. He will refax.

## 2018-01-29 ENCOUNTER — Encounter: Admit: 2018-01-29 | Discharge: 2018-01-29 | Payer: MEDICARE | Primary: Family Medicine

## 2018-01-29 NOTE — Telephone Encounter (Signed)
Michiel SitesMarilyn Waddell calling for status, she states the patient is out of her supplies and they need this addressed asap.     Please call Michiel SitesMarilyn Waddell at [phone] with status.

## 2018-01-29 NOTE — Telephone Encounter (Signed)
Form received and completed by Dr. Cathie HoopsYu.  [employer] form for diabetic supples faxed back to [employer].   Confirmation faxed received (01/29/18).

## 2018-01-30 ENCOUNTER — Encounter: Primary: Family Medicine

## 2018-01-31 ENCOUNTER — Encounter: Primary: Family Medicine

## 2018-02-01 ENCOUNTER — Encounter: Admit: 2018-02-01 | Discharge: 2018-02-01 | Payer: MEDICARE | Primary: Family Medicine

## 2018-02-05 ENCOUNTER — Encounter: Admit: 2018-02-05 | Discharge: 2018-02-05 | Payer: MEDICARE | Primary: Family Medicine

## 2018-02-07 ENCOUNTER — Encounter: Primary: Family Medicine

## 2018-02-08 ENCOUNTER — Encounter: Admit: 2018-02-08 | Discharge: 2018-02-08 | Payer: MEDICARE | Primary: Family Medicine

## 2018-02-13 NOTE — Telephone Encounter (Signed)
Spoke to daughter Samella ParrMarliyn to schedule follow up diabetes  appointment per Dr. Cathie HoopsYu. Daughter verbalized understanding. Appointment scheduled 02/26/18 with Dr. Cathie HoopsYu.

## 2018-02-18 NOTE — Telephone Encounter (Addendum)
Returned the call of Larena Sox message and spoke with pharmacist who was informed that the patient required an appointment of which had been made.  Also informed the pharmacy that the physician had passed away.      Dr. Jean Rosenthal telephone   Received: 3 days ago   Message Contents   [facility] Front Office      ??      Thayer Ohm with [employer] is calling to retreive the NPI number for Dr. Zebedee Iba, in order to fill pt's prescription. Best contact is [phone].

## 2018-02-19 ENCOUNTER — Encounter: Attending: Family Medicine | Primary: Family Medicine

## 2018-02-19 MED ORDER — AMLODIPINE 10 MG TAB
10 mg | ORAL_TABLET | ORAL | 3 refills | Status: DC
Start: 2018-02-19 — End: 2018-09-23

## 2018-02-26 ENCOUNTER — Encounter: Attending: Family Medicine | Primary: Family Medicine

## 2018-02-26 MED ORDER — MYRBETRIQ 50 MG TABLET,EXTENDED RELEASE
50 mg | ORAL_TABLET | ORAL | 0 refills | Status: DC
Start: 2018-02-26 — End: 2018-05-24

## 2018-03-13 ENCOUNTER — Encounter: Attending: Family Medicine | Primary: Family Medicine

## 2018-03-23 ENCOUNTER — Encounter

## 2018-03-24 MED ORDER — ATORVASTATIN 10 MG TAB
10 mg | ORAL_TABLET | ORAL | 0 refills | Status: DC
Start: 2018-03-24 — End: 2018-06-17

## 2018-03-24 MED ORDER — CLOPIDOGREL 75 MG TAB
75 mg | ORAL_TABLET | ORAL | 0 refills | Status: DC
Start: 2018-03-24 — End: 2018-07-11

## 2018-04-02 ENCOUNTER — Encounter

## 2018-04-03 MED ORDER — PREGABALIN 100 MG CAP
100 mg | ORAL_CAPSULE | ORAL | 1 refills | Status: DC
Start: 2018-04-03 — End: 2018-04-06

## 2018-04-06 ENCOUNTER — Encounter

## 2018-04-10 NOTE — Telephone Encounter (Signed)
-----   Message from Ellamae SiaAndre Burnett sent at 04/10/2018  4:50 PM EDT -----  Regarding: Sr. Abdul-Rahman/Refill  Pt request for her "Lyrica" medication [facility] at [phone]. Pt needs refill today. Best contact number is [phone].

## 2018-04-11 MED ORDER — PREGABALIN 100 MG CAP
100 mg | ORAL_CAPSULE | ORAL | 0 refills | Status: DC
Start: 2018-04-11 — End: 2018-04-22

## 2018-04-21 NOTE — Telephone Encounter (Addendum)
Patient's daughter Chelsea Schultz(POA), called yesterday checking on the status of Lyrica prescription approved back on 6/20. Prescription is not at front desk. Please advise.

## 2018-04-21 NOTE — Telephone Encounter (Signed)
Left voicemail for patient's daughter, Lynden AngCathy to return call to office. Prescription will be ready at front desk tomorrow morning for daughter to pick up.

## 2018-04-22 ENCOUNTER — Inpatient Hospital Stay: Admit: 2018-07-18 | Payer: MEDICARE | Primary: Family Medicine

## 2018-04-22 ENCOUNTER — Ambulatory Visit: Admit: 2018-04-22 | Discharge: 2018-04-22 | Payer: MEDICARE | Attending: Family Medicine | Primary: Family Medicine

## 2018-04-22 ENCOUNTER — Encounter

## 2018-04-22 ENCOUNTER — Ambulatory Visit: Attending: Family Medicine | Primary: Family Medicine

## 2018-04-22 DIAGNOSIS — N3000 Acute cystitis without hematuria: Secondary | ICD-10-CM

## 2018-04-22 LAB — AMB POC URINALYSIS DIP STICK AUTO W/O MICRO
Bilirubin (UA POC): NEGATIVE
Bilirubin, Urine, POC: NEGATIVE
Blood (UA POC): NEGATIVE
Blood (UA POC): NEGATIVE
Glucose (UA POC): NEGATIVE
Glucose, Urine, POC: NEGATIVE
Ketones (UA POC): NEGATIVE
Ketones, Urine, POC: NEGATIVE
Nitrite, Urine, POC: NEGATIVE
Nitrites (UA POC): NEGATIVE
Protein (UA POC): NEGATIVE
Protein, Urine, POC: NEGATIVE
Specific Gravity, Urine, POC: 1.01 NA (ref 1.001–1.035)
Specific gravity (UA POC): 1.01 (ref 1.001–1.035)
Urobilinogen (UA POC): 0.2 (ref 0.2–1)
Urobilinogen, POC: 0.2 (ref 0.2–1)
pH (UA POC): 5.5 (ref 4.6–8.0)
pH, Urine, POC: 5.5 NA (ref 4.6–8.0)

## 2018-04-22 MED ORDER — PREGABALIN 100 MG CAP
100 mg | ORAL_CAPSULE | ORAL | 0 refills | Status: DC
Start: 2018-04-22 — End: 2018-08-16

## 2018-04-22 MED ORDER — METFORMIN 500 MG TAB
500 mg | ORAL_TABLET | ORAL | 1 refills | Status: AC
Start: 2018-04-22 — End: ?

## 2018-04-22 MED ORDER — AMOXICILLIN CLAVULANATE 875 MG-125 MG TAB
875-125 mg | ORAL_TABLET | Freq: Two times a day (BID) | ORAL | 0 refills | Status: AC
Start: 2018-04-22 — End: 2018-04-29

## 2018-04-22 NOTE — Progress Notes (Signed)
[facility]    Subjective:   Chelsea Schultz is a 84 y.o. female with history of see pmh  CC: burning with urination  History provided by patient and chart review    HPI:  Has had burning with urination for 1.5 weeks. Was seen at urgent care in [place] and started on Cephalexin 500mg  BID for 7 days and only took 1 day of the antibiotic. Was notified by urgent care of negative culture and urged to d/c abx and f/u with pcp if symptoms persist. Today she continues to have dysuria. Denies any hematuria, flank pain, abdominal pain, nausea, vomiting, fevers or chills.   Today, she is also requesting a refill on her metformin as she is completely out.    Past Medical History:   Diagnosis Date   ??? Arthritis    ??? CVA (cerebral infarction)    ??? Diabetes (HCC) 01/09/2015    Hemoglobin A1C 6.4 (per records, done at outside facility)   ??? GERD (gastroesophageal reflux disease)    ??? History of recurrent UTIs     on chronic antibiotic prophylaxis, followed by urology   ??? HLD (hyperlipidemia) 01/09/2015    Total Chol 141, Tri 49, HDL 59, LDL 72   ??? Hypertension    ??? Neuropathy    ??? Osteopetrosis    ??? Spinal stenosis      No Known Allergies  Current Outpatient Medications on File Prior to Visit   Medication Sig Dispense Refill   ??? pregabalin (LYRICA) 100 mg capsule TAKE 1 CAPSULE BY MOUTH EVERY DAY 90 Cap 0   ??? pregabalin (LYRICA) 100 mg capsule Take 100 mg by mouth.     ??? clopidogrel (PLAVIX) 75 mg tab TAKE 1 TABLET BY MOUTH DAILY 90 Tab 0   ??? MYRBETRIQ 50 mg ER tablet TAKE 1 TABLET BY MOUTH DAILY 90 Tab 0   ??? amLODIPine (NORVASC) 10 mg tablet TAKE 1 TABLET BY MOUTH DAILY FOR HIGH BLOOD PRESSURE 90 Tab 3   ??? metFORMIN (GLUCOPHAGE) 500 mg tablet TAKE 1 TABLET BY MOUTH DAILY WITH BREAKFAST 90 Tab 3   ??? losartan (COZAAR) 100 mg tablet TAKE 1 TABLET BY MOUTH DAILY 90 Tab 1   ??? polyethylene glycol (MIRALAX) 17 gram packet Take 17 g by mouth daily as needed (constipation).     ??? docusate sodium (STOOL SOFTENER) 100 mg  capsule Take 100 mg by mouth daily. 1-3 softgels daily     ??? acetaminophen (TYLENOL ARTHRITIS PAIN) 650 mg TbER Take 650 mg by mouth daily as needed for Pain.     ??? polyethylene glycol (MIRALAX) 17 gram/dose powder Use as needed or every other day 527 g 0   ??? losartan (COZAAR) 50 mg tablet TAKE 1 TABLET BY MOUTH DAILY 90 Tab 3   ??? polyethylene glycol (MIRALAX) 17 gram/dose powder MIX 17 GRAMS IN 8 OUNCES OF FLUID AND DRINK EVERY DAY AS DIRECTED 527 g 0   ??? nizatidine (AXID) 150 mg cap capsule Take 1 Cap by mouth nightly. (Patient taking differently: Take 150 mg by mouth as needed.) 90 Cap 1   ??? aspirin delayed-release 81 mg tablet Take  by mouth daily.     ??? ascorbic acid (VITAMIN C) 1,000 mg tablet Take 1,000 mg by mouth daily.     ??? multivitamin with iron (DAILY MULTI-VITAMINS/IRON) tablet Take 1 tablet by mouth daily.     ??? calcium-cholecalciferol, D3, (CALCIUM 600 + D) tablet Take 2 Tabs by mouth daily.  60 Tab 12   ??? atorvastatin (LIPITOR) 10 mg tablet TAKE 1 TABLET BY MOUTH DAILY 90 Tab 0     No current facility-administered medications on file prior to visit.      Family History   Problem Relation Age of Onset   ??? Diabetes Mother    ??? Stroke Mother    ??? Hypertension Mother    ??? Hypertension Father    ??? Stroke Father    ??? Cancer Sister    ??? Stroke Sister      Social History     Socioeconomic History   ??? Marital status: WIDOWED     Spouse name: Not on file   ??? Number of children: Not on file   ??? Years of education: Not on file   ??? Highest education level: Not on file   Tobacco Use   ??? Smoking status: Never Smoker   ??? Smokeless tobacco: Never Used   Substance and Sexual Activity   ??? Alcohol use: No   ??? Drug use: No   ??? Sexual activity: Never     Past Surgical History:   Procedure Laterality Date   ??? HX ORTHOPAEDIC      Lumbar laminectomy   ??? HX ORTHOPAEDIC      Lumbar fusion       Patient Active Problem List   Diagnosis Code   ??? HTN (hypertension) I10   ??? Urgency incontinence N39.41   ??? Recurrent UTI N39.0   ???  Acute ischemic stroke (HCC) I63.9   ??? Advance directive discussed with patient Z71.89   ??? At risk for falling Z91.81   ??? Pulmonary fibrosis (HCC) J84.10   ??? 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 Abnormal foot finding R29.91   ??? Other constipation K59.09     Review of Systems   Gastrointestinal: Negative for abdominal pain, nausea and vomiting.   Genitourinary: Positive for dysuria. Negative for flank pain, frequency, hematuria and urgency.         Objective:     Visit Vitals  LMP 10/23/1978        Physical Exam   Constitutional: She appears well-developed and well-nourished.   Pulmonary/Chest: Effort normal and breath sounds normal. No respiratory distress.   Abdominal: Soft. Bowel sounds are normal. She exhibits no distension. There is no tenderness. There is no rebound and no guarding.   Genitourinary:   Genitourinary Comments: No flank pain   Skin: Skin is warm.   Psychiatric: She has a normal mood and affect.       Pertinent Labs/Studies:      Assessment and orders:     Diagnoses and all orders for this visit:    1) Acute cystitis without hematuria  -     UA with 2+ LEs. Urine culture with Pan sensitive E.coli   -     Rx with Augmentin x7 days  -     amoxicillin-clavulanate (AUGMENTIN) 875-125 mg per tablet; Take 1 Tab by mouth two (2) times a day for 7 days., Normal, Disp-14 Tab, R-0    2) Controlled type 2 diabetes mellitus with diabetic polyneuropathy, without long-term current use of insulin (HCC)  -     METABOLIC PANEL, BASIC - Normal. Will refill Metformin.  -     metFORMIN (GLUCOPHAGE) 500 mg tablet; TAKE 1 TABLET BY MOUTH DAILY WITH BREAKFAST, Normal**Patient requests 90 days supply**Disp-90 Tab, R-1          I have reviewed patient  medical and social history and medications.  I have reviewed pertinent labs results and other data. I have discussed the diagnosis with the patient and the intended plan as seen in the above orders. The patient has received an after-visit summary and questions  were answered concerning future plans. I have discussed medication side effects and warnings with the patient as well.    Eden Emms, MD  [facility]  04/22/18    Patient discussed  with Dr. Burnadette Pop, Attending Physician

## 2018-04-22 NOTE — Progress Notes (Signed)
I reviewed the patient's medical history, the resident's findings on physical examination, the patient's diagnoses, and treatment plan as documented in the resident note.  I concur with the treatment plan as documented.

## 2018-04-22 NOTE — Progress Notes (Signed)
 Chief Complaint   Patient presents with   . Dysuria     1. Have you been to the ER, urgent care clinic since your last visit?  Hospitalized since your last visit?No    2. Have you seen or consulted any other health care providers outside of the [facility] since your last visit?  Include any pap smears or colon screening. No    LAST THURS--WENT TO DR IN [place] --DIDN'T FIND ANYTHING     Sx for two weeks.

## 2018-04-22 NOTE — Progress Notes (Signed)
Treated with Augmentin.

## 2018-04-22 NOTE — Progress Notes (Signed)
[facility]  Subjective:   Chelsea Schultz is a 84 y.o. female with history of see pmh  CC: burning with urination  History provided by patient and chart review    HPI:  Has had burning with urination for 1.5 weeks. Was seen at urgent care in [place] and started on Cephalexin 500mg  BID for 7 days and only took 1 day of the antibiotic. Was notified by urgent care of negative culture and urged to d/c abx and f/u with pcp if symptoms persist. Today she continues to have dysuria. Denies any hematuria, flank pain, abdominal pain, nausea, vomiting, fevers or chills.   Today, she is also requesting a refill on her metformin as she is completely out.    Past Medical History:   Diagnosis Date   ??? Arthritis    ??? CVA (cerebral infarction)    ??? Diabetes (HCC) 01/09/2015    Hemoglobin A1C 6.4 (per records, done at outside facility)   ??? GERD (gastroesophageal reflux disease)    ??? History of recurrent UTIs     on chronic antibiotic prophylaxis, followed by urology   ??? HLD (hyperlipidemia) 01/09/2015    Total Chol 141, Tri 49, HDL 59, LDL 72   ??? Hypertension    ??? Neuropathy    ??? Osteopetrosis    ??? Spinal stenosis      No Known Allergies  Current Outpatient Medications on File Prior to Visit   Medication Sig Dispense Refill   ??? pregabalin (LYRICA) 100 mg capsule TAKE 1 CAPSULE BY MOUTH EVERY DAY 90 Cap 0   ??? pregabalin (LYRICA) 100 mg capsule Take 100 mg by mouth.     ??? clopidogrel (PLAVIX) 75 mg tab TAKE 1 TABLET BY MOUTH DAILY 90 Tab 0   ??? MYRBETRIQ 50 mg ER tablet TAKE 1 TABLET BY MOUTH DAILY 90 Tab 0   ??? amLODIPine (NORVASC) 10 mg tablet TAKE 1 TABLET BY MOUTH DAILY FOR HIGH BLOOD PRESSURE 90 Tab 3   ??? metFORMIN (GLUCOPHAGE) 500 mg tablet TAKE 1 TABLET BY MOUTH DAILY WITH BREAKFAST 90 Tab 3   ??? losartan (COZAAR) 100 mg tablet TAKE 1 TABLET BY MOUTH DAILY 90 Tab 1   ??? polyethylene glycol (MIRALAX) 17 gram packet Take 17 g by mouth daily as needed (constipation).      ??? docusate sodium (STOOL SOFTENER) 100 mg capsule Take 100 mg by mouth daily. 1-3 softgels daily     ??? acetaminophen (TYLENOL ARTHRITIS PAIN) 650 mg TbER Take 650 mg by mouth daily as needed for Pain.     ??? polyethylene glycol (MIRALAX) 17 gram/dose powder Use as needed or every other day 527 g 0   ??? losartan (COZAAR) 50 mg tablet TAKE 1 TABLET BY MOUTH DAILY 90 Tab 3   ??? polyethylene glycol (MIRALAX) 17 gram/dose powder MIX 17 GRAMS IN 8 OUNCES OF FLUID AND DRINK EVERY DAY AS DIRECTED 527 g 0   ??? nizatidine (AXID) 150 mg cap capsule Take 1 Cap by mouth nightly. (Patient taking differently: Take 150 mg by mouth as needed.) 90 Cap 1   ??? aspirin delayed-release 81 mg tablet Take  by mouth daily.     ??? ascorbic acid (VITAMIN C) 1,000 mg tablet Take 1,000 mg by mouth daily.     ??? multivitamin with iron (DAILY MULTI-VITAMINS/IRON) tablet Take 1 tablet by mouth daily.     ??? calcium-cholecalciferol, D3, (CALCIUM 600 + D) tablet Take 2 Tabs by mouth daily. 60 Tab  12   ??? atorvastatin (LIPITOR) 10 mg tablet TAKE 1 TABLET BY MOUTH DAILY 90 Tab 0     No current facility-administered medications on file prior to visit.      Family History   Problem Relation Age of Onset   ??? Diabetes Mother    ??? Stroke Mother    ??? Hypertension Mother    ??? Hypertension Father    ??? Stroke Father    ??? Cancer Sister    ??? Stroke Sister      Social History     Socioeconomic History   ??? Marital status: WIDOWED     Spouse name: Not on file   ??? Number of children: Not on file   ??? Years of education: Not on file   ??? Highest education level: Not on file   Tobacco Use   ??? Smoking status: Never Smoker   ??? Smokeless tobacco: Never Used   Substance and Sexual Activity   ??? Alcohol use: No   ??? Drug use: No   ??? Sexual activity: Never     Past Surgical History:   Procedure Laterality Date   ??? HX ORTHOPAEDIC      Lumbar laminectomy   ??? HX ORTHOPAEDIC      Lumbar fusion       Patient Active Problem List   Diagnosis Code   ??? HTN (hypertension) I10    ??? Urgency incontinence N39.41   ??? Recurrent UTI N39.0   ??? Acute ischemic stroke (HCC) I63.9   ??? Advance directive discussed with patient Z71.89   ??? At risk for falling Z91.81   ??? Pulmonary fibrosis (HCC) J84.10   ??? 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 Abnormal foot finding R29.91   ??? Other constipation K59.09     Review of Systems   Gastrointestinal: Negative for abdominal pain, nausea and vomiting.   Genitourinary: Positive for dysuria. Negative for flank pain, frequency, hematuria and urgency.         Objective:     Visit Vitals  LMP 10/23/1978        Physical Exam   Constitutional: She appears well-developed and well-nourished.   Pulmonary/Chest: Effort normal and breath sounds normal. No respiratory distress.   Abdominal: Soft. Bowel sounds are normal. She exhibits no distension. There is no tenderness. There is no rebound and no guarding.   Genitourinary:   Genitourinary Comments: No flank pain   Skin: Skin is warm.   Psychiatric: She has a normal mood and affect.       Pertinent Labs/Studies:      Assessment and orders:     Diagnoses and all orders for this visit:    1) Acute cystitis without hematuria  -     UA with 2+ LEs. Urine culture with Pan sensitive E.coli   -     Rx with Augmentin x7 days  -     amoxicillin-clavulanate (AUGMENTIN) 875-125 mg per tablet; Take 1 Tab by mouth two (2) times a day for 7 days., Normal, Disp-14 Tab, R-0    2) Controlled type 2 diabetes mellitus with diabetic polyneuropathy, without long-term current use of insulin (HCC)  -     METABOLIC PANEL, BASIC - Normal. Will refill Metformin.  -     metFORMIN (GLUCOPHAGE) 500 mg tablet; TAKE 1 TABLET BY MOUTH DAILY WITH BREAKFAST, Normal**Patient requests 90 days supply**Disp-90 Tab, R-1      I have reviewed patient medical and social history and medications.  I have reviewed pertinent labs results and other data. I have discussed the diagnosis with the patient and the intended plan as seen in the above  orders. The patient has received an after-visit summary and questions were answered concerning future plans. I have discussed medication side effects and warnings with the patient as well.    Eden Emms, MD  [name]  04/22/18    Patient discussed  with Dr. Burnadette Pop, Attending Physician

## 2018-04-22 NOTE — Progress Notes (Signed)
Chief Complaint   Patient presents with   ??? Dysuria     1. Have you been to the ER, urgent care clinic since your last visit?  Hospitalized since your last visit?No    2. Have you seen or consulted any other health care providers outside of the [facility] since your last visit?  Include any pap smears or colon screening. No    LAST THURS--WENT TO DR IN [place]--DIDN'T FIND ANYTHING     Sx for two weeks.

## 2018-04-23 LAB — BASIC METABOLIC PANEL
BUN: 12 mg/dL (ref 10–36)
Bun/Cre Ratio: 16 NA (ref 12–28)
CO2: 24 mmol/L (ref 20–29)
Calcium: 10.2 mg/dL (ref 8.7–10.3)
Chloride: 102 mmol/L (ref 96–106)
Creatinine: 0.77 mg/dL (ref 0.57–1.00)
EGFR IF NonAfrican American: 68 mL/min/{1.73_m2} (ref >59)
GFR African American: 79 mL/min/{1.73_m2} (ref >59)
Glucose: 86 mg/dL (ref 65–99)
Potassium: 4.2 mmol/L (ref 3.5–5.2)
Sodium: 138 mmol/L (ref 134–144)

## 2018-04-23 LAB — METABOLIC PANEL, BASIC
BUN/Creatinine ratio: 16 (ref 12–28)
BUN: 12 mg/dL (ref 10–36)
CO2: 24 mmol/L (ref 20–29)
Calcium: 10.2 mg/dL (ref 8.7–10.3)
Chloride: 102 mmol/L (ref 96–106)
Creatinine: 0.77 mg/dL (ref 0.57–1.00)
GFR est AA: 79 mL/min/{1.73_m2} (ref >59)
GFR est non-AA: 68 mL/min/{1.73_m2} (ref >59)
Glucose: 86 mg/dL (ref 65–99)
Potassium: 4.2 mmol/L (ref 3.5–5.2)
Sodium: 138 mmol/L (ref 134–144)

## 2018-04-23 NOTE — Telephone Encounter (Signed)
Patient seen by Dr. Nance PearBleck yesterday and he prescribed this medication

## 2018-04-23 NOTE — Telephone Encounter (Signed)
Patient seen by Dr. Bleck yesterday and he prescribed this medication

## 2018-04-24 LAB — CULTURE, URINE

## 2018-05-24 MED ORDER — MYRBETRIQ 50 MG TABLET,EXTENDED RELEASE
50 mg | ORAL_TABLET | ORAL | 0 refills | Status: DC
Start: 2018-05-24 — End: 2018-09-21

## 2018-05-28 ENCOUNTER — Encounter

## 2018-05-29 MED ORDER — NIZATIDINE 150 MG CAP
150 mg | ORAL_CAPSULE | ORAL | 0 refills | Status: AC | PRN
Start: 2018-05-29 — End: ?

## 2018-06-15 ENCOUNTER — Encounter

## 2018-06-17 ENCOUNTER — Encounter

## 2018-06-17 MED ORDER — ATORVASTATIN 10 MG TAB
10 mg | ORAL_TABLET | ORAL | 1 refills | Status: AC
Start: 2018-06-17 — End: ?

## 2018-06-17 MED ORDER — ATORVASTATIN 10 MG TAB
10 mg | ORAL_TABLET | ORAL | 4 refills | Status: AC
Start: 2018-06-17 — End: ?

## 2018-06-17 NOTE — Telephone Encounter (Signed)
Per Neurology 12/2017, should have stopped Plavix.

## 2018-07-06 ENCOUNTER — Ambulatory Visit: Admit: 2018-07-06 | Discharge: 2018-07-06 | Payer: MEDICARE | Attending: Family Medicine | Primary: Family Medicine

## 2018-07-06 ENCOUNTER — Inpatient Hospital Stay: Admit: 2018-09-30 | Payer: MEDICARE | Primary: Family Medicine

## 2018-07-06 ENCOUNTER — Ambulatory Visit: Attending: Family Medicine | Primary: Family Medicine

## 2018-07-06 DIAGNOSIS — R350 Frequency of micturition: Secondary | ICD-10-CM

## 2018-07-06 DIAGNOSIS — M25552 Pain in left hip: Secondary | ICD-10-CM

## 2018-07-06 LAB — AMB POC URINALYSIS DIP STICK AUTO W/O MICRO
Bilirubin (UA POC): NEGATIVE
Bilirubin, Urine, POC: NEGATIVE
Blood (UA POC): NEGATIVE
Blood (UA POC): NEGATIVE
Glucose (UA POC): NEGATIVE
Glucose, Urine, POC: NEGATIVE
Ketones (UA POC): NEGATIVE
Ketones, Urine, POC: NEGATIVE
Nitrite, Urine, POC: NEGATIVE
Nitrites (UA POC): NEGATIVE
Protein (UA POC): NEGATIVE
Protein, Urine, POC: NEGATIVE
Specific Gravity, Urine, POC: 1.025 NA (ref 1.001–1.035)
Specific gravity (UA POC): 1.025 (ref 1.001–1.035)
Urobilinogen (UA POC): 0.2 (ref 0.2–1)
Urobilinogen, POC: 0.2 (ref 0.2–1)
pH (UA POC): 5.5 (ref 4.6–8.0)
pH, Urine, POC: 5.5 NA (ref 4.6–8.0)

## 2018-07-06 MED ORDER — CAPSAICIN 0.1 % TOPICAL CREAM
0.1 % | Freq: Four times a day (QID) | CUTANEOUS | 0 refills | Status: AC
Start: 2018-07-06 — End: 2018-07-11

## 2018-07-06 MED ORDER — DICLOFENAC 1 % TOPICAL GEL
1 % | Freq: Four times a day (QID) | CUTANEOUS | 0 refills | Status: AC
Start: 2018-07-06 — End: 2018-07-13

## 2018-07-06 NOTE — Progress Notes (Signed)
HISTORY OF PRESENT ILLNESS  Chelsea Schultz is a 84 y.o. female.  HPI  Presents for left hip/lower back pain x3 weeks. Pain starts in posterior hip and radiates down left posterior thigh to knee area, also sometimes in anterior left groin. Pain experienced with ambulation or standing up. None at rest. No numbness or tingling in left foot. No right sided symptoms. She reports x of back/hip pain & sciatica in the past. Denies hx of trauma or falls. [facility], daughter has copy of xray hip stating decreased joint space & degenerative changes but no acute process. She was prescribed lidoderm patches which was not covered by insurance. She has been taking tylenol but it does not help.   She also complains of intermittent urinary frequency and burning x2-3 weeks. Has hx of recurrent UTIs last one in July. She denies abdominal pain or fever or hematuria. She has tried no meds.     Review of Systems   Constitutional: Negative for chills and fever.   Gastrointestinal: Negative for abdominal pain.   Genitourinary: Positive for dysuria and frequency. Negative for flank pain and hematuria.   Musculoskeletal: Positive for back pain and joint pain. Negative for myalgias.     Past Medical History:   Diagnosis Date   ??? Arthritis    ??? CVA (cerebral infarction)    ??? Diabetes (HCC) 01/09/2015    Hemoglobin A1C 6.4 (per records, done at outside facility)   ??? GERD (gastroesophageal reflux disease)    ??? History of recurrent UTIs     on chronic antibiotic prophylaxis, followed by urology   ??? HLD (hyperlipidemia) 01/09/2015    Total Chol 141, Tri 49, HDL 59, LDL 72   ??? Hypertension    ??? Neuropathy    ??? Osteopetrosis    ??? Spinal stenosis      Past Surgical History:   Procedure Laterality Date   ??? HX ORTHOPAEDIC      Lumbar laminectomy   ??? HX ORTHOPAEDIC      Lumbar fusion     Social History     Socioeconomic History   ??? Marital status: WIDOWED     Spouse name: Not on file   ??? Number  of children: Not on file   ??? Years of education: Not on file   ??? Highest education level: Not on file   Tobacco Use   ??? Smoking status: Never Smoker   ??? Smokeless tobacco: Never Used   Substance and Sexual Activity   ??? Alcohol use: No   ??? Drug use: No   ??? Sexual activity: Never     Family History   Problem Relation Age of Onset   ??? Diabetes Mother    ??? Stroke Mother    ??? Hypertension Mother    ??? Hypertension Father    ??? Stroke Father    ??? Cancer Sister    ??? Stroke Sister      Current Outpatient Medications on File Prior to Visit   Medication Sig Dispense Refill   ??? atorvastatin (LIPITOR) 10 mg tablet TAKE 1 TABLET BY MOUTH DAILY 90 Tab 4   ??? atorvastatin (LIPITOR) 10 mg tablet TAKE 1 TABLET BY MOUTH DAILY 90 Tab 1   ??? nizatidine (AXID) 150 mg cap capsule Take 1 Cap by mouth as needed (GERD). 90 Cap 0   ??? MYRBETRIQ 50 mg ER tablet TAKE 1 TABLET BY MOUTH DAILY 90 Tab 0   ???  pregabalin (LYRICA) 100 mg capsule TAKE 1 CAPSULE BY MOUTH EVERY DAY 90 Cap 0   ??? metFORMIN (GLUCOPHAGE) 500 mg tablet TAKE 1 TABLET BY MOUTH DAILY WITH BREAKFAST 90 Tab 1   ??? pregabalin (LYRICA) 100 mg capsule Take 100 mg by mouth.     ??? clopidogrel (PLAVIX) 75 mg tab TAKE 1 TABLET BY MOUTH DAILY 90 Tab 0   ??? amLODIPine (NORVASC) 10 mg tablet TAKE 1 TABLET BY MOUTH DAILY FOR HIGH BLOOD PRESSURE 90 Tab 3   ??? losartan (COZAAR) 100 mg tablet TAKE 1 TABLET BY MOUTH DAILY 90 Tab 1   ??? polyethylene glycol (MIRALAX) 17 gram packet Take 17 g by mouth daily as needed (constipation).     ??? docusate sodium (STOOL SOFTENER) 100 mg capsule Take 100 mg by mouth daily. 1-3 softgels daily     ??? acetaminophen (TYLENOL ARTHRITIS PAIN) 650 mg TbER Take 650 mg by mouth daily as needed for Pain.     ??? losartan (COZAAR) 50 mg tablet TAKE 1 TABLET BY MOUTH DAILY 90 Tab 3   ??? polyethylene glycol (MIRALAX) 17 gram/dose powder MIX 17 GRAMS IN 8 OUNCES OF FLUID AND DRINK EVERY DAY AS DIRECTED 527 g 0   ??? aspirin delayed-release 81 mg tablet Take  by mouth daily.     ???  ascorbic acid (VITAMIN C) 1,000 mg tablet Take 1,000 mg by mouth daily.     ??? multivitamin with iron (DAILY MULTI-VITAMINS/IRON) tablet Take 1 tablet by mouth daily.     ??? calcium-cholecalciferol, D3, (CALCIUM 600 + D) tablet Take 2 Tabs by mouth daily. 60 Tab 12     No current facility-administered medications on file prior to visit.      No Known Allergies    Physical Exam   Constitutional: She is oriented to person, place, and time. She appears well-developed and well-nourished. No distress.   BP 151/79 (BP 1 Location: Left arm, BP Patient Position: Sitting)    Pulse 79    Temp 98.1 ??F (36.7 ??C) (Oral)    Resp 16    Ht 4\' 7"  (1.397 m)    Wt 123 lb (55.8 kg)    LMP 10/23/1978    SpO2 95%    BMI 28.59 kg/m??    HENT:   Right Ear: Tympanic membrane normal.   Left Ear: Tympanic membrane normal.   Eyes: EOM are normal.   Neck: Normal range of motion. Neck supple.   Cardiovascular: Normal rate, regular rhythm, normal heart sounds and intact distal pulses.   No murmur heard.  Pulmonary/Chest: Effort normal and breath sounds normal. No stridor. No respiratory distress. She has no wheezes.   Abdominal: Soft. Bowel sounds are normal. She exhibits no distension. There is no tenderness. There is no rebound, no guarding and no CVA tenderness.   Musculoskeletal: Normal range of motion. She exhibits no edema.        Left hip: She exhibits tenderness (postereolateral tenderness to palpation).        Lumbar back: She exhibits pain (tenderness over left lower back). She exhibits no bony tenderness and no spasm.   Negative SLR b/l   Neurological: She is alert and oriented to person, place, and time. No cranial nerve deficit or sensory deficit.   Reflex Scores:       Patellar reflexes are 2+ on the right side and 2+ on the left side.  Skin: Skin is warm and dry. No rash noted.   Psychiatric: She has a  normal mood and affect. Her behavior is normal.   Nursing note and vitals reviewed.      Results for orders placed or performed in  visit on 07/06/18   AMB POC URINALYSIS DIP STICK AUTO W/O MICRO   Result Value Ref Range    Color (UA POC) Yellow     Clarity (UA POC) Clear     Glucose (UA POC) Negative Negative    Bilirubin (UA POC) Negative Negative    Ketones (UA POC) Negative Negative    Specific gravity (UA POC) 1.025 1.001 - 1.035    Blood (UA POC) Negative Negative    pH (UA POC) 5.5 4.6 - 8.0    Protein (UA POC) Negative Negative    Urobilinogen (UA POC) 0.2 mg/dL 0.2 - 1    Nitrites (UA POC) Negative Negative    Leukocyte esterase (UA POC) 2+ Negative         ASSESSMENT and PLAN    ICD-10-CM ICD-9-CM    1. Left hip pain M25.552 719.45 capsaicin (CAPZASIN-HP) 0.1 % topical cream      diclofenac (VOLTAREN) 1 % gel   2. Urinary frequency R35.0 788.41 AMB POC URINALYSIS DIP STICK AUTO W/O MICRO      CULTURE, URINE   3. Urinary pain R30.9 788.1 AMB POC URINALYSIS DIP STICK AUTO W/O MICRO      CULTURE, URINE     Left hip/low back pain seen 2 weeks ago at [facility] had negative hip xray except for degenerative changes, neuro exam grossly normal. Daughter requesting rx for capsaicin as her lidoderm was not covered, try for 5 days if not better may try voltaren gel. Continue tylenol prn. If symptoms worsening or not improving advised to f/u or see ortho.   Intermittent urinary sxs UA only with leuks will check cx and treat accordingly. Daughter/pt agree with plan. Return prn.     Follow-up and Dispositions    ?? Return if symptoms worsen or fail to improve.       Teleshia Lemere Jerene Bears, MD

## 2018-07-06 NOTE — Progress Notes (Signed)
Negative urine culture. Please inform patient/daughter.

## 2018-07-06 NOTE — Progress Notes (Signed)
 Chief Complaint   Patient presents with   . Hip Pain     Patient present with left hip pain radiating posteriorly to knee x 2 weeks. No trauma.    . Urinary Pain   . Urinary Frequency     1. Have you been to the ER, urgent care clinic since your last visit?  Hospitalized since your last visit? [facility]    2. Have you seen or consulted any other health care providers outside of the [facility] since your last visit?  Include any pap smears or colon screening. [facility]     Visit Vitals  BP 151/79 (BP 1 Location: Left arm, BP Patient Position: Sitting)   Pulse 79   Temp 98.1 F (36.7 C) (Oral)   Resp 16   Ht 4' 7 (1.397 m)   Wt 123 lb (55.8 kg)   SpO2 95%   BMI 28.59 kg/m

## 2018-07-06 NOTE — Progress Notes (Signed)
HISTORY OF PRESENT ILLNESS  Chelsea Schultz is a 84 y.o. female.  HPI  Presents for left hip/lower back pain x3 weeks. Pain starts in posterior hip and radiates down left posterior thigh to knee area, also sometimes in anterior left groin. Pain experienced with ambulation or standing up. None at rest. No numbness or tingling in left foot. No right sided symptoms. She reports x of back/hip pain & sciatica in the past. Denies hx of trauma or falls. Was evaluated at [facility] (pt lives between here & [facility]) 2 weeks ago, daughter has copy of xray hip stating decreased joint space & degenerative changes but no acute process. She was prescribed lidoderm patches which was not covered by insurance. She has been taking tylenol but it does not help.   She also complains of intermittent urinary frequency and burning x2-3 weeks. Has hx of recurrent UTIs last one in July. She denies abdominal pain or fever or hematuria. She has tried no meds.     Review of Systems   Constitutional: Negative for chills and fever.   Gastrointestinal: Negative for abdominal pain.   Genitourinary: Positive for dysuria and frequency. Negative for flank pain and hematuria.   Musculoskeletal: Positive for back pain and joint pain. Negative for myalgias.     Past Medical History:   Diagnosis Date   ??? Arthritis    ??? CVA (cerebral infarction)    ??? Diabetes (HCC) 01/09/2015    Hemoglobin A1C 6.4 (per records, done at outside facility)   ??? GERD (gastroesophageal reflux disease)    ??? History of recurrent UTIs     on chronic antibiotic prophylaxis, followed by urology   ??? HLD (hyperlipidemia) 01/09/2015    Total Chol 141, Tri 49, HDL 59, LDL 72   ??? Hypertension    ??? Neuropathy    ??? Osteopetrosis    ??? Spinal stenosis      Past Surgical History:   Procedure Laterality Date   ??? HX ORTHOPAEDIC      Lumbar laminectomy   ??? HX ORTHOPAEDIC      Lumbar fusion     Social History     Socioeconomic History   ??? Marital status: WIDOWED     Spouse name: Not on file    ??? Number of children: Not on file   ??? Years of education: Not on file   ??? Highest education level: Not on file   Tobacco Use   ??? Smoking status: Never Smoker   ??? Smokeless tobacco: Never Used   Substance and Sexual Activity   ??? Alcohol use: No   ??? Drug use: No   ??? Sexual activity: Never     Family History   Problem Relation Age of Onset   ??? Diabetes Mother    ??? Stroke Mother    ??? Hypertension Mother    ??? Hypertension Father    ??? Stroke Father    ??? Cancer Sister    ??? Stroke Sister      Current Outpatient Medications on File Prior to Visit   Medication Sig Dispense Refill   ??? atorvastatin (LIPITOR) 10 mg tablet TAKE 1 TABLET BY MOUTH DAILY 90 Tab 4   ??? atorvastatin (LIPITOR) 10 mg tablet TAKE 1 TABLET BY MOUTH DAILY 90 Tab 1   ??? nizatidine (AXID) 150 mg cap capsule Take 1 Cap by mouth as needed (GERD). 90 Cap 0   ??? MYRBETRIQ 50 mg ER tablet TAKE 1 TABLET BY MOUTH DAILY 90 Tab 0   ???  pregabalin (LYRICA) 100 mg capsule TAKE 1 CAPSULE BY MOUTH EVERY DAY 90 Cap 0   ??? metFORMIN (GLUCOPHAGE) 500 mg tablet TAKE 1 TABLET BY MOUTH DAILY WITH BREAKFAST 90 Tab 1   ??? pregabalin (LYRICA) 100 mg capsule Take 100 mg by mouth.     ??? clopidogrel (PLAVIX) 75 mg tab TAKE 1 TABLET BY MOUTH DAILY 90 Tab 0   ??? amLODIPine (NORVASC) 10 mg tablet TAKE 1 TABLET BY MOUTH DAILY FOR HIGH BLOOD PRESSURE 90 Tab 3   ??? losartan (COZAAR) 100 mg tablet TAKE 1 TABLET BY MOUTH DAILY 90 Tab 1   ??? polyethylene glycol (MIRALAX) 17 gram packet Take 17 g by mouth daily as needed (constipation).     ??? docusate sodium (STOOL SOFTENER) 100 mg capsule Take 100 mg by mouth daily. 1-3 softgels daily     ??? acetaminophen (TYLENOL ARTHRITIS PAIN) 650 mg TbER Take 650 mg by mouth daily as needed for Pain.     ??? losartan (COZAAR) 50 mg tablet TAKE 1 TABLET BY MOUTH DAILY 90 Tab 3   ??? polyethylene glycol (MIRALAX) 17 gram/dose powder MIX 17 GRAMS IN 8 OUNCES OF FLUID AND DRINK EVERY DAY AS DIRECTED 527 g 0   ??? aspirin delayed-release 81 mg tablet Take  by mouth daily.      ??? ascorbic acid (VITAMIN C) 1,000 mg tablet Take 1,000 mg by mouth daily.     ??? multivitamin with iron (DAILY MULTI-VITAMINS/IRON) tablet Take 1 tablet by mouth daily.     ??? calcium-cholecalciferol, D3, (CALCIUM 600 + D) tablet Take 2 Tabs by mouth daily. 60 Tab 12     No current facility-administered medications on file prior to visit.      No Known Allergies    Physical Exam   Constitutional: She is oriented to person, place, and time. She appears well-developed and well-nourished. No distress.   BP 151/79 (BP 1 Location: Left arm, BP Patient Position: Sitting)    Pulse 79    Temp 98.1 ??F (36.7 ??C) (Oral)    Resp 16    Ht 4\' 7"  (1.397 m)    Wt 123 lb (55.8 kg)    LMP 10/23/1978    SpO2 95%    BMI 28.59 kg/m??    HENT:   Right Ear: Tympanic membrane normal.   Left Ear: Tympanic membrane normal.   Eyes: EOM are normal.   Neck: Normal range of motion. Neck supple.   Cardiovascular: Normal rate, regular rhythm, normal heart sounds and intact distal pulses.   No murmur heard.  Pulmonary/Chest: Effort normal and breath sounds normal. No stridor. No respiratory distress. She has no wheezes.   Abdominal: Soft. Bowel sounds are normal. She exhibits no distension. There is no tenderness. There is no rebound, no guarding and no CVA tenderness.   Musculoskeletal: Normal range of motion. She exhibits no edema.        Left hip: She exhibits tenderness (postereolateral tenderness to palpation).        Lumbar back: She exhibits pain (tenderness over left lower back). She exhibits no bony tenderness and no spasm.   Negative SLR b/l   Neurological: She is alert and oriented to person, place, and time. No cranial nerve deficit or sensory deficit.   Reflex Scores:       Patellar reflexes are 2+ on the right side and 2+ on the left side.  Skin: Skin is warm and dry. No rash noted.   Psychiatric: She has a  normal mood and affect. Her behavior is normal.   Nursing note and vitals reviewed.       Results for orders placed or performed in visit on 07/06/18   AMB POC URINALYSIS DIP STICK AUTO W/O MICRO   Result Value Ref Range    Color (UA POC) Yellow     Clarity (UA POC) Clear     Glucose (UA POC) Negative Negative    Bilirubin (UA POC) Negative Negative    Ketones (UA POC) Negative Negative    Specific gravity (UA POC) 1.025 1.001 - 1.035    Blood (UA POC) Negative Negative    pH (UA POC) 5.5 4.6 - 8.0    Protein (UA POC) Negative Negative    Urobilinogen (UA POC) 0.2 mg/dL 0.2 - 1    Nitrites (UA POC) Negative Negative    Leukocyte esterase (UA POC) 2+ Negative         ASSESSMENT and PLAN    ICD-10-CM ICD-9-CM    1. Left hip pain M25.552 719.45 capsaicin (CAPZASIN-HP) 0.1 % topical cream      diclofenac (VOLTAREN) 1 % gel   2. Urinary frequency R35.0 788.41 AMB POC URINALYSIS DIP STICK AUTO W/O MICRO      CULTURE, URINE   3. Urinary pain R30.9 788.1 AMB POC URINALYSIS DIP STICK AUTO W/O MICRO      CULTURE, URINE     Left hip/low back pain seen 2 weeks ago at [facility] had negative hip xray except for degenerative changes, neuro exam grossly normal. Daughter requesting rx for capsaicin as her lidoderm was not covered, try for 5 days if not better may try voltaren gel. Continue tylenol prn. If symptoms worsening or not improving advised to f/u or see ortho.   Intermittent urinary sxs UA only with leuks will check cx and treat accordingly. Daughter/pt agree with plan. Return prn.     Follow-up and Dispositions    ?? Return if symptoms worsen or fail to improve.       Ramie Palladino Jerene Bears, MD

## 2018-07-06 NOTE — Progress Notes (Signed)
Negative urine culture. Please inform patient/daughter.

## 2018-07-06 NOTE — Progress Notes (Signed)
Chief Complaint   Patient presents with   ??? Hip Pain     Patient present with left hip pain radiating posteriorly to knee x 2 weeks. No trauma.    ??? Urinary Pain   ??? Urinary Frequency     1. Have you been to the ER, urgent care clinic since your last visit?  Hospitalized since your last visit? [facility]    2. Have you seen or consulted any other health care providers outside of the [facility] since your last visit?  Include any pap smears or colon screening. [facility]     Visit Vitals  BP 151/79 (BP 1 Location: Left arm, BP Patient Position: Sitting)   Pulse 79   Temp 98.1 ??F (36.7 ??C) (Oral)   Resp 16   Ht 4' 7" (1.397 m)   Wt 123 lb (55.8 kg)   SpO2 95%   BMI 28.59 kg/m??

## 2018-07-07 ENCOUNTER — Encounter

## 2018-07-08 LAB — CULTURE, URINE

## 2018-07-08 NOTE — Telephone Encounter (Signed)
Lonna CobbAlbaz, R MD/ telephone   Received: 2 days ago   Message Contents   Forde RadonHughes, Tara M  P Sffp Front Office      ??      General Message/Vendor Calls     Caller's first and last name: ??Glenice LaineCathy Billinger pts daughter     Reason for call: an order needs to be put in for the higher percentage capzaxin hp . Pt uses [facility] on [address]       Callback required yes/no and why: HP is over the counter and that is an out of pocket expense that is why this must be changed per pts daughter.       Best contact number(s): [phone]     Details to clarify the request:       Buren Kosara M Hughes

## 2018-07-08 NOTE — Telephone Encounter (Signed)
-----   Message from Dayna RamusLynesha M Johnson sent at 07/06/2018  1:32 PM EDT -----  Regarding: Dr. Albaz/ Telephone   General Message/Vendor Calls    Caller's first and last name: Gardner Cand[facility]      Reason for call: The medication that was prescribed, "capzasin hp" is the same as over the counter so they are having to pay out of pocket. She is needing the percentage to be a higher level % so that  the insurance will cover it.       Callback required yes/no and why: yes       Best contact number(s): [phone]      Details to clarify the request:      Dayna RamusLynesha M Johnson

## 2018-07-09 NOTE — Telephone Encounter (Signed)
The only stronger formulation is the 8% patch which requires the area to be pre-treated with a topical anesthetic and can only be applied for 60 min once every 3 months so I do not recommend it. She can get the voltaren gel I prescribed and use that instead. Please inform.

## 2018-07-10 NOTE — Telephone Encounter (Signed)
Verified patient by 2 identifiers with Daughter Glenice LaineCathy Zwilling. Informed Cathy per Dr Jerene BearsAlbaz of the only stronger formulation is the 8% patch which requires the area to be pre-treated with a topical anesthetic and can only be applied for 60 minutes once every 3 months. Inquired if patient received the voltaren gel from pharmacy. Lynden AngCathy states they did and they bought over the counter Capsaicin. Lynden AngCathy is very upset that the call is just being answered from Saturday afternoon once leaving the practice. Lynden AngCathy would like for the situation to be addressed since this have not only been once among other times of calling the practice and not getting a response back in days. Apologize to Ms Lynden AngCathy multiple times. In which she continuously states that this have happened more than once. Informed Ms Lynden AngCathy that I will place a Prior Authorization through the patient insurance company and await for a response from the insurance company for the Capsaicin prescription. Lynden AngCathy ask that she be contacted once the prescription is denied or approved. Agreed with Ms Lynden AngCathy that I will reach back out to her as soon as I retrieve the response.

## 2018-07-11 ENCOUNTER — Encounter

## 2018-07-11 MED ORDER — DIPYRIDAMOLE-ASPIRIN SR 200 MG-25 MG MULTIPHASE 12 HR CAP
25-200 mg | ORAL_CAPSULE | Freq: Two times a day (BID) | ORAL | 3 refills | Status: DC
Start: 2018-07-11 — End: 2018-07-11

## 2018-07-11 MED ORDER — DIPYRIDAMOLE-ASPIRIN SR 200 MG-25 MG MULTIPHASE 12 HR CAP
25-200 mg | ORAL_CAPSULE | ORAL | 3 refills | Status: DC
Start: 2018-07-11 — End: 2018-09-11

## 2018-07-11 NOTE — Telephone Encounter (Signed)
Called patient to discuss medication.   Upon review of medical records, [place] that showed acute versus subacute strokes in the thalamus.  Daughter was unaware of this.  Per daughter patient was on aspirin and Plavix at the time.  Neurology was considering discontinuing 1 of those medications.  Given her history and these findings, I would recommend Aggrenox versus monotherapy alone at this time.  Informed to stop aspirin and Plavix, start Aggrenox, and follow-up in 1 month to reassess.

## 2018-07-11 NOTE — Telephone Encounter (Addendum)
Verified 'patient by 2 identifiers with Daughter Lynden AngCathy. Notified Cathy of negative urine culture per Dr Jerene BearsAlbaz. Cathy verbalized understanding.    ----- Message from Dorrene Germanama Albaz, MD sent at 07/09/2018  4:07 PM EDT -----  Negative urine culture. Please inform patient/daughter.

## 2018-07-11 NOTE — Telephone Encounter (Signed)
Patient daughter and POA/Samples-[place],Cathy      Returning call to Dr. Cathie HoopsYu    Call transferred to nurse Terrance MassMichelle E

## 2018-07-11 NOTE — Telephone Encounter (Signed)
Called to speak to patient.  Son in law said patient is sleeping.  On dual anti-platelet therapy.  No recent Neurology notes on file.  Need to determine if she still needs to be on dual anti-platelet therapy vs mono.

## 2018-07-11 NOTE — Telephone Encounter (Signed)
Dr Cathie HoopsYu took telephone call.

## 2018-07-18 NOTE — Telephone Encounter (Signed)
Awaiting PA approval for Capsaicin.

## 2018-07-20 NOTE — Telephone Encounter (Signed)
Attempted to reach Lynden AngCathy (daughter of patient) to inform her of denial of medication. Daughter and patient unavailable. Unable to LVM.    Denied on September 26  Request Reference Number: [id number]. CAPSAICIN CRE 0.1% is denied for not meeting the prior authorization requirement(s). For further questions, call [phone]. Appeals are not supported through ePA. Please refer to the fax case notice for appeals information and instructions.    Per request response PA was denied.    Will reach out to inform patient or daughter of denial.

## 2018-08-16 ENCOUNTER — Encounter

## 2018-08-16 NOTE — Telephone Encounter (Signed)
-----   Message from Lamar Sprinkles sent at 08/16/2018  9:54 AM EDT -----  Regarding: Dr. Cathie Hoops refill  Medication Refill    Caller (if not patient):  Rhunette Croft    Relationship of caller (if not patient):  Pt's daughter    Best contact number(s):  [phone]    Name of medication and dosage if known:    Lyrica 100 mg  Is patient out of this medication (yes/no):  YES    Pharmacy name: [facility]    Pharmacy listed in chart? (yes/no): YES  Pharmacy phone number:      Details to clarify the request:      Lamar Sprinkles

## 2018-08-20 MED ORDER — PREGABALIN 100 MG CAP
100 mg | ORAL_CAPSULE | ORAL | 0 refills | Status: DC
Start: 2018-08-20 — End: 2018-11-20

## 2018-08-25 ENCOUNTER — Encounter

## 2018-08-26 ENCOUNTER — Encounter: Attending: Family Medicine | Primary: Family Medicine

## 2018-08-26 MED ORDER — LOSARTAN 100 MG TAB
100 mg | ORAL_TABLET | ORAL | 3 refills | Status: DC
Start: 2018-08-26 — End: 2019-07-12

## 2018-09-11 ENCOUNTER — Telehealth

## 2018-09-11 MED ORDER — DIPYRIDAMOLE-ASPIRIN SR 200 MG-25 MG MULTIPHASE 12 HR CAP
25-200 mg | ORAL_CAPSULE | Freq: Two times a day (BID) | ORAL | 3 refills | Status: DC
Start: 2018-09-11 — End: 2018-09-23

## 2018-09-11 NOTE — Telephone Encounter (Addendum)
Called back patient's daughter, relayed message from Dr. Cathie HoopsYu below. Made her aware that Chelsea Schultz is due for a diabetes check. While on the phone I scheduled patient for her diabetes follow up 09/23/18. Daughter aware of appointment and time. She will bring patient in.

## 2018-09-11 NOTE — Telephone Encounter (Signed)
-----   Message from Lamar SprinklesIesha L Alexander sent at 09/11/2018  9:49 AM EST -----  Regarding: Dr. Cathie HoopsYu/ refill  Medication Refill    Caller (if not patient):  Concord Waddell    Relationship of caller (if not patient):  Pt's daughter     Best contact number(s):  [phone]    Name of medication and dosage if known:  Diabetic needles and also a refill on the medication that is a aspirin for blood thinning combine ( she forgot the name of the medication)     Is patient out of this medication (yes/no):  yes    Pharmacy name: [facility]     Pharmacy listed in chart? (yes/no): no   Pharmacy phone number: [phone]      Details to clarify the request:      Lamar SprinklesIesha L Alexander

## 2018-09-23 ENCOUNTER — Ambulatory Visit: Admit: 2018-09-23 | Discharge: 2018-09-23 | Payer: MEDICARE | Attending: Family Medicine | Primary: Family Medicine

## 2018-09-23 ENCOUNTER — Inpatient Hospital Stay: Admit: 2018-09-24 | Payer: MEDICARE | Primary: Family Medicine

## 2018-09-23 ENCOUNTER — Ambulatory Visit: Attending: Family Medicine | Primary: Family Medicine

## 2018-09-23 DIAGNOSIS — E119 Type 2 diabetes mellitus without complications: Secondary | ICD-10-CM

## 2018-09-23 DIAGNOSIS — R300 Dysuria: Secondary | ICD-10-CM

## 2018-09-23 LAB — AMB POC URINALYSIS DIP STICK AUTO W/O MICRO
Bilirubin (UA POC): NEGATIVE
Bilirubin, Urine, POC: NEGATIVE
Blood (UA POC): NEGATIVE
Blood (UA POC): NEGATIVE
Ketones (UA POC): NEGATIVE
Ketones, Urine, POC: NEGATIVE
Leukocyte Esterase, Urine, POC: NEGATIVE
Leukocyte esterase (UA POC): NEGATIVE
Nitrite, Urine, POC: POSITIVE
Nitrites (UA POC): POSITIVE
Protein (UA POC): NEGATIVE
Protein, Urine, POC: NEGATIVE
Specific Gravity, Urine, POC: 1.025 NA (ref 1.001–1.035)
Specific gravity (UA POC): 1.025 (ref 1.001–1.035)
Urobilinogen (UA POC): 0.2 (ref 0.2–1)
Urobilinogen, POC: 0.2 (ref 0.2–1)
pH (UA POC): 6.5 (ref 4.6–8.0)
pH, Urine, POC: 6.5 NA (ref 4.6–8.0)

## 2018-09-23 LAB — AMB POC URINE, MICROALBUMIN, SEMIQUANT (3 RESULTS)
ALBUMIN, URINE POC: 30 mg/L
Albumin, Urine POC: 30 mg/L
CREATININE, URINE POC: 200 mg/dL
Creatinine, Urine, POC: 200 mg/dL
Microalb/Creat Ratio POC: <30 MG/G (ref <30)
Microalbumin/creat ratio (POC): <30 MG/G (ref <30)

## 2018-09-23 MED ORDER — CEPHALEXIN 500 MG CAP
500 mg | ORAL_CAPSULE | Freq: Four times a day (QID) | ORAL | 0 refills | Status: AC
Start: 2018-09-23 — End: 2018-09-28

## 2018-09-23 MED ORDER — AMLODIPINE 10 MG TAB
10 mg | ORAL_TABLET | ORAL | 3 refills | Status: AC
Start: 2018-09-23 — End: ?

## 2018-09-23 MED ORDER — DIPYRIDAMOLE-ASPIRIN SR 200 MG-25 MG MULTIPHASE 12 HR CAP
25-200 mg | ORAL_CAPSULE | Freq: Two times a day (BID) | ORAL | 3 refills | Status: DC
Start: 2018-09-23 — End: 2019-05-05

## 2018-09-23 NOTE — Addendum Note (Signed)
Addendum  Note by Marciano Sequinurner, Sheryl C at 09/23/18 1315                Author: Marciano Sequinurner, Sheryl C  Service: --  Author Type: --       Filed: 09/23/18 1419  Encounter Date: 09/23/2018  Status: Signed          Editor: Marciano Sequinurner, Sheryl C          Addended by: Billey CoURNER, SHERYL C on: 09/23/2018 02:19 PM    Modules accepted: Orders

## 2018-09-23 NOTE — Progress Notes (Signed)
 Identified Patient with two Patient identifiers (Name and DOB). Two Patient Identifiers confirmed. Reviewed record in preparation for visit and have obtained necessary documentation.    Chief Complaint   Patient presents with   . Diabetes     follow up   . Hypertension     follow up   . Dysuria     burning with urination for a couple weeks       Visit Vitals  BP 166/82 (BP 1 Location: Left arm, BP Patient Position: Sitting)   Pulse 78   Temp 97.9 F (36.6 C) (Oral)   Resp 18   Ht 4' 7 (1.397 m)   Wt 122 lb (55.3 kg)   SpO2 95%   BMI 28.36 kg/m       1. Have you been to the ER, urgent care clinic since your last visit?  Hospitalized since your last visit? Yes, Patient was admitted to [facility] for hip pain and inability to walk September 2019    2. Have you seen or consulted any other health care providers outside of the [facility] since your last visit?  Include any pap smears or colon screening. Yes, see above.

## 2018-09-23 NOTE — Progress Notes (Signed)
Chelsea Schultz is a 84 y.o. female here today to address the following issues:  Chief Complaint   Patient presents with   ??? Diabetes     follow up   ??? Hypertension     follow up   ??? Dysuria     burning with urination for a couple weeks     Here for follow up on diabetes.    Lab Results   Component Value Date/Time    Hemoglobin A1c 6.9 (H) 08/13/2017 03:48 PM    Hemoglobin A1c 6.2 (H) 12/01/2016 04:10 PM    Hemoglobin A1c 6.0 (H) 07/21/2016 11:17 AM    Microalb/Creat ratio (ug/mg creat.) 6.6 12/01/2016 04:10 PM    LDL,Direct 87 08/13/2017 03:48 PM    LDL, calculated 53 07/21/2016 11:17 AM    Creatinine 0.77 04/22/2018 04:27 PM    Hemoglobin A1c, External 6.4 01/12/2015    Hemoglobin A1c, External 6.4 01/09/2015    Hemoglobin A1c, External 6.4 06/11/2014 10:39 AM         POC Glucose last 24hrs:   [facility]    Preventive:   Influenza and Pneumovax: Refuses immunizations   Diabetic Retina screening:  Sees ophthalmology yearly    Diet and exercise:  Eats whatever she wants.  Tries to minimize grease and sugars. Does going through PT for hip and joint pain.     BP high today. At home, usually 130-140s/60-70s.     Past Medical History:   Diagnosis Date   ??? Arthritis    ??? CVA (cerebral infarction)    ??? Diabetes (HCC) 01/09/2015    Hemoglobin A1C 6.4 (per records, done at outside facility)   ??? GERD (gastroesophageal reflux disease)    ??? History of recurrent UTIs     on chronic antibiotic prophylaxis, followed by urology   ??? HLD (hyperlipidemia) 01/09/2015    Total Chol 141, Tri 49, HDL 59, LDL 72   ??? Hypertension    ??? Neuropathy    ??? Osteopetrosis    ??? Spinal stenosis      Past Surgical History:   Procedure Laterality Date   ??? HX ORTHOPAEDIC      Lumbar laminectomy   ??? HX ORTHOPAEDIC      Lumbar fusion     Social History     Socioeconomic History   ??? Marital status: WIDOWED     Spouse name: Not on file   ??? Number of children: Not on file   ??? Years of education: Not on file   ??? Highest education level:  Not on file   Occupational History   ??? Not on file   Social Needs   ??? Financial resource strain: Not on file   ??? Food insecurity:     Worry: Not on file     Inability: Not on file   ??? Transportation needs:     Medical: Not on file     Non-medical: Not on file   Tobacco Use   ??? Smoking status: Never Smoker   ??? Smokeless tobacco: Never Used   Substance and Sexual Activity   ??? Alcohol use: No   ??? Drug use: No   ??? Sexual activity: Never   Lifestyle   ??? Physical activity:     Days per week: Not on file     Minutes per session: Not on file   ??? Stress: Not on file   Relationships   ??? Social connections:     Talks on  phone: Not on file     Gets together: Not on file     Attends religious service: Not on file     Active member of club or organization: Not on file     Attends meetings of clubs or organizations: Not on file     Relationship status: Not on file   ??? Intimate partner violence:     Fear of current or ex partner: Not on file     Emotionally abused: Not on file     Physically abused: Not on file     Forced sexual activity: Not on file   Other Topics Concern   ??? Not on file   Social History Narrative   ??? Not on file       No Known Allergies    Current Outpatient Medications   Medication Sig   ??? amLODIPine (NORVASC) 10 mg tablet TAKE 1 TABLET BY MOUTH DAILY FOR HIGH BLOOD PRESSURE   ??? aspirin-dipyridamole (AGGRENOX) 25-200 mg per SR capsule Take 1 Cap by mouth two (2) times a day.   ??? cephALEXin (KEFLEX) 500 mg capsule Take 1 Cap by mouth four (4) times daily for 5 days.   ??? losartan (COZAAR) 100 mg tablet TAKE 1 TABLET BY MOUTH EVERY DAY   ??? pregabalin (LYRICA) 100 mg capsule TAKE 1 CAPSULE BY MOUTH EVERY DAY   ??? atorvastatin (LIPITOR) 10 mg tablet TAKE 1 TABLET BY MOUTH DAILY   ??? atorvastatin (LIPITOR) 10 mg tablet TAKE 1 TABLET BY MOUTH DAILY   ??? nizatidine (AXID) 150 mg cap capsule Take 1 Cap by mouth as needed (GERD).   ??? MYRBETRIQ 50 mg ER tablet TAKE 1 TABLET BY MOUTH DAILY   ??? metFORMIN (GLUCOPHAGE) 500 mg  tablet TAKE 1 TABLET BY MOUTH DAILY WITH BREAKFAST   ??? polyethylene glycol (MIRALAX) 17 gram packet Take 17 g by mouth daily as needed (constipation).   ??? docusate sodium (STOOL SOFTENER) 100 mg capsule Take 100 mg by mouth daily. 1-3 softgels daily   ??? acetaminophen (TYLENOL ARTHRITIS PAIN) 650 mg TbER Take 650 mg by mouth daily as needed for Pain.   ??? losartan (COZAAR) 50 mg tablet TAKE 1 TABLET BY MOUTH DAILY   ??? polyethylene glycol (MIRALAX) 17 gram/dose powder MIX 17 GRAMS IN 8 OUNCES OF FLUID AND DRINK EVERY DAY AS DIRECTED   ??? ascorbic acid (VITAMIN C) 1,000 mg tablet Take 1,000 mg by mouth daily.   ??? multivitamin with iron (DAILY MULTI-VITAMINS/IRON) tablet Take 1 tablet by mouth daily.   ??? calcium-cholecalciferol, D3, (CALCIUM 600 + D) tablet Take 2 Tabs by mouth daily.     No current facility-administered medications for this visit.        Review of Systems   Constitutional: Negative for chills and fever.   Respiratory: Negative for shortness of breath and wheezing.    Cardiovascular: Negative for chest pain and palpitations.   Gastrointestinal: Negative for abdominal pain and diarrhea.       Visit Vitals  BP 166/82 (BP 1 Location: Left arm, BP Patient Position: Sitting)   Pulse 78   Temp 97.9 ??F (36.6 ??C) (Oral)   Resp 18   Ht 4\' 7"  (1.397 m)   Wt 122 lb (55.3 kg)   LMP 10/23/1978   SpO2 95%   BMI 28.36 kg/m??       Physical Exam  Vitals signs and nursing note reviewed.   Constitutional:       General: She is not  in acute distress.     Appearance: She is not diaphoretic.   Eyes:      General: No scleral icterus.     Conjunctiva/sclera: Conjunctivae normal.   Cardiovascular:      Rate and Rhythm: Normal rate and regular rhythm.      Heart sounds: Normal heart sounds. No murmur. No gallop.    Pulmonary:      Effort: Pulmonary effort is normal. No respiratory distress.      Breath sounds: Normal breath sounds. No wheezing.   Abdominal:      General: Bowel sounds are normal. There is no distension.       Palpations: Abdomen is soft.      Tenderness: There is no tenderness.   Lymphadenopathy:      Cervical: No cervical adenopathy.   Skin:     General: Skin is warm.   Neurological:      Mental Status: She is alert.   Psychiatric:         Mood and Affect: Affect normal.         Recent Results (from the past 12 hour(s))   AMB POC URINALYSIS DIP STICK AUTO W/O MICRO    Collection Time: 09/23/18  1:28 PM   Result Value Ref Range    Color (UA POC) Yellow     Clarity (UA POC) Clear     Glucose (UA POC) 1+ Negative    Bilirubin (UA POC) Negative Negative    Ketones (UA POC) Negative Negative    Specific gravity (UA POC) 1.025 1.001 - 1.035    Blood (UA POC) Negative Negative    pH (UA POC) 6.5 4.6 - 8.0    Protein (UA POC) Negative Negative    Urobilinogen (UA POC) 0.2 mg/dL 0.2 - 1    Nitrites (UA POC) Positive Negative    Leukocyte esterase (UA POC) Negative Negative     1. Dysuria  UA returned positive for nitrates  - AMB POC URINALYSIS DIP STICK AUTO W/O MICRO    2. History of CVA (cerebrovascular accident)  Medication refill.  No problems with this medication.  - aspirin-dipyridamole (AGGRENOX) 25-200 mg per SR capsule; Take 1 Cap by mouth two (2) times a day.  Dispense: 60 Cap; Refill: 3    3. Controlled type 2 diabetes mellitus without complication, without long-term current use of insulin (HCC)  We will get labs today.  I believe she is doing well with diet.  With what she can do.  No changes to medications at this time.  - LIPID PANEL; Future  - HEMOGLOBIN A1C WITH EAG; Future  - AMB POC URINE, MICROALBUMIN, SEMIQUANT (1 RESULT)    4. Hypertension, unspecified type  Blood pressure slightly elevated.  Patient asymptomatic.  Reports normal blood pressures at home.  Will recheck at her Medicare wellness follow-up.  - amLODIPine (NORVASC) 10 mg tablet; TAKE 1 TABLET BY MOUTH DAILY FOR HIGH BLOOD PRESSURE  Dispense: 90 Tab; Refill: 3    5. Urinary tract infection without hematuria, site unspecified  Past urine cultures  reviewed.  She has been resistant to ampicillin and tetracyclines.  Start Keflex and will send for urine culture.  - CULTURE, URINE; Future   - cephALEXin (KEFLEX) 500 mg capsule; Take 1 Cap by mouth four (4) times daily for 5 days.  Dispense: 20 Cap; Refill: 0    6. Parent refuses immunizations   I discussed in detail the benefits and risks of immunizations.  I answered patients questions  regarding immunizations.  I highly recommended patient be immunized given their risk factors.  Patient aware that immunizations can prevent them from contracting the illness along with protecting them from the downstream effects of that disease.  Patient also aware that immunizations prevents transmitting the disease to others.  Patient continues to refuse to have their vaccinations.      7. Vaginal dryness  If her urine cultures returned negative, she does complain of some vaginal dryness.  Try Replens 2-3 times a week.  She will see if this helps.      Follow-up and Dispositions    ?? Return in about 2 weeks (around 10/07/2018) for Medicare Wellness.           Salley Scarlet, MD, CAQSM, RMSK

## 2018-09-23 NOTE — Telephone Encounter (Signed)
Please call in scripts written on today 12.2.19 into [facility]. It seems there was a glitch in system during the time that these medications were prescribed

## 2018-09-23 NOTE — Patient Instructions (Signed)
Learning About Living Wills  What is a living will?    A living will is a legal form you use to write down the kind of care you want at the end of your life. It is used by the health professionals who will treat you if you aren't able to decide for yourself.  If you put your wishes in writing, your loved ones and others will know what kind of care you want. They won't need to guess. This can ease your mind and be helpful to others.  A living will is not the same as an estate or property will. An estate will explains what you want to happen with your money and property after you die.  Is a living will a legal document?  A living will is a legal document. Each state has its own laws about living wills. If you move to another state, make sure that your living will is legal in the state where you now live. Or you might use a universal form that has been approved by many states. This kind of form can sometimes be completed and stored online. Your electronic copy will then be available wherever you have a connection to the Internet. In most cases, doctors will respect your wishes even if you have a form from a different state.  ?? You don't need an attorney to complete a living will. But legal advice can be helpful if your state's laws are unclear, your health history is complicated, or your family can't agree on what should be in your living will.  ?? You can change your living will at any time. Some people find that their wishes about end-of-life care change as their health changes.  ?? In addition to making a living will, think about completing a medical power of attorney form. This form lets you name the person you want to make end-of-life treatment decisions for you (your "health care agent") if you're not able to. Many hospitals and nursing homes will give you the forms you need to complete a living will and a medical power of attorney.  ?? Your living will is used only if you can't make or communicate decisions  for yourself anymore. If you become able to make decisions again, you can accept or refuse any treatment, no matter what you wrote in your living will.  ?? Your state may offer an online registry. This is a place where you can store your living will online so the doctors and nurses who need to treat you can find it right away.  What should you think about when creating a living will?  Talk about your end-of-life wishes with your family members and your doctor. Let them know what you want. That way the people making decisions for you won't be surprised by your choices.  Think about these questions as you make your living will:  ?? Do you know enough about life support methods that might be used? If not, talk to your doctor so you know what might be done if you can't breathe on your own, your heart stops, or you're unable to swallow.  ?? What things would you still want to be able to do after you receive life-support methods? Would you want to be able to walk? To speak? To eat on your own? To live without the help of machines?  ?? If you have a choice, where do you want to be cared for? In your home? At a hospital or nursing home?  ??   Do you want certain religious practices performed if you become very ill?  ?? If you have a choice at the end of your life, where would you prefer to die? At home? In a hospital or nursing home? Somewhere else?  ?? Would you prefer to be buried or cremated?  ?? Do you want your organs to be donated after you die?  What should you do with your living will?  ?? Make sure that your family members and your health care agent have copies of your living will.  ?? Give your doctor a copy of your living will to keep in your medical record. If you have more than one doctor, make sure that each one has a copy.  ?? You may want to put a copy of your living will where it can be easily found.  Where can you learn more?  Go to InsuranceStats.cahttp://www.healthwise.net/GoodHelpConnections.   Enter [phone] in the search box to learn more about "Learning About Living Wills."  Current as of: January 21, 2018  Content Version: 12.2  ?? 2006-2019 [employer]. Care instructions adapted under license by [employer] (which disclaims liability or warranty for this information). If you have questions about a medical condition or this instruction, always ask your healthcare professional. [employer] disclaims any warranty or liability for your use of this information.         Learning About Durable Power of Attorney for Health Care  What is a durable power of attorney for health care?    A durable power of attorney for health care is one type of the legal forms called advance directives. It lets you decide who you want to make treatment decisions for you if you cannot speak or decide for yourself. The person you choose is called your health care agent.  Another type of advance directive is a living will. It lets you write down what kinds of treatment or life support you want or do not want.  What should you think about when choosing a health care agent?  Choose your health care agent carefully. This person may or may not be a family member.  Talk to the person before you make your final decision. Make sure he or she is comfortable with this responsibility.  It's a good idea to choose someone who:  ?? Is at least 82 years old.  ?? Knows you well and understands what makes life meaningful for you.  ?? Understands your religious and moral values.  ?? Will do what you want, not what he or she wants.  ?? Will be able to make difficult choices at a stressful time.  ?? Will be able to refuse or stop treatment, if that is what you would want, even if you could die.  ?? Will be firm and confident with health professionals if needed.  ?? Will ask questions to get necessary information.  ?? Lives near you or agrees to travel to you if needed.   Your family may help you make medical decisions while you can still be part of that process. But it is important to choose one person to be your health care agent in case you are not able to make decisions for yourself.  If you don't fill out the legal form and name a health care agent, the decisions your family can make may be limited.  Who will make decisions for you if you do not have a health care agent?  If you don't have a health  care agent or a living will, your family members may disagree about your medical care. And then some medical professionals who may not know you as well might have to make decisions for you. In some cases, a judge makes the decisions.  When you name a health care agent, it is very clear who has the power to make health decisions for you.  How do you name a health care agent?  You name your health care agent on a legal form. It is usually called a durable power of attorney for health care. Ask your hospital, state bar association, or office on aging where to find these forms.  You must sign the form to make it legal. Some states require you to get the form notarized. This means that a person called a notary public watches you sign the form and then he or she signs the form. Some states also require that two or more witnesses sign the form.  Be sure to tell your family members and doctors who your health care agent is.  Keep your forms in a safe place. But make sure that your loved ones know where the forms are. This could be in your desk where you keep other important papers. Make sure your doctor has a copy of your forms.  Where can you learn more?  Go to InsuranceStats.cahttp://www.healthwise.net/GoodHelpConnections.  Enter P737 in the search box to learn more about "Learning About Durable Power of Attorney for Health Care."  Current as of: January 21, 2018  Content Version: 12.2  ?? 2006-2019 [employer]. Care instructions adapted under  license by [employer] (which disclaims liability or warranty for this information). If you have questions about a medical condition or this instruction, always ask your healthcare professional. [employer] disclaims any warranty or liability for your use of this information.

## 2018-09-23 NOTE — Progress Notes (Signed)
Chelsea Schultz is a 84 y.o. female here today to address the following issues:  Chief Complaint   Patient presents with   ??? Diabetes     follow up   ??? Hypertension     follow up   ??? Dysuria     burning with urination for a couple weeks     Here for follow up on diabetes.    Lab Results   Component Value Date/Time    Hemoglobin A1c 6.9 (H) 08/13/2017 03:48 PM    Hemoglobin A1c 6.2 (H) 12/01/2016 04:10 PM    Hemoglobin A1c 6.0 (H) 07/21/2016 11:17 AM    Microalb/Creat ratio (ug/mg creat.) 6.6 12/01/2016 04:10 PM    LDL,Direct 87 08/13/2017 03:48 PM    LDL, calculated 53 07/21/2016 11:17 AM    Creatinine 0.77 04/22/2018 04:27 PM    Hemoglobin A1c, External 6.4 01/12/2015    Hemoglobin A1c, External 6.4 01/09/2015    Hemoglobin A1c, External 6.4 06/11/2014 10:39 AM         POC Glucose last 24hrs:   No components found for: [facility]    Preventive:   Influenza and Pneumovax: Refuses immunizations   Diabetic Retina screening:  Sees ophthalmology yearly    Diet and exercise:  Eats whatever she wants.  Tries to minimize grease and sugars. Does going through PT for hip and joint pain.     BP high today. At home, usually 130-140s/60-70s.     Past Medical History:   Diagnosis Date   ??? Arthritis    ??? CVA (cerebral infarction)    ??? Diabetes (HCC) 01/09/2015    Hemoglobin A1C 6.4 (per records, done at outside facility)   ??? GERD (gastroesophageal reflux disease)    ??? History of recurrent UTIs     on chronic antibiotic prophylaxis, followed by urology   ??? HLD (hyperlipidemia) 01/09/2015    Total Chol 141, Tri 49, HDL 59, LDL 72   ??? Hypertension    ??? Neuropathy    ??? Osteopetrosis    ??? Spinal stenosis      Past Surgical History:   Procedure Laterality Date   ??? HX ORTHOPAEDIC      Lumbar laminectomy   ??? HX ORTHOPAEDIC      Lumbar fusion     Social History     Socioeconomic History   ??? Marital status: WIDOWED     Spouse name: Not on file   ??? Number of children: Not on file   ??? Years of education: Not on file    ??? Highest education level: Not on file   Occupational History   ??? Not on file   Social Needs   ??? Financial resource strain: Not on file   ??? Food insecurity:     Worry: Not on file     Inability: Not on file   ??? Transportation needs:     Medical: Not on file     Non-medical: Not on file   Tobacco Use   ??? Smoking status: Never Smoker   ??? Smokeless tobacco: Never Used   Substance and Sexual Activity   ??? Alcohol use: No   ??? Drug use: No   ??? Sexual activity: Never   Lifestyle   ??? Physical activity:     Days per week: Not on file     Minutes per session: Not on file   ??? Stress: Not on file   Relationships   ??? Social connections:     Talks on  phone: Not on file     Gets together: Not on file     Attends religious service: Not on file     Active member of club or organization: Not on file     Attends meetings of clubs or organizations: Not on file     Relationship status: Not on file   ??? Intimate partner violence:     Fear of current or ex partner: Not on file     Emotionally abused: Not on file     Physically abused: Not on file     Forced sexual activity: Not on file   Other Topics Concern   ??? Not on file   Social History Narrative   ??? Not on file       No Known Allergies    Current Outpatient Medications   Medication Sig   ??? amLODIPine (NORVASC) 10 mg tablet TAKE 1 TABLET BY MOUTH DAILY FOR HIGH BLOOD PRESSURE   ??? aspirin-dipyridamole (AGGRENOX) 25-200 mg per SR capsule Take 1 Cap by mouth two (2) times a day.   ??? cephALEXin (KEFLEX) 500 mg capsule Take 1 Cap by mouth four (4) times daily for 5 days.   ??? losartan (COZAAR) 100 mg tablet TAKE 1 TABLET BY MOUTH EVERY DAY   ??? pregabalin (LYRICA) 100 mg capsule TAKE 1 CAPSULE BY MOUTH EVERY DAY   ??? atorvastatin (LIPITOR) 10 mg tablet TAKE 1 TABLET BY MOUTH DAILY   ??? atorvastatin (LIPITOR) 10 mg tablet TAKE 1 TABLET BY MOUTH DAILY   ??? nizatidine (AXID) 150 mg cap capsule Take 1 Cap by mouth as needed (GERD).   ??? MYRBETRIQ 50 mg ER tablet TAKE 1 TABLET BY MOUTH DAILY    ??? metFORMIN (GLUCOPHAGE) 500 mg tablet TAKE 1 TABLET BY MOUTH DAILY WITH BREAKFAST   ??? polyethylene glycol (MIRALAX) 17 gram packet Take 17 g by mouth daily as needed (constipation).   ??? docusate sodium (STOOL SOFTENER) 100 mg capsule Take 100 mg by mouth daily. 1-3 softgels daily   ??? acetaminophen (TYLENOL ARTHRITIS PAIN) 650 mg TbER Take 650 mg by mouth daily as needed for Pain.   ??? losartan (COZAAR) 50 mg tablet TAKE 1 TABLET BY MOUTH DAILY   ??? polyethylene glycol (MIRALAX) 17 gram/dose powder MIX 17 GRAMS IN 8 OUNCES OF FLUID AND DRINK EVERY DAY AS DIRECTED   ??? ascorbic acid (VITAMIN C) 1,000 mg tablet Take 1,000 mg by mouth daily.   ??? multivitamin with iron (DAILY MULTI-VITAMINS/IRON) tablet Take 1 tablet by mouth daily.   ??? calcium-cholecalciferol, D3, (CALCIUM 600 + D) tablet Take 2 Tabs by mouth daily.     No current facility-administered medications for this visit.        Review of Systems   Constitutional: Negative for chills and fever.   Respiratory: Negative for shortness of breath and wheezing.    Cardiovascular: Negative for chest pain and palpitations.   Gastrointestinal: Negative for abdominal pain and diarrhea.       Visit Vitals  BP 166/82 (BP 1 Location: Left arm, BP Patient Position: Sitting)   Pulse 78   Temp 97.9 ??F (36.6 ??C) (Oral)   Resp 18   Ht 4\' 7"  (1.397 m)   Wt 122 lb (55.3 kg)   LMP 10/23/1978   SpO2 95%   BMI 28.36 kg/m??       Physical Exam  Vitals signs and nursing note reviewed.   Constitutional:       General: She is not  in acute distress.     Appearance: She is not diaphoretic.   Eyes:      General: No scleral icterus.     Conjunctiva/sclera: Conjunctivae normal.   Cardiovascular:      Rate and Rhythm: Normal rate and regular rhythm.      Heart sounds: Normal heart sounds. No murmur. No gallop.    Pulmonary:      Effort: Pulmonary effort is normal. No respiratory distress.      Breath sounds: Normal breath sounds. No wheezing.   Abdominal:       General: Bowel sounds are normal. There is no distension.      Palpations: Abdomen is soft.      Tenderness: There is no tenderness.   Lymphadenopathy:      Cervical: No cervical adenopathy.   Skin:     General: Skin is warm.   Neurological:      Mental Status: She is alert.   Psychiatric:         Mood and Affect: Affect normal.         Recent Results (from the past 12 hour(s))   AMB POC URINALYSIS DIP STICK AUTO W/O MICRO    Collection Time: 09/23/18  1:28 PM   Result Value Ref Range    Color (UA POC) Yellow     Clarity (UA POC) Clear     Glucose (UA POC) 1+ Negative    Bilirubin (UA POC) Negative Negative    Ketones (UA POC) Negative Negative    Specific gravity (UA POC) 1.025 1.001 - 1.035    Blood (UA POC) Negative Negative    pH (UA POC) 6.5 4.6 - 8.0    Protein (UA POC) Negative Negative    Urobilinogen (UA POC) 0.2 mg/dL 0.2 - 1    Nitrites (UA POC) Positive Negative    Leukocyte esterase (UA POC) Negative Negative     1. Dysuria  UA returned positive for nitrates  - AMB POC URINALYSIS DIP STICK AUTO W/O MICRO    2. History of CVA (cerebrovascular accident)  Medication refill.  No problems with this medication.  - aspirin-dipyridamole (AGGRENOX) 25-200 mg per SR capsule; Take 1 Cap by mouth two (2) times a day.  Dispense: 60 Cap; Refill: 3    3. Controlled type 2 diabetes mellitus without complication, without long-term current use of insulin (HCC)  We will get labs today.  I believe she is doing well with diet.  With what she can do.  No changes to medications at this time.  - LIPID PANEL; Future  - HEMOGLOBIN A1C WITH EAG; Future  - AMB POC URINE, MICROALBUMIN, SEMIQUANT (1 RESULT)    4. Hypertension, unspecified type  Blood pressure slightly elevated.  Patient asymptomatic.  Reports normal blood pressures at home.  Will recheck at her Medicare wellness follow-up.  - amLODIPine (NORVASC) 10 mg tablet; TAKE 1 TABLET BY MOUTH DAILY FOR HIGH BLOOD PRESSURE  Dispense: 90 Tab; Refill: 3     5. Urinary tract infection without hematuria, site unspecified  Past urine cultures reviewed.  She has been resistant to ampicillin and tetracyclines.  Start Keflex and will send for urine culture.  - CULTURE, URINE; Future   - cephALEXin (KEFLEX) 500 mg capsule; Take 1 Cap by mouth four (4) times daily for 5 days.  Dispense: 20 Cap; Refill: 0    6. Parent refuses immunizations   I discussed in detail the benefits and risks of immunizations.  I answered patients questions  regarding immunizations.  I highly recommended patient be immunized given their risk factors.  Patient aware that immunizations can prevent them from contracting the illness along with protecting them from the downstream effects of that disease.  Patient also aware that immunizations prevents transmitting the disease to others.  Patient continues to refuse to have their vaccinations.      7. Vaginal dryness  If her urine cultures returned negative, she does complain of some vaginal dryness.  Try Replens 2-3 times a week.  She will see if this helps.      Follow-up and Dispositions    ?? Return in about 2 weeks (around 10/07/2018) for Medicare Wellness.           Salley Scarlet, MD, CAQSM, RMSK

## 2018-09-23 NOTE — Progress Notes (Signed)
Identified Patient with two Patient identifiers (Name and DOB). Two Patient Identifiers confirmed. Reviewed record in preparation for visit and have obtained necessary documentation.    Chief Complaint   Patient presents with   ??? Diabetes     follow up   ??? Hypertension     follow up   ??? Dysuria     burning with urination for a couple weeks       Visit Vitals  BP 166/82 (BP 1 Location: Left arm, BP Patient Position: Sitting)   Pulse 78   Temp 97.9 ??F (36.6 ??C) (Oral)   Resp 18   Ht 4' 7" (1.397 m)   Wt 122 lb (55.3 kg)   SpO2 95%   BMI 28.36 kg/m??       1. Have you been to the ER, urgent care clinic since your last visit?  Hospitalized since your last visit? Yes, Patient was admitted to [facility] for hip pain and inability to walk September 2019    2. Have you seen or consulted any other health care providers outside of the [facility] since your last visit?  Include any pap smears or colon screening. Yes, see above.

## 2018-09-23 NOTE — Addendum Note (Signed)
Addended by: Billey CoURNER, SHERYL C on: 09/23/2018 02:19 PM     Modules accepted: Orders

## 2018-09-24 LAB — LIPID PANEL
CHOL/HDL Ratio: 2.5 (ref 0.0–5.0)
Chol/HDL Ratio: 2.5 (ref 0.0–5.0)
Cholesterol, Total: 142 MG/DL (ref <200)
Cholesterol, total: 142 MG/DL (ref <200)
HDL Cholesterol: 56 MG/DL
HDL: 56 MG/DL
LDL Calculated: 71.4 MG/DL (ref 0–100)
LDL, calculated: 71.4 MG/DL (ref 0–100)
Triglyceride: 73 MG/DL (ref <150)
Triglycerides: 73 MG/DL (ref <150)
VLDL Cholesterol Calculated: 14.6 MG/DL
VLDL, calculated: 14.6 MG/DL

## 2018-09-24 LAB — HEMOGLOBIN A1C W/EAG
Hemoglobin A1C: 6.9 % — ABNORMAL HIGH (ref 4.0–5.6)
eAG: 151 mg/dL

## 2018-09-24 LAB — HEMOGLOBIN A1C WITH EAG
Est. average glucose: 151 mg/dL
Hemoglobin A1c: 6.9 % — ABNORMAL HIGH (ref 4.0–5.6)

## 2018-09-24 MED ORDER — MYRBETRIQ 50 MG TABLET,EXTENDED RELEASE
50 mg | ORAL_TABLET | ORAL | 3 refills | Status: AC
Start: 2018-09-24 — End: ?

## 2018-09-26 LAB — CULTURE, URINE
Colonies Counted: >100000
Colony Count: >100000

## 2018-11-19 ENCOUNTER — Encounter

## 2018-11-20 MED ORDER — PREGABALIN 100 MG CAP
100 mg | ORAL_CAPSULE | ORAL | 0 refills | Status: DC
Start: 2018-11-20 — End: 2018-11-30

## 2018-11-20 NOTE — Telephone Encounter (Signed)
Patient due for Medicare Wellness.  One month supply given.  Please have patient schedule a Medicare Wellness visit.

## 2018-11-28 ENCOUNTER — Encounter

## 2018-11-30 MED ORDER — PREGABALIN 100 MG CAP
100 mg | ORAL_CAPSULE | Freq: Every day | ORAL | 0 refills | Status: DC | PRN
Start: 2018-11-30 — End: 2019-01-01

## 2018-11-30 NOTE — Telephone Encounter (Signed)
Due for Medicare Wellness.  Please call patient to have an appointment for this

## 2018-12-31 ENCOUNTER — Encounter: Attending: Family Medicine | Primary: Family Medicine

## 2019-01-01 ENCOUNTER — Encounter

## 2019-01-01 MED ORDER — PREGABALIN 100 MG CAP
100 mg | ORAL_CAPSULE | ORAL | 0 refills | Status: DC
Start: 2019-01-01 — End: 2021-12-29

## 2019-01-01 NOTE — Telephone Encounter (Signed)
Patient due for Medicare Wellness visit.  Last month was told would be last fill.  Will give another 2 weeks worth.  Junious Dresser please have patient schedule a medicare wellness visit.  Thank you!

## 2019-05-05 ENCOUNTER — Encounter

## 2019-05-05 MED ORDER — DIPYRIDAMOLE-ASPIRIN SR 200 MG-25 MG MULTIPHASE 12 HR CAP
25-200 mg | ORAL_CAPSULE | ORAL | 3 refills | Status: AC
Start: 2019-05-05 — End: 2022-02-24

## 2019-06-01 ENCOUNTER — Encounter

## 2019-06-01 NOTE — Telephone Encounter (Signed)
Please call patient to schedule and schedule a virtual visit for more refills.

## 2019-07-11 ENCOUNTER — Encounter

## 2019-07-12 ENCOUNTER — Encounter

## 2019-07-12 MED ORDER — LOSARTAN 100 MG TAB
100 mg | ORAL_TABLET | ORAL | 0 refills | Status: DC
Start: 2019-07-12 — End: 2019-07-12

## 2019-07-12 MED ORDER — LOSARTAN 100 MG TAB
100 mg | ORAL_TABLET | ORAL | 0 refills | Status: AC
Start: 2019-07-12 — End: 2022-02-24

## 2019-07-12 NOTE — Telephone Encounter (Signed)
30 day supply sent to patient.  Informed now the 5th time over the past year they need an appt for more refills.  30 day supply sent.  Needs virtual appt with any provider.

## 2019-07-12 NOTE — Telephone Encounter (Signed)
Please let patient know this is their last refill as we have told them a few times now they need an appt.  This can be done virtually.  Please also ask them to check her home BP and HR prior to her appt with any provider in our office.

## 2019-07-29 NOTE — Telephone Encounter (Signed)
Please call patient.  Appt needed for more refills.  This can be done virtually.

## 2019-07-31 ENCOUNTER — Encounter

## 2019-07-31 NOTE — Telephone Encounter (Signed)
Please call patient.  Appt needed for more refills.  This can be done virtually.

## 2019-08-11 ENCOUNTER — Encounter

## 2019-08-12 NOTE — Telephone Encounter (Signed)
Please call patient for appt for more refills.  This can be virtual with any provider.

## 2019-08-24 ENCOUNTER — Encounter

## 2019-08-25 NOTE — Telephone Encounter (Signed)
Please call patient for appt for more refills.  This can be virtual with any provider.

## 2019-09-10 ENCOUNTER — Encounter

## 2019-09-10 NOTE — Telephone Encounter (Signed)
Please call patient for appt for more refills. ??This can be virtual with any provider.

## 2020-05-20 ENCOUNTER — Encounter

## 2020-05-21 NOTE — Telephone Encounter (Signed)
Please call patient for appt for more refills. ??This can be virtual with any provider.??

## 2020-05-21 NOTE — Telephone Encounter (Signed)
Called numbers in chart - no answer. Message left to contact our office and schedule an appointment. Patient has not been seen since 2019 and we can no longer provide refills unless she has an appointment scheduled. This appointment can be with any doctor.

## 2020-08-02 ENCOUNTER — Ambulatory Visit: Payer: MEDICARE | Attending: Student in an Organized Health Care Education/Training Program | Primary: Family Medicine

## 2020-08-02 NOTE — Telephone Encounter (Signed)
Daughter called to cancel the appointment of today.   Said she never received any link from the doctor.  Also said they were still at the other doctor's appointment of today anyway.      [phone] and patient, cm  UTI?, urinary incontience,  covid no, cm

## 2020-08-03 ENCOUNTER — Telehealth
Admit: 2020-08-03 | Payer: MEDICARE | Attending: Student in an Organized Health Care Education/Training Program | Primary: Family Medicine

## 2020-08-03 ENCOUNTER — Telehealth: Attending: Student in an Organized Health Care Education/Training Program | Primary: Family Medicine

## 2020-08-03 DIAGNOSIS — R300 Dysuria: Secondary | ICD-10-CM

## 2020-08-03 DIAGNOSIS — N3000 Acute cystitis without hematuria: Secondary | ICD-10-CM

## 2020-08-03 MED ORDER — NITROFURANTOIN (25% MACROCRYSTAL FORM) 100 MG CAP
100 mg | ORAL_CAPSULE | Freq: Two times a day (BID) | ORAL | 0 refills | Status: AC
Start: 2020-08-03 — End: 2020-08-08

## 2020-08-03 NOTE — Progress Notes (Signed)
Progress Notes by Noemi Chapel, MD at 08/03/20 1600                Author: Noemi Chapel, MD  Service: --  Author Type: Resident       Filed: 08/03/20 1715  Encounter Date: 08/03/2020  Status: Signed          Editor: Noemi Chapel, MD (Resident)                  Chelsea Schultz   86 y.o. female   05/28/1926   [address]   [place] [address]   [phone]       [name]:     Telemedicine Progress Note   Noemi Chapel, MD           Encounter Date and Time: August 03, 2020 at 4:26 PM      Consent:   She and/or the health care decision maker is aware that that she  may receive a bill for this telephone service, depending on her insurance coverage, and has provided verbal consent to proceed:  Yes      Chief Complaint      Patient presents with      ?  Dysuria            History of Present Illness         Chelsea Schultz is a 86 y.o.  female was evaluated by synchronous (real-time) audio-video technology from home, through the MyChart Patient Portal.      H/o frequent UTI and chronic urinary retention. Finished last abx in august for UTI, now experiencing dysuria, foul smelling urine again for 2 days.       Followed previously by Dr. Vickie Epley - Urogynecologist here in [place] 3 years ago. Now seeing specialist in [place], [place] as she is living there primarily with other daughter. Currently visit daughter in [place].       Denies fever, acute back pain, flank pain, N/V.         Review of Systems         Review of Systems    Gastrointestinal: Negative for nausea and vomiting.    Genitourinary: Positive for dysuria. Negative for flank pain.          Vitals/Objective:            General:  alert, cooperative, no distress      Mental  status:  mental status: alert, oriented to person, place, and time, normal mood, behavior, speech, dress, motor activity, and thought processes      Resp:  resp: normal effort and no respiratory distress      Neuro:  neuro: no gross  deficits      Skin:  skin: no discoloration or lesions of concern on visible areas         Due to this being a TeleHealth evaluation, many elements of the physical examination are unable to be assessed.         Reviewed: last UCx Ecoli susceptible to macrobid in 06/2020 from [facility] system in [place]        Assessment and Plan:                ICD-10-CM  ICD-9-CM        1.  Acute cystitis without hematuria   N30.00  595.0  CULTURE, URINE            nitrofurantoin, macrocrystal-monohydrate, (MACROBID) 100 mg capsule  2.  Dysuria   R30.0  788.1  CULTURE, URINE            nitrofurantoin, macrocrystal-monohydrate, (MACROBID) 100 mg capsule      3.  Foul smelling urine   R82.90  791.9  nitrofurantoin, macrocrystal-monohydrate, (MACROBID) 100 mg capsule            Acute cystitis: Sx not concerning for pyelonephritis at this time   - Start Macrobid per last urine susceptibilities    - Lab already closed today, will come for urine culture tomorrow          Time spent in direct conversation with the patient to include medical condition(s) discussed, assessment and treatment plan:          We discussed the expected course, resolution and complications of the diagnosis(es) in detail.  Medication risks, benefits, costs, interactions, and alternatives were discussed as indicated.  I advised  her to contact the office if her condition worsens, changes or fails to improve as anticipated.  She expressed understanding with the diagnosis(es) and plan. Patient understands that this encounter was a temporary measure, and the importance of further follow up and examination was emphasized.   Patient verbalized understanding.         Patient informed to follow up: if worsening or not resolving symptoms   Electronically Signed: Noemi Chapel, MD      Providers location when delivering service (clinic, hospital, home): clinic      CPT Codes [phone] for Established Patients may apply to this Telehealth Visit.  POS code: 2.  Modifier  GT         Pursuant to the emergency declaration under the [place] Act and the [employer], 1135 waiver authority and the [employer] Act,  this Virtual  Visit was conducted, with patient's consent, to reduce the patient's risk of exposure to COVID-19 and provide continuity of care for an established patient.       Services were provided through a video synchronous discussion virtually to substitute for in-person clinic visit.     History         Patients past medical, surgical and family histories were reviewed.        Past Medical History:      Diagnosis  Date      ?  Arthritis        ?  CVA (cerebral infarction)        ?  Diabetes (HCC)  01/09/2015        Hemoglobin A1C 6.4 (per records, done at outside facility)      ?  GERD (gastroesophageal reflux disease)        ?  History of recurrent UTIs          on chronic antibiotic prophylaxis, followed by urology      ?  HLD (hyperlipidemia)  01/09/2015        Total Chol 141, Tri 49, HDL 59, LDL 72      ?  Hypertension        ?  Neuropathy        ?  Osteopetrosis        ?  Spinal stenosis              Past Surgical History:      Procedure  Laterality  Date      ?  HX ORTHOPAEDIC            Lumbar laminectomy      ?  HX ORTHOPAEDIC            Lumbar fusion            Family History      Problem  Relation  Age of Onset      ?  Diabetes  Mother        ?  Stroke  Mother        ?  Hypertension  Mother        ?  Hypertension  Father        ?  Stroke  Father        ?  Cancer  Sister        ?  Stroke  Sister              Social History            Socioeconomic History      ?  Marital status:  WIDOWED          Spouse name:  Not on file      ?  Number of children:  Not on file      ?  Years of education:  Not on file      ?  Highest education level:  Not on file      Occupational History      ?  Not on file      Tobacco Use      ?  Smoking status:  Never Smoker      ?  Smokeless tobacco:  Never Used      Substance  and Sexual Activity      ?  Alcohol use:  No      ?  Drug use:  No      ?  Sexual activity:  Never      Other Topics  Concern      ?  Not on file      Social History Narrative      ?  Not on file            Social Determinants of Health            Financial Resource Strain:       ?  Difficulty of Paying Living Expenses:       Food Insecurity:       ?  Worried About Programme researcher, broadcasting/film/video in the Last Year:       ?  Barista in the Last Year:       Transportation Needs:       ?  Freight forwarder (Medical):       ?  Lack of Transportation (Non-Medical):       Physical Activity:       ?  Days of Exercise per Week:       ?  Minutes of Exercise per Session:       Stress:       ?  Feeling of Stress :       Social Connections:       ?  Frequency of Communication with Friends and Family:       ?  Frequency of Social Gatherings with Friends and Family:       ?  Attends Religious Services:       ?  Active Member of Clubs or Organizations:       ?  Attends Banker Meetings:       ?  Marital Status:       Intimate Partner Violence:       ?  Fear of Current or Ex-Partner:       ?  Emotionally Abused:       ?  Physically Abused:       ?  Sexually Abused:             Patient Active Problem List      Diagnosis  Code      ?  HTN (hypertension)  I10      ?  Urgency incontinence  N39.41      ?  Recurrent UTI  N39.0      ?  Acute ischemic stroke (HCC)  I63.9      ?  Advance directive discussed with patient  Z71.89      ?  At risk for falling  Z91.81      ?  Pulmonary fibrosis (HCC)  J84.10      ?  Type 2 diabetes with nephropathy (HCC)  E11.21      ?  Abnormal foot finding  R29.91      ?  Other constipation  K59.09      ?  Type 2 diabetes mellitus with diabetic neuropathy (HCC)  E11.40                    Current Medications/Allergies        Medications and Allergies reviewed:      Current Outpatient Medications      Medication  Sig  Dispense  Refill      ?  nitrofurantoin, macrocrystal-monohydrate, (MACROBID) 100  mg capsule  Take 1 Capsule by mouth two (2) times a day for 5 days.  10 Capsule  0      ?  losartan (COZAAR) 100 mg tablet  TAKE 1 TABLET BY MOUTH EVERY DAY  30 Tab  0      ?  aspirin-dipyridamole (AGGRENOX) 25-200 mg per SR capsule  TAKE ONE CAPSULE BY MOUTH TWICE DAILY  60 Cap  3      ?  pregabalin (LYRICA) 100 mg capsule  TAKE 1 CAPSULE BY MOUTH DAILY AS NEEDED FOR NEUROPATHY  14 Cap  0      ?  MYRBETRIQ 50 mg ER tablet  TAKE 1 TABLET BY MOUTH DAILY  90 Tab  3      ?  amLODIPine (NORVASC) 10 mg tablet  TAKE 1 TABLET BY MOUTH DAILY FOR HIGH BLOOD PRESSURE  90 Tab  3      ?  atorvastatin (LIPITOR) 10 mg tablet  TAKE 1 TABLET BY MOUTH DAILY  90 Tab  4      ?  atorvastatin (LIPITOR) 10 mg tablet  TAKE 1 TABLET BY MOUTH DAILY  90 Tab  1      ?  nizatidine (AXID) 150 mg cap capsule  Take 1 Cap by mouth as needed (GERD).  90 Cap  0      ?  metFORMIN (GLUCOPHAGE) 500 mg tablet  TAKE 1 TABLET BY MOUTH DAILY WITH BREAKFAST  90 Tab  1      ?  polyethylene glycol (MIRALAX) 17 gram packet  Take 17 g by mouth daily as needed (constipation).          ?  docusate sodium (STOOL SOFTENER) 100 mg capsule  Take 100 mg by mouth daily. 1-3 softgels daily          ?  acetaminophen (TYLENOL  ARTHRITIS PAIN) 650 mg TbER  Take 650 mg by mouth daily as needed for Pain.          ?  polyethylene glycol (MIRALAX) 17 gram/dose powder  MIX 17 GRAMS IN 8 OUNCES OF FLUID AND DRINK EVERY DAY AS DIRECTED  527 g  0      ?  ascorbic acid (VITAMIN C) 1,000 mg tablet  Take 1,000 mg by mouth daily.          ?  multivitamin with iron (DAILY MULTI-VITAMINS/IRON) tablet  Take 1 tablet by mouth daily.          ?  calcium-cholecalciferol, D3, (CALCIUM 600 + D) tablet  Take 2 Tabs by mouth daily.  60 Tab  12           No Known Allergies

## 2020-08-03 NOTE — Progress Notes (Signed)
[facility] Attending Addendum:  Dr. Michaela Varys, MD,  the patient and I were not physically present during this encounter.  The resident and I are concurrently monitoring the patient care through appropriate telecommunication technology.  I discussed the findings, assessment and plan with the resident and agree with the resident's findings and plan as documented in the resident's note.      Jaanai Salemi, MD

## 2020-08-04 ENCOUNTER — Encounter: Primary: Family Medicine

## 2020-08-06 ENCOUNTER — Encounter

## 2020-08-06 NOTE — Progress Notes (Signed)
Ucx no growth - collected after antibiotics started

## 2020-08-08 LAB — CULTURE, URINE
Culture result:: NO GROWTH
Culture: NO GROWTH

## 2021-05-08 ENCOUNTER — Emergency Department (HOSPITAL_COMMUNITY): Payer: Medicare Other

## 2021-05-08 ENCOUNTER — Emergency Department (HOSPITAL_COMMUNITY)
Admission: EM | Admit: 2021-05-08 | Discharge: 2021-05-08 | Disposition: A | Discharge status: Home / Self Care | Payer: Medicare Other | Attending: Emergency Medicine | Admitting: Emergency Medicine

## 2021-05-08 ENCOUNTER — Other Ambulatory Visit: Payer: Self-pay

## 2021-05-08 DIAGNOSIS — R531 Weakness: Secondary | ICD-10-CM | POA: Insufficient documentation

## 2021-05-08 LAB — URINALYSIS, ROUTINE W REFLEX MICROSCOPIC
Bilirubin Urine: NEGATIVE
Glucose, UA: 50 mg/dL — AB
Hgb urine dipstick: NEGATIVE
Ketones, ur: NEGATIVE mg/dL
Leukocytes,Ua: NEGATIVE
Nitrite: NEGATIVE
Protein, ur: NEGATIVE mg/dL
Specific Gravity, Urine: 1.009 (ref 1.005–1.030)
pH: 5 (ref 5.0–8.0)

## 2021-05-08 LAB — CBC WITH DIFFERENTIAL/PLATELET
Abs Immature Granulocytes: 0.04 10*3/uL (ref 0.00–0.07)
Basophils Absolute: 0.1 10*3/uL (ref 0.0–0.1)
Basophils Relative: 1 %
Eosinophils Absolute: 0.2 10*3/uL (ref 0.0–0.5)
Eosinophils Relative: 3 %
HCT: 39.2 % (ref 36.0–46.0)
Hemoglobin: 12.5 g/dL (ref 12.0–15.0)
Immature Granulocytes: 1 %
Lymphocytes Relative: 26 %
Lymphs Abs: 2 10*3/uL (ref 0.7–4.0)
MCH: 28.2 pg (ref 26.0–34.0)
MCHC: 31.9 g/dL (ref 30.0–36.0)
MCV: 88.5 fL (ref 80.0–100.0)
Monocytes Absolute: 1 10*3/uL (ref 0.1–1.0)
Monocytes Relative: 13 %
Neutro Abs: 4.5 10*3/uL (ref 1.7–7.7)
Neutrophils Relative %: 56 %
Platelets: 232 10*3/uL (ref 150–400)
RBC: 4.43 MIL/uL (ref 3.87–5.11)
RDW: 12.8 % (ref 11.5–15.5)
WBC: 7.8 10*3/uL (ref 4.0–10.5)
nRBC: 0 % (ref 0.0–0.2)

## 2021-05-08 LAB — COMPREHENSIVE METABOLIC PANEL
ALT: 15 U/L (ref 0–44)
AST: 21 U/L (ref 15–41)
Albumin: 3.4 g/dL — ABNORMAL LOW (ref 3.5–5.0)
Alkaline Phosphatase: 57 U/L (ref 38–126)
Anion gap: 4 — ABNORMAL LOW (ref 5–15)
BUN: 13 mg/dL (ref 8–23)
CO2: 26 mmol/L (ref 22–32)
Calcium: 10.2 mg/dL (ref 8.9–10.3)
Chloride: 108 mmol/L (ref 98–111)
Creatinine, Ser: 0.68 mg/dL (ref 0.44–1.00)
GFR, Estimated: >60 mL/min (ref >60)
Glucose, Bld: 141 mg/dL — ABNORMAL HIGH (ref 70–99)
Potassium: 4.3 mmol/L (ref 3.5–5.1)
Sodium: 138 mmol/L (ref 135–145)
Total Bilirubin: 0.6 mg/dL (ref 0.3–1.2)
Total Protein: 6.7 g/dL (ref 6.5–8.1)

## 2021-05-08 LAB — CBG MONITORING, ED: Glucose-Capillary: 149 mg/dL — ABNORMAL HIGH (ref 70–99)

## 2021-05-08 MED ORDER — SODIUM CHLORIDE 0.9 % IV SOLN: INTRAVENOUS | Status: DC

## 2021-05-08 NOTE — ED Triage Notes (Signed)
Pt arrives via EMS with complaints of weakness X1 day and right knee pain X3 days

## 2021-05-08 NOTE — ED Provider Notes (Signed)
[facility] EMERGENCY DEPARTMENT Provider Note   CSN: [id number] Arrival date & time: 05/08/21  1202     History Chief Complaint  Patient presents with   Weakness    Amy Benjamin is a 87 y.o. female.  HPI Patient presents via EMS after an episode of weakness.  No complete syncope, no fall.  She notes that she has been weak for several days.  She was with family members today, and seemingly with noticed weakness was sent here for evaluation.  EMS reports the patient was awake and alert, mildly tachycardic with orthostatic positioning, but otherwise hemodynamically unremarkable.  EMS reports sinus rhythm, rate 70s on monitor in transport. Patient denies pain, current dyspnea, fever.  She states that she has been generally well.  No explicit time for onset weakness.    Past medical: TIA Social: Lives with family, non-smoker  OB History   No obstetric history on file.     No family history on file.     Home Medications Prior to Admission medications   Not on File    Allergies    Patient has no allergy information on record.  Review of Systems   Review of Systems  Constitutional:        Per HPI, otherwise negative  HENT:         Per HPI, otherwise negative  Respiratory:         Per HPI, otherwise negative  Cardiovascular:        Per HPI, otherwise negative  Gastrointestinal:  Negative for vomiting.  Endocrine:       Negative aside from HPI  Genitourinary:        Neg aside from HPI   Musculoskeletal:        Per HPI, otherwise negative  Skin: Negative.   Neurological:  Positive for weakness. Negative for syncope.   Physical Exam Updated Vital Signs BP (!) 160/70   Pulse 75   Temp 98.7 F (37.1 C)   Resp 14   Ht 4\' 11"  (1.499 m)   Wt 56.7 kg   SpO2 97%   BMI 25.25 kg/m   Physical Exam Vitals and nursing note reviewed.  Constitutional:      General: She is not in acute distress.    Appearance: She is well-developed.  HENT:      Head: Normocephalic and atraumatic.  Eyes:     Conjunctiva/sclera: Conjunctivae normal.  Cardiovascular:     Rate and Rhythm: Normal rate and regular rhythm.  Pulmonary:     Effort: Pulmonary effort is normal. No respiratory distress.     Breath sounds: Normal breath sounds. No stridor.  Abdominal:     General: There is no distension.  Skin:    General: Skin is warm and dry.  Neurological:     Mental Status: She is alert and oriented to person, place, and time.     Cranial Nerves: No cranial nerve deficit.    ED Results / Procedures / Treatments   Labs (all labs ordered are listed, but only abnormal results are displayed) Labs Reviewed  COMPREHENSIVE METABOLIC PANEL - Abnormal; Notable for the following components:      Result Value   Glucose, Bld 141 (*)    Albumin 3.4 (*)    Anion gap 4 (*)    All other components within normal limits  URINALYSIS, ROUTINE W REFLEX MICROSCOPIC - Abnormal; Notable for the following components:   Color, Urine STRAW (*)    Glucose, UA 50 (*)  All other components within normal limits  CBG MONITORING, ED - Abnormal; Notable for the following components:   Glucose-Capillary 149 (*)    All other components within normal limits  CBC WITH DIFFERENTIAL/PLATELET    EKG EKG Interpretation  Date/Time:  Sunday May 08 2021 12:11:55 EDT Ventricular Rate:  76 PR Interval:  146 QRS Duration: 77 QT Interval:  377 QTC Calculation: 424 R Axis:   53 Text Interpretation: Sinus rhythm Borderline T abnormalities, inferior leads Abnormal ECG Confirmed by Gerhard Munch [phone]) on 05/08/2021 2:48:35 PM  Radiology CT Head Wo Contrast  Result Date: 05/08/2021 CLINICAL DATA:  Weakness for 1 day, right knee pain for 3 days EXAM: CT HEAD WITHOUT CONTRAST TECHNIQUE: Contiguous axial images were obtained from the base of the skull through the vertex without intravenous contrast. COMPARISON:  None. FINDINGS: Brain: Confluent hypodensities are seen throughout  the periventricular and subcortical white matter, as well as hypodensities within the central pons, are consistent with age-indeterminate small vessel ischemic changes, favor chronic. No other signs of acute infarct or hemorrhage. There is mild diffuse cerebral atrophy. Prominence of the lateral ventricles is out of proportion to the degree of cerebral atrophy and could signify underlying normal pressure hydrocephalus. No acute extra-axial fluid collections. No mass effect. Vascular: No hyperdense vessel or unexpected calcification. Skull: Normal. Negative for fracture or focal lesion. Sinuses/Orbits: No acute finding. Other: None. IMPRESSION: 1. Hypodensities throughout the periventricular white matter, subcortical white matter, and central pons, favor chronic small vessel ischemic change. 2. Otherwise no acute intracranial process. 3. Prominence of the lateral ventricles out of proportion to the degree of cerebral atrophy, which could signify underlying normal pressure hydrocephalus. Electronically Signed   By: Sharlet Salina M.D.   On: 05/08/2021 15:14   DG Chest Port 1 View  Result Date: 05/08/2021 CLINICAL DATA:  Weakness.  Patient does not feel right. EXAM: PORTABLE CHEST 1 VIEW COMPARISON:  None. FINDINGS: The heart, hila, and mediastinum are unremarkable. Calcified atherosclerosis in the aortic arch. No pneumothorax. The right hemidiaphragm is elevated. Mild opacity in the right lateral lung base. No other focal infiltrates. IMPRESSION: Mild opacity in the lateral right lung base may represent atelectasis. Early infiltrate considered less likely. No other abnormalities. Electronically Signed   By: Gerome Sam III M.D   On: 05/08/2021 13:19    Procedures Procedures   Medications Ordered in ED Medications  0.9 %  sodium chloride infusion ( Intravenous New Bag/Given 05/08/21 1426)    ED Course  I have reviewed the triage vital signs and the nursing notes.  Pertinent labs & imaging results  that were available during my care of the patient were reviewed by me and considered in my medical decision making (see chart for details).  Cardiac 70 sinus normal Pulse ox 99% room air normal   EMS rhythm strip, sinus rhythm, 74, unremarkable  Patient in no distress on repeat exam, calm, speaking clearly.  She is now coming by daughter.  Daughter notes that earlier in the day the patient had decreased level responsiveness compared to baseline.  She voices concern for possible TIA as the patient has a history of this.  She states the patient is otherwise currently interacting in a typical manner.  Daughter also notes patient has a history of recurrent urinary tract infection.  3:43 PM Patient in no distress, hemodynamically unremarkable, remaining labs, CT unremarkable.  She has no complaints.  We discussed admission for additional monitoring, management versus close outpatient follow-up and this latter  option is preferred, reasonable.  MDM Rules/Calculators/A&P Elderly female presents with weakness.  Here patient is awake and alert, complains of generalized weakness, denies pain, including knee pain which she had complained about earlier.  She is afebrile, vital signs unremarkable.  Patient eventually joined by family members who note patient has had decreased interactivity over the past day.   Labs unremarkable, urinalysis unremarkable.  Low suspicion for sustained arrhythmia given no change on monitor here for several hours, no evidence for acute intracranial abnormalities and the patient has no focal neurologic complaints.  No evidence of bacteremia, sepsis, no history of urinary tract infection.   Final Clinical Impression(s) / ED Diagnoses Final diagnoses:  Weakness     Gerhard Munch, MD 05/08/21 [phone]

## 2021-05-08 NOTE — Discharge Instructions (Addendum)
As discussed, your evaluation today has been largely reassuring.  But, it is important that you monitor your condition carefully, and do not hesitate to return to the ED if you develop new, or concerning changes in your condition. ? ?Otherwise, please follow-up with your physician for appropriate ongoing care. ? ?

## 2021-05-10 ENCOUNTER — Other Ambulatory Visit: Payer: Self-pay

## 2021-11-14 NOTE — Progress Notes (Signed)
Formatting of this note is different from the original.  [facility]     Chief Complaint   Patient presents with   ? Follow-up     Hospital discharged 01/20     HPI:   Patient is here with her daughter for hospital discharge follow-up.  She was hospitalized for generalized weakness.  Noted to have constipation.  Found to have asymptomatic bacteriuria, not treated.  Constipation improved before hospital discharge.  Patient is currently taking Colace 100 mg once a day and MiraLAX every other day.  She does not have a bowel movement every day.  Her stools are not soft.  She reports that she does not strain because she was still is not good for her.  No abdominal bloating or abdominal pain.  She feels that her legs are still a bit weak.  Her daughter reports that she has good days where she has more energy and strength where she will walk and on other days she feels too tired.  She does not have any urinary frequency or dysuria.  No abdominal pain.  Her daughter noticed that whenever she has the twice weekly Vagifem, the next day she seems to have fatigue. Wonders if this a side effect of the med.     ROS: See HPI.   PMFSH:   Past medical history, family history, and social history reviewed in chart today by me.   Current Outpatient Medications Ordered in Epic   Medication Sig Dispense Refill   ? acetaminophen (TYLENOL) 500 MG tablet Take 500 mg by mouth every 8 (eight) hours as needed for Pain     ? amLODIPine (NORVASC) 10 MG tablet Take 1 tablet (10 mg total) by mouth at bedtime 90 tablet 3   ? ascorbic acid, vitamin C, (VITAMIN C) 500 MG tablet Take 500 mg by mouth 2 (two) times daily     ? aspirin 81 MG EC tablet Take 1 tablet (81 mg total) by mouth once daily 30 tablet 11   ? atorvastatin (LIPITOR) 10 MG tablet Take 1 tablet (10 mg total) by mouth nightly 90 tablet 3   ? carvediloL (COREG) 3.125 MG tablet TAKE 1 TABLET(3.125 MG) BY MOUTH TWICE DAILY WITH MEALS (Patient taking differently: Take  3.125 mg by mouth 2 (two) times daily with meals) 180 tablet 3   ? cholecalciferol (VITAMIN D3) 1000 unit tablet Take 1,000 Units by mouth once daily     ? diclofenac (VOLTAREN) 1 % topical gel Apply 2 g topically 4 (four) times daily     ? docusate (COLACE) 100 MG capsule Take 100 mg by mouth once daily       ? [START ON 12/01/2021] estradioL (VAGIFEM) 10 mcg vaginal tablet Place 1 tablet (10 mcg total) vaginally twice a week 8 tablet 11   ? famotidine (PEPCID) 40 MG tablet Take 1 tablet (40 mg total) by mouth once daily as needed for Heartburn 90 tablet 3   ? fluticasone propionate (FLONASE) 50 mcg/actuation nasal spray Place 1 spray into both nostrils 2 (two) times daily 16 g 6   ? losartan (COZAAR) 50 MG tablet Take 1 tablet (50 mg total) by mouth every 12 (twelve) hours 180 tablet 3   ? metFORMIN (GLUCOPHAGE) 500 MG tablet Take 1 tablet (500 mg total) by mouth 2 (two) times daily with meals 180 tablet 3   ? mirabegron (MYRBETRIQ) 50 mg ER tablet Take 1 tablet (50 mg total) by mouth once daily 90 tablet 3   ?  polyethylene glycol (MIRALAX) powder Take 17 g by mouth once daily Mix in 4-8ounces of fluid prior to taking.        ? pregabalin (LYRICA) 50 MG capsule Take 1 capsule (50 mg total) by mouth once daily 90 capsule 3     No current Epic-ordered facility-administered medications on file.     PHYSICAL EXAM:   BP 120/70   Pulse 70   SpO2 95%    Gen: NAD, pleasant, cooperative   Lungs: normal work of breathing   Abdomen: soft, nontender to palpation, nondistended, NABS, no organomegaly   Skin: warm, dry, no rashes or cyanosis, normal capillary refill   Neuro: Awake, alert, sitting on wheelchair    ASSESSMENT/PLAN:   1. Constipation, unspecified constipation type  Hospital follow-up for generalized weakness.  Found to have constipation hospitalized.  Still seems to be an issue.  Recommended increasing MiraLAX to once a day.  Continue Colace 100 mg once a day.  If needed, can increase Colace to 100 mg twice daily.   Hospital did place referral for home health PT.  Patient is not sure if she wants to see with this.  Family already has information if she changes her mind.  We discussed asymptomatic bacteriuria and lack of data showing the need for antibiotic therapy.  As long as patient is not having any urinary symptoms, we can hold off on antibiotic therapy.    2. Type 2 diabetes with nephropathy (CMS-HCC)  Due for hemoglobin A1c.  Is on metformin currently.  - Hemoglobin A1C    3. Hyponatremia  Noted on labs during hospitalization.  We will recheck sodium.  - Sodium    -There were no barriers to communication. Patient to call with any questions or concerns.    -Patient educated regarding diagnosis, treatment plan, medications and possible side effects.  -Patient educated regarding emergent signs and symptoms and if develops, should notify our office immediately or seek emergent medical attention.  -Patient voiced understanding and agreement with treatment plan.  -Dictation was made using voice recognition software. Any dictation errors are unintentional.    Shawna Orleans, MD   Family Medicine  Electronically signed by Shawna Orleans, MD at 11/14/2021  4:04 PM EST

## 2021-12-22 ENCOUNTER — Encounter: Payer: MEDICARE | Attending: Student in an Organized Health Care Education/Training Program | Primary: Family Medicine

## 2021-12-29 ENCOUNTER — Ambulatory Visit
Admit: 2021-12-29 | Discharge: 2021-12-29 | Payer: MEDICARE | Attending: Student in an Organized Health Care Education/Training Program | Primary: Student in an Organized Health Care Education/Training Program

## 2021-12-29 ENCOUNTER — Ambulatory Visit: Attending: Student in an Organized Health Care Education/Training Program | Primary: Family Medicine

## 2021-12-29 DIAGNOSIS — R52 Pain, unspecified: Secondary | ICD-10-CM

## 2021-12-29 DIAGNOSIS — E1121 Type 2 diabetes mellitus with diabetic nephropathy: Secondary | ICD-10-CM

## 2021-12-29 DIAGNOSIS — N390 Urinary tract infection, site not specified: Secondary | ICD-10-CM

## 2021-12-29 MED ORDER — PREGABALIN 100 MG CAP
100 mg | ORAL_CAPSULE | ORAL | 1 refills | Status: AC
Start: 2021-12-29 — End: ?

## 2021-12-29 NOTE — Progress Notes (Signed)
Progress  Notes by Carmina Miller, MD at 12/29/21 1600                Author: Carmina Miller, MD  Service: --  Author Type: Resident       Filed: 02/10/22 1143  Encounter Date: 12/29/2021  Status: Signed          Editor: Carmina Miller, MD (Resident)                    [address]   [place], [place] [address]    Office [phone], Fax [phone]        Subjective:          Chief Complaint       Patient presents with        ?  Establish Care             #Urinary incontinence and recurrent UTI's per dtr. Abx started Sunday keflex 500 mg - awaiting for urine culture , has UROGYN follow up next month   #Bilateral foot pain -   #Buttocks pain "soreness"   #Diabetes nutrition per dtr               HPI:   CHELSEI MCCHESNEY is a 88 y.o. female with a history of T2DM, recurrent UTIs that presents for:      Recently returning from [facility]. Came back last week.       Recurrent UTI:    Has been going on for years, now having 1-2 UTIs every month. Previously on antibiotic prophylaxis, not anymore.    Urgent Care on Saturday, started Keflex.    Decreased burning with Keflex course. Has 2 days left on 7 days course.    Has previously been admitted for sepsis 2/2 UTI.    Appointment next month with Urogyn.    Using depends and pads.    Has a full time caregiver.       T2DM:    Complains of sore feet, burning    Previously taking PreGabalin, not anymore.    Metformin   Recent glucose elevated to 200s during infection, but now trending down.    Never been on insulin    Needs DM eduction and ophtho         Health Maintenance:     Health Maintenance Due        Topic  Date Due         ?  Depression Screen   Never done     ?  Shingles Vaccine (1 of 2)  Never done     ?  Lipid Screen   09/24/2019         ?  Medicare Yearly Exam   12/13/2021               Past Medical Hx   I personally reviewed.     Past Medical History:        Diagnosis  Date         ?  Arthritis       ?  CVA (cerebral infarction)       ?  Diabetes (HCC)   01/09/2015          Hemoglobin A1C 6.4 (per records, done at outside facility)         ?  GERD (gastroesophageal reflux disease)       ?  History of recurrent UTIs  on chronic antibiotic prophylaxis, followed by urology         ?  HLD (hyperlipidemia)  01/09/2015          Total Chol 141, Tri 49, HDL 59, LDL 72         ?  Hypertension       ?  Neuropathy       ?  Osteopetrosis           ?  Spinal stenosis              SocHx    I personally reviewed.     Social History          Socioeconomic History         ?  Marital status:  WIDOWED              Spouse name:  Not on file         ?  Number of children:  Not on file     ?  Years of education:  Not on file     ?  Highest education level:  Not on file       Occupational History        ?  Not on file       Tobacco Use         ?  Smoking status:  Never     ?  Smokeless tobacco:  Never       Substance and Sexual Activity         ?  Alcohol use:  No     ?  Drug use:  No     ?  Sexual activity:  Never        Other Topics  Concern        ?  Not on file       Social History Narrative        ?  Not on file          Social Determinants of Health          Financial Resource Strain: Not on file     Food Insecurity: Not on file     Transportation Needs: Not on file     Physical Activity: Not on file     Stress: Not on file     Social Connections: Not on file     Intimate Partner Violence: Not on file       Housing Stability: Not on file            Allergies   I personally reviewed.     Allergies        Allergen  Reactions         ?  Nitrofurantoin Monohyd/M-Cryst  Rash             Was placed on it 09/2019 and pt noted rash on her hand immediately after starting antibx. Will add it back to her allergy list.            Medications   I personally reviewed.     Current Outpatient Medications on File Prior to Visit          Medication  Sig  Dispense  Refill           ?  aspirin delayed-release 81 mg tablet           ?  carvediloL (COREG) 3.125 mg tablet  Take 3.125 mg by mouth  two (2) times a  day.         ?  cephALEXin (KEFLEX) 250 mg capsule  TAKE 1 CAPSULE BY MOUTH THREE TIMES DAILY FOR 7 DAYS         ?  diclofenac (VOLTAREN) 1 % gel  Apply 2 g to affected area four (4) times daily.         ?  estradioL (VAGIFEM) 10 mcg tab vaginal tablet  Insert 10 mcg into vagina.         ?  famotidine (PEPCID) 40 mg tablet           ?  fosfomycin (MONUROL) 3 gram pack oral packet  TAKE 1 PACKET MIXED IN 3-4 OUNCES OF WATER ONCE         ?  losartan (COZAAR) 100 mg tablet  TAKE 1 TABLET BY MOUTH EVERY DAY  30 Tab  0     ?  MYRBETRIQ 50 mg ER tablet  TAKE 1 TABLET BY MOUTH DAILY  90 Tab  3     ?  amLODIPine (NORVASC) 10 mg tablet  TAKE 1 TABLET BY MOUTH DAILY FOR HIGH BLOOD PRESSURE  90 Tab  3     ?  atorvastatin (LIPITOR) 10 mg tablet  TAKE 1 TABLET BY MOUTH DAILY  90 Tab  4     ?  atorvastatin (LIPITOR) 10 mg tablet  TAKE 1 TABLET BY MOUTH DAILY  90 Tab  1     ?  metFORMIN (GLUCOPHAGE) 500 mg tablet  TAKE 1 TABLET BY MOUTH DAILY WITH BREAKFAST  90 Tab  1     ?  polyethylene glycol (MIRALAX) 17 gram packet  Take 17 g by mouth daily as needed (constipation).         ?  docusate sodium (COLACE) 100 mg capsule  Take 100 mg by mouth daily. 1-3 softgels daily         ?  acetaminophen (TYLENOL) 650 mg TbER  Take 650 mg by mouth daily as needed for Pain.               ?  polyethylene glycol (MIRALAX) 17 gram/dose powder  MIX 17 GRAMS IN 8 OUNCES OF FLUID AND DRINK EVERY DAY AS DIRECTED  527 g  0           ?  ascorbic acid (VITAMIN C) 1,000 mg tablet  Take 1,000 mg by mouth daily.         ?  multivitamin with iron tablet  Take 1 tablet by mouth daily.         ?  calcium-cholecalciferol, D3, (CALCIUM 600 + D) tablet  Take 2 Tabs by mouth daily.  60 Tab  12     ?  aspirin-dipyridamole (AGGRENOX) 25-200 mg per SR capsule  TAKE ONE CAPSULE BY MOUTH TWICE DAILY (Patient not taking: Reported on 12/29/2021)  60 Cap  3           ?  nizatidine (AXID) 150 mg cap capsule  Take 1 Cap by mouth as needed (GERD). (Patient not  taking: Reported on 12/29/2021)  90 Cap  0          No current facility-administered medications on file prior to visit.            ROS:    Review of Systems    Constitutional:  Positive for malaise/fatigue. Negative for chills, fever and weight loss.    Gastrointestinal:  Negative for abdominal pain, nausea and vomiting.  Genitourinary:  Positive for dysuria and frequency . Negative for flank pain and hematuria.    Musculoskeletal:  Negative for myalgias.            Objective:     Vitals   I personally reviewed.   Visit Vitals      BP  134/65     Pulse  84     Temp  97.9 F (36.6 C) (Oral)     Ht  4\' 11"  (1.499 m)     Wt  96 lb (43.5 kg)     LMP  10/23/1978     SpO2  96%        BMI  19.39 kg/m            Physical Exam:    Physical Exam   Constitutional :        Appearance: Normal appearance.     Cardiovascular:       Rate and Rhythm: Normal rate and regular rhythm.       Pulses: Normal pulses.       Heart sounds: Normal heart sounds.    Pulmonary:       Effort: Pulmonary effort is normal. No respiratory distress.       Breath sounds: Normal breath sounds. No wheezing or rhonchi.    Neurological :       General: No focal deficit present.       Mental Status: She is alert and oriented to person, place, and time.           Notable Labs and Imaging:    NA        Assessment/Plan:     Assessment:    88yo female with history of T2DM, HTN, CAD, HLD, and recurrent UTIs presents for follow up. Has not been seen in this clinic for years due to recently living in [place] during [place].    Will refill home medication, however patient will need follow up for additional chronic conditions.       1. Recurrent UTI   History of recurrent UTI previously on abx prophylaxis. Previous cultures include E Coli (res to Ampicillin and Bactrim) and Ent. Faecalis (resistant to tetracycline).    Patient to keep scheduled follow up with UroGyn for further management.       2. Type 2 diabetes with nephropathy (HCC)   - REFERRAL TO  DIABETIC EDUCATION   - REFERRAL TO OPHTHALMOLOGY      3. Wong-Baker pain scale rating 4, hurts little more   - pregabalin (LYRICA) 100 mg capsule; TAKE 1 CAPSULE BY MOUTH DAILY AS NEEDED FOR NEUROPATHY  Dispense: 30 Capsule; Refill: 1           Follow-up and Dispositions        Return in about 2 weeks (around 01/12/2022) for Chronic Conditions (40 minutes appointment) .                    Pt was discussed with Dr 01/14/2022 (attending physician).      I have reviewed patient medical and social history and medications.  I have reviewed pertinent labs results and other data. I have discussed the diagnosis with the patient and the intended plan as  seen in the above orders. The patient has received an after-visit summary and questions were answered concerning future plans. I have discussed medication side effects and warnings with the patient as well.      Mikey Bussing, MD   Resident [facility]  [facility]

## 2021-12-29 NOTE — Progress Notes (Signed)
Progress Notes by Mellody Life, LPN at 40/98/11 1600                Author: Mellody Life, LPN  Service: --  Author Type: Licensed Nurse       Filed: 02/10/22 1143  Encounter Date: 12/29/2021  Status: Signed          Editor: Mellody Life, LPN (Licensed Nurse)               Identified pt with two pt identifiers(name and DOB).       Reviewed record in preparation for visit and have obtained necessary documentation.       All patient medications has been reviewed.     Chief Complaint       Patient presents with        ?  Establish Care             #Urinary incontinence and recurrent UTI's per dtr. Abx started Sunday keflex 500 mg - awaiting for urine culture , has UROGYN follow up next month   #Bilateral foot pain -   #Buttocks pain "soreness"   #Diabetes nutrition per dtr            Visit Vitals      BP  134/65     Pulse  84     Temp  97.9 F (36.6 C) (Oral)     Ht  4\' 11"  (1.499 m)     Wt  96 lb (43.5 kg)     SpO2  96%        BMI  19.39 kg/m        1. Have you been to the ER, urgent care clinic since your last visit?  Hospitalized since your last visit?No      2. Have you seen or consulted any other health care providers outside of the [facility] since your last visit?  Include any pap smears or colon screening. No

## 2021-12-29 NOTE — Progress Notes (Signed)
Progress Notes by Amelia Jo, MD at 12/29/21 1600                Author: Amelia Jo, MD  Service: --  Author Type: Physician       Filed: 03/02/22 1543  Encounter Date: 12/29/2021  Status: Signed          Editor: Amelia Jo, MD (Physician)               I reviewed the patient's medical history, the resident's findings on physical examination, the patient's diagnoses, and treatment plan as documented in the resident note.  I concur  with the treatment plan as documented.

## 2022-02-24 ENCOUNTER — Ambulatory Visit
Admit: 2022-02-24 | Discharge: 2022-02-24 | Payer: MEDICARE | Attending: Student in an Organized Health Care Education/Training Program | Primary: Student in an Organized Health Care Education/Training Program

## 2022-02-24 ENCOUNTER — Ambulatory Visit
Attending: Student in an Organized Health Care Education/Training Program | Primary: Student in an Organized Health Care Education/Training Program

## 2022-02-24 DIAGNOSIS — E1121 Type 2 diabetes mellitus with diabetic nephropathy: Secondary | ICD-10-CM

## 2022-02-24 DIAGNOSIS — I10 Essential (primary) hypertension: Secondary | ICD-10-CM

## 2022-02-24 MED ORDER — LOSARTAN 100 MG TAB
100 mg | ORAL_TABLET | Freq: Every day | ORAL | 2 refills | Status: AC
Start: 2022-02-24 — End: ?

## 2022-02-24 NOTE — Progress Notes (Signed)
Progress Notes by Elease Hashimoto, MD at 02/24/22 1620                Author: Elease Hashimoto, MD  Service: --  Author Type: Physician       Filed: 02/24/22 1855  Encounter Date: 02/24/2022  Status: Signed          Editor: Elease Hashimoto, MD (Physician)               I reviewed with the resident the medical history and the resident's findings on the physical examination.  I discussed with the resident the patient's diagnosis and concur with the  plan.

## 2022-02-24 NOTE — Progress Notes (Signed)
Progress Notes by Henderson [place], LPN at 54/65/68 1620                Author: Henderson [place], LPN  Service: --  Author Type: Licensed Nurse       Filed: 02/24/22 1807  Encounter Date: 02/24/2022  Status: Signed          Editor: Henderson [place], LPN (Licensed Nurse)                 Chief Complaint       Patient presents with        ?  Follow Up Chronic Condition             2 Week Follow Up. BP this morning 147/83 at 10am        Visit Vitals      BP  (!) 153/73 (BP 1 Location: Left upper arm, BP Patient Position: Sitting, BP Cuff Size: Adult)     Pulse  83     Temp  98.3 F (36.8 C) (Oral)     Resp  16     Ht  4\' 11"  (1.499 m)     Wt  107 lb 9.6 oz (48.8 kg)     SpO2  96%        BMI  21.73 kg/m           1. Have you been to the ER, urgent care clinic since your last visit?  Hospitalized since your last visit?No      2. Have you seen or consulted any other health care providers outside of the [facility] since your last visit?  Include any pap smears or colon screening. No

## 2022-02-24 NOTE — Progress Notes (Signed)
Progress  Notes by Carmina Miller, MD at 02/24/22 1620                Author: Carmina Miller, MD  Service: --  Author Type: Resident       Filed: 02/24/22 1807  Encounter Date: 02/24/2022  Status: Signed          Editor: Carmina Miller, MD (Resident)                    [address]   [place], [place] [address]    Office [phone], Fax [phone]        Subjective:          Chief Complaint       Patient presents with        ?  Follow Up Chronic Condition             2 Week Follow Up. BP this morning 147/83 at 10am           HPI:   Chelsea Schultz is a 88 y.o. female with a history of T2DM, HTN, urinary incontinence that presents for:      T2DM:    A1c of 7.3, checked in January.    Patient keeping home log, patient is keeping home log of daily blood sugars, average in the 130s to 140s.  Patient denies any neuropathy.  Patient has scheduled retinal eye exam tomorrow.   Patient currently taking metformin 500 mg daily.          HTN:    Log reviewed, average pressures 150s/80s   Currently taking Losartan 50mg  daily, Amlodipine 10mg  daily, Coreg 3mg  BID.    Patient denies any chest pain, shortness of breath, also denies headaches or lightheadedness.      Health Maintenance:     Health Maintenance Due        Topic  Date Due         ?  Depression Screen   Never done     ?  Shingles Vaccine (1 of 2)  Never done     ?  Lipid Screen   09/24/2019         ?  Medicare Yearly Exam   12/13/2021               Past Medical Hx   I personally reviewed.     Past Medical History:        Diagnosis  Date         ?  Arthritis       ?  CVA (cerebral infarction)       ?  Diabetes (HCC)  01/09/2015          Hemoglobin A1C 6.4 (per records, done at outside facility)         ?  GERD (gastroesophageal reflux disease)       ?  History of recurrent UTIs            on chronic antibiotic prophylaxis, followed by urology         ?  HLD (hyperlipidemia)  01/09/2015          Total Chol 141, Tri 49, HDL 59, LDL 72         ?  Hypertension        ?  Neuropathy       ?  Osteopetrosis           ?  Spinal stenosis  SocHx    I personally reviewed.     Social History          Socioeconomic History         ?  Marital status:  WIDOWED              Spouse name:  Not on file         ?  Number of children:  Not on file     ?  Years of education:  Not on file     ?  Highest education level:  Not on file       Occupational History        ?  Not on file       Tobacco Use         ?  Smoking status:  Never     ?  Smokeless tobacco:  Never       Substance and Sexual Activity         ?  Alcohol use:  No     ?  Drug use:  No     ?  Sexual activity:  Never        Other Topics  Concern        ?  Not on file       Social History Narrative        ?  Not on file          Social Determinants of Health          Financial Resource Strain: Not on file     Food Insecurity: Not on file     Transportation Needs: Not on file     Physical Activity: Not on file     Stress: Not on file     Social Connections: Not on file     Intimate Partner Violence: Not on file       Housing Stability: Not on file            Allergies   I personally reviewed.     Allergies        Allergen  Reactions         ?  Nitrofurantoin Monohyd/M-Cryst  Rash             Was placed on it 09/2019 and pt noted rash on her hand immediately after starting antibx. Will add it back to her allergy list.         ?  Hydrochlorothiazide  Other (comments)             Hypercalcemia and weakness            Medications   I personally reviewed.     Current Outpatient Medications on File Prior to Visit          Medication  Sig  Dispense  Refill           ?  aspirin delayed-release 81 mg tablet           ?  carvediloL (COREG) 3.125 mg tablet  Take 3.125 mg by mouth two (2) times a day.         ?  cephALEXin (KEFLEX) 250 mg capsule  TAKE 1 CAPSULE BY MOUTH THREE TIMES DAILY FOR 7 DAYS         ?  diclofenac (VOLTAREN) 1 % gel  Apply 2 g to affected area four (4) times daily.         ?  estradioL (VAGIFEM) 10  mcg tab  vaginal tablet  Insert 10 mcg into vagina.         ?  famotidine (PEPCID) 40 mg tablet           ?  fosfomycin (MONUROL) 3 gram pack oral packet  TAKE 1 PACKET MIXED IN 3-4 OUNCES OF WATER ONCE         ?  pregabalin (LYRICA) 100 mg capsule  TAKE 1 CAPSULE BY MOUTH DAILY AS NEEDED FOR NEUROPATHY  30 Capsule  1     ?  [DISCONTINUED] losartan (COZAAR) 100 mg tablet  TAKE 1 TABLET BY MOUTH EVERY DAY  30 Tab  0     ?  aspirin-dipyridamole (AGGRENOX) 25-200 mg per SR capsule  TAKE ONE CAPSULE BY MOUTH TWICE DAILY (Patient not taking: Reported on 12/29/2021)  60 Cap  3     ?  MYRBETRIQ 50 mg ER tablet  TAKE 1 TABLET BY MOUTH DAILY  90 Tab  3     ?  amLODIPine (NORVASC) 10 mg tablet  TAKE 1 TABLET BY MOUTH DAILY FOR HIGH BLOOD PRESSURE  90 Tab  3     ?  atorvastatin (LIPITOR) 10 mg tablet  TAKE 1 TABLET BY MOUTH DAILY  90 Tab  4     ?  atorvastatin (LIPITOR) 10 mg tablet  TAKE 1 TABLET BY MOUTH DAILY  90 Tab  1     ?  nizatidine (AXID) 150 mg cap capsule  Take 1 Cap by mouth as needed (GERD). (Patient not taking: Reported on 12/29/2021)  90 Cap  0     ?  metFORMIN (GLUCOPHAGE) 500 mg tablet  TAKE 1 TABLET BY MOUTH DAILY WITH BREAKFAST  90 Tab  1     ?  polyethylene glycol (MIRALAX) 17 gram packet  Take 17 g by mouth daily as needed (constipation).         ?  docusate sodium (COLACE) 100 mg capsule  Take 100 mg by mouth daily. 1-3 softgels daily         ?  acetaminophen (TYLENOL) 650 mg TbER  Take 650 mg by mouth daily as needed for Pain.               ?  polyethylene glycol (MIRALAX) 17 gram/dose powder  MIX 17 GRAMS IN 8 OUNCES OF FLUID AND DRINK EVERY DAY AS DIRECTED  527 g  0           ?  ascorbic acid (VITAMIN C) 1,000 mg tablet  Take 1,000 mg by mouth daily.         ?  multivitamin with iron tablet  Take 1 tablet by mouth daily.               ?  calcium-cholecalciferol, D3, (CALCIUM 600 + D) tablet  Take 2 Tabs by mouth daily.  60 Tab  12          No current facility-administered medications on file prior to visit.             ROS:     Review of Systems    Eyes:  Negative for blurred vision.    Respiratory:  Negative for cough and shortness of breath.     Cardiovascular:  Negative for chest pain, palpitations and leg swelling.    Gastrointestinal:  Negative for abdominal pain.    Neurological:  Negative for dizziness and headaches.            Objective:  Vitals   I personally reviewed.   Visit Vitals      BP  (!) 153/73 (BP 1 Location: Left upper arm, BP Patient Position: Sitting, BP Cuff Size: Adult)     Pulse  83     Temp  98.3 F (36.8 C) (Oral)     Resp  16     Ht  4\' 11"  (1.499 m)     Wt  107 lb 9.6 oz (48.8 kg)     LMP  10/23/1978     SpO2  96%        BMI  21.73 kg/m            Physical Exam:     Physical Exam   Constitutional :        Appearance: Normal appearance.     Cardiovascular:       Rate and Rhythm: Normal rate and regular rhythm.       Pulses: Normal pulses.       Heart sounds: Normal heart sounds.    Pulmonary:       Effort: Pulmonary effort is normal. No respiratory distress.       Breath sounds: Normal breath sounds. No wheezing or rhonchi.    Neurological :       General: No focal deficit present.       Mental Status: She is alert and oriented to person, place, and time.           Notable Labs and Imaging:    A1c of 7.4 January        Assessment/Plan:     Assessment:    88 year old female with history of hypertension, type 2 diabetes, urinary incontinence presents as follow-up for hypertension and diabetes.         1. Type 2 diabetes with nephropathy (HCC)   Last A1c of 7.4 in January.  Currently very well controlled for age, only taking metformin 500 mg daily.   Encouraged patient that daily glucose checks are not necessary.   No neuropathy, as scheduled follow-up with ophthalmology.   Patient requesting referral to to podiatry for foot exam.   - REFERRAL TO PODIATRY      2. Primary hypertension   Current medications include losartan 50 mg daily, amlodipine 10 mg daily, Coreg 3 mg twice daily.   Pressures  remain above goal, both on home log and in clinic.   Will increase losartan to 100 mg daily.   Patient to keep home log and follow-up in 2 weeks, will bring home blood pressure cuff.   Patient will need BMP checked at next visit.   - losartan (COZAAR) 100 mg tablet; Take 1 Tablet by mouth daily.  Dispense: 90 Tablet; Refill: 2      3. At risk for falling   - REFERRAL TO PHYSICAL THERAPY           Follow-up and Dispositions        Return in about 2 weeks (around 03/10/2022) for HTN, Medicare Wellness .                    Pt was discussed with Dr 03/12/2022 (attending physician).      I have reviewed patient medical and social history and medications.  I have reviewed pertinent labs results and other data. I have discussed the diagnosis with the patient and the intended plan as  seen in the above orders. The patient has received an after-visit summary and questions were answered  concerning future plans. I have discussed medication side effects and warnings with the patient as well.      Gerrit Halls, MD   Resident [place]

## 2022-03-02 NOTE — Telephone Encounter (Signed)
Pharmacy looking for refill on:    Famotidine 40 mg tabs 1 tab every day prn heatburn    [name]    Thanks!

## 2022-03-06 NOTE — Telephone Encounter (Signed)
Pharmacy is looking for a refill on:    Pregabalin 100mg  1 cap daily prn nerve pain  Famotidine 40 mg d prf heartburn.    Thanks!

## 2022-03-08 ENCOUNTER — Encounter

## 2022-03-08 MED ORDER — FAMOTIDINE 40 MG PO TABS
40 MG | ORAL_TABLET | Freq: Two times a day (BID) | ORAL | 2 refills | Status: AC
Start: 2022-03-08 — End: 2022-08-25

## 2022-03-08 MED ORDER — PREGABALIN 100 MG PO CAPS
100 MG | ORAL_CAPSULE | ORAL | 2 refills | Status: AC
Start: 2022-03-08 — End: 2023-03-07

## 2022-03-08 NOTE — Progress Notes (Signed)
Refill requested by pharmacy

## 2022-03-31 ENCOUNTER — Ambulatory Visit
Attending: Student in an Organized Health Care Education/Training Program | Primary: Student in an Organized Health Care Education/Training Program

## 2022-04-03 NOTE — Telephone Encounter (Signed)
-----   Message from Boyd Kerbs Oehmichen sent at 04/03/2022  3:57 PM EDT -----  Subject: Refill Request    QUESTIONS  Name of Medication? carvedilol (COREG) 3.125 MG tablet  Patient-reported dosage and instructions? 3.125mg , 1 po BID  How many days do you have left? 2  Preferred Pharmacy? [facility] [id number]  Pharmacy phone number (if available)? [phone]  Additional Information for Provider? wants 90 day supply  ---------------------------------------------------------------------------  --------------  CALL BACK INFO  What is the best way for the office to contact you? OK to leave message on   voicemail  Preferred Call Back Phone Number? [phone]  ---------------------------------------------------------------------------  --------------  SCRIPT ANSWERS  Relationship to Patient? Other/Third Party  Representative Name? cathy  Is the representative on the Communication Release of Information (ROI)   form in Epic? Yes

## 2022-04-04 MED ORDER — CARVEDILOL 3.125 MG PO TABS
3.125 MG | ORAL_TABLET | Freq: Two times a day (BID) | ORAL | 1 refills | Status: AC
Start: 2022-04-04 — End: ?

## 2022-04-05 ENCOUNTER — Ambulatory Visit
Attending: Student in an Organized Health Care Education/Training Program | Primary: Student in an Organized Health Care Education/Training Program

## 2022-04-15 ENCOUNTER — Encounter

## 2022-04-18 MED ORDER — LOSARTAN POTASSIUM 100 MG PO TABS
100 MG | ORAL_TABLET | Freq: Every day | ORAL | 0 refills | Status: AC
Start: 2022-04-18 — End: 2022-06-02

## 2022-04-21 NOTE — Telephone Encounter (Signed)
-----   Message from Boyd Kerbs Oehmichen sent at 04/03/2022  3:55 PM EDT -----  Subject: Appointment Request    Reason for Call: Established Patient Appointment needed: Routine Existing   Condition Follow Up (Diabetes)    QUESTIONS    Reason for appointment request? No appointments available during search     Additional Information for Provider? Pt would like to reschedule her appt   to this Friday or next Monday for Dm f/u.  ---------------------------------------------------------------------------  --------------  Cleotis Lema INFO  [phone]; OK to leave message on voicemail  ---------------------------------------------------------------------------  --------------  SCRIPT ANSWERS

## 2022-05-04 MED ORDER — AMLODIPINE BESYLATE 10 MG PO TABS
10 MG | ORAL_TABLET | ORAL | 1 refills | Status: AC
Start: 2022-05-04 — End: ?

## 2022-05-10 NOTE — Telephone Encounter (Signed)
Location of patient: [place]    Received call from [place] at [employer] with [employer].    Subjective: Caller states "hit head against bed 2 nights ago. Bleeding to back of head, still an open cut."     Current Symptoms: Hit back of head on bed 2 nights ago. She was getting in bed and lost balance and hit head on back of bed (is a hospital bed). She slipped and went under the bed.  Has a cut on back of head.  No changes with vision.  No bump or bruising  No headache.  Still complaining of soreness and hard to sleep due to the cut.  Cut is not gaping.  Caller wants patient seen due to soreness.  No lightheaded.    Onset: 2 days ago; sudden    Associated Symptoms: NA    Pain Severity:     Temperature:      What has been tried: topical ointment.    LMP: NA Pregnant: NA    Recommended disposition: See in Office Today    Care advice provided, patient verbalizes understanding; denies any other questions or concerns; instructed to call back for any new or worsening symptoms.    Patient/Caller agrees with recommended disposition; Clinical research associate provided warm transfer to [employer] at [employer] for appointment scheduling    Attention Provider:  Thank you for allowing me to participate in the care of your patient.  The patient was connected to triage in response to information provided to the ECC/PSC.  Please do not respond through this encounter as the response is not directed to a shared pool.      Reason for Disposition   Patient wants to be seen    Protocols used: Head Injury-ADULT-[place]

## 2022-05-11 ENCOUNTER — Ambulatory Visit
Admit: 2022-05-11 | Discharge: 2022-05-11 | Payer: MEDICARE | Attending: Family Medicine | Primary: Student in an Organized Health Care Education/Training Program

## 2022-05-11 DIAGNOSIS — W1830XA Fall on same level, unspecified, initial encounter: Secondary | ICD-10-CM

## 2022-05-11 DIAGNOSIS — I10 Essential (primary) hypertension: Secondary | ICD-10-CM

## 2022-05-11 NOTE — Telephone Encounter (Signed)
Called and spoke with Chelsea Schultz, Chelsea Schultz daughter, to obtain a status update on Chelsea Schultz, and any changes to her symptoms since the fall. Explained that from reading the notes ahead of tonight's evening visit, we wanted to ensure that a head injury in need of more urgent care had not occurred.    Per Chelsea Schultz, there were no initial symptoms. The open area is small and in the "middle of her hair." No bandaid used, only a ointment. Chelsea Schultz expresses that they also called because Chelsea Schultz has been experiencing discomfort primarily to the right arm. The caregiver has not noted any significant changes. Chelsea Schultz expresses that she would have taken Chelsea Schultz to an urgent care if her symptoms were concerning to them.     Chelsea Schultz also states that Chelsea Schultz was due for a BP follow up, and they are glad she is able to be seen. They plan to bring her BP log, in  case this information might be helpful.    No other concerns at this time.

## 2022-05-11 NOTE — Progress Notes (Signed)
Patient has been identified by name and DOB.    Chief Complaint   Patient presents with    Head Injury     Patient had a fall 3 days ago and hit her head. No LOC, dizziness, cognitive changes, or severe pain. Daughter noticed that she did have abrasions on her head. Patient's daughter states she was to have medication dose increased from last visit with Dr. Cristela Felt but it still taking the same old dose as new RX for Losartan was never sent in. They are concerned about her BP.       Vitals:    05/11/22 1833 05/11/22 1840   BP: (!) 169/76 (!) 169/75   Site: Right Upper Arm Right Upper Arm   Position: Sitting Sitting   Cuff Size: Medium Adult Medium Adult   Pulse: 86    Resp: 18    Temp: 98.8 F (37.1 C)    TempSrc: Oral    SpO2: 94%         1. Have you been to the ER, urgent care clinic since your last visit?  Hospitalized since your last visit?No    2. Have you seen or consulted any other health care providers outside of the [facility] since your last visit?  Include any pap smears or colon screening. No

## 2022-05-11 NOTE — Progress Notes (Signed)
Subjective:   Chelsea Schultz is an 88 y.o. female who presents for  evaluation after the head injury.    The patient is accompanied by her daughter Chelsea Schultz).     HPI  Chief Complaint   Patient presents with    Head Injury     Patient had a fall 3 days ago and hit her head. No LOC, dizziness, cognitive changes, or severe pain. Daughter noticed that she did have abrasions on her head. Patient's daughter states she was to have medication dose increased from last visit with Dr. Cristela Felt but it still taking the same old dose as new RX for Losartan was never sent in. They are concerned about her BP.     Fall  Per daughter 3 days ago the patient had a fall when she was trying to sit on the bed but slipped over the bed and he did the back of her head.  No associated loss of consciousness, no dizziness, no blurry vision.  Right after the fall the daughter noticed mild bleeding from the back of the head.  Today the patient reported just like mild soreness of the on the back of the head.  Hypertension  She has been checking her blood pressure at home and most of the readings in the 120s 130s over 80s, there is only 1 reading with a blood pressure above 140/90.  Recent UTI   Was seen by UROGYN recently and was diagnosed on UTI.  She is currently on "broad-spectrum antibiotics" but the daughter does not know specific name. Denies dysuria today.   Currently on       Allergies - reviewed:   Allergies   Allergen Reactions    Nitrofurantoin Rash     Was placed on it 09/2019 and pt noted rash on her hand immediately after starting antibx. Will add it back to her allergy list.         Medications - reviewed:  Current Outpatient Medications   Medication Sig    amLODIPine (NORVASC) 10 MG tablet TAKE 1 TABLET(10 MG) BY MOUTH EVERY NIGHT    losartan (COZAAR) 100 MG tablet Take 1 tablet by mouth daily    carvedilol (COREG) 3.125 MG tablet Take 1 tablet by mouth 2 times daily    famotidine (PEPCID) 40 MG tablet Take 0.5 tablets by mouth in  the morning and at bedtime ceived the following from [facility]: Outside name: famotidine (PEPCID) 40 mg tablet    pregabalin (LYRICA) 100 MG capsule TAKE 1 CAPSULE BY MOUTH DAILY AS NEEDED FOR NEUROPATHY    acetaminophen (TYLENOL) 650 MG extended release tablet Take 1 tablet by mouth daily as needed    ascorbic acid (VITAMIN C) 1000 MG tablet Take 1 tablet by mouth daily    aspirin 81 MG EC tablet ceived the following from [facility]: Outside name: aspirin delayed-release 81 mg tablet    aspirin-dipyridamole (AGGRENOX) 25-200 MG per extended release capsule Take 1 capsule by mouth 2 times daily    atorvastatin (LIPITOR) 10 MG tablet Take 1 tablet by mouth daily    calcium carb-cholecalciferol 600-10 MG-MCG TABS per tab Take 2 tablets by mouth daily    cephALEXin (KEFLEX) 250 MG capsule TAKE 1 CAPSULE BY MOUTH THREE TIMES DAILY FOR 7 DAYS    diclofenac sodium (VOLTAREN) 1 % GEL Apply 2 g topically 4 times daily    docusate (COLACE, DULCOLAX) 100 MG CAPS Take 100 mg by mouth daily  Estradiol (VAGIFEM) 10 MCG TABS vaginal tablet Place 1 tablet vaginally    fosfomycin tromethamine (MONUROL) 3 g PACK TAKE 1 PACKET MIXED IN 3-4 OUNCES OF WATER ONCE    losartan (COZAAR) 100 MG tablet Take 1 tablet by mouth daily    metFORMIN (GLUCOPHAGE) 500 MG tablet Take 1 tablet by mouth daily    mirabegron (MYRBETRIQ) 50 MG TB24 Take 50 mg by mouth daily    nizatidine (AXID) 150 MG capsule Take 1 capsule by mouth as needed    polyethylene glycol (GLYCOLAX) 17 GM/SCOOP powder Take 17 g by mouth daily as needed     No current facility-administered medications for this visit.         Past Medical History - reviewed:  Past Medical History:   Diagnosis Date    Arthritis     CVA (cerebral infarction)     Diabetes (HCC) 01/09/2015    Hemoglobin A1C 6.4 (per records, done at outside facility)    GERD (gastroesophageal reflux disease)     History of recurrent UTIs     on chronic antibiotic prophylaxis,  followed by urology    HLD (hyperlipidemia) 01/09/2015    Total Chol 141, Tri 49, HDL 59, LDL 72    Hypertension     Neuropathy     Osteopetrosis     Spinal stenosis            Review of Systems   Per HPI        Objective:   BP (!) 169/75 (Site: Right Upper Arm, Position: Sitting, Cuff Size: Medium Adult)   Pulse 86   Temp 98.8 F (37.1 C) (Oral)   Resp 18   SpO2 94%     General appearance - alert, well appearing, and in no distress  Eyes - pupils equal and reactive, extraocular eye movements intact  Chest - clear to auscultation, no wheezes, rales or rhonchi, symmetric air entry  Heart - normal rate, regular rhythm, normal S1, S2, no murmurs, rubs, clicks or gallops  Abdomen - soft, nontender, nondistended, no masses or organomegaly  Neurological - alert, oriented, normal speech, no focal findings or movement disorder noted  Extremities - peripheral pulses normal, no pedal edema, no clubbing or cyanosis  Skin - +There is small linear (approx 1 cm in length ) superficial laceration on the right side of the back of the head with minimal       Assessment:   Chelsea Schultz is a 88 y.o. female who was seen today for:   Diagnosis Orders   1. Fall from ground level        2. Frequent falls  [facility], [place]      3. Debility  [facility] - [facility], [place]      4. Primary hypertension  Basic Metabolic Panel      5. History of falling  [facility] - [facility], [place]      6. Laceration of scalp without foreign body, initial encounter        7. Type 2 diabetes mellitus with diabetic nephropathy, without long-term current use of insulin (HCC)  Hemoglobin A1C              Plan:   Head laceration after GFL.  The physical exam is consistent with superficial laceration of the head without active bleeding.  The patient without loss of consciousness following the ground-level fall, no focal neurological signs, no findings on the neuro  exam.  Discussed with the patient and the  daughter that no imaging of the head is indicated.  Will allow laceration  like heal by itself.  Hypertension.  The patient with elevated blood pressure today at the clinic but well controlled at home.  The home blood pressure machine was calibrated with the clinic machine and the readings were accurate.  The patient has been compliant with your blood pressure medications so advised to continue the same medications including losartan 100 mg, Coreg 3.125 mg and amlodipine 10 mg.  Discussed with the patient and the daughter that there is a potential component of whitecoat hypertension.  Will keep their medications and continue monitor the blood pressure.  Will check BMP today since losartan dose was at current increased at the last visit.  The daughter requested the referral to the home health for the physical and Occupational Therapy due to history of falls.  Order was placed in the chart.    -Follow-up in 1 months for Medicare wellness and hypertension follow-up      I have discussed the diagnosis with the patient and the intended plan as seen in the above orders.  The patient has received an after-visit summary and questions were answered concerning future plans.  I have discussed medication side effects and warnings with the patient as well. Informed pt to return to the office if new symptoms arise.      Mathis Bud, MD  Family Medicine Attending Physician

## 2022-05-15 ENCOUNTER — Telehealth

## 2022-05-15 NOTE — Telephone Encounter (Signed)
Chelsea Schultz stated that they have received your request for pt to have home health care PT; however, falling is not an acceptable diagnosis for the care requested. She is canceling the request.    Thank you

## 2022-05-17 NOTE — Addendum Note (Signed)
Addended by: Basilia Jumbo A on: 05/17/2022 03:16 PM     Modules accepted: Orders

## 2022-05-17 NOTE — Telephone Encounter (Signed)
Updated order to reflect appropriate dx for [facility].

## 2022-05-22 NOTE — Telephone Encounter (Signed)
This Clinical research associate called Cathy (ROI). This Clinical research associate identified patient by 2 identifiers with [employer]. Informed Lynden Ang we have attempted to contact her multiple times today with [facility]. Informed Lynden Ang that we sent a mychart message as well with the telephone number to contact [facility] as well. All questions answered, no further questions or concerns at this time.

## 2022-05-22 NOTE — Telephone Encounter (Signed)
Regarding: Home Health PT  Contact: [phone]  ----- Message from Addison Lank sent at 05/22/2022 11:58 AM EDT -----       ----- Message sent from Lunette Stands, LPN to Barnie Alderman H at 05/22/2022 11:15 AM -----   Good morning,     I had noted that my last message was not read yet. As [facility] was awaiting your call. I attempted to contact you as well, no answer and I was unable to leave a voicemail as your box is full. Please try to reach out to them to get [facility] set up for Home Health PT and OT. [phone] press option 2. Please let me know if you have issues with getting in contact with them.    Thanks    ME, LPN      ----- Message -----       From:LPN Marcelino Duster E       Sent:05/22/2022 10:35 AM EDT         To:Dellis Filbert    Subject:Home Health PT    Good morning    Can you please reach out to this number [phone] press Option #2, they are awaiting a call now.'    Thanks    ME, LPN      ----- Message -----       From:Zacaria Denna Haggard       Sent:05/22/2022  9:56 AM EDT         HC:[id number] Medical Advice Request Message List    Subject:Home Health PT    GM,  I have listed the phone number in both messages sent to follow-up on this, and it is also the same number listed in Bonnita's record.  No one has contacted me or attempted to do so on this number, nor have they left a message, so very unclear as to why the consult would have been canceled.  Please have someone contact me today, so that we can get this service that should have been initiated as early as 7/21, done so asap.      Thank you for a timely follow-up,  Azeneth Carbonell  [phone]         ----- Message -----       From:LPN Terrance Mass       Sent:05/22/2022  9:25 AM EDT         To:Dellis Filbert    Subject:Home Health PT    Good morning    After reviewing the chart, noted a message saying they have been unable to reach you or patient. Please verify the correct telephone number we have on file in case the  telephone numbers are incorrect. It looks like another referral will have to be placed as they canceled the referral due to not being able to reach you or patient. Please verify the telephone numbers with me so we can update them if there have been changes. Please also let us know if there is a specific time or day you would like to be contacted.    Thanks    ME, LPN      ----- Message -----       From:Nautia Denna Haggard       Sent:05/22/2022  8:56 AM EDT         To:Dr. Mathis Bud    Subject:Home Health PT    GM,  We are still awaiting contact for [facility] PT/OT for my mother, Graylyn Bunney, and have heard nothing  since her 7/20 visit with you.  Please have someone contact us today at [phone] to get this service begun.      Thank you,    Lucius Conn

## 2022-05-22 NOTE — Telephone Encounter (Signed)
Patient's daughter called stating that she received a call from the office. She stated that there has been a bit of phone tag going on, so she would like to be contacted at your earliest convenience to discuss home health at [phone]. Would like to be contacted today preferably.     Thanks!

## 2022-05-24 ENCOUNTER — Encounter

## 2022-05-24 NOTE — Progress Notes (Signed)
Home Health referral sent again, with specification for [facility], along with PT/OT and Home Health Services.

## 2022-05-24 NOTE — Telephone Encounter (Signed)
This Clinical research associate verified patient by 2 identifiers with Daughter Lynden Ang. Informed Lynden Ang that a new referral was sent over to [facility] with Attn: Trinna Post. This Clinical research associate received confirmation that fax was received. Lynden Ang had no further questions or concerns.

## 2022-05-24 NOTE — Telephone Encounter (Signed)
Pt's daughter requested that the order for Home Health Care need to be sent to [facility] today. Please fax to Alex at [phone].    Thank you

## 2022-05-26 ENCOUNTER — Ambulatory Visit
Payer: MEDICARE | Attending: Student in an Organized Health Care Education/Training Program | Primary: Student in an Organized Health Care Education/Training Program

## 2022-05-26 ENCOUNTER — Ambulatory Visit: Payer: MEDICARE | Primary: Student in an Organized Health Care Education/Training Program

## 2022-05-28 ENCOUNTER — Encounter

## 2022-05-29 MED ORDER — LOSARTAN POTASSIUM 100 MG PO TABS
100 MG | ORAL_TABLET | Freq: Every day | ORAL | 0 refills | Status: AC
Start: 2022-05-29 — End: 2022-07-13

## 2022-05-29 NOTE — Telephone Encounter (Signed)
Single refill sent, patient scheduled for follow up BMP in 2 weeks. Will send further refills after pending appropriate electrolytes on check.

## 2022-06-01 ENCOUNTER — Encounter

## 2022-06-12 ENCOUNTER — Ambulatory Visit: Payer: MEDICARE | Primary: Student in an Organized Health Care Education/Training Program

## 2022-06-12 ENCOUNTER — Encounter
Payer: MEDICARE | Attending: Student in an Organized Health Care Education/Training Program | Primary: Student in an Organized Health Care Education/Training Program

## 2022-06-19 ENCOUNTER — Ambulatory Visit
Admit: 2022-06-19 | Discharge: 2022-06-19 | Payer: MEDICARE | Attending: Student in an Organized Health Care Education/Training Program | Primary: Student in an Organized Health Care Education/Training Program

## 2022-06-19 ENCOUNTER — Ambulatory Visit
Admit: 2022-06-19 | Discharge: 2022-06-19 | Payer: MEDICARE | Primary: Student in an Organized Health Care Education/Training Program

## 2022-06-19 DIAGNOSIS — H9193 Unspecified hearing loss, bilateral: Secondary | ICD-10-CM

## 2022-06-19 DIAGNOSIS — E1121 Type 2 diabetes mellitus with diabetic nephropathy: Secondary | ICD-10-CM

## 2022-06-19 DIAGNOSIS — I10 Essential (primary) hypertension: Secondary | ICD-10-CM

## 2022-06-19 NOTE — Progress Notes (Addendum)
[address]  [place], [place] [address]   Office [phone], Fax [phone]    Subjective:     Chief Complaint   Patient presents with    Follow-up Chronic Condition       HPI:  Chelsea Schultz is a 88 y.o. female with a history of hypertension, type 2 diabetes, history of stroke that presents for:      Hypertension: Current Blood Pressure 162/78. Home pressures ranging from 150s/70 to 130s/60s.  Home medications include: Losartan 100, Coreg 3 BID, Amlodipine 10  History of stroke or MI: Yes, stroke hx   Current symptoms include: None      Recent GLF with Residual Shoulder Pain:  Has had several falls over the last year  Soreness present over right arm after fall a few months ago.  Has also had a fall at night, and she was on the floor for a couple hours. Now has a bed monitor.   Has already tried topical Diclofenac   Patient does have 24/7 care at home.   Has a bedside commode.   Right shoulder pain when walking with walker. Is already working with physical therapy.     Fecal Incontinence:   Alternating with constipation and loose stool, has been managing with Miralax.   Recently has become unaware of stool.   Has had lumbar stenosis s/p laminectomy in 2014  Denies any tingling and numbness shooting down her legs.   Has DM and chronic foot neuropathy, but no sciatica  Has had multiple strokes in the past, mild residual LL extremity weakness.     Hearing Difficulties:   Slowly worsening trouble hearing.   Requesting etiology evaluate          Health Maintenance:  Health Maintenance Due   Topic Date Due    DTaP/Tdap/Td vaccine (1 - Tdap) Never done    Shingles vaccine (1 of 2) Never done    Hepatitis B vaccine (1 of 3 - Risk 3-dose series) Never done    Respiratory Syncytial Virus (RSV) age 12 yrs+ (1 - 1-dose 60+ series) Never done    Lipids  09/24/2019    Annual Wellness Visit (AWV)  Never done    Pneumococcal 65+ years Vaccine (2 - PCV) 11/16/2021    Flu vaccine (1) 05/23/2022    COVID-19 Vaccine  (5 - 2023-24 season) 06/23/2022          Past Medical Hx  I personally reviewed.  Past Medical History:   Diagnosis Date    Arthritis     CVA (cerebral infarction)     Diabetes (HCC) 01/09/2015    Hemoglobin A1C 6.4 (per records, done at outside facility)    GERD (gastroesophageal reflux disease)     History of recurrent UTIs     on chronic antibiotic prophylaxis, followed by urology    HLD (hyperlipidemia) 01/09/2015    Total Chol 141, Tri 49, HDL 59, LDL 72    Hypertension     Neuropathy     Osteopetrosis     Spinal stenosis         SocHx   I personally reviewed.  Social History     Socioeconomic History    Marital status: Widowed     Spouse name: Not on file    Number of children: Not on file    Years of education: Not on file    Highest education level: Not on file   Occupational History    Not on file  Tobacco Use    Smoking status: Never    Smokeless tobacco: Never   Substance and Sexual Activity    Alcohol use: No    Drug use: No    Sexual activity: Not on file   Other Topics Concern    Not on file   Social History Narrative    Not on file     Social Determinants of Health     Financial Resource Strain: Low Risk  (06/19/2022)    Overall Financial Resource Strain (CARDIA)     Difficulty of Paying Living Expenses: Not hard at all   Food Insecurity: Not on file (06/19/2022)   Transportation Needs: Unknown (06/19/2022)    PRAPARE - Therapist, art (Medical): Not on file     Lack of Transportation (Non-Medical): No   Physical Activity: Insufficiently Active (09/05/2022)    Exercise Vital Sign     Days of Exercise per Week: 7 days     Minutes of Exercise per Session: 10 min   Stress: Not on file   Social Connections: Not on file   Intimate Partner Violence: Not on file   Housing Stability: Unknown (06/19/2022)    Housing Stability Vital Sign     Unable to Pay for Housing in the Last Year: Not on file     Number of Places Lived in the Last Year: Not on file     Unstable Housing in the Last Year:  No        Allergies  I personally reviewed.  Allergies   Allergen Reactions    Nitrofurantoin Rash     Was placed on it 09/2019 and pt noted rash on her hand immediately after starting antibx. Will add it back to her allergy list.        Medications  I personally reviewed.  Current Outpatient Medications on File Prior to Visit   Medication Sig Dispense Refill    trospium (SANCTURA) 20 MG tablet Take 1 tablet by mouth daily (Patient not taking: Reported on 09/05/2022)      acetaminophen (TYLENOL) 650 MG extended release tablet Take 1 tablet by mouth daily as needed      ascorbic acid (VITAMIN C) 1000 MG tablet Take 1 tablet by mouth daily      aspirin 81 MG EC tablet ceived the following from [facility]: Outside name: aspirin delayed-release 81 mg tablet      atorvastatin (LIPITOR) 10 MG tablet Take 1 tablet by mouth daily      diclofenac sodium (VOLTAREN) 1 % GEL Apply 2 g topically 4 times daily      docusate (COLACE, DULCOLAX) 100 MG CAPS Take 100 mg by mouth daily      Estradiol (VAGIFEM) 10 MCG TABS vaginal tablet Place 1 tablet vaginally      polyethylene glycol (GLYCOLAX) 17 GM/SCOOP powder Take 17 g by mouth daily as needed (Patient not taking: Reported on 09/05/2022)       No current facility-administered medications on file prior to visit.        ROS:    As listed in HPI        Objective:   Vitals  I personally reviewed.  BP (!) 162/78 (Site: Right Upper Arm, Position: Sitting, Cuff Size: Medium Adult)   Pulse 96   Temp 98.2 F (36.8 C)   Resp 15   Ht 1.499 m (4\' 11" )   Wt 49.9 kg (110 lb)   SpO2 96%  BMI 22.22 kg/m      Physical Exam:    Physical Exam  Constitutional:       Comments: Presents in wheelchair   HENT:      Right Ear: External ear normal.      Left Ear: External ear normal.   Cardiovascular:      Rate and Rhythm: Normal rate and regular rhythm.      Pulses: Normal pulses.      Heart sounds: Normal heart sounds.   Pulmonary:      Effort: Pulmonary effort is normal. No  respiratory distress.      Breath sounds: Normal breath sounds. No wheezing.   Neurological:      General: No focal deficit present.      Mental Status: She is alert and oriented to person, place, and time.          Notable Labs and Imaging:   NA     Assessment/Plan:   Assessment:   88 year old female with history of hypertension, type 2 diabetes, history of stroke presents discuss multiple chronic medical conditions today.  Follow-up for annual Medicare wellness visit      1. Primary hypertension  Controlled.  Current medications include losartan 100 mg, Coreg 3.125 mg twice daily, Norvasc 10 mg daily.  Pressures in clinic 160 over 70s, confirmed on home pressure log 150s over 70s.  Pressure technically above goal, however given history of recurrent falls would not recommend advancing antihypertensive regimen at this time.  Continue to monitor and address at next visit.    2. Hearing difficulty of both ears  Slow gradual hearing loss in both ears, requesting referral to audiology.  - External Referral To Audiology    3. Ground-level fall  Patient with recurrent ground-level falls, and her head.  Continues to use a wheelchair, patient now has a bed alarm.  Patient has around-the-clock care at home between family and nursing.    4. Incontinence of feces, unspecified fecal incontinence type  Patient has been previously taking MiraLAX regularly for constipation, and is now noting increased loose stool which he sometimes cannot control.  Recommended trial of Imodium as needed.  Patient has follow-up already scheduled with your gynecologist given urinary incontinence currently on Myrbetriq, will discuss further with specialist at that time.      Return in about 1 month (around 07/20/2022) for HTN, AMW Visit .         Pt was discussed with Dr Su Hilt (attending physician).    I have reviewed patient medical and social history and medications.  I have reviewed pertinent labs results and other data. I have discussed the  diagnosis with the patient and the intended plan as seen in the above orders. The patient has received an after-visit summary and questions were answered concerning future plans. I have discussed medication side effects and warnings with the patient as well.    Carmina [place], MD  Resident [facility]

## 2022-06-19 NOTE — Progress Notes (Addendum)
Chelsea Schultz is a 88 y.o. female    Chief Complaint   Patient presents with    Follow-up Chronic Condition       1. Have you been to the ER, urgent care clinic since your last visit?  Hospitalized since your last visit?no    2. Have you seen or consulted any other health care providers outside of the [facility] since your last visit?  Include any pap smears or colon screening. no    Vitals:    06/19/22 1729   BP: (!) 162/78   Pulse: 96   Resp: 15   Temp: 98.2 F (36.8 C)   SpO2: 96%          No data to display              Health Maintenance Due   Topic Date Due    DTaP/Tdap/Td vaccine (1 - Tdap) Never done    Shingles vaccine (1 of 2) Never done    Hepatitis B vaccine (1 of 3 - Risk 3-dose series) Never done    Respiratory Syncytial Virus (RSV) age 21 yrs+ (1 - 1-dose 60+ series) Never done    Lipids  09/24/2019    Annual Wellness Visit (AWV)  Never done    Pneumococcal 65+ years Vaccine (2 - PCV) 11/16/2021    Flu vaccine (1) 05/23/2022    COVID-19 Vaccine (5 - 2023-24 season) 06/23/2022

## 2022-06-20 LAB — BASIC METABOLIC PANEL
Anion Gap: 6 mmol/L (ref 5–15)
BUN: 14 MG/DL (ref 6–20)
Bun/Cre Ratio: 19 (ref 12–20)
CO2: 25 mmol/L (ref 21–32)
Calcium: 10.4 MG/DL — ABNORMAL HIGH (ref 8.5–10.1)
Chloride: 108 mmol/L (ref 97–108)
Creatinine: 0.73 MG/DL (ref 0.55–1.02)
Est, Glom Filt Rate: >60 mL/min/{1.73_m2} (ref >60)
Glucose: 98 mg/dL (ref 65–100)
Potassium: 4.4 mmol/L (ref 3.5–5.1)
Sodium: 139 mmol/L (ref 136–145)

## 2022-06-20 LAB — HEMOGLOBIN A1C
Hemoglobin A1C: 6.8 % — ABNORMAL HIGH (ref 4.0–5.6)
eAG: 148 mg/dL

## 2022-06-29 NOTE — Progress Notes (Signed)
I discussed the findings, assessment and plan with the resident and agree with the resident's findings and plan as documented in the resident's note.

## 2022-07-25 ENCOUNTER — Encounter

## 2022-07-25 NOTE — Telephone Encounter (Signed)
Requested Prescriptions     Pending Prescriptions Disp Refills    losartan (COZAAR) 100 MG tablet 45 tablet 0     Sig: Take 1 tablet by mouth daily        Allergies   Allergen Reactions    Nitrofurantoin Rash     Was placed on it 09/2019 and pt noted rash on her hand immediately after starting antibx. Will add it back to her allergy list.       Last visit with ordering provider: 06/19/22  Next visit with ordering provider: 08/03/22 (pending reschedule with other provider)      Signed by Annamaria Helling, RN  07/25/22  3:07 PM

## 2022-07-26 MED ORDER — LOSARTAN POTASSIUM 100 MG PO TABS
100 MG | ORAL_TABLET | Freq: Every day | ORAL | 1 refills | Status: AC
Start: 2022-07-26 — End: 2023-01-22

## 2022-07-26 NOTE — Telephone Encounter (Signed)
Losartan refilled  Lorenda Ishihara, MD

## 2022-07-31 ENCOUNTER — Encounter

## 2022-07-31 MED ORDER — CARVEDILOL 3.125 MG PO TABS
3.125 MG | ORAL_TABLET | Freq: Two times a day (BID) | ORAL | 1 refills | Status: AC
Start: 2022-07-31 — End: 2023-02-05

## 2022-07-31 MED ORDER — AMLODIPINE BESYLATE 10 MG PO TABS
10 MG | ORAL_TABLET | ORAL | 1 refills | Status: DC
Start: 2022-07-31 — End: 2022-11-13

## 2022-07-31 MED ORDER — METFORMIN HCL 500 MG PO TABS
500 MG | ORAL_TABLET | Freq: Every day | ORAL | 1 refills | Status: AC
Start: 2022-07-31 — End: ?

## 2022-07-31 NOTE — Telephone Encounter (Signed)
Meds pended routed to Provider

## 2022-07-31 NOTE — Telephone Encounter (Signed)
Refilled medications on behalf of Dr. Lovette Cliche

## 2022-07-31 NOTE — Telephone Encounter (Signed)
Medication Refill Request    ALEIRA DEITER is requesting a refill of the following medication(s):   Requested Prescriptions     Pending Prescriptions Disp Refills    amLODIPine (NORVASC) 10 MG tablet 90 tablet 1    carvedilol (COREG) 3.125 MG tablet 120 tablet 1     Sig: Take 1 tablet by mouth 2 times daily    metFORMIN (GLUCOPHAGE) 500 MG tablet 60 tablet      Sig: Take 1 tablet by mouth daily        Listed PCP is Gerrit Halls, MD   Date of Last Office Visit at [facility]: 06/19/2022 Dr. Lovette Cliche    FUTURE APPOINTMENT:   Future Appointments   Date Time Provider [place]   08/03/2022  4:00 PM Gay Filler, MD [facility] AMB       Please send refill to:    [id number] - [place], [place] - F [phone]  [place] [address]  Phone: [phone] Fax: [phone]    [facility] [place], [place] [phone] - F [phone]  [address]  [place] [address]  Phone: [phone] Fax: [facility] - [place], [place] [phone] - F [phone]  [address] [place] [address]  Phone: [phone] Fax: [phone]      Please review request and approve or deny with recommendations.

## 2022-07-31 NOTE — Telephone Encounter (Signed)
From: Orvis Brill  To: Dr. Geradine Girt  Sent: 07/31/2022 9:22 AM EDT  Subject: Need Medications    Chelsea Schultz needs medication refills on the following medications that are out of renewals and need a new script for the pharmacy. The pharmacy has mad several calls for the new scripts, but has not been done.    These are:  Carvedilol  Metformin  Amlodipine

## 2022-07-31 NOTE — Telephone Encounter (Signed)
Daughter of the patient called to request a refill for Metformin, Carvedilol, and Amlodipine.  She would like them sent to [facility].    She stated that the patient is completely out of the medication, and the pharmacy has issued her a 5 day supply.

## 2022-08-03 ENCOUNTER — Ambulatory Visit
Payer: MEDICARE | Attending: Student in an Organized Health Care Education/Training Program | Primary: Student in an Organized Health Care Education/Training Program

## 2022-08-06 ENCOUNTER — Encounter

## 2022-08-07 MED ORDER — METFORMIN HCL 500 MG PO TABS
500 MG | ORAL_TABLET | Freq: Two times a day (BID) | ORAL | 1 refills | Status: DC
Start: 2022-08-07 — End: 2022-09-05

## 2022-08-07 NOTE — Telephone Encounter (Signed)
From: Orvis Brill  To: Dr. Geradine Girt  Sent: 08/06/2022 3:08 PM EDT  Subject: Medication error    We just received a new order of Rilda's prescription Metformin. She has taken 500mg  BID for a long time, and her 180-ct prescription (90-day supply) prior to the current one was for the same. The current script was filled for 500mg  daily. Her medication was not changed in her last visit w/Dr. Lovette Cliche, thus this script would be in error. We are continuing to administer it at 500mg  BID, but this needs to be clarified.     Thanks,  Arrie Aran  Ruthel's daughter and authorized medical contact

## 2022-08-23 ENCOUNTER — Encounter

## 2022-08-23 NOTE — Telephone Encounter (Signed)
Medication Refill Request    Chelsea Schultz is requesting a refill of the following medication(s):   Requested Prescriptions     Pending Prescriptions Disp Refills    mirabegron (MYRBETRIQ) 50 MG TB24 90 tablet 1     Sig: Take 50 mg by mouth daily    famotidine (PEPCID) 20 MG tablet 180 tablet 1     Sig: Take 1 tablet by mouth 2 times daily ceived the following from [facility]: Outside name: famotidine (PEPCID) 40 mg tablet        Last Date of Medication Prescribed:   - Famotidine 20mg  BID - 03/08/2022  - Myrbetriq 09/23/2018  Last Office Visit Date: 06/19/2022-Dr. Lovette Cliche AMW    Please send refill to:    [id number] - [place], [place] - F [phone]  [place] [address]  Phone: [phone] Fax: [phone]    [facility] DR[facility] STORE [place], [place] - [place] [phone] - F [phone]  [place] [address]  Phone: [phone] Fax: [place] - [place], [place] - [place] [phone] - F [phone]  [place] [address]  Phone: [phone] Fax: [phone]

## 2022-08-23 NOTE — Telephone Encounter (Signed)
From: Orvis Brill  To: Dr. Geradine Girt  Sent: 08/23/2022 12:01 AM EDT  Subject: Medication    I need the following medication refilled tomorrow, Wed, 08/23/22, as I will be going out of town on Thurs evening. The pharmacy has sent alerts for these medications, but they have not received the orders.    Famotidine 40 mg - 90 day supply  Myrbetriq 50 mg - 90 day supply    Thank you for your timely action on this.    Arrie Aran for  [employer]

## 2022-08-25 ENCOUNTER — Encounter

## 2022-08-25 MED ORDER — MIRABEGRON ER 50 MG PO TB24
50 MG | ORAL_TABLET | Freq: Every day | ORAL | 1 refills | Status: AC
Start: 2022-08-25 — End: 2023-03-15

## 2022-08-25 MED ORDER — FAMOTIDINE 40 MG PO TABS
40 MG | ORAL_TABLET | Freq: Two times a day (BID) | ORAL | 2 refills | Status: AC
Start: 2022-08-25 — End: ?

## 2022-08-25 NOTE — Progress Notes (Signed)
Mybetriq and pepcid refilled. Chronic meds  Lorenda Ishihara, MD

## 2022-09-05 ENCOUNTER — Ambulatory Visit
Admit: 2022-09-05 | Discharge: 2022-09-05 | Payer: MEDICARE | Primary: Student in an Organized Health Care Education/Training Program

## 2022-09-05 DIAGNOSIS — R300 Dysuria: Secondary | ICD-10-CM

## 2022-09-05 DIAGNOSIS — H9193 Unspecified hearing loss, bilateral: Secondary | ICD-10-CM

## 2022-09-05 DIAGNOSIS — E1121 Type 2 diabetes mellitus with diabetic nephropathy: Secondary | ICD-10-CM

## 2022-09-05 LAB — AMB POC URINALYSIS DIP STICK AUTO W/O MICRO
Bilirubin, Urine, POC: NEGATIVE
Blood, Urine, POC: NEGATIVE
Ketones, Urine, POC: NEGATIVE
Leukocyte Esterase, Urine, POC: NEGATIVE
Nitrite, Urine, POC: NEGATIVE
Protein, Urine, POC: NEGATIVE
Specific Gravity, Urine, POC: 1.015 (ref 1.001–1.035)
Urobilinogen, POC: 0.2
pH, Urine, POC: 6 (ref 4.6–8.0)

## 2022-09-05 MED ORDER — METFORMIN HCL 500 MG PO TABS
500 MG | ORAL_TABLET | Freq: Two times a day (BID) | ORAL | 1 refills | Status: AC
Start: 2022-09-05 — End: ?

## 2022-09-05 NOTE — Progress Notes (Unsigned)
Medicare Annual Wellness Visit    Chelsea Schultz is here for No chief complaint on file.    Assessment & Plan   {There are no diagnoses linked to this encounter. (Refresh or delete this SmartLink)}  Recommendations for Preventive Services Due: see orders and patient instructions/AVS.  Recommended screening schedule for the next 5-10 years is provided to the patient in written form: see Patient Instructions/AVS.     No follow-ups on file.     Subjective   {OPTIONAL - WILL AUTO-DELETE IF NOT MWUX:[id number]}  History given by daughter of patient. Pt is primarily taken care of another daughter of the patient.     #HTN: BP today 149/70. *** Need to remeasure. Home blood pressures measured daily, 130s/60-70s. Has history of stroke. Atorv 10 ***  Home meds: amlodipine 10, carvedilol 3.125mg  BID, losartan 100mg   Do not need refills at this time.     #Diabetes:  Last a1c 6.8.   Med refill of metformin 500 BID    #Hearing loss  For a few years. Worsening bilaterally.   Had prior ENT referral without follow up.    #Constipation  Had gone 5-7 days without bowel movement. So restarted miralax last week. Now very loose stools with miralax after starting that. Currently taking miralax, docusate and metamucil.  - Increase water intake  - metamucil daily then adjust miralax  - dc docusate    #Burning with urination  At times itching. Has Chronic UTI. No fever / abdominal pain. Not on any maintenance medication *** Unable to gain ROS from patient. Recent antibiotic dose. Last UTI was 9/27 and had a keflex. No suprapublic tenderness.No CVA tenderness.     # Urinary incontinence  Urologist (Dr. Lucy Antigua) regularly.   Trospium ***    #Flu vaccine  Will obtain at pharmacy    Patient's complete Health Risk Assessment and screening values have been reviewed and are found in Flowsheets. The following problems were reviewed today and where indicated follow up appointments were made and/or referrals ordered.    Positive Risk Factor  Screenings with Interventions:    Fall Risk:  Do you feel unsteady or are you worried about falling? : (!) yes  2 or more falls in past year?: (!) yes  Fall with injury in past year?: no     Interventions:    {Fall Interventions:201460083}  Walks with walker at home. At times wheelchair.   Has slides down to the floor at home. Around once every month or two months. Legs feel week.                Dentist Screen:  Have you seen the dentist within the past year?: (!) No    Intervention:  Advised to schedule with their dentist    Hearing Screen:  Do you or your family notice any trouble with your hearing that hasn't been managed with hearing aids?: (!) Yes    Interventions:  Referred to ENT      ADL's:   Patient reports needing help with:  Select all that apply: (!) Bathing, Walking/Balance  Select all that apply: [employer], Housekeeping, Banking/Finances, Shopping, Telephone Use, Museum/gallery conservator, Transportation, Taking Medications    Interventions:  Has a caregiver at home daily      {OPTIONAL- LDCT, CVD, STI Counseling [phone]}          Objective   There were no vitals filed for this visit.   There is no height or weight on file to calculate BMI.      {  OPTIONAL - GENERAL PHYSICAL EXAM (WILL AUTO-DELETE IF NOT ZOXW):[id number]}       Allergies   Allergen Reactions    Nitrofurantoin Rash     Was placed on it 09/2019 and pt noted rash on her hand immediately after starting antibx. Will add it back to her allergy list.     Prior to Visit Medications    Medication Sig Taking? Authorizing Provider   famotidine (PEPCID) 40 MG tablet Take 0.5 tablets by mouth in the morning and at bedtime ceived the following from [facility]: Outside name: famotidine (PEPCID) 40 mg tablet Yes Delight Ovens, MD   mirabegron (MYRBETRIQ) 50 MG TB24 Take 50 mg by mouth daily Yes Delight Ovens, MD   metFORMIN (GLUCOPHAGE) 500 MG tablet Take 1 tablet by mouth 2 times daily (with meals) Yes Laurann Montana, MD    amLODIPine (NORVASC) 10 MG tablet TAKE 1 TABLET(10 MG) BY MOUTH EVERY NIGHT Yes Laurann Montana, MD   carvedilol (COREG) 3.125 MG tablet Take 1 tablet by mouth 2 times daily Yes Laurann Montana, MD   losartan (COZAAR) 100 MG tablet Take 1 tablet by mouth daily Yes Delight Ovens, MD   pregabalin (LYRICA) 100 MG capsule TAKE 1 CAPSULE BY MOUTH DAILY AS NEEDED FOR NEUROPATHY Yes Carmina Miller, MD   acetaminophen (TYLENOL) 650 MG extended release tablet Take 1 tablet by mouth daily as needed Yes Automatic Reconciliation, Ar   ascorbic acid (VITAMIN C) 1000 MG tablet Take 1 tablet by mouth daily Yes Automatic Reconciliation, Ar   aspirin 81 MG EC tablet ceived the following from [facility]: Outside name: aspirin delayed-release 81 mg tablet Yes Automatic Reconciliation, Ar   atorvastatin (LIPITOR) 10 MG tablet Take 1 tablet by mouth daily Yes Automatic Reconciliation, Ar   diclofenac sodium (VOLTAREN) 1 % GEL Apply 2 g topically 4 times daily Yes Automatic Reconciliation, Ar   docusate (COLACE, DULCOLAX) 100 MG CAPS Take 100 mg by mouth daily Yes Automatic Reconciliation, Ar   Estradiol (VAGIFEM) 10 MCG TABS vaginal tablet Place 1 tablet vaginally Yes Automatic Reconciliation, Ar   fosfomycin tromethamine (MONUROL) 3 g PACK TAKE 1 PACKET MIXED IN 3-4 OUNCES OF WATER ONCE Yes Automatic Reconciliation, Ar   trospium (SANCTURA) 20 MG tablet Take 1 tablet by mouth daily  Patient not taking: Reported on 09/05/2022  [provider]   aspirin-dipyridamole (AGGRENOX) 25-200 MG per extended release capsule Take 1 capsule by mouth 2 times daily  Patient not taking: Reported on 09/05/2022  Automatic Reconciliation, Ar   calcium carb-cholecalciferol 600-10 MG-MCG TABS per tab Take 2 tablets by mouth daily  Patient not taking: Reported on 09/05/2022  Automatic Reconciliation, Ar   nizatidine (AXID) 150 MG capsule Take 1 capsule by mouth as needed  Patient not taking: Reported on 09/05/2022  Automatic  Reconciliation, Ar   polyethylene glycol (GLYCOLAX) 17 GM/SCOOP powder Take 17 g by mouth daily as needed  Patient not taking: Reported on 09/05/2022  Automatic Reconciliation, Ar       CareTeam (Including outside providers/suppliers regularly involved in providing care):   Patient Care Team:  Carmina Miller, MD as PCP - General     Reviewed and updated this visit:  Allergies  Meds

## 2022-09-05 NOTE — Progress Notes (Unsigned)
Chelsea Schultz is a 88 y.o. female    No chief complaint on file.      1. Have you been to the ER, urgent care clinic since your last visit?  Hospitalized since your last visit?no    2. Have you seen or consulted any other health care providers outside of the [facility] since your last visit?  Include any pap smears or colon screening. no    Vitals:    09/05/22 0852   BP: (!) 149/70   Pulse: 83   Resp: 14   Temp: 98.4 F (36.9 C)   SpO2: 96%          No data to display              Health Maintenance Due   Topic Date Due    DTaP/Tdap/Td vaccine (1 - Tdap) Never done    Shingles vaccine (1 of 2) Never done    Hepatitis B vaccine (1 of 3 - Risk 3-dose series) Never done    Lipids  09/24/2019    Annual Wellness Visit (AWV)  Never done    Pneumococcal 65+ years Vaccine (2 - PCV) 11/16/2021    Flu vaccine (1) 05/23/2022    COVID-19 Vaccine (4 - 2023-24 season) 06/23/2022

## 2022-09-06 LAB — URINALYSIS WITH REFLEX TO CULTURE
Bilirubin Urine: NEGATIVE
Blood, Urine: NEGATIVE
Glucose, UA: 500 mg/dL — AB
Ketones, Urine: NEGATIVE mg/dL
Leukocyte Esterase, Urine: NEGATIVE
Nitrite, Urine: NEGATIVE
Protein, UA: NEGATIVE mg/dL
Specific Gravity, UA: 1.011 (ref 1.003–1.030)
Urobilinogen, Urine: 0.2 EU/dL (ref 0.2–1.0)
pH, Urine: 6 (ref 5.0–8.0)

## 2022-09-07 ENCOUNTER — Encounter

## 2022-09-07 MED ORDER — FOSFOMYCIN TROMETHAMINE 3 G PO PACK
3 g | Freq: Once | ORAL | 0 refills | Status: AC
Start: 2022-09-07 — End: 2022-09-07

## 2022-09-08 LAB — CULTURE, URINE: Colony count: >100000

## 2022-09-12 NOTE — Telephone Encounter (Signed)
Reviewed final results of urine culture. Called pt's daughter Chelsea Schultz) who confirmed pt has responded well to fosfomycin and at this time without urinary symptoms.   Sabino Dick, MD- Covering inbox of Dr. Gayla Doss.   09/12/22

## 2022-09-15 ENCOUNTER — Telehealth

## 2022-09-15 MED ORDER — PREGABALIN 100 MG PO CAPS
100 MG | ORAL_CAPSULE | ORAL | 2 refills | Status: AC
Start: 2022-09-15 — End: 2023-09-14

## 2022-09-15 NOTE — Telephone Encounter (Signed)
Received call that patient almost out of Lyrica 100mg . Pt seen in office 09/05/22. Has known DM with neuropathy. Reviewed PDMP, appropriate. Will send refills.  Sabino Dick, MD (covering inbox for Dr. Harrington Challenger)  09/15/22

## 2022-09-15 NOTE — Progress Notes (Signed)
I saw and evaluated the patient, performing the key elements of the service.  I discussed the findings, assessment and plan with the resident and agree with the resident's findings and plan as documented in the resident's note.

## 2022-09-15 NOTE — Telephone Encounter (Signed)
Spoke to daughter and relayed the message about not being able to send the medication to [place]. She stated she understood and would like to pick up the medication at [facility]. She also stated that the patient would possibly not be able to get a controlled substance refill b/c patient [place] of [place]. I sent a message to the dr letting them know she would like to get the medication at the local [facility] and the patient would have it on Sunday when she is back in town.

## 2022-09-15 NOTE — Telephone Encounter (Signed)
Patient is out of medication. She was just seen and they forgot to let us know she was running out.    pregabalin (LYRICA) 100 MG capsule [[id number]]     Order Details  Dose, Route, Frequency: As Directed   Dispense Quantity: 90 capsule Refills: 2          Sig: TAKE 1 CAPSULE BY MOUTH DAILY AS NEEDED FOR NEUROPATHY     Needs to be sent to: [facility] #[id number] [facility], [place] - [place] - F [phone]

## 2022-09-29 ENCOUNTER — Encounter
Payer: MEDICARE | Attending: Student in an Organized Health Care Education/Training Program | Primary: Student in an Organized Health Care Education/Training Program

## 2022-10-13 ENCOUNTER — Encounter
Payer: MEDICARE | Attending: Student in an Organized Health Care Education/Training Program | Primary: Student in an Organized Health Care Education/Training Program

## 2022-10-19 ENCOUNTER — Ambulatory Visit: Payer: MEDICARE | Primary: Student in an Organized Health Care Education/Training Program

## 2022-10-21 IMAGING — CT CT HEAD W/O CM
4 series · 16 of 47 positions shown, 18 images · non-contrast
Comparison: None.

CLINICAL DATA: Weakness for 1 day, right knee pain for 3 days

EXAM:
CT HEAD WITHOUT CONTRAST
TECHNIQUE: Contiguous axial images were obtained from the base of the skull
through the vertex without intravenous contrast.

[Series 3: head bone · axial · 0.42mm/px · z∈[-191,-157]mm · 3 of 87 slices shown]
[im 9/87  bone]
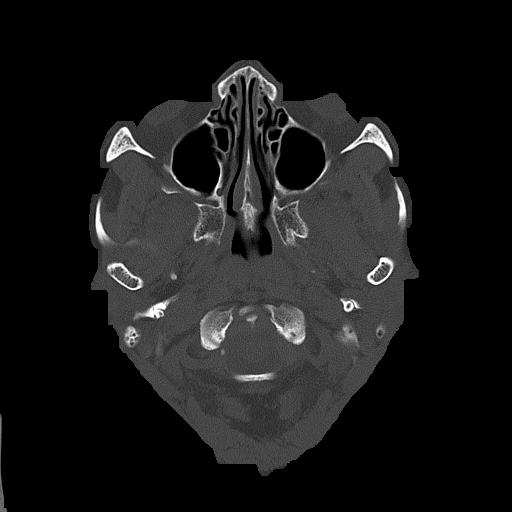
[im 18/87  bone]
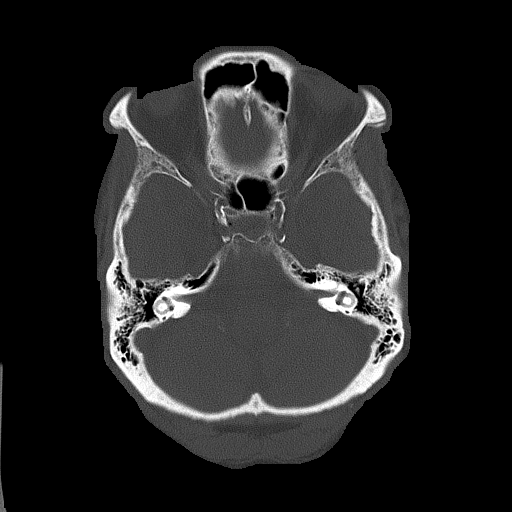
[im 26/87  bone]
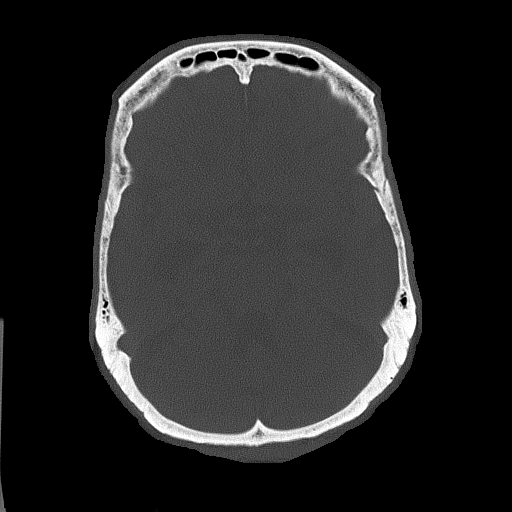

[Series 4: head without · axial · non-contrast · 0.42mm/px · z∈[-187,-62]mm · 7 of 35 slices shown, 9 images]
[im 5/35  brain]
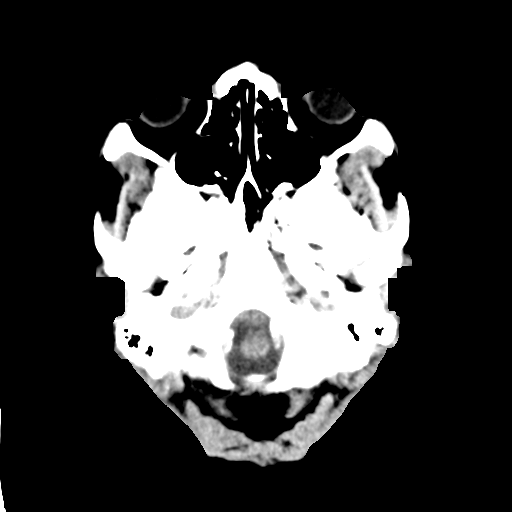
[im 5/35  bone]
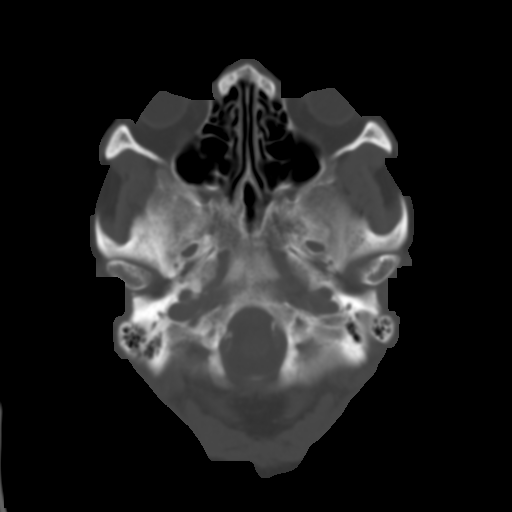
[im 9/35  brain]
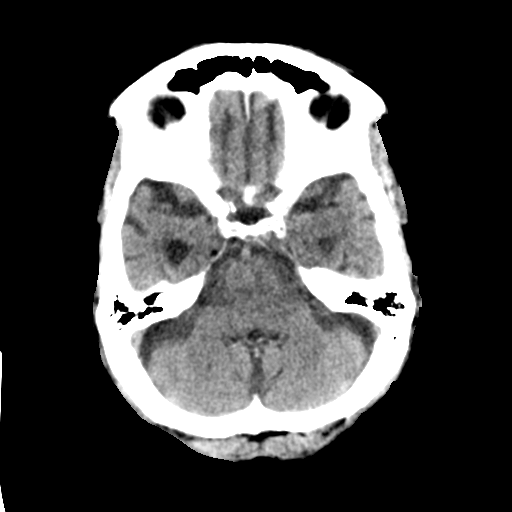
[im 13/35  brain]
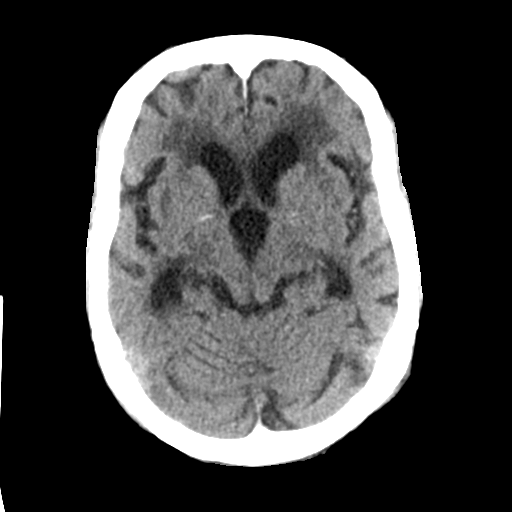
[im 18/35  brain]
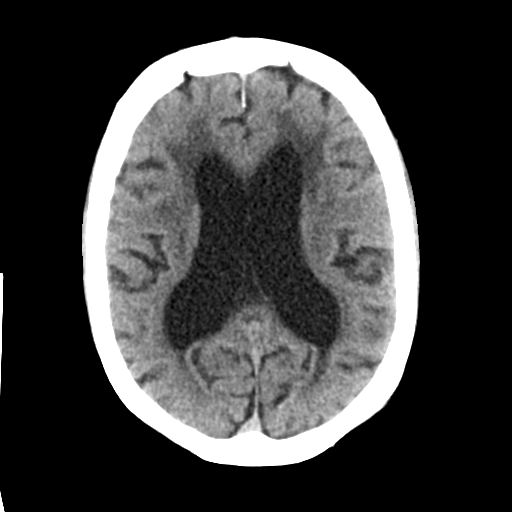
[im 22/35  brain]
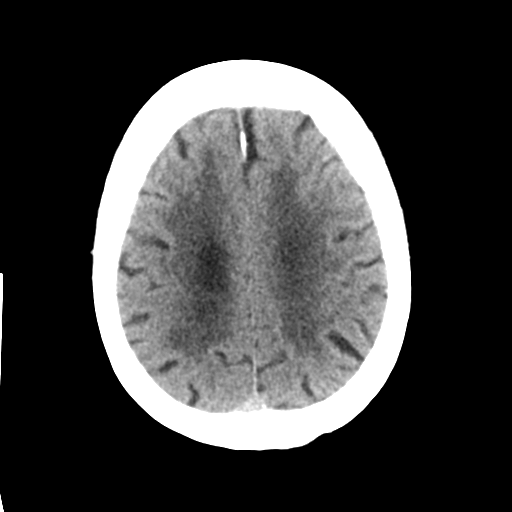
[im 22/35  bone]
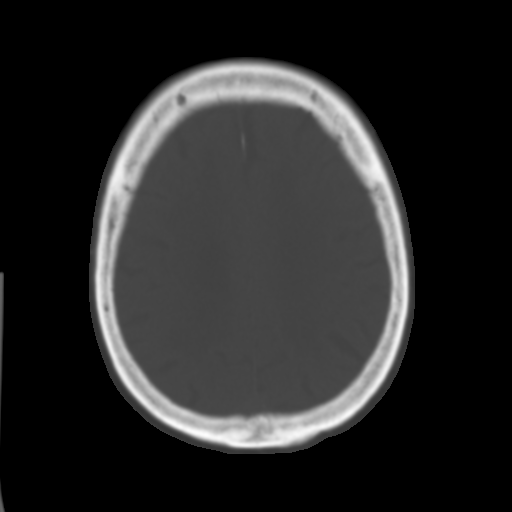
[im 26/35  brain]
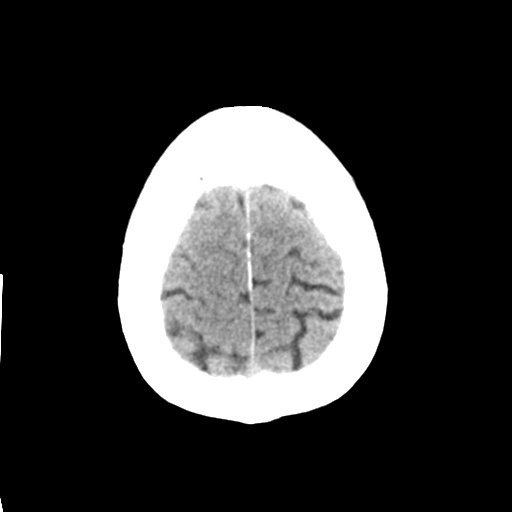
[im 30/35  brain]
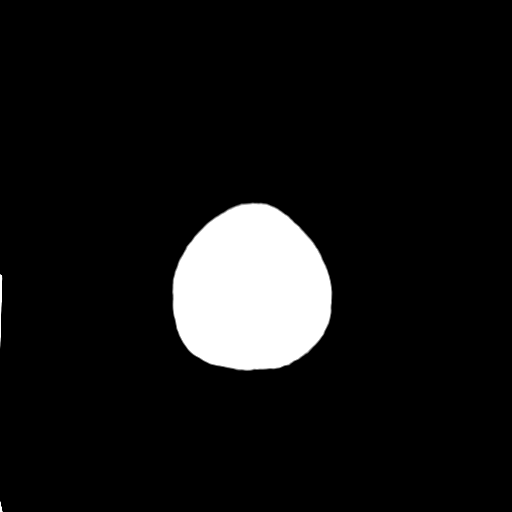

[Series 5: head without cor · coronal · non-contrast · 0.34mm/px · 3 of 67 slices shown]
[im 23/67  brain]
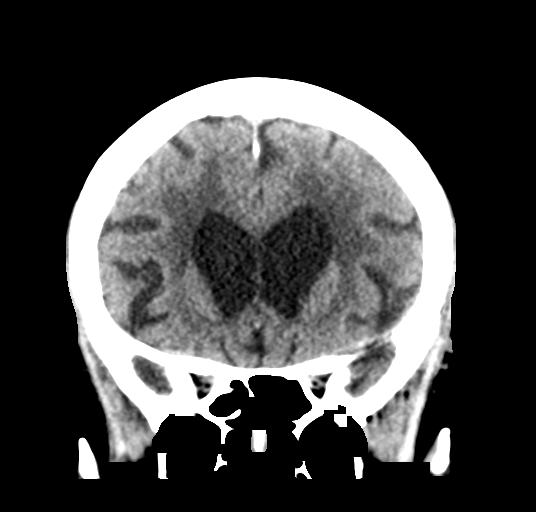
[im 30/67  brain]
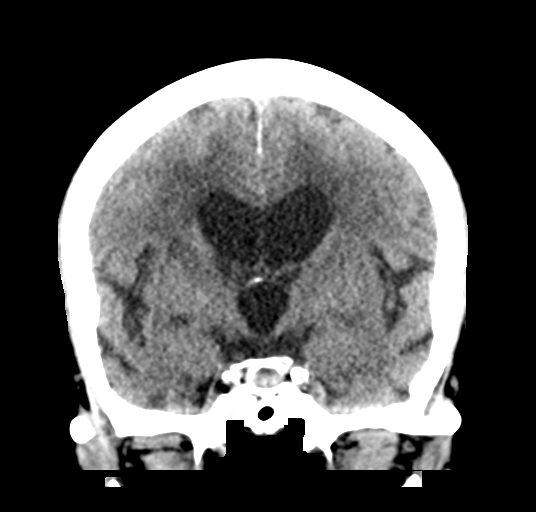
[im 37/67  brain]
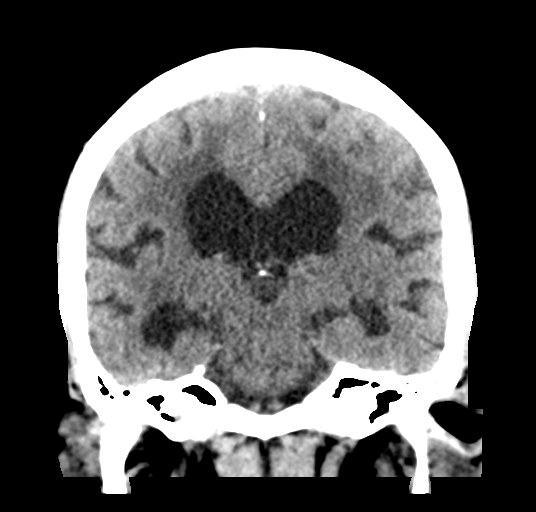

[Series 6: head without sag · sagittal · non-contrast · 0.34mm/px · 3 of 64 slices shown]
[im 22/64  brain]
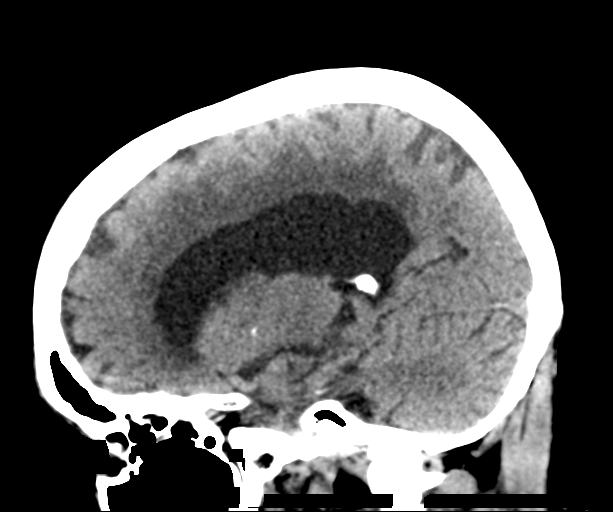
[im 32/64  brain]
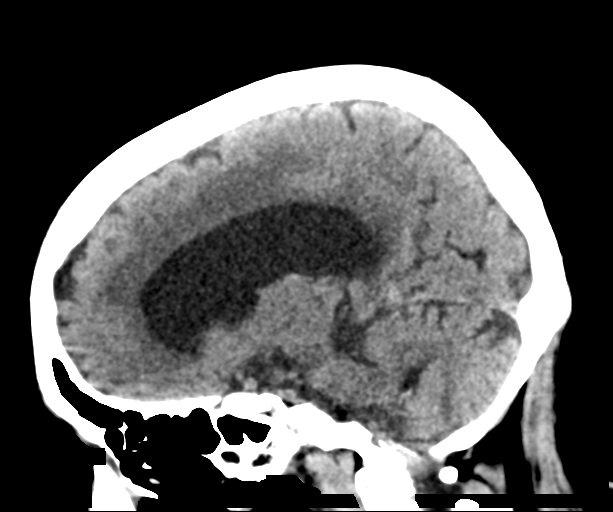
[im 43/64  brain]
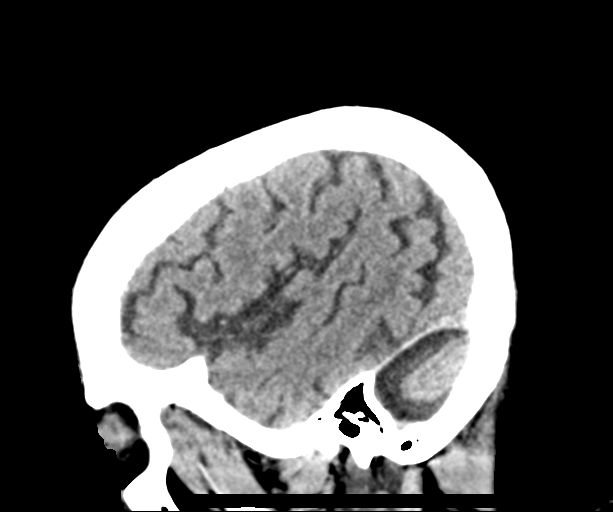

[16 of 47 positions shown; findings below may reference images not displayed]

FINDINGS: Brain: Confluent hypodensities are seen throughout the
periventricular and subcortical white matter, as well as
hypodensities within the central pons, are consistent with
age-indeterminate small vessel ischemic changes, favor chronic. No
other signs of acute infarct or hemorrhage. There is mild diffuse
cerebral atrophy. Prominence of the lateral ventricles is out of
proportion to the degree of cerebral atrophy and could signify
underlying normal pressure hydrocephalus. No acute extra-axial fluid
collections. No mass effect.

Vascular: No hyperdense vessel or unexpected calcification.

Skull: Normal. Negative for fracture or focal lesion.

Sinuses/Orbits: No acute finding.

Other: None.
IMPRESSION: 1. Hypodensities throughout the periventricular white matter,
subcortical white matter, and central pons, favor chronic small
vessel ischemic change.
2. Otherwise no acute intracranial process.
3. Prominence of the lateral ventricles out of proportion to the
degree of cerebral atrophy, which could signify underlying normal
pressure hydrocephalus.

## 2022-10-30 ENCOUNTER — Ambulatory Visit
Payer: MEDICARE | Attending: Student in an Organized Health Care Education/Training Program | Primary: Student in an Organized Health Care Education/Training Program

## 2022-11-01 NOTE — Telephone Encounter (Addendum)
Hailey, Medical Student with [facility] called to inform you that pt has been under their care. Pt was seen for UTI with AKI and altered mental status. She will need repeat labs and a hospital follow up within the next week. Hailey agreed to have the chart notes faxed to [phone].    This Probation officer contacted pt's daughter, Holley Bouche, whom pt is currently staying with in [place] (per Hailey) at [phone]. Leda Gauze stated that she is driving and will call back to schedule the appt.

## 2022-11-10 ENCOUNTER — Encounter

## 2022-11-11 ENCOUNTER — Encounter

## 2022-11-13 MED ORDER — AMLODIPINE BESYLATE 10 MG PO TABS
10 MG | ORAL_TABLET | ORAL | 1 refills | Status: AC
Start: 2022-11-13 — End: ?

## 2022-11-20 ENCOUNTER — Encounter
Payer: MEDICARE | Attending: Student in an Organized Health Care Education/Training Program | Primary: Student in an Organized Health Care Education/Training Program

## 2022-12-12 NOTE — Telephone Encounter (Signed)
From: Orvis Brill  To: Dr. Geradine Girt  Sent: 12/11/2022 7:37 PM EST  Subject: Pharmacy requested Med Refill w/no response - Please Order    Please send e-script to pharmacy, [facility], for refill for Candi Leash of Atorvastatin 25m - 90 day supply. Pharmacy has granted 2 emergency refills as she was out of medication and they have not received reply for you or anyone on Xiadani's Team.     I appreciate your taking care of this on tomorrow, Tues, 2/20, at your very earliest convenience.     Thank you,  CArrie Aran Daughter, Medical POA, [facility]

## 2022-12-12 NOTE — Telephone Encounter (Signed)
Medication Refill Request    Chelsea Schultz is requesting a refill of the following medication(s):   Requested Prescriptions     Pending Prescriptions Disp Refills    atorvastatin (LIPITOR) 10 MG tablet 90 tablet 3     Sig: Take 1 tablet by mouth daily        Listed PCP is Gerrit Halls, MD   Last provider to prescribe medication: Dr. Lovette Cliche  Last Date of Medication Prescribed: 06/17/2018   Date of Last Office Visit at [facility]: 06/19/2022   Last Labs:  Lab Results   Component Value Date    CHOL 142 09/23/2018    TRIG 73 09/23/2018    HDL 56 09/23/2018    LDLCALC 71.4 09/23/2018    CHOLHDLRATIO 2.5 09/23/2018       Future Appointment: No future appointments.    Refill Request Note: N/A    Please send refill to:    [facility], [place] - F [phone]  [place] [address]  Phone: [phone] Fax: [phone]    [facility] [place], [place] - [place] [phone] - F [phone]  [place] [address]  Phone: [phone] Fax: [phone]    [facility] [address] [place] [phone] - F [phone]  [place] [address]  Phone: [phone] Fax: [phone]

## 2022-12-13 MED ORDER — ATORVASTATIN CALCIUM 10 MG PO TABS
10 MG | ORAL_TABLET | Freq: Every day | ORAL | 0 refills | Status: AC
Start: 2022-12-13 — End: 2023-03-14

## 2022-12-20 NOTE — Telephone Encounter (Signed)
This writer attempted to contact pt to schedule an appt. Pt's daughter answered phone and confirmed pt's information. She refused to schedule an appt, by stating that "she is not coming in for medication. We have the medication now." This writer asked if there was anything that this writer could assist her with. She stated "you called me." Phone conversation ended.

## 2023-01-08 NOTE — Telephone Encounter (Signed)
-----   Message from Gerrit Halls, MD sent at 12/13/2022  1:12 PM EST -----  Regarding: Schedule Follow Up  [name],     Would you please be able to call this patient to make sure they have a follow up set in the next couple months? They need repeat blood work prior to additional medication refills.     Thanks!  - Gerrit Halls

## 2023-01-08 NOTE — Telephone Encounter (Signed)
Hi Dr. Lovette Cliche,    Spoke with daughter to try to get Mrs. Lozinski scheduled for an appointment. I relayed message word from word to daughter and she stated she did not understand what the appointment is for. I relayed the message again and daughter stated that she understood that but she needed more information as to what exactly this appointment is for. I did not know how to answer the daughter question since I gave her the information that was provided in the message. She asked if she was due for a medicare wellness but according to her chart she is not due until August. She stated that she came in before and was told that her mom did not need to come in. Daughter would like more information before she schedule an appointment.

## 2023-02-03 ENCOUNTER — Encounter

## 2023-02-05 MED ORDER — CARVEDILOL 3.125 MG PO TABS
3.125 | ORAL_TABLET | Freq: Two times a day (BID) | ORAL | 1 refills | Status: AC
Start: 2023-02-05 — End: ?

## 2023-03-10 ENCOUNTER — Encounter

## 2023-03-12 NOTE — Telephone Encounter (Signed)
Medication Refill Request    Chelsea Schultz is requesting a refill of the following medication(s):   Requested Prescriptions     Pending Prescriptions Disp Refills    MYRBETRIQ 50 MG TB24 [Pharmacy Med Name: MYRBETRIQ 50MG  TABLETS] 90 tablet 1     Sig: TAKE 1 TABLET BY MOUTH DAILY        Listed PCP is Carmina Miller, MD   Last provider to prescribe medication: Remigio Eisenmenger  Last Date of Medication Prescribed: 08/25/2022  Date of Last Office Visit at [facility]: 09/05/2022  FUTURE APPOINTMENT: No future appointments.    Please send refill to:    [facility] #[id number] - [place], [place] - [name] - P [phone] Carmon Ginsberg [phone]  [name]  [place] [address]  Phone: [phone] Fax: [phone]    Please review request and approve or deny with recommendations.

## 2023-03-14 NOTE — Telephone Encounter (Signed)
Medication Refill Request    Chelsea Schultz is requesting a refill of the following medication(s):   Requested Prescriptions     Pending Prescriptions Disp Refills    atorvastatin (LIPITOR) 10 MG tablet 30 tablet 0     Sig: Take 1 tablet by mouth daily Will need repeat blood work prior to additional refills.        Listed PCP is Carmina Miller, MD   Last provider to prescribe medication: Dr. Cristela Felt on 12/13/22  Date of Last Office Visit at [facility]: 09/05/22 with Dr. Tsosie Billing  FUTURE APPOINTMENT: No future appointments.    Please send refill to:    [facility] #[id number] - [place], [place] - [name] - P [phone] - F [phone]  [name]  [place] [address]  Phone: [phone] Fax: [phone]    [facility] DRUG STORE #[id number] - [address] [place] - [address] - P [phone] - F [phone]  [address]  [place] [address]  Phone: [phone] Fax: [phone]    [facility] [address], [place] - [address] - [place] [phone] - F [phone]  [address]  [place] [address]  Phone: [phone] Fax: [phone]      Please review request and approve or deny with recommendations within 48 hours.

## 2023-03-14 NOTE — Telephone Encounter (Signed)
From: Dellis Filbert  To: Carmina Miller, MD  Sent: 03/13/2023 7:54 PM EDT  Subject: Medication Refill - Pharmacy has contacted x2 No Response    Nahiara Eckberg, birthdate 12/19/2025, has been out of Myrbetric since Saturday. Pharmacy has made 2 contacts for re-order with no response, and have granted 2 Emergency supplies while awaiting order for refill. They have also contacted for Atorvastatin.    Please re-order these meds ASAP as emergency supply will only last through Wed, then will be out again on Thursday.     Thank you for your immediate attention to this matter.    Lucius Conn,  daughter, [facility]

## 2023-03-14 NOTE — Telephone Encounter (Deleted)
Medication Refill Request    Chelsea Schultz is requesting a refill of the following medication(s):   Requested Prescriptions     Pending Prescriptions Disp Refills    atorvastatin (LIPITOR) 10 MG tablet 90 tablet 0     Sig: Take 1 tablet by mouth daily Will need repeat blood work prior to additional refills.        Listed PCP is Carmina Miller, MD   Last provider to prescribe medication: Dr. Cristela Felt on 12/13/22  Date of Last Office Visit at [facility]: 09/05/22 with Dr. Tsosie Billing  FUTURE APPOINTMENT: No future appointments.    Please send refill to:    [facility]UG STORE #[id number] - [place], [place] - [name] - P [phone] - F [phone]  [name]  [place] [address]  Phone: [phone] Fax: [phone]    [facility] DRUG STORE #[id number] - [address] [place] - [address] - P [phone] - F [phone]  [address]  [place] [address]  Phone: [phone] Fax: [phone]    [facility] [address], [place] - [address] - [place] [phone] - F [phone]  [address]  [place] [address]  Phone: [phone] Fax: [phone]      Please review request and approve or deny with recommendations within 48 hours.

## 2023-03-15 MED ORDER — ATORVASTATIN CALCIUM 10 MG PO TABS
10 | ORAL_TABLET | Freq: Every day | ORAL | 0 refills | Status: DC
Start: 2023-03-15 — End: 2023-04-11

## 2023-03-15 MED ORDER — MYRBETRIQ 50 MG PO TB24
50 | ORAL_TABLET | Freq: Every day | ORAL | 1 refills | Status: AC
Start: 2023-03-15 — End: ?

## 2023-04-09 NOTE — Telephone Encounter (Signed)
Prescription refill request has been sent to your provider for approval.

## 2023-04-09 NOTE — Telephone Encounter (Deleted)
Prescription refill request has been sent to your provider for approval.

## 2023-04-11 MED ORDER — ATORVASTATIN CALCIUM 10 MG PO TABS
10 | ORAL_TABLET | ORAL | 0 refills | Status: AC
Start: 2023-04-11 — End: ?

## 2023-04-29 ENCOUNTER — Encounter

## 2023-04-30 ENCOUNTER — Encounter

## 2023-05-01 NOTE — Telephone Encounter (Signed)
Medication Refill Request    Chelsea Schultz is requesting a refill of the following medication(s):   Requested Prescriptions     Pending Prescriptions Disp Refills    losartan (COZAAR) 100 MG tablet 90 tablet 1     Sig: Take 1 tablet by mouth daily        Listed PCP is Carmina Miller, MD   Last provider to prescribe medication: Remigio Eisenmenger  Last Date of Medication Prescribed: 07/26/2022  Date of Last Office Visit at [facility]: 09/05/2022  FUTURE APPOINTMENT: No future appointments.    Please send refill to:    [facility] DR[facility] STORE #[id number] - [place], [place] - [name] - P [phone] Carmon Ginsberg [phone]  [name]  [place] [address]  Phone: [phone] Fax: [phone]    Please review request and approve or deny with recommendations.

## 2023-05-14 NOTE — Telephone Encounter (Signed)
Pt's daughter called to schedule an appt, but stated that pt is also due for her AWV. Pt's last AWV was on 06/19/22 and has been scheduled for 06/21/23. Pt's daughter stated that due to transportation issues for pt, she prefers 1 appt (which was the AWV) but pt needed refills to last until appt. This Clinical research associate discussed with Thayer Ohm, due to encounter written on 7/15 stating that "Dr. Cristela Felt stated that he will no longer supply any refills until patient is seen in office." Thayer Ohm reiterated that Dr. Cristela Felt will not send refill without an appt. Pt's daughter was informed that an appt is need, aside from AWV. She replied that will send a direct message to Dr. Cristela Felt to address.

## 2023-06-21 ENCOUNTER — Ambulatory Visit: Payer: Medicare Other | Attending: Student in an Organized Health Care Education/Training Program

## 2023-06-26 ENCOUNTER — Ambulatory Visit: Admit: 2023-06-26 | Discharge: 2023-06-26 | Payer: MEDICARE

## 2023-06-26 VITALS — BP 152/83 | HR 88 | Temp 97.50000°F | Resp 18 | Ht 59.0 in | Wt 90.0 lb

## 2023-06-26 DIAGNOSIS — Z0000 Encounter for general adult medical examination without abnormal findings: Secondary | ICD-10-CM

## 2023-06-26 MED ORDER — ATORVASTATIN CALCIUM 10 MG PO TABS
10 | ORAL_TABLET | Freq: Every day | ORAL | 3 refills | Status: DC
Start: 2023-06-26 — End: 2024-07-01

## 2023-06-26 MED ORDER — LOSARTAN POTASSIUM 100 MG PO TABS
100 | ORAL_TABLET | Freq: Every day | ORAL | 3 refills | Status: DC
Start: 2023-06-26 — End: 2024-08-01

## 2023-06-26 MED ORDER — AMLODIPINE BESYLATE 10 MG PO TABS
10 | ORAL_TABLET | ORAL | 3 refills | Status: DC
Start: 2023-06-26 — End: 2024-07-17

## 2023-06-26 MED ORDER — PREGABALIN 100 MG PO CAPS
100 MG | ORAL_CAPSULE | ORAL | 3 refills | Status: DC
Start: 2023-06-26 — End: 2024-03-13

## 2023-06-26 MED ORDER — MIRABEGRON ER 50 MG PO TB24
50 | ORAL_TABLET | Freq: Every day | ORAL | 3 refills | Status: DC
Start: 2023-06-26 — End: 2024-11-03

## 2023-06-26 MED ORDER — METFORMIN HCL 500 MG PO TABS
500 | ORAL_TABLET | Freq: Two times a day (BID) | ORAL | 3 refills | Status: DC
Start: 2023-06-26 — End: 2024-08-01

## 2023-06-26 MED ORDER — FAMOTIDINE 40 MG PO TABS
40 | ORAL_TABLET | Freq: Two times a day (BID) | ORAL | 3 refills | Status: DC
Start: 2023-06-26 — End: 2024-08-21

## 2023-06-26 MED ORDER — CARVEDILOL 3.125 MG PO TABS
3.125 | ORAL_TABLET | Freq: Two times a day (BID) | ORAL | 3 refills | 30.00000 days | Status: DC
Start: 2023-06-26 — End: 2024-08-01

## 2023-06-26 NOTE — Patient Instructions (Signed)
 A Healthy Heart: Care Instructions  Overview     Coronary artery disease, also called heart disease, occurs when a substance called plaque builds up in the vessels that supply oxygen-rich blood to your heart muscle. This can narrow the blood vessels and reduce blood flow. A heart attack happens when blood flow is completely blocked. A high-fat diet, smoking, and other factors increase the risk of heart disease.  Your doctor has found that you have a chance of having heart disease. A heart-healthy lifestyle can help keep your heart healthy and prevent heart disease. This lifestyle includes eating healthy, being active, staying at a weight that's healthy for you, and not smoking or using tobacco. It also includes taking medicines as directed, managing other health conditions, and trying to get a healthy amount of sleep.  Follow-up care is a key part of your treatment and safety. Be sure to make and go to all appointments, and call your doctor if you are having problems. It's also a good idea to know your test results and keep a list of the medicines you take.  How can you care for yourself at home?  Diet   Use less salt when you cook and eat. This helps lower your blood pressure. Taste food before salting. Add only a little salt when you think you need it. With time, your taste buds will adjust to less salt.    Eat fewer snack items, fast foods, canned soups, and other high-salt, high-fat, processed foods.    Read food labels and try to avoid saturated and trans fats. They increase your risk of heart disease by raising cholesterol levels.    Limit the amount of solid fat--butter, margarine, and shortening--you eat. Use olive, peanut, or canola oil when you cook. Bake, broil, and steam foods instead of frying them.    Eat a variety of fruit and vegetables every day. Dark green, deep orange, red, or yellow fruits and vegetables are especially good for you. Examples include spinach, carrots, peaches, and  berries.    Foods high in fiber can reduce your cholesterol and provide important vitamins and minerals. High-fiber foods include whole-grain cereals and breads, oatmeal, beans, brown rice, citrus fruits, and apples.    Eat lean proteins. Heart-healthy proteins include seafood, lean meats and poultry, eggs, beans, peas, nuts, seeds, and soy products.    Limit drinks and foods with added sugar. These include candy, desserts, and soda pop.   Heart-healthy lifestyle   If your doctor recommends it, get more exercise. For many people, walking is a good choice. Or you may want to swim, bike, or do other activities. Bit by bit, increase the time you're active every day. Try for at least 30 minutes on most days of the week.    Try to quit or cut back on using tobacco and other nicotine products. This includes smoking and vaping. If you need help quitting, talk to your doctor about stop-smoking programs and medicines. These can increase your chances of quitting for good. Quitting is one of the most important things you can do to protect your heart. It is never too late to quit. Try to avoid secondhand smoke too.    Stay at a weight that's healthy for you. Talk to your doctor if you need help losing weight.    Try to get 7 to 9 hours of sleep each night.    Limit alcohol to 2 drinks a day for men and 1 drink a day for women. Too  much alcohol can cause health problems.    Manage other health problems such as diabetes, high blood pressure, and high cholesterol. If you think you may have a problem with alcohol or drug use, talk to your doctor.   Medicines   Take your medicines exactly as prescribed. Call your doctor if you think you are having a problem with your medicine.    If your doctor recommends aspirin, take the amount directed each day. Make sure you take aspirin and not another kind of pain reliever, such as acetaminophen (Tylenol).   When should you call for help?   Call 911 if you have symptoms of a heart  attack. These may include:   Chest pain or pressure, or a strange feeling in the chest.    Sweating.    Shortness of breath.    Pain, pressure, or a strange feeling in the back, neck, jaw, or upper belly or in one or both shoulders or arms.    Lightheadedness or sudden weakness.    A fast or irregular heartbeat.   After you call 911, the operator may tell you to chew 1 adult-strength or 2 to 4 low-dose aspirin. Wait for an ambulance. Do not try to drive yourself.  Watch closely for changes in your health, and be sure to contact your doctor if you have any problems.  Where can you learn more?  Go to [employer] and enter F075 to learn more about A Healthy Heart: Care Instructions.  Current as of: April 15, 2022  Content Version: 14.1   2006-2024 [employer].   Care instructions adapted under license by [facility]. If you have questions about a medical condition or this instruction, always ask your healthcare professional. [employer] disclaims any warranty or liability for your use of this information.      Personalized Preventive Plan for SALIHA SALTS - 06/26/2023  Medicare offers a range of preventive health benefits. Some of the tests and screenings are paid in full while other may be subject to a deductible, co-insurance, and/or copay.    Some of these benefits include a comprehensive review of your medical history including lifestyle, illnesses that may run in your family, and various assessments and screenings as appropriate.    After reviewing your medical record and screening and assessments performed today your provider may have ordered immunizations, labs, imaging, and/or referrals for you.  A list of these orders (if applicable) as well as your Preventive Care list are included within your After Visit Summary for your review.    Other Preventive Recommendations:    A preventive eye exam performed by an eye specialist is recommended every 1-2  years to screen for glaucoma; cataracts, macular degeneration, and other eye disorders.  A preventive dental visit is recommended every 6 months.  Try to get at least 150 minutes of exercise per week or 10,000 steps per day on a pedometer .  Order or download the FREE Exercise & Physical Activity: Your Everyday Guide from [employer]. Call 4800196368 or search [employer] online.  You need 1200-1500 mg of calcium  and 1000-2000 IU of vitamin D per day. It is possible to meet your calcium  requirement with diet alone, but a vitamin D supplement is usually necessary to meet this goal.  When exposed to the sun, use a sunscreen that protects against both UVA and UVB radiation with an SPF of 30 or greater. Reapply every 2 to 3 hours or after  sweating, drying off with a towel, or swimming.  Always wear a seat belt when traveling in a car. Always wear a helmet when riding a bicycle or motorcycle.

## 2023-06-26 NOTE — Progress Notes (Signed)
 Chief Complaint   Patient presents with    Medicare AWV     Vitals:    06/26/23 1341   BP: (!) 152/83   Site: Left Upper Arm   Position: Sitting   Cuff Size: Medium Adult   Pulse: 88   Resp: 18   Temp: 97.5 F (36.4 C)   TempSrc: Temporal   SpO2: 97%   Weight: 40.8 kg (90 lb)   Height: 1.499 m (4' 11)     Have you been to the ER, urgent care clinic since your last visit?  Hospitalized since your last visit?    On June 20, 2023 for a UTI.     "Have you seen or consulted any other health care providers outside of [facility] since your last visit?"    NO            Click Here for Release of Records Request

## 2023-06-26 NOTE — Progress Notes (Signed)
 Medicare Annual Wellness Visit    Chelsea Schultz is here for Medicare AWV    Assessment & Plan   Medicare annual wellness visit, subsequent    Primary Hypertension: Controlled  -Continue losartan  100mg  daily, coreg  3.125mg  BID, norvasc  10mg  daily. Refilled meds today.  -Will plan to repeat BMP, CBC at next visit in 6 months. Last last 6 months ago largely wnl.    H/o CVA:    -Continue ASA 81mg  daily. Discussed risk vs. Benefits of continuing this medication. Pt and daughter would like to continue at this time    T2DM with neuropathy: A1C at goal in 10/2022 labs (6.9%)   -Continue metformin  500mg  BID. Refilled today.   -Continue lyrica  100mg  PRN. Reviewed PDMP. Refilled today.    -Request for diabetic shoe per daughter. Sent to pharmacy today   -Will plan to obtain next A1C in 6 months at next visit (approximately 1 year from last A1C)    Fecal/urinary incontinence   -Continue mirabegron . Refilled today.    Advanced Care Planning   -Daughter reports pt has no AD or living will    Recommendations for Preventive Services Due: see orders and patient instructions/AVS.  Recommended screening schedule for the next 5-10 years is provided to the patient in written form: see Patient Instructions/AVS.     Return in 6 months (on 12/24/2023) for annual labs/follow-up.     Subjective     HTN, h/o CVA  -Current meds: losartan  100mg  qd, coreg  3md BID, amlodipine  10mg  qd, ASA 81mg  qd  -Denies CP, SOB, HA, further symptoms or concerns  -Last labs in January at acute visit for UTI. CBC, CMP largely wnl.    T2DM with peripheral neuropathy  -Current meds: metformin  500mg  BID, Lyrica  100mg  qd PRN  -last labs in January. A1C 6.9%  -Pt's daughter requesting diabetic shoe    Hearing loss  -h/o of progressive hearing loss noted in prior notes. Has been referred to audiology at prior visit.    Fecal/urinary incontinence; recurrent UTIs  -seen in ED for UTI in January, treated with abx. Resolved.  -Taking mirabegron  daily.    H/o GLF,  debility  -Has 24/7 care at home with [facility]    Patient's complete Health Risk Assessment and screening values have been reviewed and are found in Flowsheets. The following problems were reviewed today and where indicated follow up appointments were made and/or referrals ordered.    No Positive Risk Factors identified today.    Discussed today Done Previously Not Needed    X-unsure if she has had; pt's daughter would like to verify with other caregivers   Pneumococcal vaccines   X-would like to get at pharmacy   Flu vaccine     x Hepatitis B vaccine (if at risk)   X-unsure if she has had; pt's daughter would like to verify with other caregivers   Shingles vaccine   X-unsure if she has had; pt's daughter would like to verify with other caregivers   TDAP vaccine     x Mammogram     x Pap smear     x Colorectal cancer screening     x Low-dose CT for lung cancer screening     x Bone density test     x Glaucoma screening     x Cholesterol test     x Diabetes screening test      X-A1C at goal Diabetes self-management class     X-A1C at goal Nutritionist referral for diabetes or  renal disease                         Inactivity:  On average, how many days per week do you engage in moderate to strenuous exercise (like a brisk walk)?: 0 days (!) Abnormal  On average, how many minutes do you engage in exercise at this level?: 0 min     Abnormal BMI (underweight):  Body mass index is 18.18 kg/m. (!) Abnormal    Dentist Screen:  Have you seen the dentist within the past year?: (!) No  Hearing Screen:  Do you or your family notice any trouble with your hearing that hasn't been managed with hearing aids?: (!) Yes  Vision Screen:  Do you have difficulty driving, watching TV, or doing any of your daily activities because of your eyesight?: (!) Yes  Have you had an eye exam within the past year?: Yes     ADL's:   Patient reports needing help with:  Select all that apply: (!) Grooming, Bathing, Toileting, Walking/Balance    Advanced  Directives:  Do you have a Living Will?: (!) No         Objective   Vitals:    06/26/23 1341   BP: (!) 152/83   Site: Left Upper Arm   Position: Sitting   Cuff Size: Medium Adult   Pulse: 88   Resp: 18   Temp: 97.5 F (36.4 C)   TempSrc: Temporal   SpO2: 97%   Weight: 40.8 kg (90 lb)   Height: 1.499 m (4' 11)      Body mass index is 18.18 kg/m.                 Allergies   Allergen Reactions    Nitrofurantoin Rash     Was placed on it 09/2019 and pt noted rash on her hand immediately after starting antibx. Will add it back to her allergy list.     Prior to Visit Medications    Medication Sig Taking? Authorizing Provider   amLODIPine  (NORVASC ) 10 MG tablet TAKE 1 TABLET(10 MG) BY MOUTH EVERY NIGHT Yes Trayvond Viets G, MD   carvedilol  (COREG ) 3.125 MG tablet Take 1 tablet by mouth 2 times daily Yes Zamar Odwyer G, MD   losartan  (COZAAR ) 100 MG tablet Take 1 tablet by mouth daily Yes Josslynn Mentzer G, MD   atorvastatin  (LIPITOR) 10 MG tablet Take 1 tablet by mouth daily Yes Addysyn Fern G, MD   metFORMIN  (GLUCOPHAGE ) 500 MG tablet Take 1 tablet by mouth 2 times daily (with meals) Yes Gonzella Lorane MATSU, MD   pregabalin  (LYRICA ) 100 MG capsule TAKE 1 CAPSULE BY MOUTH DAILY AS NEEDED FOR NEUROPATHY Yes Eleaner Dibartolo G, MD   mirabegron  (MYRBETRIQ ) 50 MG TB24 Take 50 mg by mouth daily Yes Skylyn Slezak G, MD   famotidine  (PEPCID ) 40 MG tablet Take 0.5 tablets by mouth in the morning and at bedtime ceived the following from [facility]: Outside name: famotidine  (PEPCID ) 40 mg tablet Yes Ludie Hudon G, MD   METAMUCIL FIBER PO Take by mouth Yes [provider]   trospium (SANCTURA) 20 MG tablet Take 1 tablet by mouth daily Yes [provider]   acetaminophen (TYLENOL) 650 MG extended release tablet Take 1 tablet by mouth daily as needed Yes Automatic Reconciliation, Ar   ascorbic acid (VITAMIN C) 1000 MG tablet Take 1 tablet by mouth daily Yes  Automatic Reconciliation, Ar  aspirin 81 MG EC tablet ceived the following from [facility]: Outside name: aspirin delayed-release 81 mg tablet Yes Automatic Reconciliation, Ar   docusate (COLACE, DULCOLAX) 100 MG CAPS Take 100 mg by mouth daily Yes Automatic Reconciliation, Ar   diclofenac sodium (VOLTAREN) 1 % GEL Apply 2 g topically 4 times daily  Automatic Reconciliation, Ar   Estradiol (VAGIFEM) 10 MCG TABS vaginal tablet Place 1 tablet vaginally  Automatic Reconciliation, Ar   polyethylene glycol (GLYCOLAX ) 17 GM/SCOOP powder Take 17 g by mouth daily as needed  Patient not taking: Reported on 09/05/2022  Automatic Reconciliation, Ar       CareTeam (Including outside providers/suppliers regularly involved in providing care):   Patient Care Team:  Leana Pac, MD as PCP - General       Reviewed and updated this visit:  Tobacco  Allergies  Meds  Problems  Med Hx  Surg Hx  Soc Hx  Fam Hx           RTC in 6 months for follow-up of chronic conditions, repeat labs.    Discussed with attending Dr. Henry Lorane Buck, MD  [facility] Resident

## 2023-06-26 NOTE — Progress Notes (Signed)
 I saw and evaluated the patient, performing the key elements of the service.  I discussed the findings, assessment and plan with the resident and agree with the resident's findings and plan as documented in the resident's note.

## 2023-08-05 ENCOUNTER — Encounter

## 2023-08-28 NOTE — Telephone Encounter (Signed)
MWV 06/26/23 with Cleotis Nipper, MD

## 2023-10-30 NOTE — Progress Notes (Signed)
Discharge criteria met. Patient discharged to [facility] via [facility]. VSS. Patient verbalized understanding of discharge instructions and provided with copy of AVS.     Vitals:    10/30/23 1302   BP: (!) 145/74   Pulse: 87   Resp:    Temp: 36.9 C (98.5 F)

## 2023-10-30 NOTE — Care Plan (Signed)
Problem: Patients that are at an increased risk for delirium.  Goal: Mobility:  The patient will be mobile.  10/30/2023 1321 by Bosie Helper, RN  Outcome: Met/ Completed  10/30/2023 1147 by Bosie Helper, RN  Outcome: Progressing  Goal: Orientation:  The patient will be oriented to person, time and place in the environment  10/30/2023 1321 by Bosie Helper, RN  Outcome: Met/ Completed  10/30/2023 1147 by Bosie Helper, RN  Outcome: Progressing  Goal: Sleep, rest, and comfort: The patient will be comfortable and rested.  10/30/2023 1321 by Bosie Helper, RN  Outcome: Met/ Completed  10/30/2023 1147 by Bosie Helper, RN  Outcome: Progressing  Goal: Hydration and nutrition: The patient will be hydrated and nourished.  10/30/2023 1321 by Bosie Helper, RN  Outcome: Met/ Completed  10/30/2023 1147 by Bosie Helper, RN  Outcome: Progressing  Goal: Collaboration and communication: The patient's status will be effectively communicated to the rest of the care team.  10/30/2023 1321 by Bosie Helper, RN  Outcome: Met/ Completed  10/30/2023 1147 by Bosie Helper, RN  Outcome: Progressing     Problem: Risk for Injury related to restraint use  Goal: Remains free from restraints  10/30/2023 1321 by Bosie Helper, RN  Outcome: Met/ Completed  10/30/2023 1147 by Bosie Helper, RN  Outcome: Progressing  Goal: Patient will remain free from injury while in restraints  10/30/2023 1321 by Bosie Helper, RN  Outcome: Met/ Completed  10/30/2023 1147 by Bosie Helper, RN  Outcome: Progressing     Problem: All patients at Med/High risk for pressure injury development  Goal: Will be able to maintain skin integrity.  10/30/2023 1321 by Bosie Helper, RN  Outcome: Met/ Completed  10/30/2023 1147 by Bosie Helper, RN  Outcome: Progressing     Problem: Patient specific interventions for pressure injury development  Goal: Skin Care  10/30/2023 1321 by Bosie Helper, RN  Outcome: Met/ Completed  10/30/2023 1147 by Bosie Helper, RN  Outcome:  Progressing  Goal: Nutrition  10/30/2023 1321 by Bosie Helper, RN  Outcome: Met/ Completed  10/30/2023 1147 by Bosie Helper, RN  Outcome: Progressing  Goal: Repositioning and Mobilization  10/30/2023 1321 by Bosie Helper, RN  Outcome: Met/ Completed  10/30/2023 1147 by Bosie Helper, RN  Outcome: Progressing  Goal: Other  10/30/2023 1321 by Bosie Helper, RN  Outcome: Met/ Completed  10/30/2023 1147 by Bosie Helper, RN  Outcome: Progressing     Problem: All patients positive for delirium  Goal: Patient will remain safe  10/30/2023 1321 by Bosie Helper, RN  Outcome: Met/ Completed  10/30/2023 1147 by Bosie Helper, RN  Outcome: Progressing     Problem: Disorientation  Goal: The patient will be oriented to person, time, or place in the environment  10/30/2023 1321 by Bosie Helper, RN  Outcome: Met/ Completed  10/30/2023 1147 by Bosie Helper, RN  Outcome: Progressing     Problem: Inappropriate Behavior  Goal: The patient will have behavior appropriate to place and/or for the person e.g. pulling at tubes or dressings, attempting to get out of bed when that is contraindicated and the like  10/30/2023 1321 by Bosie Helper, RN  Outcome: Met/ Completed  10/30/2023 1147 by Bosie Helper, RN  Outcome: Progressing     Problem: Inappropriate Communication  Goal: The patient will have communication appropriate to place and/or for the person e.g. incoherence, non-communicative, nonsensical, or unintelligible speech  10/30/2023 1321 by Bosie Helper, RN  Outcome: Met/ Completed  10/30/2023 1147 by Bosie Helper, RN  Outcome:  Progressing     Problem: Illusions/Hallucinations  Goal: The patient will no longer see or hear things that are not there, distortion of visual objects  10/30/2023 1321 by Bosie Helper, RN  Outcome: Met/ Completed  10/30/2023 1147 by Bosie Helper, RN  Outcome: Progressing     Problem: Psychomotor Delay  Goal: The patient will no longer have delayed responsiveness, few or spontaneous actions/word e.g.  when patient is prodded, reaction is deferred and/or the patient is unarousable  10/30/2023 1321 by Bosie Helper, RN  Outcome: Met/ Completed  10/30/2023 1147 by Bosie Helper, RN  Outcome: Progressing     Problem: All patients at High risk for falls  Goal: Patient will remain free of falls.  10/30/2023 1321 by Bosie Helper, RN  Outcome: Met/ Completed  10/30/2023 1147 by Bosie Helper, RN  Outcome: Progressing     Problem: Gait Alteration  Goal: Patient will mobilize safely without falling  10/30/2023 1321 by Bosie Helper, RN  Outcome: Met/ Completed  10/30/2023 1147 by Bosie Helper, RN  Outcome: Progressing     Problem: Cognitive/Mental or Behavior Alteration  Goal: Patient will mobilize safely without falling  10/30/2023 1321 by Bosie Helper, RN  Outcome: Met/ Completed  10/30/2023 1147 by Bosie Helper, RN  Outcome: Progressing     Problem: Medications  Goal: Patient will mobilize safely without falling  10/30/2023 1321 by Bosie Helper, RN  Outcome: Met/ Completed  10/30/2023 1147 by Bosie Helper, RN  Outcome: Progressing

## 2024-02-15 NOTE — Telephone Encounter (Signed)
-----   Message from [place] O sent at 02/15/2024 11:02 AM EDT -----  Regarding: ECC Message to Provider  ECC Message to Provider    Relationship to Patient: Covered Entity [facility]      Additional Information: Caller wanted the office to know that the patient was in the rehab and they need to get an order for HOME OTPT, she was seen in the [facility] and was receiving occupational therapy and physical therapy through the first week of April and was discharged because she was moving back to [place]  and the services was disrupted and they need to resume those services through the local home help agency and the name of that agency here in [place] is [facility]   --------------------------------------------------------------------------------------------------------------------------    Call Back Information: OK to leave message on voicemail  Preferred Call Back Number: Phone [phone]

## 2024-02-21 NOTE — Telephone Encounter (Signed)
 Tried to give daughter a call. Left voicemail for her to give us  a call back to schedule appointment.

## 2024-02-25 ENCOUNTER — Ambulatory Visit: Payer: MEDICARE

## 2024-02-25 ENCOUNTER — Telehealth: Admit: 2024-02-25 | Discharge: 2024-02-25 | Payer: MEDICARE

## 2024-02-25 DIAGNOSIS — Z9181 History of falling: Secondary | ICD-10-CM

## 2024-02-25 NOTE — Progress Notes (Addendum)
 Chelsea Schultz  89 y.o. female  Mar 09, 1926  [address]  [place] [address]  [id number]   [facility]:    Telemedicine Progress Note  Colleen Dawn, MD       Encounter Date and Time: Feb 25, 2024 at 4:01 PM    Arta Lark, was evaluated through a synchronous (real-time) audio-video encounter. The patient (or guardian if applicable) is aware that this is a billable service, which includes applicable co-pays. This Virtual Visit was conducted with patient's (and/or legal guardian's) consent. Patient identification was verified, and a caregiver was present when appropriate.   The patient was located at Home: [address]  [place] [address]  Provider was located at [facility] (Appt Dept): [address] [place],  [place] [address]  Confirm you are appropriately licensed, registered, or certified to deliver care in the state where the patient is located as indicated above. If you are not or unsure, please re-schedule the visit: Yes, I confirm.       No chief complaint on file.    History of Present Illness   Chelsea Schultz is a 89 y.o. female was evaluated by synchronous (real-time) audio-video technology from home, through a secure patient portal.    Daughter and patient requesting home OT/PT.   Had home PT/OT for three months in [place] . She was admitted to the hospital there while visiting another daughter and was discharged to SNF for 5 weeks, then received 3 weeks of PT/OT after that. She is now living with another daughter in [place]. She needs home PT/OT in [place] now.     Per her prior PT, she has "h/o stenosis, lam/fusion and thoracic washout/repair 2013. With age, UTI, osteoporosis, failure to thrive, she has lost strength and function."     They have been working with her for several months now to regain mobility and strength so that she can ambulate independently.     Review of Systems   Review of Systems   Musculoskeletal:   Positive for arthralgias and gait problem.     Vitals/Objective:     General: alert, cooperative, and no distress   Mental  status: mental status: alert, oriented to person, place, and time, normal mood, behavior, speech, dress, motor activity, and thought processes   Resp: normal effort and no respiratory distress   Neuro: not assessed   Skin: skin: no discoloration or lesions of concern on visible areas   Due to this being a TeleHealth evaluation, many elements of the physical examination are unable to be assessed.    Assessment and Plan:   1. At risk for falling  2. Frequent falls  3. Debility  4. Walker as ambulation aid  - External Referral To Home Health  - referred to [facility] PT/OT given current mobility status  - she has significant spinal stenosis s/p laminectomy/fusion which significantly restricts her mobility  - patient currently ambulating with a cane, cannot leave her home without assistance from daughter/son who are not always available    Assessment/Plan:see above    Time spent in direct conversation with the patient to include medical condition(s) discussed, assessment and treatment plan:    We discussed the expected course, resolution and complications of the diagnosis(es) in detail.  Medication risks, benefits, costs, interactions, and alternatives were discussed as indicated.  I advised her to contact the office if her condition worsens, changes or fails to improve as anticipated. She expressed understanding with the  diagnosis(es) and plan. Patient understands that this encounter was a temporary measure, and the importance of further follow up and examination was emphasized.  Patient verbalized understanding.         Electronically Signed: Colleen Dawn, MD     History   Patients past medical, surgical and family histories were reviewed and updated.      Past Medical History:   Diagnosis Date    Arthritis     CVA (cerebral infarction)     Diabetes (HCC) 01/09/2015    Hemoglobin A1C 6.4 (per records,  done at outside facility)    GERD (gastroesophageal reflux disease)     History of recurrent UTIs     on chronic antibiotic prophylaxis, followed by urology    HLD (hyperlipidemia) 01/09/2015    Total Chol 141, Tri 49, HDL 59, LDL 72    Hypertension     Neuropathy     Osteopetrosis     Spinal stenosis      Past Surgical History:   Procedure Laterality Date    ORTHOPEDIC SURGERY      Lumbar laminectomy    ORTHOPEDIC SURGERY      Lumbar fusion     Family History   Problem Relation Age of Onset    Stroke Father     Cancer Sister     Hypertension Father     Hypertension Mother     Stroke Mother     Diabetes Mother     Stroke Sister      Social History     Tobacco Use    Smoking status: Never    Smokeless tobacco: Never   Substance Use Topics    Alcohol use: No    Drug use: No     Patient Active Problem List   Diagnosis    Type 2 diabetes mellitus with diabetic neuropathy (HCC)    Type 2 diabetes with nephropathy (HCC)    Acute ischemic stroke (HCC)    Advance directive discussed with patient    At risk for falling    Urgency incontinence    Abnormal foot finding    Pulmonary fibrosis (HCC)    Recurrent UTI    HTN (hypertension)    Other constipation    Hearing difficulty of both ears          Current Medications/Allergies   Medications and Allergies reviewed:    Current Outpatient Medications   Medication Sig Dispense Refill    amLODIPine  (NORVASC ) 10 MG tablet TAKE 1 TABLET(10 MG) BY MOUTH EVERY NIGHT 90 tablet 3    carvedilol  (COREG ) 3.125 MG tablet Take 1 tablet by mouth 2 times daily 180 tablet 3    losartan  (COZAAR ) 100 MG tablet Take 1 tablet by mouth daily 90 tablet 3    atorvastatin  (LIPITOR) 10 MG tablet Take 1 tablet by mouth daily 90 tablet 3    metFORMIN  (GLUCOPHAGE ) 500 MG tablet Take 1 tablet by mouth 2 times daily (with meals) 180 tablet 3    pregabalin  (LYRICA ) 100 MG capsule TAKE 1 CAPSULE BY MOUTH DAILY AS NEEDED FOR NEUROPATHY 90 capsule 3    mirabegron  (MYRBETRIQ ) 50 MG TB24 Take 50 mg by mouth daily  90 tablet 3    famotidine  (PEPCID ) 40 MG tablet Take 0.5 tablets by mouth in the morning and at bedtime ceived the following from [facility]: Outside name: famotidine  (PEPCID ) 40 mg tablet 90 tablet 3    METAMUCIL FIBER PO Take by mouth  trospium (SANCTURA) 20 MG tablet Take 1 tablet by mouth daily      acetaminophen (TYLENOL) 650 MG extended release tablet Take 1 tablet by mouth daily as needed      ascorbic acid (VITAMIN C) 1000 MG tablet Take 1 tablet by mouth daily      aspirin 81 MG EC tablet ceived the following from [facility]: Outside name: aspirin delayed-release 81 mg tablet      diclofenac sodium (VOLTAREN) 1 % GEL Apply 2 g topically 4 times daily      docusate (COLACE, DULCOLAX) 100 MG CAPS Take 100 mg by mouth daily      Estradiol (VAGIFEM) 10 MCG TABS vaginal tablet Place 1 tablet vaginally      polyethylene glycol (GLYCOLAX) 17 GM/SCOOP powder Take 17 g by mouth daily as needed (Patient not taking: Reported on 09/05/2022)       No current facility-administered medications for this visit.     Allergies   Allergen Reactions    Nitrofurantoin Rash     Was placed on it 09/2019 and pt noted rash on her hand immediately after starting antibx. Will add it back to her allergy list.

## 2024-02-26 NOTE — Telephone Encounter (Signed)
 Hi Dr. Tobe Fort,    Amber faxed over the office note from the virtual appointment yesterday. I received a call from [facility] with [facility] that stated the office note did not state exactly why OT/PT was needed just that they were requesting it. Loyce Ruffini did say that any documentation from the previous doctor will do as well. I call Caryl Clas patient daughter who stated that she discuss all this with you yesterday as to why OT/PT was needed and that should all be in your note. She asked if you can do an addendum and included everything you all discussed in your note.    Please fax everything to [phone]

## 2024-02-26 NOTE — Telephone Encounter (Signed)
 Faxed per request, mychart message sent to let her know with documents attached.

## 2024-02-27 NOTE — Progress Notes (Signed)
 I discussed the findings, assessment and plan with the resident and agree with the resident's findings and plan as documented in the resident's note.

## 2024-03-06 ENCOUNTER — Encounter

## 2024-03-10 NOTE — Telephone Encounter (Signed)
 Medication Refill Request    Chelsea Schultz is requesting a refill of the following medication(s):   Requested Prescriptions     Pending Prescriptions Disp Refills    pregabalin  (LYRICA ) 100 MG capsule [Pharmacy Med Name: PREGABALIN  100MG  CAPSULES] 90 capsule      Sig: TAKE 1 CAPSULE BY MOUTH DAILY AS NEEDED FOR NERVE PAIN        Listed PCP is Jossie Nine, MD   Last provider to prescribe medication: 06/26/2023  Last Date of Medication Prescribed: 06/26/2023   Date of Last Office Visit at [facility]: 02/25/2024   FUTURE APPOINTMENT: No future appointments.    Please send refill to:    [facility] #[id number] - [place], [place] - [name] - P [phone] Chelsea Schultz [phone]  [name]  [place] [address]  Phone: [phone] Fax: [phone]    Please review request and approve or deny with recommendations.

## 2024-03-13 MED ORDER — PREGABALIN 100 MG PO CAPS
100 | ORAL_CAPSULE | ORAL | 0 refills | 30.00000 days | Status: DC
Start: 2024-03-13 — End: 2024-06-03

## 2024-03-13 NOTE — Telephone Encounter (Signed)
Reviewed PDMP.

## 2024-03-13 NOTE — Telephone Encounter (Signed)
 Encounter Note routed to provided fax number. Confirmation at 09:19 AM.

## 2024-03-21 NOTE — Telephone Encounter (Signed)
 Hi Dr. Delta Fila from [facility] called to inform you they evaluated patient yesterday and will be faxing over plan of care notes.    If you have any questions you can reach her at [phone]

## 2024-04-03 NOTE — Telephone Encounter (Signed)
 Candice from [facility] called to state that the note need to be signed by Dr. Adelene Homer, then returned. They said Dr. Adelene Homer need to sign and date.

## 2024-04-03 NOTE — Telephone Encounter (Addendum)
 [facility] billing dept rep Candace requesting a copy of the virtual visit note for 5/5. For Medicare compliance reasons, they need the note to be cosigned by a [employer] certified attending. This PSR has identified Dr. Adelene Homer listed as a cosigner on that visit, have faxed the note to [facility] at [phone]. A successful fax confirmation was received.

## 2024-05-15 NOTE — Telephone Encounter (Signed)
 [facility] therapist Noreen Kopidis requesting verbal order to continue home health physical therapy. He can be reached at [phone]

## 2024-05-30 NOTE — Progress Notes (Signed)
 Signed Order from 04/17/24 faxed to [facility] faxed to [phone].

## 2024-06-01 ENCOUNTER — Encounter

## 2024-06-02 NOTE — Telephone Encounter (Signed)
 Medication Refill Request    JAYCI ELLEFSON is requesting a refill of the following medication(s):   Requested Prescriptions     Pending Prescriptions Disp Refills    pregabalin  (LYRICA ) 100 MG capsule [Pharmacy Med Name: PREGABALIN  100MG  CAPSULES] 90 capsule      Sig: TAKE 1 CAPSULE BY MOUTH DAILY AS NEEDED FOR NERVE PAIN        Listed PCP is Gonzella Lorane MATSU, MD   Last provider to prescribe medication: Dr. Jesusa  Last Date of Medication Prescribed: 03/13/2024   Date of Last Office Visit at [facility]: 02/25/2024 VV with Dr. Sherel     FUTURE APPOINTMENT: No future appointments.    Please send refill to:    [facility] #[id number] - [place], [place] - [name] - P [phone] [phone]  [name]  [place] [address]  Phone: [phone] Fax: [phone]      Please review request and approve or deny with recommendations.

## 2024-06-03 MED ORDER — PREGABALIN 100 MG PO CAPS
100 | ORAL_CAPSULE | ORAL | 0 refills | 30.00000 days | Status: DC
Start: 2024-06-03 — End: 2024-09-16

## 2024-06-03 NOTE — Telephone Encounter (Signed)
 Has history of known DM with neuropathy, taking lyrica  100 mg qd prn since 08/2022. Reviewed PDMP, appropriate. Will send refill. Recommend appointment to discuss medication continuation vs downtitrating given risk factors: age, debility, osteoporosis, FTT.

## 2024-06-30 NOTE — Telephone Encounter (Signed)
 Medication Refill Request    Chelsea Schultz is requesting a refill of the following medication(s):   Requested Prescriptions     Pending Prescriptions Disp Refills    atorvastatin  (LIPITOR) 10 MG tablet [Pharmacy Med Name: ATORVASTATIN  10MG  TABLETS] 90 tablet 3     Sig: TAKE 1 TABLET BY MOUTH DAILY        Listed PCP is Ledwith, Alexandra G, MD   Last provider to prescribe medication: Gonzella Lorane MATSU, MD   Last Date of Medication Prescribed: 06/26/2023   Date of Last Office Visit at [facility]: 02/25/2024   FUTURE APPOINTMENT:   Future Appointments   Date Time Provider Department Center   07/14/2024  3:40 PM Gonzella Lorane MATSU, MD [facility] DEP       Please send refill to:    [facility] #[id number] - [place], [place] - [name] - P [phone] - F [phone]  [name]  [place] [address]  Phone: [phone] Fax: [phone]    Please review request and approve or deny with recommendations.

## 2024-07-01 MED ORDER — ATORVASTATIN CALCIUM 10 MG PO TABS
10 | ORAL_TABLET | Freq: Every day | ORAL | 0 refills | 90.00000 days | Status: DC
Start: 2024-07-01 — End: 2024-08-01

## 2024-07-13 ENCOUNTER — Encounter

## 2024-07-14 ENCOUNTER — Ambulatory Visit: Payer: MEDICARE

## 2024-07-14 NOTE — Telephone Encounter (Signed)
 Patient is currently open to home health services with [facility]. Chelsea Schultz, from [facility], requesting a verbal order to approve continuing home health under the [facility] banner, this after they merged with [facility] two weeks ago. Please call her at [phone].

## 2024-07-16 NOTE — Telephone Encounter (Signed)
 Medication Refill Request    Chelsea Schultz is requesting a refill of the following medication(s):   Requested Prescriptions     Pending Prescriptions Disp Refills    amLODIPine  (NORVASC ) 10 MG tablet [Pharmacy Med Name: AMLODIPINE  BESYLATE 10MG TABLETS] 90 tablet 3     Sig: TAKE 1 TABLET(10 MG) BY MOUTH EVERY NIGHT        Listed PCP is Ledwith, Alexandra G, MD   Last provider to prescribe medication: Ledwith, Alexandra G, MD  Last Date of Medication Prescribed: 06/26/2023   Date of Last Office Visit at [facility] 02/25/2024   FUTURE APPOINTMENT: No future appointments.    Please send refill to:    [facility] #[id number] [facility], [place] - [address] - P [phone] [phone]  [address]  [place] [address]  Phone: [phone] Fax: [phone]    Please review request and approve or deny with recommendations.

## 2024-07-17 MED ORDER — AMLODIPINE BESYLATE 10 MG PO TABS
10 | ORAL_TABLET | ORAL | 3 refills | 90.00000 days | Status: DC
Start: 2024-07-17 — End: 2024-11-03

## 2024-07-19 ENCOUNTER — Encounter

## 2024-07-23 NOTE — Telephone Encounter (Signed)
 Medication Refill Request    Chelsea Schultz is requesting a refill of the following medication(s):   Requested Prescriptions     Pending Prescriptions Disp Refills    carvedilol  (COREG ) 3.125 MG tablet [Pharmacy Med Name: CARVEDILOL  3.125MG  TABLETS] 180 tablet 3     Sig: TAKE 1 TABLET BY MOUTH TWICE DAILY        Listed PCP is Ledwith, Alexandra G, MD   Last provider to prescribe medication: Ledwith, Alexandra G, MD   Last Date of Medication Prescribed: 06/26/2023   Date of Last Office Visit at [facility]: 03/13/2024   FUTURE APPOINTMENT: No future appointments.    Please send refill to:    [facility] #[id number] - [place], [place] - [name] - P [phone] [address] [phone]  [name]  [place] [address]  Phone: [phone] Fax: [phone]    Please review request and approve or deny with recommendations.

## 2024-07-28 NOTE — Telephone Encounter (Signed)
"  Medication Refill Request    KNOX HOLDMAN is requesting a refill of the following medication(s):   Requested Prescriptions     Pending Prescriptions Disp Refills    atorvastatin  (LIPITOR ) 10 MG tablet [Pharmacy Med Name: ATORVASTATIN  10MG  TABLETS] 30 tablet 0     Sig: TAKE 1 TABLET BY MOUTH DAILY        Listed PCP is Gonzella Lorane MATSU, MD   Last provider to prescribe medication: Gonzella Lorane MATSU, MD   Last Date of Medication Prescribed: 07/01/2024   Date of Last Office Visit at [facility]: 03/13/2024   FUTURE APPOINTMENT:   Future Appointments   Date Time Provider Department Center   08/01/2024 11:00 AM Wonda Lacinda LABOR, MD [facility] DEP   08/01/2024  4:00 PM Guadelupe Mace POUR, MD [facility] DEP       Please send refill to:    [facility] #[id number] - [place], [place] - [name] - P [phone] - F [phone]  [name]  [place] [address]  Phone: [phone] Fax: [phone]    Please review request and approve or deny with recommendations.     "

## 2024-07-29 NOTE — Telephone Encounter (Signed)
"  Please call patient to schedule an appointment.  "

## 2024-07-30 NOTE — Telephone Encounter (Signed)
"  Sent MyChart message to patient to schedule themselves for their office visit needed for Home Health.   "

## 2024-08-01 ENCOUNTER — Ambulatory Visit: Payer: MEDICARE

## 2024-08-01 ENCOUNTER — Ambulatory Visit: Admit: 2024-08-01 | Discharge: 2024-08-01 | Payer: MEDICARE

## 2024-08-01 DIAGNOSIS — K5900 Constipation, unspecified: Principal | ICD-10-CM

## 2024-08-01 DIAGNOSIS — E1121 Type 2 diabetes mellitus with diabetic nephropathy: Secondary | ICD-10-CM

## 2024-08-01 MED ORDER — DIABETIC SHOE
0 refills | 30.00000 days | Status: DC
Start: 2024-08-01 — End: 2024-11-04

## 2024-08-01 MED ORDER — CARVEDILOL 3.125 MG PO TABS
3.125 | ORAL_TABLET | Freq: Two times a day (BID) | ORAL | 3 refills | 30.00000 days | Status: DC
Start: 2024-08-01 — End: 2024-11-03

## 2024-08-01 MED ORDER — LOSARTAN POTASSIUM 100 MG PO TABS
100 | ORAL_TABLET | Freq: Every day | ORAL | 3 refills | 90.00000 days | Status: DC
Start: 2024-08-01 — End: 2024-08-21

## 2024-08-01 MED ORDER — POLYMYXIN B-TRIMETHOPRIM 10000-0.1 UNIT/ML-% OP SOLN
10000-0.1 | Freq: Four times a day (QID) | OPHTHALMIC | 0 refills | 21.00000 days | Status: AC
Start: 2024-08-01 — End: 2024-08-11

## 2024-08-01 MED ORDER — DIABETIC SHOE
0 refills | 30.00000 days | Status: DC
Start: 2024-08-01 — End: 2024-08-01

## 2024-08-01 MED ORDER — ATORVASTATIN CALCIUM 10 MG PO TABS
10 | ORAL_TABLET | Freq: Every day | ORAL | 3 refills | 90.00000 days | Status: DC
Start: 2024-08-01 — End: 2024-11-03

## 2024-08-01 MED ORDER — METFORMIN HCL 500 MG PO TABS
500 | ORAL_TABLET | Freq: Two times a day (BID) | ORAL | 3 refills | 90.00000 days | Status: DC
Start: 2024-08-01 — End: 2024-09-02

## 2024-08-01 MED ORDER — POLYETHYLENE GLYCOL 3350 17 GM/SCOOP PO POWD
17 | Freq: Every day | ORAL | 3 refills | 30.00000 days | Status: DC | PRN
Start: 2024-08-01 — End: 2024-11-03

## 2024-08-01 NOTE — Progress Notes (Signed)
 "         [address]   [place], [place] [address]    Office [phone], Fax [phone]      Subjective   Chelsea Schultz is a 89 y.o. female who presents for   Chief Complaint   Patient presents with    Cold Symptoms    Constipation     #Constipation  - sennakot to keep regular, but can cause too much diarrhea  - Eats lot of high fiber in diet  - Drinking water ,     #Bed sore  - Has small tear near anus after cleaning for constipation.   - Mostly sits throughout the day  - Has hospital bed and caregiver that comes daily. Also daughter at home that helps with caregiving.     #Acute cold sx  - runny nose, coughing  - 1 week ago.   - denies SOB.     # Refills  - Needs refills, unsure exactly which medications.   - also family w diabetic shoes    # Hard of hearing  - looking for audiology referral.       Review of Systems   Per HPI. All other systems reviewed and are negative.    Health Maintenance:  Health Maintenance Due   Topic Date Due    DTaP/Tdap/Td vaccine (1 - Tdap) Never done    Shingles vaccine (1 of 2) Never done    Respiratory Syncytial Virus (RSV) Pregnant or age 80 yrs+ (1 - 1-dose 75+ series) Never done    Lipids  09/24/2019    Pneumococcal 50+ years Vaccine (2 of 2 - PCV) 11/16/2021    Flu vaccine (1) 05/23/2024    COVID-19 Vaccine (6 - 2025-26 season) 06/23/2024    Depression Screen  06/25/2024    Annual Wellness Visit (Medicare)  06/26/2024        Medical History  Past Medical History:   Diagnosis Date    Arthritis     CVA (cerebral infarction)     Diabetes (HCC) 01/09/2015    Hemoglobin A1C 6.4 (per records, done at outside facility)    GERD (gastroesophageal reflux disease)     History of recurrent UTIs     on chronic antibiotic prophylaxis, followed by urology    HLD (hyperlipidemia) 01/09/2015    Total Chol 141, Tri 49, HDL 59, LDL 72    Hypertension     Neuropathy     Osteopetrosis     Spinal stenosis        Medications  Current Outpatient Medications   Medication Sig    polyethylene  glycol (GLYCOLAX ) 17 GM/SCOOP powder Take 17 g by mouth daily as needed (constipation)    atorvastatin  (LIPITOR ) 10 MG tablet Take 1 tablet by mouth daily    carvedilol  (COREG ) 3.125 MG tablet Take 1 tablet by mouth 2 times daily    losartan  (COZAAR ) 100 MG tablet Take 1 tablet by mouth daily    metFORMIN  (GLUCOPHAGE ) 500 MG tablet Take 1 tablet by mouth 2 times daily (with meals)    Diabetic Shoe MISC by Does not apply route    trimethoprim -polymyxin b  (POLYTRIM ) 10000-0.1 UNIT/ML-% ophthalmic solution Place 1 drop into the right eye 4 times daily for 10 days    amLODIPine  (NORVASC ) 10 MG tablet TAKE 1 TABLET(10 MG) BY MOUTH EVERY NIGHT    pregabalin  (LYRICA ) 100 MG capsule TAKE 1 CAPSULE BY MOUTH DAILY AS NEEDED FOR NERVE PAIN    mirabegron  (MYRBETRIQ )  50 MG TB24 Take 50 mg by mouth daily    famotidine  (PEPCID ) 40 MG tablet Take 0.5 tablets by mouth in the morning and at bedtime ceived the following from [facility]: Outside name: famotidine  (PEPCID ) 40 mg tablet    METAMUCIL FIBER PO Take by mouth    trospium  (SANCTURA ) 20 MG tablet Take 1 tablet by mouth daily    acetaminophen  (TYLENOL ) 650 MG extended release tablet Take 1 tablet by mouth daily as needed    ascorbic acid (VITAMIN C) 1000 MG tablet Take 1 tablet by mouth daily    aspirin 81 MG EC tablet ceived the following from [facility]: Outside name: aspirin delayed-release 81 mg tablet    diclofenac sodium (VOLTAREN) 1 % GEL Apply 2 g topically 4 times daily    docusate (COLACE, DULCOLAX) 100 MG CAPS Take 100 mg by mouth daily    Estradiol (VAGIFEM) 10 MCG TABS vaginal tablet Place 1 tablet vaginally     No current facility-administered medications for this visit.       Immunizations   Immunization History   Administered Date(s) Administered    COVID-19, Inactive, PFIZER Bivalent, DO NOT Dilute, (age 12y+) 08/29/2021    COVID-19, Inactive, PFIZER GRAY top, DO NOT Dilute, (age 70 y+) 11/14/2019, 12/17/2019    PPD Test  08/11/2013       Allergies   Allergies   Allergen Reactions    Nitrofurantoin Rash     Was placed on it 09/2019 and pt noted rash on her hand immediately after starting antibx. Will add it back to her allergy list.       Objective   Vital Signs  There were no vitals taken for this visit.    Physical Examination  General: Alert. Chronically frail, in wheelchair. No distress. Not diaphoretic. No jaundice. No cyanosis. No pallor.  HEENT: Normocephalic, atraumatic. Neck supple, no cervical adenopathy. Right eye with some discharge present   Cardio: Normal rate, regular rhythm.  Pulmonary: Effort nal. No accessory muscle use. No wheezes, rales, or rhonchi. Reduced breath sounds in all fields due to poor effort.   Abdominal: Soft.  No tenderness. No distension.   Extremities: No edema of lower extremities. Pulses 2+.   MSK: Grossly normal joint and motor function. No joint swelling or tenderness.  Neurological: CN II-XII grossly intact. Normal speech, moves all extremities, normal gait.   Psych: Normal mood, behavior, speech  Skin: No rash or suspicious moles.     Assessment and Plan   Chelsea Schultz is a 89 y.o. female who presents for Cold Symptoms and Constipation      1. Constipation, unspecified constipation type  Issues with constipation, however current regimen sennakot causes diarrhea. Suggest switching to Miralax  for citrating therapy  - polyethylene glycol (GLYCOLAX ) 17 GM/SCOOP powder; Take 17 g by mouth daily as needed (constipation)  Dispense: 578 g; Refill: 3    2. Wound of sacral region, initial encounter  Seen on images on phone. Small skin tearing noted above anus associated with cleaning due to diarrhea as above. Recommended Desitin for wound, offloading area to promote healing and prevent worsening. Has hospital bed at home that they can adjust. If clinical signs of worsening, recommended family return to clinic, would need to add on wound care to home health.     2. Type 2 diabetes mellitus with  diabetic nephropathy, without long-term current use of insulin  (HCC)  Chronic, refill. Provided Rx for diabetic shoes.   -  metFORMIN  (GLUCOPHAGE ) 500 MG tablet; Take 1 tablet by mouth 2 times daily (with meals)  Dispense: 180 tablet; Refill: 3  - Diabetic Shoe MISC; by Does not apply route  Dispense: 1 each; Refill: 0    3. Debility  - Diabetic Shoe MISC; by Does not apply route  Dispense: 1 each; Refill: 0    4. Primary hypertension  Chronic, refill   - carvedilol  (COREG ) 3.125 MG tablet; Take 1 tablet by mouth 2 times daily  Dispense: 180 tablet; Refill: 3  - losartan  (COZAAR ) 100 MG tablet; Take 1 tablet by mouth daily  Dispense: 90 tablet; Refill: 3    5. Mixed hyperlipidemia  Chronic, refill   - atorvastatin  (LIPITOR ) 10 MG tablet; Take 1 tablet by mouth daily  Dispense: 90 tablet; Refill: 3    6. Hard of hearing  Noticed trouble with hearing, family asking for audiology referral.   - [facility] - Rollene Ned, AUD, [facility]    7. Bacterial conjunctivitis of right eye  Discharge and mild swelling noted on lower eyelid. Differential is blepharitis vs conjunctivitis. Provided info in AVS on recs for hygiene, frequent eyelid soaks. If symptoms worsen, sent abx drops to start for possible contributory bacterial infxn.   - trimethoprim -polymyxin b  (POLYTRIM ) 10000-0.1 UNIT/ML-% ophthalmic solution; Place 1 drop into the right eye 4 times daily for 10 days  Dispense: 2 mL; Refill: 0          Due for physical, MAW visit with PCP.   Return in about 4 weeks (around 08/29/2024) for Medicare wellness.    Patient is counseled to return to the office if symptoms do not improve as expected.  Urgent consultation with the nearest Emergency Department is strongly recommended if condition worsens.       Pt was discussed with Dr. Henry (attending physician).    I have reviewed pertinent labs results and other data. I have discussed the diagnosis with the patient and the intended plan as seen in the above orders. I have discussed  medication side effects and warnings with the patient as well.    Note is dictated utilizing voice recognition software. Unfortunately this leads to occasional typographical errors. I apologize in advance if the situation occurs. If questions occur please do not hesitate to call our office.    Mace MARLA Backers, MD  Resident, [facility]  08/03/24  "

## 2024-08-01 NOTE — Patient Instructions (Addendum)
"-   Miralax  as needed, start with one scoop a day  - Desitin for wound on butt  "

## 2024-08-06 NOTE — Progress Notes (Signed)
"  I discussed the findings, assessment and plan with the resident and agree with the resident's findings and plan as documented in the resident's note.  "

## 2024-08-08 NOTE — Telephone Encounter (Signed)
"  Lyndell with [facility] called to follow up on order for diabetes supplies. She stated that it has been faxed here several times over the past month with no response. I provided her with another fax number and was able to obtain the document.    Dr. Guadelupe, you saw pt on 08/01/24. If you are willing to sign pt's order, I have placed it in Karen's folder in black box at nurse station 1. I have also scanned the unsigned order in EPIC.    Please advise.  "

## 2024-08-18 NOTE — Telephone Encounter (Signed)
"  Patient daughter stop by the office wanting to ask her mother PCP Dr. Gonzella some questions concerning her mother . The Number to reach Chelsea Schultz at [phone]    "

## 2024-08-19 ENCOUNTER — Ambulatory Visit: Admit: 2024-08-19 | Discharge: 2024-08-19 | Payer: MEDICARE

## 2024-08-19 LAB — AMB POC URINALYSIS DIP STICK AUTO W/O MICRO
Bilirubin, Urine, POC: NEGATIVE
Blood, Urine, POC: NEGATIVE
Ketones, Urine, POC: NEGATIVE
Leukocyte Esterase, Urine, POC: NEGATIVE
Nitrite, Urine, POC: NEGATIVE
Protein, Urine, POC: NEGATIVE
Specific Gravity, Urine, POC: 1.02 (ref 1.001–1.035)
Urobilinogen, POC: 0.2
pH, Urine, POC: 5.5 (ref 4.6–8.0)

## 2024-08-19 NOTE — Telephone Encounter (Signed)
"  Patient's daughter called to speak with a nurse regarding patient possibly having an UTI and she has mobility. She would like recommendations or to drop off urine specimen.  "

## 2024-08-19 NOTE — Telephone Encounter (Signed)
"  Current Symptoms:   Patients daughter, [name], reports patient is experiencing urinary frequency, urgency, and urine has a foul smell since 08/16/2024. She denies that patient has a fever, chills, abdominal pain or AMS. PO intake is normal but patient has slight decrease in appetite that started today.     Triage:      Yes No Comment(s)   Mentality Changes []  [x]     SOB []  [x]     Dyspnea []  [x]     Chest Pain []  [x]     Palpitations []  [x]     Tachycardia []  [x]     Headache []  [x]     Visual Changes []  [x]     Lightheadedness []  [x]     Dizziness []  [x]     Slurred Speech []  [x]     Nausea []  [x]     Vomiting []  [x]         Fever/Chills: Denies  Pain: No      What has been tried:   N/a    Better/Worse:   NA    Recommended disposition: Schedule appointment for evaluation      No future appointments.    Recommendations:   Pt accepted office visit for today at 630 pm.   Notify office of any changes or concerns prior to appointment  Advised If symptoms worsen, go to urgent care or nearest emergency room for evaluation      Patient agreed and voiced understanding to advice provided with opportunity for questions/concerns. No further questions or concerns at this time.     3:04 PM - Call ended/disconnected.    Alfonso Kerns, LPN   "

## 2024-08-19 NOTE — Progress Notes (Signed)
 "  [facility]         08/19/2024   Chief Complaint   Patient presents with    Urinary Frequency    Urinary Burning     Also itching.    Foot Pain     Bilateral times 2-3 weeks.       HPI:  Chelsea Schultz is a 89 y.o. female who presents to clinic today for dysuria.    Dysuria  Onset: 08/16/24, might have been before   Symptoms: urinary frequency, urgency & urine has foul smell  Denies any fever, chills, abd pain or AMS  Is staying hydrated but slight decreased appetite today   Patient's daughter reports mild confusion (ex forgot left & right) - reports patient does have sundowning & mild dementia  - patient lives with caregiver   No vaginal discharge    Feet pain   Onset: 3 weeks ago   Location: bilateral first MCP joint   Duration: intermittent   Characteristic: when pressure on location, ex standing   Aggravating factors: standing       HTN:   Took morning medication  BP was 149/77 this AM   -takes coreg  3.125mg  BID, losartan  100mg  qD, amlodipine  10mg  qhs  - denies any HA, CP, or any other symptoms      Patients past medical, surgical and family histories were reviewed.    Allergies and Medications reviewed and updated.    Review of Systems    Pertinent per HPI    Objective:  Vitals:    08/19/24 1846 08/19/24 1924   BP: (!) 182/93 (!) 170/80   BP Site:  Left Upper Arm   Patient Position:  Sitting   BP Cuff Size:  Small Adult   Pulse: 84 81   Resp: 18    Temp: 97.3 F (36.3 C)    TempSrc: Temporal    SpO2: 96%      There is no height or weight on file to calculate BMI.    Physical Exam  General: Patient alert and oriented and in NAD  HEENT: PER/EOMI, no conjunctival pallor or scleral icterus.    Heart: Regular rate and rhythm, No murmurs, rubs or gallops.  2+ peripheral pulses  Lungs: Clear to auscultation bilaterally, no wheezing, rales or rhonchi  Abd: +BS, non-tender, non-distended  Ext: No edema  Skin: No rashes or lesions noted on exposed  skin,  Psych: Appropriate mood and affect  MSK: bilateral feet notable for mild erythema on first MCP joint, no warm, no pain on palpation or movement.     Recent Results (from the past 24 hours)   AMB POC URINALYSIS DIP STICK AUTO W/O MICRO    Collection Time: 08/19/24  7:09 PM   Result Value Ref Range    Color, Urine, POC Yellow     Clarity, Urine, POC Clear     Glucose, Urine, POC Trace     Bilirubin, Urine, POC Negative     Ketones, Urine, POC Negative     Specific Gravity, Urine, POC 1.020 1.001 - 1.035    Blood, Urine, POC Negative Negative    pH, Urine, POC 5.5 4.6 - 8.0    Protein, Urine, POC Negative     Urobilinogen, POC 0.2 mg/dL     Nitrite, Urine, POC Negative     Leukocyte Esterase, Urine, POC Negative        Assessment and Plan:  1. Dysuria: acute, worsening.  Noted on 08/16/24 that urine had foul odor. Patient's daughter reports no fevers, nausea, vomiting. On PE: No suprapubic tenderness, no CVA tenderness. POC UA unremarkable. Symptoms likely 2/2 OA  -     AMB POC URINALYSIS DIP STICK AUTO W/O MICRO  - follow up with urogynecologist to discuss OA treatment   2. Bilateral bunions: chronic, controlled.   -     [facility] - Nathaniel Primes, DPM, Podiatry, [place]  3. History of stroke: occurred in 2013 & 2016    No follow-up provider specified.    I have discussed the aforementioned diagnoses and plan with the patient in detail. I have provided information in person and/or in AVS. All questions answered prior to discharge.     I discussed and seen this patient with Dr. Henry (Attending Physician)   Signed By:  Verneta Homes, MD     Family Medicine Resident           "

## 2024-08-19 NOTE — Progress Notes (Signed)
"  Roomed by name and DOB.    Chief Complaint   Patient presents with    Urinary Frequency    Urinary Burning     Also itching.    Foot Pain     Bilateral times 2-3 weeks.        Vitals:    08/19/24 1846   BP: (!) 182/93   Pulse: 84   Resp: 18   Temp: 97.3 F (36.3 C)   TempSrc: Temporal   SpO2: 96%          Have you been to the ER, urgent care clinic since your last visit?  Hospitalized since your last visit?    NO    Have you seen or consulted any other health care providers outside of [facility] since your last visit?    NO            Click Here for Release of Records Request     "

## 2024-08-20 ENCOUNTER — Encounter

## 2024-08-20 ENCOUNTER — Ambulatory Visit: Payer: MEDICARE

## 2024-08-20 NOTE — Telephone Encounter (Signed)
"  Medication Refill Request    Chelsea Schultz is requesting a refill of the following medication(s):   Requested Prescriptions     Pending Prescriptions Disp Refills    famotidine  (PEPCID ) 40 MG tablet [Pharmacy Med Name: FAMOTIDINE  40MG  TABLETS] 90 tablet 3     Sig: TAKE 1/2 TABLET BY MOUTH EVERY MORNING AND 1/2 TABLET AT BEDTIME    losartan  (COZAAR ) 100 MG tablet [Pharmacy Med Name: LOSARTAN  100MG  TABLETS] 90 tablet 3     Sig: TAKE 1 TABLET BY MOUTH DAILY        Listed PCP is Ledwith, Alexandra G, MD   Last provider to prescribe medication: Ledwith, Alexandra G, MD, Uhlar, Adrianna K, MD   Last Date of Medication Prescribed: 06/26/2023, 08/01/2024   Date of Last Office Visit at [facility]: 08/20/2024   FUTURE APPOINTMENT: No future appointments.    Please send refill to:    [id number] - [place], [place] - [name] - P [phone] [address] [phone]  [name]  [place] [address]  Phone: [phone] Fax: [phone]    Please review request and approve or deny with recommendations.     "

## 2024-08-20 NOTE — Telephone Encounter (Signed)
"  Patient had an appointment last night with Dr. Varys in regards to urine frequency. I will send this to Dr. Gonzella to follow up with patients DTR per request.  "

## 2024-08-21 MED ORDER — LOSARTAN POTASSIUM 100 MG PO TABS
100 | ORAL_TABLET | Freq: Every day | ORAL | 3 refills | 90.00000 days | Status: DC
Start: 2024-08-21 — End: 2024-11-03

## 2024-08-21 MED ORDER — FAMOTIDINE 40 MG PO TABS
40 | ORAL_TABLET | ORAL | 3 refills | 30.00000 days | Status: DC
Start: 2024-08-21 — End: 2024-11-03

## 2024-08-22 NOTE — Telephone Encounter (Signed)
"  DTR stated patient seems to be doing fine she has no concerns at this time and will reach out if there is anything else we can do for her  "

## 2024-08-27 NOTE — Progress Notes (Signed)
"  I saw and evaluated the patient, performing the key elements of the service.  I discussed the findings, assessment and plan with the resident and agree with the resident's findings and plan as documented in the resident's note.    "

## 2024-08-31 ENCOUNTER — Encounter

## 2024-09-02 MED ORDER — METFORMIN HCL 500 MG PO TABS
500 | ORAL_TABLET | ORAL | 0 refills | 90.00000 days | Status: DC
Start: 2024-09-02 — End: 2024-11-03

## 2024-09-02 NOTE — Telephone Encounter (Signed)
"  Please call patient to schedule an appointment.  "

## 2024-09-02 NOTE — Telephone Encounter (Signed)
"  Medication Refill Request    Chelsea Schultz is requesting a refill of the following medication(s):   Requested Prescriptions     Pending Prescriptions Disp Refills    metFORMIN  (GLUCOPHAGE ) 500 MG tablet [Pharmacy Med Name: METFORMIN  500MG  TABLETS] 180 tablet 3     Sig: TAKE 1 TABLET BY MOUTH TWICE DAILY WITH A MEAL        Listed PCP is Ledwith, Alexandra G, MD   Last provider to prescribe medication: Uhlar, Adrianna K, MD   Last Date of Medication Prescribed: 08/01/2024   Date of Last Office Visit at [facility]: 08/19/2024   FUTURE APPOINTMENT: No future appointments.    Please send refill to:    [id number] - [place], [place] - [name] - P [phone] [address] [phone]  [name]  [place] [address]  Phone: [phone] Fax: [phone]    Please review request and approve or deny with recommendations.     "

## 2024-09-03 NOTE — Telephone Encounter (Signed)
 Attempted to reach patient to schedule appointment. Left voicemail for patient to call back to schedule.

## 2024-09-07 ENCOUNTER — Encounter

## 2024-09-08 NOTE — Telephone Encounter (Signed)
"  Medication Refill Request    Chelsea Schultz is requesting a refill of the following medication(s):   Requested Prescriptions     Pending Prescriptions Disp Refills    pregabalin  (LYRICA ) 100 MG capsule [Pharmacy Med Name: PREGABALIN  100MG  CAPSULES] 90 capsule      Sig: TAKE 1 CAPSULE BY MOUTH DAILY AS NEEDED FOR NERVE PAIN        Listed PCP is Gonzella Lorane MATSU, MD   Last provider to prescribe medication: Trivedi  Last Date of Medication Prescribed: 06/13/24   Date of Last Office Visit at [facility]: 08/19/24   FUTURE APPOINTMENT: No future appointments.    Please send refill to:    [id number] - [place], [place] - [name] - P [phone] [address] [phone]  [name]  [place] [address]  Phone: [phone] Fax: [phone]    Please review request and approve or deny with recommendations.    "

## 2024-09-09 ENCOUNTER — Encounter

## 2024-09-10 NOTE — Telephone Encounter (Signed)
"  Medication Refill Request    GLYNN FREAS is requesting a refill of the following medication(s):   Requested Prescriptions     Pending Prescriptions Disp Refills    pregabalin  (LYRICA ) 100 MG capsule 90 capsule 0     Sig: TAKE 1 CAPSULE BY MOUTH DAILY AS NEEDED FOR NERVE PAIN        Listed PCP is Gonzella Lorane MATSU, MD   Last provider to prescribe medication:Teivedi  Last Date of Medication Prescribed: 06/13/24   Date of Last Office Visit at [facility]:08/19/24    FUTURE APPOINTMENT: No future appointments.    Please send refill to:    [id number] - [place], [place] - [name] - P [phone] [address] [phone]  [name]  [place] [address]  Phone: [phone] Fax: [phone]      Please review request and approve or deny with recommendations.    "

## 2024-09-16 ENCOUNTER — Encounter

## 2024-09-16 MED ORDER — PREGABALIN 100 MG PO CAPS
100 | ORAL_CAPSULE | ORAL | 0 refills | 30.00000 days | Status: DC
Start: 2024-09-16 — End: 2024-11-03

## 2024-09-16 NOTE — Telephone Encounter (Signed)
"  Pt was seen an OV with you on 10/10. She is out of Pregabalin . The daughter is requesting a refill for pt. She stated that she would be willing to bring her in after the 1st of the year, but pt needs enough medication to last until then.     Please advise if you would be willing to fill this Rx.  "

## 2024-09-16 NOTE — Progress Notes (Signed)
"  Reviewed PDMP. Pregabalin  90 day refill sent. Recommended pt FU for physical with PCP as patient has only been seen for acute care, due for labs/physical. Message sent to [facility].  - AU   "

## 2024-09-17 NOTE — Telephone Encounter (Signed)
"  Called pt and her child answered. Confirmed identity with NAME and DOB. Answered all questions. Expressed understanding. Did say that they were going to make a follow up appt with a physical in the next year.  "

## 2024-10-10 ENCOUNTER — Emergency Department: Admit: 2024-10-11 | Payer: MEDICARE

## 2024-10-10 DIAGNOSIS — A4181 Sepsis due to Enterococcus: Secondary | ICD-10-CM

## 2024-10-10 DIAGNOSIS — J189 Pneumonia, unspecified organism: Principal | ICD-10-CM

## 2024-10-10 NOTE — ED Provider Notes (Signed)
 "      [facility] EMERGENCY DEPARTMENT  EMERGENCY DEPARTMENT ENCOUNTER      Pt Name: Chelsea Schultz  MRN: [id number]  Birthdate [birth year]  Date of evaluation: 10/10/2024  Provider: Juliene Duet, MD    CHIEF COMPLAINT       Chief Complaint   Patient presents with    Fatigue       HISTORY OF PRESENT ILLNESS    HPI    89 year old female history of recurrent UTIs, diabetes, hypertension presenting for generalized weakness.  Daughter at bedside states for the past week patient has had a wet sounding cough.  There was a caretaker at home that maybe had some similar illness last week.  Today daughter found the patient laying in the bed and seeming very tired, not able to assist with transfers as much as usual.    On my evaluation, patient is awake and alert, she denies complaints.    Nursing notes reviewed.    REVIEW OF SYSTEMS     Review of Systems  Unless otherwise stated, a complete review of systems was asked of the patient. Pertinent positives are noted in the HPI section.    PAST MEDICAL HISTORY     Past Medical History:   Diagnosis Date    Arthritis     CVA (cerebral infarction)     Diabetes (HCC) 01/09/2015    Hemoglobin A1C 6.4 (per records, done at outside facility)    GERD (gastroesophageal reflux disease)     History of recurrent UTIs     on chronic antibiotic prophylaxis, followed by urology    HLD (hyperlipidemia) 01/09/2015    Total Chol 141, Tri 49, HDL 59, LDL 72    Hypertension     Neuropathy     Osteopetrosis     Spinal stenosis        SURGICAL HISTORY       Past Surgical History:   Procedure Laterality Date    ORTHOPEDIC SURGERY      Lumbar laminectomy    ORTHOPEDIC SURGERY      Lumbar fusion       CURRENT MEDICATIONS       Previous Medications    ACETAMINOPHEN  (TYLENOL ) 650 MG EXTENDED RELEASE TABLET    Take 1 tablet by mouth daily as needed    AMLODIPINE  (NORVASC ) 10 MG TABLET    TAKE 1 TABLET(10 MG) BY MOUTH EVERY NIGHT    ASCORBIC ACID (VITAMIN C) 1000 MG TABLET    Take 1 tablet by  mouth daily    ASPIRIN 81 MG EC TABLET    ceived the following from [facility]: Outside name: aspirin delayed-release 81 mg tablet    ATORVASTATIN  (LIPITOR ) 10 MG TABLET    Take 1 tablet by mouth daily    CARVEDILOL  (COREG ) 3.125 MG TABLET    Take 1 tablet by mouth 2 times daily    CHOLECALCIFEROL (VITAMIN D-3 PO)    Take by mouth    DIABETIC SHOE MISC    by Does not apply route    DICLOFENAC SODIUM (VOLTAREN) 1 % GEL    Apply 2 g topically 4 times daily    DOCUSATE (COLACE, DULCOLAX) 100 MG CAPS    Take 100 mg by mouth daily    ESTRADIOL (VAGIFEM) 10 MCG TABS VAGINAL TABLET    Place 1 tablet vaginally    FAMOTIDINE  (PEPCID ) 40 MG TABLET    TAKE 1/2 TABLET BY MOUTH EVERY MORNING AND  1/2 TABLET AT BEDTIME    LOSARTAN  (COZAAR ) 100 MG TABLET    TAKE 1 TABLET BY MOUTH DAILY    METAMUCIL FIBER PO    Take by mouth    METFORMIN  (GLUCOPHAGE ) 500 MG TABLET    TAKE 1 TABLET BY MOUTH TWICE DAILY WITH A MEAL    MIRABEGRON  (MYRBETRIQ ) 50 MG TB24    Take 50 mg by mouth daily    POLYETHYLENE GLYCOL (GLYCOLAX ) 17 GM/SCOOP POWDER    Take 17 g by mouth daily as needed (constipation)    PREGABALIN  (LYRICA ) 100 MG CAPSULE    TAKE 1 CAPSULE BY MOUTH DAILY AS NEEDED FOR NERVE PAIN    TROSPIUM  (SANCTURA ) 20 MG TABLET    Take 1 tablet by mouth daily    VAGINAL LUBRICANT (VAGISIL LUBRICANT) GEL    Place vaginally    VITAMIN B-12 (CYANOCOBALAMIN) 1000 MCG TABLET           ALLERGIES     Nitrofurantoin    FAMILY HISTORY       Family History   Problem Relation Age of Onset    Stroke Father     Cancer Sister     Hypertension Father     Hypertension Mother     Stroke Mother     Diabetes Mother     Stroke Sister         SOCIAL HISTORY       Social History     Socioeconomic History    Marital status: Widowed   Tobacco Use    Smoking status: Never    Smokeless tobacco: Never   Substance and Sexual Activity    Alcohol use: No    Drug use: No     Social Drivers of Psychologist, Prison [employer] Probation Services Strain: Low Risk  (10/25/2023)    Received  from [facility]    Overall Financial Resource Strain (CARDIA)     Difficulty of Paying Living Expenses: Not hard at all   Food Insecurity: No Food Insecurity (10/25/2023)    Received from [facility]    Hunger Vital Sign     Within the past 12 months, you worried that your food would run out before you got the money to buy more.: Never true     Within the past 12 months, the food you bought just didn't last and you didn't have money to get more.: Never true   Transportation Needs: No Transportation Needs (10/25/2023)    Received from [facility] - Transportation     In the past 12 months, has lack of transportation kept you from medical appointments or from getting medications?: No     Lack of Transportation (Non-Medical): No   Physical Activity: Inactive (06/26/2023)    Exercise Vital Sign     Days of Exercise per Week: 0 days     Minutes of Exercise per Session: 0 min   Stress: Stress Concern Present (03/19/2020)    Received from [facility] - Occupational Stress Questionnaire     Feeling of Stress : To some extent   Social Connections: Socially Isolated (03/19/2020)    Received from [facility]    Social Connection and Isolation Panel     In a typical week, how many times do you talk on the phone with family, friends, or neighbors?: More than three times a week  How often do you get together with friends or relatives?: Once a week     How often do you attend church or religious services?: Never     Do you belong to any clubs or organizations such as church groups, unions, fraternal or athletic groups, or school groups?: No     How often do you attend meetings of the clubs or organizations you belong to?: Never     Are you married, widowed, divorced, separated, never married, or living with a partner?: Widowed   Housing Stability: High Risk (10/26/2023)    Received from [facility]    Housing Stability Vital Sign     In the last 12 months, was there a time when you were not able to pay the mortgage or rent on time?: No     In the past 12 months, how many times have you moved where you were living?: 5     At any time in the past 12 months, were you homeless or living in a shelter (including now)?: No       PHYSICAL EXAM       ED Triage Vitals   BP Girls Systolic BP Percentile Girls Diastolic BP Percentile Boys Systolic BP Percentile Boys Diastolic BP Percentile Temp Temp src Pulse   -- -- -- -- -- -- -- --      Resp SpO2 Height Weight       -- -- -- --         Physical Exam  Constitutional:       Appearance: Normal appearance.   HENT:      Head: Normocephalic and atraumatic.      Nose: Nose normal.      Mouth/Throat:      Mouth: Mucous membranes are moist.   Eyes:      General: No scleral icterus.     Extraocular Movements: Extraocular movements intact.   Cardiovascular:      Rate and Rhythm: Normal rate.      Pulses: Normal pulses.   Pulmonary:      Effort: Pulmonary effort is normal.      Breath sounds: Normal breath sounds.   Abdominal:      General: Abdomen is flat.   Musculoskeletal:         General: No deformity or signs of injury.      Cervical back: Normal range of motion and neck supple.   Skin:     General: Skin is warm.   Neurological:      General: No focal deficit present.      Mental Status: She is alert.   Psychiatric:         Mood and Affect: Mood normal.         DIAGNOSTIC RESULTS   EKG: If obtained, EKG interpretation provided in the ED course    RADIOLOGY:   XR CHEST PORTABLE   Final Result   1. Diffuse bilateral interstitial opacities may represent some combination of   pulmonary interstitial edema and chronic interstitial lung disease.         Electronically signed by Wendelyn JINNY Hoops           LABS:  Labs Reviewed   CBC WITH AUTO DIFFERENTIAL - Abnormal; Notable for the following components:       Result Value    WBC 14.8 (*)     Lymphocytes % 10.2  (*)     Neutrophils Absolute 10.97 (*)  Monocytes Absolute 1.89 (*)     Immature Granulocytes Absolute 0.06 (*)     All other components within normal limits   COMPREHENSIVE METABOLIC PANEL - Abnormal; Notable for the following components:    Sodium 135 (*)     Glucose 179 (*)     BUN/Creatinine Ratio 24 (*)     Calcium  10.9 (*)     ALT 8 (*)     Albumin 2.8 (*)     Globulin 4.8 (*)     Albumin/Globulin Ratio 0.6 (*)     All other components within normal limits   BRAIN NATRIURETIC PEPTIDE - Abnormal; Notable for the following components:    NT Pro-BNP 628 (*)     All other components within normal limits   COVID-19 & INFLUENZA COMBO   URINALYSIS WITH MICROSCOPIC       All other labs were within normal range or not returned as of this dictation.    EMERGENCY DEPARTMENT COURSE and DIFFERENTIAL DIAGNOSIS/MDM:   Vitals:    Vitals:    10/10/24 2143 10/10/24 2144 10/10/24 2207   BP: (!) 158/72     Pulse: (!) 108 (!) 105    Resp: 20 18    Temp: 100 F (37.8 C) 100 F (37.8 C)    TempSrc: Oral Oral    SpO2: (!) 88% 91%    Weight:   44.9 kg (98 lb 15.8 oz)       Medical Decision Making  Cough, generalized weakness  -Oral temp 100.0, pulse ox borderline low at 88%-91%  -ddx broad including covid flu pneumonia, uti, viral syndrome, dehydration.  -will get labs cxr, UA, give antipyretics    Amount and/or Complexity of Data Reviewed  Labs: ordered. Decision-making details documented in ED Course.  Radiology: ordered. Decision-making details documented in ED Course.    Risk  OTC drugs.  Decision regarding hospitalization.        REASSESSMENT     ED Course as of 10/11/24 0009   Fri Oct 10, 2024   2211 WBC(!): 14.8 [AS]   2229 Rapid Influenza A By PCR: Not detected [AS]   2243 XR CHEST PORTABLE  IMPRESSION:  1. Diffuse bilateral interstitial opacities may represent some combination of  pulmonary interstitial edema and chronic interstitial lung disease.   [AS]      ED Course User Index  [AS] Phylis Cornet, MD        CONSULTS:  None    PROCEDURES:  Procedures    FINAL IMPRESSION      1. Community acquired pneumonia, unspecified laterality    2. Hypoxemia        Perfect Serve Consult for Admission  12:09 AM    ED Room Number: ER08/08  Patient Name and age:  CANDIES PALM 89 y.o.  female  Working Diagnosis:   1. Community acquired pneumonia, unspecified laterality    2. Hypoxemia        COVID-19 Suspicion: No  Sepsis present:  No  Reassessment needed: No  Code Status:  Full Code  Readmission: No  Isolation Requirements: no  Recommended Level of Care: med/surg  Department: [facility] - [phone]  Consulting Provider: n/a    Other:  32F here for 1 week of cough and congestion, today has been seeming tired and weak, needing increased assistance to stand.  Hypoxic to 88%, placed on 2LNC. WBC 15, CXR read as  edema although to me more likely pneumonia. Starting abx.  DISPOSITION/PLAN   DISPOSITION Decision To Admit 10/11/2024 12:08:09 AM    (Please note that portions of this note were completed with a voice recognition program.  Efforts were made to edit the dictations but occasionally words are mis-transcribed.)    Juliene Duet, MD (electronically signed)  Emergency Attending Physician      Duet Juliene, MD  10/11/24 0010    "

## 2024-10-11 ENCOUNTER — Inpatient Hospital Stay: Admit: 2024-10-11 | Payer: MEDICARE

## 2024-10-11 ENCOUNTER — Inpatient Hospital Stay: Admission: EM | Admit: 2024-10-11 | Discharge: 2024-11-23 | Disposition: E | Payer: MEDICARE

## 2024-10-11 LAB — CBC WITH AUTO DIFFERENTIAL
Basophils %: 0.5 % (ref 0.0–1.0)
Basophils Absolute: 0.08 K/UL (ref 0.00–0.10)
Eosinophils %: 1.8 % (ref 0.0–7.0)
Eosinophils Absolute: 0.26 K/UL (ref 0.00–0.40)
Hematocrit: 38.9 % (ref 35.0–47.0)
Hemoglobin: 12.5 g/dL (ref 11.5–16.0)
Immature Granulocytes %: 0.4 % (ref 0.0–0.5)
Immature Granulocytes Absolute: 0.06 K/UL — ABNORMAL HIGH (ref 0.00–0.04)
Lymphocytes %: 10.2 % — ABNORMAL LOW (ref 12.0–49.0)
Lymphocytes Absolute: 1.5 K/UL (ref 0.80–3.50)
MCH: 27 pg (ref 26.0–34.0)
MCHC: 32.1 g/dL (ref 30.0–36.5)
MCV: 84 FL (ref 80.0–99.0)
MPV: 10.1 FL (ref 8.9–12.9)
Monocytes %: 12.8 % (ref 5.0–13.0)
Monocytes Absolute: 1.89 K/UL — ABNORMAL HIGH (ref 0.00–1.00)
Neutrophils %: 74.3 % (ref 32.0–75.0)
Neutrophils Absolute: 10.97 K/UL — ABNORMAL HIGH (ref 1.80–8.00)
Nucleated RBCs: 0 /100{WBCs}
Platelets: 366 K/uL (ref 150–400)
RBC: 4.63 M/uL (ref 3.80–5.20)
RDW: 12.9 % (ref 11.5–14.5)
WBC: 14.8 K/uL — ABNORMAL HIGH (ref 3.6–11.0)
nRBC: 0 K/uL (ref 0.00–0.01)

## 2024-10-11 LAB — BASIC METABOLIC PANEL
Anion Gap: 9 mmol/L (ref 2–14)
BUN/Creatinine Ratio: 25 — ABNORMAL HIGH (ref 12–20)
BUN: 12 mg/dL (ref 8–23)
CO2: 20 mmol/L (ref 20–29)
Calcium: 9.5 mg/dL (ref 8.2–9.6)
Chloride: 106 mmol/L (ref 98–107)
Creatinine: 0.5 mg/dL — ABNORMAL LOW (ref 0.60–1.00)
Est, Glom Filt Rate: 86 ml/min/1.73m2 (ref >59)
Glucose: 140 mg/dL — ABNORMAL HIGH (ref 65–100)
Potassium: 3.6 mmol/L (ref 3.5–5.1)
Sodium: 135 mmol/L — ABNORMAL LOW (ref 136–145)

## 2024-10-11 LAB — CBC
Hematocrit: 30.3 % — ABNORMAL LOW (ref 35.0–47.0)
Hemoglobin: 9.5 g/dL — ABNORMAL LOW (ref 11.5–16.0)
MCH: 27 pg (ref 26.0–34.0)
MCHC: 31.4 g/dL (ref 30.0–36.5)
MCV: 86.1 FL (ref 80.0–99.0)
MPV: 9.7 FL (ref 8.9–12.9)
Nucleated RBCs: 0 /100{WBCs}
Platelets: 304 K/uL (ref 150–400)
RBC: 3.52 M/uL — ABNORMAL LOW (ref 3.80–5.20)
RDW: 12.9 % (ref 11.5–14.5)
WBC: 11.2 K/uL — ABNORMAL HIGH (ref 3.6–11.0)
nRBC: 0 K/uL (ref 0.00–0.01)

## 2024-10-11 LAB — COMPREHENSIVE METABOLIC PANEL
ALT: 8 U/L — ABNORMAL LOW (ref 10–35)
AST: 22 U/L (ref 10–35)
Albumin/Globulin Ratio: 0.6 — ABNORMAL LOW (ref 1.1–2.2)
Albumin: 2.8 g/dL — ABNORMAL LOW (ref 3.5–5.2)
Alk Phosphatase: 89 U/L (ref 35–104)
Anion Gap: 11 mmol/L (ref 2–14)
BUN/Creatinine Ratio: 24 — ABNORMAL HIGH (ref 12–20)
BUN: 16 mg/dL (ref 8–23)
CO2: 23 mmol/L (ref 20–29)
Calcium: 10.9 mg/dL — ABNORMAL HIGH (ref 8.2–9.6)
Chloride: 101 mmol/L (ref 98–107)
Creatinine: 0.66 mg/dL (ref 0.60–1.00)
Est, Glom Filt Rate: 80 ml/min/1.73m2 (ref >59)
Globulin: 4.8 g/dL — ABNORMAL HIGH (ref 2.0–4.0)
Glucose: 179 mg/dL — ABNORMAL HIGH (ref 65–100)
Potassium: 3.9 mmol/L (ref 3.5–5.1)
Sodium: 135 mmol/L — ABNORMAL LOW (ref 136–145)
Total Bilirubin: 0.4 mg/dL (ref 0.0–1.2)
Total Protein: 7.6 g/dL (ref 6.4–8.3)

## 2024-10-11 LAB — PROCALCITONIN
Procalcitonin: 0.09 ng/mL
Procalcitonin: 0.09 ng/mL

## 2024-10-11 LAB — URINALYSIS WITH REFLEX TO CULTURE
Bilirubin, Urine: NEGATIVE
Blood, Urine: NEGATIVE
Glucose, Ur: 250 mg/dL — AB
Ketones, Urine: NEGATIVE mg/dL
Nitrite, Urine: NEGATIVE
Protein, UA: 30 mg/dL — AB
Specific Gravity, UA: 1.021 (ref 1.003–1.030)
Urobilinogen, Urine: 1 EU/dL (ref 0.2–1.0)
pH, Urine: 5.5 (ref 5.0–8.0)

## 2024-10-11 LAB — RESPIRATORY PANEL, MOLECULAR, WITH COVID-19

## 2024-10-11 LAB — TSH + FREE T4 PANEL
T4 Free: 1.5 ng/dL (ref 0.9–1.6)
TSH, 3rd Generation: 1.11 u[IU]/mL (ref 0.270–4.200)

## 2024-10-11 LAB — PTH, INTACT
Calcium: 10.7 mg/dL — ABNORMAL HIGH (ref 8.2–9.6)
Pth Intact: 55.5 pg/mL (ref 15.0–65.0)

## 2024-10-11 LAB — COVID-19 & INFLUENZA COMBO
Rapid Influenza A By PCR: NOT DETECTED
Rapid Influenza B By PCR: NOT DETECTED
SARS-CoV-2, PCR: NOT DETECTED

## 2024-10-11 LAB — C-REACTIVE PROTEIN: CRP: 9 mg/dL — ABNORMAL HIGH (ref 0.0–0.5)

## 2024-10-11 LAB — BRAIN NATRIURETIC PEPTIDE: NT Pro-BNP: 628 pg/mL — ABNORMAL HIGH (ref 0–450)

## 2024-10-11 LAB — VITAMIN D 25 HYDROXY: Vit D, 25-Hydroxy: 36.3 ng/mL (ref 30.0–100.0)

## 2024-10-11 LAB — EXTRA TUBES HOLD

## 2024-10-11 LAB — MAGNESIUM
Magnesium: 1.2 mg/dL — ABNORMAL LOW (ref 1.7–2.3)
Magnesium: 2.2 mg/dL (ref 1.7–2.3)

## 2024-10-11 LAB — SEDIMENTATION RATE: Sed Rate, Automated: 43 mm/h — ABNORMAL HIGH (ref 0–30)

## 2024-10-11 MED ORDER — GLUCOSE 4 G PO CHEW
4 | ORAL | Status: DC | PRN
Start: 2024-10-11 — End: 2024-10-13

## 2024-10-11 MED ORDER — STERILE WATER FOR INJECTION (MIXTURES ONLY)
2 | INTRAMUSCULAR | Status: AC
Start: 2024-10-11 — End: 2024-10-11
  Administered 2024-10-11: 06:00:00 2000 mg via INTRAVENOUS

## 2024-10-11 MED ORDER — ALBUTEROL SULFATE (2.5 MG/3ML) 0.083% IN NEBU
RESPIRATORY_TRACT | Status: DC | PRN
Start: 2024-10-11 — End: 2024-11-03

## 2024-10-11 MED ORDER — ATORVASTATIN CALCIUM 10 MG PO TABS
10 | Freq: Every day | ORAL | Status: DC
Start: 2024-10-11 — End: 2024-10-19
  Administered 2024-10-11 – 2024-10-18 (×7): 10 mg via ORAL

## 2024-10-11 MED ORDER — ACETAMINOPHEN 500 MG PO TABS
500 | ORAL | Status: AC
Start: 2024-10-11 — End: 2024-10-10
  Administered 2024-10-11: 03:00:00 500 mg via ORAL

## 2024-10-11 MED ORDER — ACETAMINOPHEN 325 MG PO TABS
325 | Freq: Four times a day (QID) | ORAL | Status: DC | PRN
Start: 2024-10-11 — End: 2024-10-19
  Administered 2024-10-12: 08:00:00 650 mg via ORAL

## 2024-10-11 MED ORDER — SODIUM CHLORIDE 0.9 % IV SOLN
0.9 | INTRAVENOUS | Status: DC | PRN
Start: 2024-10-11 — End: 2024-11-03
  Administered 2024-10-18: 22:00:00 via INTRAVENOUS

## 2024-10-11 MED ORDER — IPRATROPIUM-ALBUTEROL 0.5-2.5 (3) MG/3ML IN SOLN
0.5-2.5 | Freq: Four times a day (QID) | RESPIRATORY_TRACT | Status: DC
Start: 2024-10-11 — End: 2024-10-12
  Administered 2024-10-11 – 2024-10-12 (×2): 1 via RESPIRATORY_TRACT

## 2024-10-11 MED ORDER — IPRATROPIUM-ALBUTEROL 0.5-2.5 (3) MG/3ML IN SOLN
0.5-2.5 | RESPIRATORY_TRACT | Status: DC | PRN
Start: 2024-10-11 — End: 2024-10-11

## 2024-10-11 MED ORDER — DEXTROSE 10 % IV BOLUS
INTRAVENOUS | Status: DC | PRN
Start: 2024-10-11 — End: 2024-10-13

## 2024-10-11 MED ORDER — CEFTRIAXONE SODIUM 1 G IJ SOLR
1 | INTRAMUSCULAR | Status: DC
Start: 2024-10-11 — End: 2024-10-13
  Administered 2024-10-12 – 2024-10-13 (×2): 1000 mg via INTRAVENOUS

## 2024-10-11 MED ORDER — MELATONIN 3 MG PO TABS
3 | Freq: Every evening | ORAL | Status: DC | PRN
Start: 2024-10-11 — End: 2024-10-21
  Administered 2024-10-15 – 2024-10-21 (×5): 6 mg via ORAL

## 2024-10-11 MED ORDER — LOSARTAN POTASSIUM 50 MG PO TABS
50 | Freq: Every day | ORAL | Status: DC
Start: 2024-10-11 — End: 2024-10-19
  Administered 2024-10-11 – 2024-10-18 (×6): 100 mg via ORAL

## 2024-10-11 MED ORDER — SODIUM CHLORIDE 0.9 % IV SOLN
0.9 | INTRAVENOUS | Status: AC
Start: 2024-10-11 — End: 2024-10-13
  Administered 2024-10-11 – 2024-10-12 (×2): via INTRAVENOUS

## 2024-10-11 MED ORDER — TROSPIUM CHLORIDE 20 MG PO TABS
20 | Freq: Two times a day (BID) | ORAL | Status: DC
Start: 2024-10-11 — End: 2024-10-16
  Administered 2024-10-11 – 2024-10-16 (×8): 20 mg via ORAL

## 2024-10-11 MED ORDER — POTASSIUM CHLORIDE 10 MEQ/100ML IV SOLN
10 | INTRAVENOUS | Status: DC | PRN
Start: 2024-10-11 — End: 2024-10-16

## 2024-10-11 MED ORDER — IPRATROPIUM-ALBUTEROL 0.5-2.5 (3) MG/3ML IN SOLN
0.5-2.5 | RESPIRATORY_TRACT | Status: DC
Start: 2024-10-11 — End: 2024-10-11

## 2024-10-11 MED ORDER — DEXTROSE 10 % IV SOLN
10 | INTRAVENOUS | Status: DC | PRN
Start: 2024-10-11 — End: 2024-10-13

## 2024-10-11 MED ORDER — GLUCAGON EMERGENCY 1 MG IJ SOLR
1 | INTRAMUSCULAR | Status: DC | PRN
Start: 2024-10-11 — End: 2024-10-13

## 2024-10-11 MED ORDER — BENZONATATE 100 MG PO CAPS
100 | Freq: Three times a day (TID) | ORAL | Status: DC | PRN
Start: 2024-10-11 — End: 2024-10-24

## 2024-10-11 MED ORDER — ACETAMINOPHEN 650 MG RE SUPP
650 | Freq: Four times a day (QID) | RECTAL | Status: DC | PRN
Start: 2024-10-11 — End: 2024-10-22
  Administered 2024-10-21 – 2024-10-22 (×3): 650 mg via RECTAL

## 2024-10-11 MED ORDER — POTASSIUM CHLORIDE ER 10 MEQ PO TBCR
10 | ORAL | Status: DC | PRN
Start: 2024-10-11 — End: 2024-10-16

## 2024-10-11 MED ORDER — PROCHLORPERAZINE EDISYLATE 10 MG/2ML IJ SOLN
10 | Freq: Four times a day (QID) | INTRAMUSCULAR | Status: DC | PRN
Start: 2024-10-11 — End: 2024-11-03

## 2024-10-11 MED ORDER — MAGNESIUM SULFATE 4000 MG/100 ML IVPB PREMIX
4 | Freq: Once | INTRAVENOUS | Status: DC
Start: 2024-10-11 — End: 2024-10-11

## 2024-10-11 MED ORDER — NORMAL SALINE FLUSH 0.9 % IV SOLN
0.9 | INTRAVENOUS | Status: DC | PRN
Start: 2024-10-11 — End: 2024-11-03
  Administered 2024-10-27: 02:00:00 10 mL via INTRAVENOUS

## 2024-10-11 MED ORDER — CARVEDILOL 3.125 MG PO TABS
3.125 | Freq: Two times a day (BID) | ORAL | Status: DC
Start: 2024-10-11 — End: 2024-10-19
  Administered 2024-10-11 – 2024-10-18 (×13): 3.125 mg via ORAL

## 2024-10-11 MED ORDER — MAGNESIUM OXIDE -MG SUPPLEMENT 400 (240 MG) MG PO TABS
400 | Freq: Every day | ORAL | Status: AC
Start: 2024-10-11 — End: 2024-10-13
  Administered 2024-10-11 – 2024-10-13 (×3): 400 mg via ORAL

## 2024-10-11 MED ORDER — BISACODYL 5 MG PO TBEC
5 | Freq: Every day | ORAL | Status: DC | PRN
Start: 2024-10-11 — End: 2024-10-24
  Administered 2024-10-11: 17:00:00 5 mg via ORAL

## 2024-10-11 MED ORDER — MAGNESIUM SULFATE 2000 MG/50 ML IVPB PREMIX
2 | INTRAVENOUS | Status: AC
Start: 2024-10-11 — End: 2024-10-11
  Administered 2024-10-11 (×2): 2000 mg via INTRAVENOUS

## 2024-10-11 MED ORDER — NORMAL SALINE FLUSH 0.9 % IV SOLN
0.9 | Freq: Two times a day (BID) | INTRAVENOUS | Status: DC
Start: 2024-10-11 — End: 2024-10-21
  Administered 2024-10-11 – 2024-10-21 (×19): 10 mL via INTRAVENOUS

## 2024-10-11 MED ORDER — MAGNESIUM SULFATE 2000 MG/50 ML IVPB PREMIX
2 | INTRAVENOUS | Status: DC | PRN
Start: 2024-10-11 — End: 2024-10-16

## 2024-10-11 MED ORDER — POTASSIUM BICARB-CITRIC ACID 20 MEQ PO TBEF
20 | ORAL | Status: DC | PRN
Start: 2024-10-11 — End: 2024-10-16

## 2024-10-11 MED ORDER — SODIUM CHLORIDE 0.9 % IV SOLN
0.9 | INTRAVENOUS | Status: AC
Start: 2024-10-11 — End: 2024-10-14
  Administered 2024-10-12 – 2024-10-15 (×4): 500 mg via INTRAVENOUS

## 2024-10-11 MED ORDER — SODIUM CHLORIDE 0.9 % IV SOLN
0.9 | INTRAVENOUS | Status: AC
Start: 2024-10-11 — End: 2024-10-11
  Administered 2024-10-11: 06:00:00 500 mg via INTRAVENOUS

## 2024-10-11 MED ORDER — ENOXAPARIN SODIUM 30 MG/0.3ML IJ SOSY
30 | Freq: Every day | INTRAMUSCULAR | Status: DC
Start: 2024-10-11 — End: 2024-10-22
  Administered 2024-10-11 – 2024-10-22 (×11): 30 mg via SUBCUTANEOUS

## 2024-10-11 MED FILL — ATORVASTATIN CALCIUM 20 MG PO TABS: 20 mg | ORAL | Qty: 1 | Fill #0

## 2024-10-11 MED FILL — DEXTROSE 10 % IV SOLN: 10 % | INTRAVENOUS | Qty: 500 | Fill #0

## 2024-10-11 MED FILL — CEFTRIAXONE SODIUM 2 G IJ SOLR: 2 g | INTRAMUSCULAR | Qty: 2000 | Fill #0

## 2024-10-11 MED FILL — LOSARTAN POTASSIUM 50 MG PO TABS: 50 mg | ORAL | Qty: 2 | Fill #0

## 2024-10-11 MED FILL — CARVEDILOL 3.125 MG PO TABS: 3.125 mg | ORAL | Qty: 1 | Fill #0

## 2024-10-11 MED FILL — ENOXAPARIN SODIUM 30 MG/0.3ML IJ SOSY: 30 MG/0.3ML | INTRAMUSCULAR | Qty: 0.3 | Fill #0

## 2024-10-11 MED FILL — SODIUM CHLORIDE 0.9 % IV SOLN: 0.9 % | INTRAVENOUS | Qty: 1000 | Fill #0

## 2024-10-11 MED FILL — BD POSIFLUSH 0.9 % IV SOLN: 0.9 % | INTRAVENOUS | Qty: 40 | Fill #0

## 2024-10-11 MED FILL — AZITHROMYCIN 500 MG IV SOLR: 500 mg | INTRAVENOUS | Qty: 500 | Fill #0

## 2024-10-11 MED FILL — MAGNESIUM SULFATE 2 GM/50ML IV SOLN: 2 GM/50ML | INTRAVENOUS | Qty: 50 | Fill #0

## 2024-10-11 MED FILL — BISACODYL EC 5 MG PO TBEC: 5 mg | ORAL | Qty: 1 | Fill #0

## 2024-10-11 MED FILL — TROSPIUM CHLORIDE 20 MG PO TABS: 20 mg | ORAL | Qty: 1 | Fill #0

## 2024-10-11 MED FILL — IPRATROPIUM-ALBUTEROL 0.5-2.5 (3) MG/3ML IN SOLN: 0.5-2.5 (3) MG/3ML | RESPIRATORY_TRACT | Qty: 3 | Fill #0

## 2024-10-11 MED FILL — ACETAMINOPHEN EXTRA STRENGTH 500 MG PO TABS: 500 mg | ORAL | Qty: 2 | Fill #0

## 2024-10-11 MED FILL — MAGNESIUM OXIDE -MG SUPPLEMENT 400 (240 MG) MG PO TABS: 400 (240 Mg) MG | ORAL | Qty: 1 | Fill #0

## 2024-10-11 NOTE — Progress Notes (Addendum)
 "    Hospitalist Progress Note      NAME:  Chelsea Schultz   DOB:  1926/03/08  MRN:  [id number]    Date/Time: 10/11/2024  7:14 AM           Assessment / Plan:     Chelsea Schultz is a 89 y.o. female with DM2, hypertension, overactive bladder, and recurrent UTIs.  She was admitted to this hospital on 10/10/2024 for evaluation/management of acute hypoxic respiratory failure thought to be secondary to pulmonary fibrosis or community-acquired pneumonia.    #Acute hypoxemic respiratory failure  #Pulmonary fibrosis  #Community-acquired pneumonia: Reason for admission.  CT chest from admission shows diffuse interstitial prominence and ground glass opacities suspicious for pulmonary fibrosis and pneumonitis versus pneumonia.  BNP normal for age, procalcitonin not elevated.  Not clinically fluid overloaded on exam.  Lower suspicion for PE.  COVID and flu negative.  -Continue empiric ceftriaxone  and azithromycin  for now  -Continue nebulized bronchodilators.  Will make scheduled instead of as needed  -Hold off on steroids for now  -Consider pulmonary consult if no significant improvement  -Wean oxygen as able  -RVP    #Sepsis: Met criteria on admission with elevated WBC (improved) and elevated heart rate (improved) with suspected pneumonia.  Procalcitonin not elevated.  -Continue ceftriaxone  and azithromycin  as above  -UA ordered, needs to be collected  -RVP  -CBC tomorrow    #Normocytic anemia: Not present on admission.  Suspect she has chronic anemia that was masked by hemoconcentration on admission.  -CBC tomorrow; check hemoglobin sooner if bleeding is suspected or observed    #Hyponatremia: POA, stable.  -BMP tomorrow    #Hypercalcemia: POA, improved.  Presumably from dehydration.  -BMP tomorrow    #Hypomagnesemia  -Start scheduled magnesium  x 3 days    #DM2 with hyperglycemia  -Hold home metformin     #Overactive bladder  -Continue home meds    #Hypertension  -Continue home carvedilol  and losartan   -Hold home  amlodipine     #Hyperlipidemia  -Continue home atorvastatin     I have personally reviewed the radiographs, laboratory data in Epic and decisions and statements above are based partially on this personal interpretation.      ----------------------------------------------  ADDENDUM: UA c/w UTI.  Urine culture may be sterile given administration of antibiotics before collection, but can continue empiric CTX for now.      ----------------------------------------------                   Care Plan discussed with: Patient and Nursing Staff.  Attempted to call two daughters listed in chart, no answer    Discussed:  Care Plan    Prophylaxis:  Lovenox     Disposition:  TBD           ___________________________________________________    Attending Physician: Marsa VEAR Mura, MD        Subjective:     Chief Complaint: Cough, weakness    Chelsea Schultz was lying in bed, awake, when I went to see her.  She groans occasionally and shook her head yes and no, but did not seem to answer my questions meaningfully.  She attempted to speak but I could not totally understand what she was trying to say as her voice was very quiet.  No family available at bedside.    ROS: Unable to complete 10 system ROS as patient was not reliably answering all my questions       Objective:       Vitals:  Last 24hrs VS reviewed since prior progress note. Most recent are:    Vitals:    10/11/24 0400   BP: (!) 142/50   Pulse: 78   Resp: 16   Temp: 98.7 F (37.1 C)   SpO2: 93%     SpO2 Readings from Last 6 Encounters:   10/11/24 93%   08/19/24 96%   06/26/23 97%   09/05/22 96%   06/19/22 96%   05/11/22 94%        No intake or output data in the 24 hours ending 10/11/24 9285       Exam:     Physical Exam:    Gen:  Awake, somewhat slow to respond  HEENT:  Pink conjunctivae, hearing intact to voice, no evident epistaxis  Neck:  Supple, no apparent masses or swelling on inspection  Resp: Normal respiratory effort.  No rales or wheezes, though lung sounds are  quiet overall  Card:  Normal S1, S2 without thrills or bruits, no significant peripheral pitting edema  Abd:  Soft, non-tender, not significantly distended  Musc:  No cyanosis or clubbing  Skin:  No rashes or ulcers on examined skin, normal color  Neuro: Not following simple commands.  No tremors.  Not answering orientation questions  Psych:  calm/cooperative, not apparently responding to internal stimuli      Medications Reviewed: (see below)    Lab Data Reviewed: (see below)    ______________________________________________________________________    Medications:     Current Facility-Administered Medications   Medication Dose Route Frequency    prochlorperazine  (COMPAZINE ) injection 10 mg  10 mg IntraVENous Q6H PRN    glucose chewable tablet 16 g  4 tablet Oral PRN    dextrose  bolus 10% 125 mL  125 mL IntraVENous PRN    Or    dextrose  bolus 10% 250 mL  250 mL IntraVENous PRN    glucagon  injection 1 mg  1 mg SubCUTAneous PRN    dextrose  10 % infusion   IntraVENous Continuous PRN    sodium chloride  flush 0.9 % injection 5-40 mL  5-40 mL IntraVENous 2 times per day    sodium chloride  flush 0.9 % injection 5-40 mL  5-40 mL IntraVENous PRN    0.9 % sodium chloride  infusion   IntraVENous PRN    potassium chloride  (KLOR-CON ) extended release tablet 40 mEq  40 mEq Oral PRN    Or    potassium bicarb-citric acid  (EFFER-K) effervescent tablet 40 mEq  40 mEq Oral PRN    Or    potassium chloride  10 mEq/100 mL IVPB (Peripheral Line)  10 mEq IntraVENous PRN    magnesium  sulfate 2000 mg in 50 mL IVPB premix  2,000 mg IntraVENous PRN    enoxaparin  Sodium (LOVENOX ) injection 30 mg  30 mg SubCUTAneous Daily    acetaminophen  (TYLENOL ) tablet 650 mg  650 mg Oral Q6H PRN    Or    acetaminophen  (TYLENOL ) suppository 650 mg  650 mg Rectal Q6H PRN    0.9 % sodium chloride  infusion   IntraVENous Continuous    melatonin tablet 6 mg  6 mg Oral Nightly PRN    bisacodyl  (DULCOLAX) EC tablet 5 mg  5 mg Oral Daily PRN     Current Outpatient  Medications   Medication Sig    pregabalin  (LYRICA ) 100 MG capsule TAKE 1 CAPSULE BY MOUTH DAILY AS NEEDED FOR NERVE PAIN    metFORMIN  (GLUCOPHAGE ) 500 MG tablet TAKE 1 TABLET BY MOUTH TWICE DAILY WITH A MEAL  famotidine  (PEPCID ) 40 MG tablet TAKE 1/2 TABLET BY MOUTH EVERY MORNING AND 1/2 TABLET AT BEDTIME    losartan  (COZAAR ) 100 MG tablet TAKE 1 TABLET BY MOUTH DAILY    Vaginal Lubricant (VAGISIL LUBRICANT) GEL Place vaginally    vitamin B-12 (CYANOCOBALAMIN) 1000 MCG tablet     Cholecalciferol (VITAMIN D-3 PO) Take by mouth    polyethylene glycol (GLYCOLAX ) 17 GM/SCOOP powder Take 17 g by mouth daily as needed (constipation)    atorvastatin  (LIPITOR ) 10 MG tablet Take 1 tablet by mouth daily    carvedilol  (COREG ) 3.125 MG tablet Take 1 tablet by mouth 2 times daily    Diabetic Shoe MISC by Does not apply route (Patient not taking: Reported on 08/19/2024)    amLODIPine  (NORVASC ) 10 MG tablet TAKE 1 TABLET(10 MG) BY MOUTH EVERY NIGHT    mirabegron  (MYRBETRIQ ) 50 MG TB24 Take 50 mg by mouth daily    METAMUCIL FIBER PO Take by mouth    trospium  (SANCTURA ) 20 MG tablet Take 1 tablet by mouth daily    acetaminophen  (TYLENOL ) 650 MG extended release tablet Take 1 tablet by mouth daily as needed    ascorbic acid (VITAMIN C) 1000 MG tablet Take 1 tablet by mouth daily    aspirin 81 MG EC tablet ceived the following from [facility]: Outside name: aspirin delayed-release 81 mg tablet    diclofenac sodium (VOLTAREN) 1 % GEL Apply 2 g topically 4 times daily    docusate (COLACE, DULCOLAX) 100 MG CAPS Take 100 mg by mouth daily (Patient not taking: Reported on 08/19/2024)    Estradiol (VAGIFEM) 10 MCG TABS vaginal tablet Place 1 tablet vaginally (Patient not taking: Reported on 08/19/2024)            Lab Review:     Recent Labs     10/10/24  2151 10/11/24  0602   WBC 14.8* 11.2*   HGB 12.5 9.5*   HCT 38.9 30.3*   PLT 366 304     Recent Labs     10/10/24  2151 10/11/24  0602   NA 135* 135*   K 3.9 3.6   CL  101 106   CO2 23 20   BUN 16 12   MG  --  1.2*   ALT 8*  --      No components found for: GLPOC        "

## 2024-10-11 NOTE — ED Notes (Signed)
"  Bedside and Verbal shift change report given to Liezel, CHARITY FUNDRAISER (cabin crew) by Rosina, RN (offgoing nurse). Report included the following information Nurse Handoff Report, ED Encounter Summary, ED SBAR, Adult Overview, MAR, Recent Results, and Neuro Assessment.     "

## 2024-10-11 NOTE — H&P (Addendum)
 "                                                           Hospitalist Admission Note    NAME: Chelsea Schultz   DOB:  02-08-1926   MRN:  [id number]     Date/Time:  10/11/2024 12:12 AM    Patient PCP: Gonzella Lorane MATSU, MD - Admission Date: 10/10/2024       Assessment & Plan:     Summary: 89 year old female with chronic lung disease, diabetes, hypertension, and recurrent UTIs who presented with generalized weakness and wet cough.    #Acute hypoxic respiratory failure due to chronic constitutional lung disease  Evidence: Still requiring 2 L NC O2 to maintain O2 sat goals.  Wet cough for one week; tachycardia HR 105; slightly tachypneic; chest X-ray with diffuse bilateral interstitial opacities and interstitial edema.  BNP 628  Plan:  - Admit to inpatient level of care due to high risk for decompensation or complications from acute hypoxic respiratory failure.  - Oxygen therapy via nasal cannula; monitor pulse oximetry and respiratory status.  - Chest CT to confirm pneumonia versus chronic interstitial changes versus pulmonary edema  - See plan below for severe sepsis  - To consider checking ABG if O2 demand persist    # Severe sepsis secondary to ?Pneumonia  Evidence: HR 105; temperature 100F; WBC 14.8 10/L; wet cough; sick contacts; chest X-ray with interstitial opacities.  Associated with acute hypoxic respiratory failure  Plan:  - Monitor for progression of sepsis; repeat WBC and temperature.  - Continue ceftriaxone  and azithromycin  for now as indicated for suspected respiratory infection.  - Monitor hemodynamics and organ function.  -As needed analgesia, antiemetics, antipyretics  - As needed nebs and Tessalon     #Hyperglycemia  Evidence: Glucose 179 mg/dL; history of type 2 diabetes.  Plan:  - Monitor blood glucose; adjust diabetes medications as needed.    #Hypercalcemia  Evidence: Calcium  10.9 mg/dL.  Plan:  - Recheck calcium  level; assess for symptoms; ensure adequate  hydration.    #Hypoalbuminemia  Evidence: Albumin 2.8 g/dL.  Plan:  - Check prealbumin and UA for possible proteinuria  - Monitor nutritional status; consider dietary consult.    # History of recurrent urinary tract infections  # History of OAB  - Monitor for urinary symptoms; urinalysis if indicated.  -Continue OAB home regimen    #Type 2 diabetes mellitus  Evidence: Known history of type 2 diabetes; glucose 179 mg/dL.  Plan:  - Continue home diabetes regimen; monitor for complications.    #Hypertension  Evidence: Known history of hypertension.  Plan:  - Continue antihypertensive medications; monitor blood pressure.      Diet: Diabetic cardiac diet  Code: Full Code  IVF: NS 0.9% 75 mL/h  VTE prophylaxis: Lovenox   GI Prophylaxis: not indicated    Disposition: Anticipated LOS is greater than or equal to two midnights.       Chief Complaint:     Generalized weakness    History of Present Illness:     A 89 year old female with chronic constitutional lung disease, recurrent urinary tract infections, OAB, hyperlipidemia, type 2 diabetes, and hypertension presented to the ED with generalized weakness and fatigue ongoing all day, patient is a limited historian.  HPI obtained from ER records.  According to  ER records generalized weakness was accompanied by a wet cough persisting for one week.  Daughter at the time reported patient had sick contacts at home, as her home caretakers had similar respiratory symptoms.    Initial ED evaluation revealed tachycardia (heart rate 105 bpm), slight tachypnea, hypoxia with O? saturation 88% on 2 L nasal cannula, and a temperature of 100F. Laboratory studies showed glucose 179 mg/dL, calcium  10.9 mg/dL, albumin 2.8 g/dL, WBC 85.1 89/O, and BNP 60,028 pg/mL. Influenza and COVID screens were negative. Chest X-ray demonstrated diffuse bilateral interstitial opacities concerning for prominent interstitial edema and chronic lung disease. She received acetaminophen  1000 mg PO and  azithromycin  500 mg IV in the ED; ceftriaxone  was discontinued prior to administration.    The Emergency department physician requested admission for severe sepsis with acute hypoxic respiratory failure requiring ongoing oxygen therapy and close monitoring.        Home Medications:     Prior to Admission medications   Medication Sig Start Date End Date Taking? Authorizing Provider   pregabalin  (LYRICA ) 100 MG capsule TAKE 1 CAPSULE BY MOUTH DAILY AS NEEDED FOR NERVE PAIN 09/16/24 12/17/24  Guadelupe Mace POUR, MD   metFORMIN  (GLUCOPHAGE ) 500 MG tablet TAKE 1 TABLET BY MOUTH TWICE DAILY WITH A MEAL 09/02/24   Ledwith, Alexandra G, MD   famotidine  (PEPCID ) 40 MG tablet TAKE 1/2 TABLET BY MOUTH EVERY MORNING AND 1/2 TABLET AT BEDTIME 08/21/24   Ledwith, Alexandra G, MD   losartan  (COZAAR ) 100 MG tablet TAKE 1 TABLET BY MOUTH DAILY 08/21/24 08/16/25  Ledwith, Alexandra G, MD   Vaginal Lubricant (VAGISIL LUBRICANT) GEL Place vaginally    [provider]   vitamin B-12 (CYANOCOBALAMIN) 1000 MCG tablet     [provider]   Cholecalciferol (VITAMIN D-3 PO) Take by mouth    [provider]   polyethylene glycol (GLYCOLAX ) 17 GM/SCOOP powder Take 17 g by mouth daily as needed (constipation) 08/01/24   Guadelupe Mace POUR, MD   atorvastatin  (LIPITOR ) 10 MG tablet Take 1 tablet by mouth daily 08/01/24   Uhlar, Adrianna K, MD   carvedilol  (COREG ) 3.125 MG tablet Take 1 tablet by mouth 2 times daily 08/01/24   Uhlar, Adrianna K, MD   Diabetic Shoe MISC by Does not apply route  Patient not taking: Reported on 08/19/2024 08/01/24   Guadelupe Mace POUR, MD   amLODIPine  (NORVASC ) 10 MG tablet TAKE 1 TABLET(10 MG) BY MOUTH EVERY NIGHT 07/17/24   Ledwith, Alexandra G, MD   mirabegron  (MYRBETRIQ ) 50 MG TB24 Take 50 mg by mouth daily 06/26/23   Ledwith, Alexandra G, MD   METAMUCIL FIBER PO Take by mouth    [provider]   trospium  (SANCTURA ) 20 MG tablet Take 1 tablet by mouth daily    [provider]   acetaminophen  (TYLENOL ) 650 MG extended release tablet Take 1 tablet by mouth daily as needed    Automatic Reconciliation, Ar   ascorbic acid (VITAMIN C) 1000 MG tablet Take 1 tablet by mouth daily    Automatic Reconciliation, Ar   aspirin 81 MG EC tablet ceived the following from [facility]: Outside name: aspirin delayed-release 81 mg tablet 10/31/21   Automatic Reconciliation, Ar   diclofenac sodium (VOLTAREN) 1 % GEL Apply 2 g topically 4 times daily    Automatic Reconciliation, Ar   docusate (COLACE, DULCOLAX) 100 MG CAPS Take 100 mg by mouth daily  Patient not taking: Reported on 08/19/2024  Automatic Reconciliation, Ar   Estradiol (VAGIFEM) 10 MCG TABS vaginal tablet Place 1 tablet vaginally  Patient not taking: Reported on 08/19/2024 12/01/21 12/01/22  Automatic Reconciliation, Ar       Allergies:     Allergies   Allergen Reactions    Nitrofurantoin Rash     Was placed on it 09/2019 and pt noted rash on her hand immediately after starting antibx. Will add it back to her allergy list.        Past Surgical History:     Past Surgical History:   Procedure Laterality Date    ORTHOPEDIC SURGERY      Lumbar laminectomy    ORTHOPEDIC SURGERY      Lumbar fusion        Past Medical History:     Past Medical History:   Diagnosis Date    Arthritis     CVA (cerebral infarction)     Diabetes (HCC) 01/09/2015    Hemoglobin A1C 6.4 (per records, done at outside facility)    GERD (gastroesophageal reflux disease)     History of recurrent UTIs     on chronic antibiotic prophylaxis, followed by urology    HLD (hyperlipidemia) 01/09/2015    Total Chol 141, Tri 49, HDL 59, LDL 72    Hypertension     Neuropathy     Osteopetrosis     Spinal stenosis         Family History:     Family History   Problem Relation Age of Onset    Stroke Father     Cancer Sister     Hypertension Father     Hypertension Mother     Stroke Mother     Diabetes Mother     Stroke Sister         Social History:     Social History     Tobacco  Use    Smoking status: Never    Smokeless tobacco: Never   Substance Use Topics    Alcohol use: No        Physical Exam:     Vitals:    10/10/24 2144   BP:    Pulse: (!) 105   Resp: 18   Temp: 100 F (37.8 C)   SpO2: 91%     SpO2 Readings from Last 6 Encounters:   10/10/24 91%   08/19/24 96%   06/26/23 97%   09/05/22 96%   06/19/22 96%   05/11/22 94%        No intake or output data in the 24 hours ending 10/11/24 0012     General: Sleeping and difficult to arouse.  Appears fatigued when awake sitter  Eyes: No conjunctival injection or icterus.  HENT: Atraumatic; tacky mucous membranes  Cardiac: Regular rate and rhythm, normal S1S2, no murmurs or gallops.  Respiratory: Decreased breath sounds bibasilar with rhonchi  Abdomen: Soft, non-tender, non-distended, decreased bowel sounds present, no rebound or guarding.  MS: Extremities atraumatic; no edema; no calf tenderness to palpation.  Skin: no cyanosis, or clubbing.      Recent Results & Studies:     I have reviewed the following pertinent result(s):     Labs:  Labs Reviewed   CBC WITH AUTO DIFFERENTIAL - Abnormal; Notable for the following components:       Result Value    WBC 14.8 (*)     Lymphocytes % 10.2 (*)     Neutrophils Absolute 10.97 (*)     Monocytes  Absolute 1.89 (*)     Immature Granulocytes Absolute 0.06 (*)     All other components within normal limits   COMPREHENSIVE METABOLIC PANEL - Abnormal; Notable for the following components:    Sodium 135 (*)     Glucose 179 (*)     BUN/Creatinine Ratio 24 (*)     Calcium  10.9 (*)     ALT 8 (*)     Albumin 2.8 (*)     Globulin 4.8 (*)     Albumin/Globulin Ratio 0.6 (*)     All other components within normal limits   BRAIN NATRIURETIC PEPTIDE - Abnormal; Notable for the following components:    NT Pro-BNP 628 (*)     All other components within normal limits   COVID-19 & INFLUENZA COMBO   URINALYSIS WITH MICROSCOPIC       Radiology:  Interpretation per the Radiologist below, if available at the time of this  note:    XR CHEST PORTABLE   Final Result   1. Diffuse bilateral interstitial opacities may represent some combination of   pulmonary interstitial edema and chronic interstitial lung disease.         Electronically signed by Wendelyn JINNY Hoops               Medical Decision Making:       Total time spent counseling, discussing lab and imaging results, creating/addressing management plans, and coordinating care: 80 minutes, including face to face time (obtaining history and physical exam) and clinical time (reviewing previous labs/imaging reports, ordering additional imaging, labs, changing medications, and documenting in the record). This excludes procedure time unless otherwise indicated.       Electronically Signed by:  Ethel BROCKS Laquetta, DO     10/11/2024        This note was dictated utilizing Dragon Voice Recognition Software. Typographical and grammatical errors may exist due to voice recognition incompatibilities.          "

## 2024-10-12 LAB — CBC WITH AUTO DIFFERENTIAL
Basophils %: 0.4 % (ref 0.0–1.0)
Basophils Absolute: 0.05 K/UL (ref 0.00–0.10)
Eosinophils %: 2.9 % (ref 0.0–7.0)
Eosinophils Absolute: 0.4 K/UL (ref 0.00–0.40)
Hematocrit: 33.1 % — ABNORMAL LOW (ref 35.0–47.0)
Hemoglobin: 10.5 g/dL — ABNORMAL LOW (ref 11.5–16.0)
Immature Granulocytes %: 0.4 % (ref 0.0–0.5)
Immature Granulocytes Absolute: 0.06 K/UL — ABNORMAL HIGH (ref 0.00–0.04)
Lymphocytes %: 8.8 % — ABNORMAL LOW (ref 12.0–49.0)
Lymphocytes Absolute: 1.21 K/UL (ref 0.80–3.50)
MCH: 26.6 pg (ref 26.0–34.0)
MCHC: 31.7 g/dL (ref 30.0–36.5)
MCV: 83.8 FL (ref 80.0–99.0)
MPV: 10.2 FL (ref 8.9–12.9)
Monocytes %: 12.8 % (ref 5.0–13.0)
Monocytes Absolute: 1.75 K/UL — ABNORMAL HIGH (ref 0.00–1.00)
Neutrophils %: 74.7 % (ref 32.0–75.0)
Neutrophils Absolute: 10.22 K/UL — ABNORMAL HIGH (ref 1.80–8.00)
Nucleated RBCs: 0 /100{WBCs}
Platelets: 346 K/uL (ref 150–400)
RBC: 3.95 M/uL (ref 3.80–5.20)
RDW: 12.3 % (ref 11.5–14.5)
WBC: 13.7 K/uL — ABNORMAL HIGH (ref 3.6–11.0)
nRBC: 0 K/uL (ref 0.00–0.01)

## 2024-10-12 LAB — BASIC METABOLIC PANEL
Anion Gap: 9 mmol/L (ref 2–14)
BUN/Creatinine Ratio: 17 (ref 12–20)
BUN: 8 mg/dL (ref 8–23)
CO2: 25 mmol/L (ref 20–29)
Calcium: 9.6 mg/dL (ref 8.2–9.6)
Chloride: 100 mmol/L (ref 98–107)
Creatinine: 0.49 mg/dL — ABNORMAL LOW (ref 0.60–1.00)
Est, Glom Filt Rate: 86 ml/min/1.73m2 (ref >59)
Glucose: 180 mg/dL — ABNORMAL HIGH (ref 65–100)
Potassium: 3.7 mmol/L (ref 3.5–5.1)
Sodium: 133 mmol/L — ABNORMAL LOW (ref 136–145)

## 2024-10-12 MED ORDER — PSYLLIUM 51.7 % PO PACK
51.7 | Freq: Two times a day (BID) | ORAL | Status: DC
Start: 2024-10-12 — End: 2024-10-19
  Administered 2024-10-13 – 2024-10-17 (×8): 1 via ORAL

## 2024-10-12 MED ORDER — POLYETHYLENE GLYCOL 3350 17 G PO PACK
17 | Freq: Every day | ORAL | Status: DC
Start: 2024-10-12 — End: 2024-10-19
  Administered 2024-10-13 – 2024-10-17 (×4): 17 g via ORAL

## 2024-10-12 MED ORDER — QUETIAPINE FUMARATE 25 MG PO TABS
25 | Freq: Four times a day (QID) | ORAL | Status: DC | PRN
Start: 2024-10-12 — End: 2024-10-19
  Administered 2024-10-17: 02:00:00 25 mg via ORAL

## 2024-10-12 MED ORDER — IPRATROPIUM-ALBUTEROL 0.5-2.5 (3) MG/3ML IN SOLN
0.5-2.5 | RESPIRATORY_TRACT | Status: DC | PRN
Start: 2024-10-12 — End: 2024-11-03

## 2024-10-12 MED ORDER — POLYETHYLENE GLYCOL 3350 17 G PO PACK
17 | ORAL | Status: DC
Start: 2024-10-12 — End: 2024-10-12

## 2024-10-12 MED FILL — TROSPIUM CHLORIDE 20 MG PO TABS: 20 mg | ORAL | Qty: 1 | Fill #0

## 2024-10-12 MED FILL — MAGNESIUM OXIDE -MG SUPPLEMENT 400 (240 MG) MG PO TABS: 400 (240 Mg) MG | ORAL | Qty: 1 | Fill #0

## 2024-10-12 MED FILL — METAMUCIL 4 IN 1 FIBER 51.7 % PO PACK: 51.7 % | ORAL | Qty: 1 | Fill #0

## 2024-10-12 MED FILL — CEFTRIAXONE SODIUM 1 G IJ SOLR: 1 g | INTRAMUSCULAR | Qty: 1000 | Fill #0

## 2024-10-12 MED FILL — ATORVASTATIN CALCIUM 10 MG PO TABS: 10 mg | ORAL | Qty: 1 | Fill #0

## 2024-10-12 MED FILL — AZITHROMYCIN 500 MG IV SOLR: 500 mg | INTRAVENOUS | Qty: 500 | Fill #0

## 2024-10-12 MED FILL — ENOXAPARIN SODIUM 30 MG/0.3ML IJ SOSY: 30 MG/0.3ML | INTRAMUSCULAR | Qty: 0.3 | Fill #0

## 2024-10-12 MED FILL — CARVEDILOL 3.125 MG PO TABS: 3.125 mg | ORAL | Qty: 1 | Fill #0

## 2024-10-12 MED FILL — LOSARTAN POTASSIUM 50 MG PO TABS: 50 mg | ORAL | Qty: 2 | Fill #0

## 2024-10-12 MED FILL — ACETAMINOPHEN 325 MG PO TABS: 325 mg | ORAL | Qty: 2 | Fill #0

## 2024-10-12 NOTE — Plan of Care (Signed)
 "  Problem: Physical Therapy - Adult  Goal: By Discharge: Performs mobility at highest level of function for planned discharge setting.  See evaluation for individualized goals.  Description: FUNCTIONAL STATUS PRIOR TO ADMISSION: Patient required 1 person assistance for stand pivot transfers from bed <> wheelchair and was non-ambulatory. Stair lift inside home with upstairs bedroom. Ramped entry to home. Requires assistance for most ADLs.    HOME SUPPORT PRIOR TO ADMISSION: The patient lives with daughter and has daily caregiver assistance while daughter is working.    Physical Therapy Goals  Initiated 10/12/2024  1.  Patient will move from supine to sit and sit to supine, scoot up and down, and roll side to side in bed with minimal assistance within 7 day(s).    2.  Patient will perform sit to stand with minimal assistance within 7 day(s).  3.  Patient will sit on EOB with supervision x 5 minutes within 7 day(s).  4.  Patient will transfer from bed to chair and chair to bed with minimal assistance using the least restrictive device within 7 day(s).    Outcome: Progressing     PHYSICAL THERAPY EVALUATION    Patient: Chelsea Schultz (89 y.o. female)  Date: 10/12/2024  Primary Diagnosis: Hypoxemia [R09.02]  Severe sepsis (HCC) [A41.9, R65.20]  Community acquired pneumonia, unspecified laterality [J18.9]       Precautions:              ASSESSMENT :   From the initial evaluation, the patient is limited by decreased functional mobility, independence in ADLs, ROM, strength, activity tolerance, safety awareness, cognition, coordination, balance due to his/her hospitalization for septic PNA. H/o dementia, UTI.    Skilled therapy services are medically necessary in this setting to address these impairments.     Patient presents below prior level of function, currently requiring Mod A x 2 for functional bed mobility and transfers, unable to stand fully erect due to retropulsion, fear of falling, and decreased activity  tolerance. Requires multimodal cueing, increased time, and one step directives for participation and conversation. SpO2 90% on 3L NC with activity and titrated back up to 4L at end of session with SpO2 93%. Recommending rehabilitation at moderate intensity vs. HHPT depending on level of assist able to be provided in home setting.     Functional Outcome Measure:  The patient scored 12 on the Ampac outcome measure which is indicative of increased odds of requiring post acute SNF/IPR upon d/c .         PLAN :  Recommendations and Planned Interventions:   bed mobility training, transfer training, therapeutic exercises, patient and family training/education, and therapeutic activities    Frequency/Duration: Patient will be followed by physical therapy to address goals, PT Plan of Care: 5 times/week to address goals.      Recommendations for mobility with staff: Recommend that staff assists the patient to meet their mobility and care needs at the bed level until progress has been made with therapy.    Recommendations for toileting with staff:  recommended toilet device: a bed pan and an external urinary catheter.       Recommendation for discharge: (in order for the patient to meet his/her long term goals):   Moderate intensity short-term skilled physical therapy up to 5x/week vs HHPT with increased physical assistance    Other factors to consider for discharge: high risk for falls    IF patient discharges home will need the following DME: mechanical lift may be  beneficial if family willing                SUBJECTIVE:   Patient stated To you as well. (in response to [facility] Christmas)    OBJECTIVE DATA SUMMARY:       Past Medical History:   Diagnosis Date    Arthritis     CVA (cerebral infarction)     Diabetes (HCC) 01/09/2015    Hemoglobin A1C 6.4 (per records, done at outside facility)    GERD (gastroesophageal reflux disease)     History of recurrent UTIs     on chronic antibiotic prophylaxis, followed by urology     HLD (hyperlipidemia) 01/09/2015    Total Chol 141, Tri 49, HDL 59, LDL 72    Hypertension     Neuropathy     Osteopetrosis     Spinal stenosis      Past Surgical History:   Procedure Laterality Date    ORTHOPEDIC SURGERY      Lumbar laminectomy    ORTHOPEDIC SURGERY      Lumbar fusion       Home Situation:  Social/Functional History  Lives With: Daughter  Type of Home: House  Home Layout: One level  Home Access: Ramped entrance  Bathroom Shower/Tub: Cabin Crew: Tub transfer bench, Grab bars around toilet  Home Equipment: Hospital bed, Wheelchair - Manual, Environmental Consultant - Doctor, General Practice Help From: Family, Personal care attendant  Prior Level of Assist for ADLs: Needs assistance  Toileting: Needs assistance  Prior Level of Assist for Homemaking: Needs assistance  Homemaking Responsibilities: No  Prior Level of Assist for Ambulation: Needs assistance (Stand pivot transfer to wheelchair)  Prior Level of Assist for Transfers: Needs assistance  Active Driver: No  Mode of Transportation: Company Secretary Status:  Orientation  Overall Orientation Status: Impaired  Orientation Level: Oriented to person  Cognition  Overall Cognitive Status: Exceptions  Arousal/Alertness: Delayed responses to stimuli;Inconsistent responses to stimuli  Following Commands: Impaired;Follows one step commands with repetition  Attention Span: Impaired;Difficulty attending to directions;Difficulty dividing attention  Memory: Impaired  Safety Judgement: Impaired  Problem Solving: Impaired;Assistance required to correct errors made;Assistance required to identify errors made  Insights: Decreased awareness of deficits  Initiation: Requires cues for some  Sequencing: Requires cues for some    Strength:    Strength: Generally decreased, functional (H/o LLE hemiparesis)    Range Of Motion:  AROM: Generally decreased, functional       Functional Mobility:  Bed Mobility:     Bed Mobility Training  Bed Mobility Training:  Yes  Interventions: Verbal cues;Tactile cues;Weight shifting training/pressure relief;Safety awareness training  Sit to Supine: Partial/Moderate assistance;2 Person assistance  Scooting: Partial/Moderate assistance;2 Person assistance;Substantial/Maximal assistance  Transfers:     Transfer Training  Transfer Training: Yes  Interventions: Safety awareness training;Tactile cues;Verbal cues  Sit to Stand: 2 Person assistance;Partial/Moderate assistance (Minimal hip clearance (pt short stature), feet sliding anteriorly with patient leaning back)  Stand to Sit: Partial/Moderate assistance;2 Person assistance  Balance:               Balance  Sitting: Impaired  Sitting - Static: Fair (occasional);Poor (constant support)  Sitting - Dynamic: Fair (occasional);Poor (constant support)  Standing: Impaired  Standing - Static: Poor  Standing - Dynamic: Not tested  [employer] AM-PAC      Basic Mobility Inpatient Short Form (6-Clicks) Version 2    How much help is needed turning from your back to your side while in a flat bed without using bedrails?: A Little  How much help is needed moving from lying on your back to sitting on the side of a flat bed without using bedrails?: A Little  How much help is needed moving to and from a bed to a chair?: A Lot  How much help is needed standing up from a chair using your arms?: A Lot  How much help is needed walking in hospital room?: Total  How much help is needed climbing 3-5 steps with a railing?: Total    AM-PAC Inpatient Mobility Raw Score : 12  AM-PAC Inpatient T-Scale Score : 35.33     Cutoff score <=171,2,3 had higher odds of discharging home with home health or need of SNF/IPR.    1. Diane U. Jette, Ronal Broody, Vinoth K. Ranganathan, Sandra D.  Passek, Gilmore RAMAN. Waldemar Dale EMERSON Aneta.  Validity of the AM-PAC 6-Clicks Inpatient Daily Activity and Basic Mobility Short Forms. Physical Therapy Mar 2014, 94 (3) 379-391; DOI: 10.2522/ptj.20130199  2. Warren M, Knecht J, Verheijde J, Tompkins J. Association of AM-PAC 6-Clicks Basic Mobility and Daily Activity Scores With Discharge Destination. Phys Ther. 2021 Apr 4;101(4):pzab043. doi: 10.1093/ptj/pzab043. PMID: 66482536.  3. Herbold J, Rajaraman D, Waddell RAMAN, Agayby K, [place] S. Activity Measure for Post-Acute Care 6-Clicks Basic Mobility Scores Predict Discharge Destination After Acute Care Hospitalization in Select Patient Groups: A Retrospective, Observational Study. Arch Rehabil Res Clin Transl. 2022 Jul 16;4(3):100204. doi: 10.1016/j.arrct.7977.899795. PMID: 63876017; PMCID: [id number].  4. Aneta DELENA Darryle RAMAN, Coster W, Ni P. AM-PAC Short Forms Manual 4.0. Revised 11/2018.                                                                                                                                                                                                                               Treatment/Education Rendered:     Yes  Treatment intervention was deemed medically necessary and was provided to the patient under the charge(s) of Therapeutic Activity to address the functional impairments.  The skilled treatment consisted of Verbal/tactile cueing provided during bed mobility and transfers for correct technique and patient safety. Activity training performed to promote engagement in ADLs and out-of-bed activity to prevent functional decline and consequences associated with prolonged immobility. Assistive  device recommendations provided and patient fit with appropriate device for safety. Cues provided for body positioning relative to seat and device, controlled descent, and sequencing transition of hands seated surface <> device.     Patient vital signs monitored to assess tolerance to activity.  Activity titrated to patient's symptoms for safety.       Patient Education  Education Given To: Patient  Education Provided: Role of Therapy;Plan of Care  Education Method: Verbal  Barriers to Learning: Cognition  Education Outcome: Continued education needed        Pain Rating:  0/10   Pain Intervention(s):       Activity Tolerance:   Good    After treatment:   Patient left in no apparent distress in bed, Call bell within reach, Bed/ chair alarm activated, Caregiver / family present, Side rails x3, and Updated patient's board on functional status and mobility recommendations    INTERDISCIPLINARY COMMUNICATION:   The patient's plan of care was discussed with: occupational therapist and registered nurse      Thank you for this referral.  Joesph Reels, PT  Minutes: 24      Physical Therapy Evaluation Charge Determination   History Examination Presentation Decision-Making   LOW Complexity : Zero comorbidities / personal factors that will impact the outcome / POC LOW Complexity : 1-2 Standardized tests and measures addressing body structure, function, activity limitation and / or participation in recreation  LOW Complexity : Stable, uncomplicated  AM-PAC  LOW    Based on the above components, the patient evaluation is determined to be of the following complexity level: Low   "

## 2024-10-12 NOTE — Plan of Care (Addendum)
 "Pt unchanged confused overnight. Pt very frequently removed NC O2 despite education by staff, resulting in rapid desaturations to 70s/80s. RN sat in pts room as much as possible to prevent pt from removing O2. Pt intermittently pleasant and intermittently swatting at staff when attempting to fix NC. Pt took evening meds in applesauce with small sips of water  without visual aspiration, however required RN encouragement/reminders to swallow food bolus. Refused AM meds despite multiple attempts by RN and education.       Edit: Approx 7:55am pt removed NC and would not allow any staff member to place pt back on O2. Pt swatting and yelling at staff to leave her alone. D/t persistent, dangerous maintaining desaturation to 70s, decision was made to place pt in soft wrist restraints to prevent pt from removing O2. This RN attempted to call both daughters, left a voicemail as neither replied. Daughter [name] called RN back who explained situation. [name] stated understating of situation but stated she did not live close by and that she would attempt to reach her local sister. Daughter, [name], called RN back at [phone] AM. RN explained situation again to [facility], who stated understanding of situation but requested that she be called twice before calling [name].     Problem: Skin/Tissue Integrity  Goal: Skin integrity remains intact  Description: 1.  Monitor for areas of redness and/or skin breakdown  2.  Assess vascular access sites hourly  3.  Every 4-6 hours minimum:  Change oxygen saturation probe site  4.  Every 4-6 hours:  If on nasal continuous positive airway pressure, respiratory therapy assess nares and determine need for appliance change or resting period  Outcome: Progressing  Flowsheets (Taken 10/11/2024 1523 by Mehmeti, Flossie, RN)  Skin Integrity Remains Intact: Monitor for areas of redness and/or skin breakdown     Problem: Safety - Adult  Goal: Free from fall injury  Outcome: Progressing     Problem: Chronic  Conditions and Co-morbidities  Goal: Patient's chronic conditions and co-morbidity symptoms are monitored and maintained or improved  Outcome: Progressing  Flowsheets (Taken 10/11/2024 1523 by Mehmeti, Flossie, RN)  Care Plan - Patient's Chronic Conditions and Co-Morbidity Symptoms are Monitored and Maintained or Improved: Monitor and assess patient's chronic conditions and comorbid symptoms for stability, deterioration, or improvement     Problem: Discharge Planning  Goal: Discharge to home or other facility with appropriate resources  Outcome: Progressing  Flowsheets (Taken 10/11/2024 1523 by Mehmeti, Flossie, RN)  Discharge to home or other facility with appropriate resources: Identify barriers to discharge with patient and caregiver     Problem: Respiratory - Adult  Goal: Achieves optimal ventilation and oxygenation  10/12/2024 0501 by Glendia Macintosh, RN  Outcome: Progressing  10/11/2024 1644 by Leonce [place] , RCP  Outcome: Progressing     Problem: Confusion  Goal: Confusion, delirium, dementia, or psychosis is improved or at baseline  Description: INTERVENTIONS:  1. Assess for possible contributors to thought disturbance, including medications, impaired vision or hearing, underlying metabolic abnormalities, dehydration, psychiatric diagnoses, and notify attending LIP  2. Institute high risk fall precautions, as indicated  3. Provide frequent short contacts to provide reality reorientation, refocusing and direction  4. Decrease environmental stimuli, including noise as appropriate  5. Monitor and intervene to maintain adequate nutrition, hydration, elimination, sleep and activity  6. If unable to ensure safety without constant attention obtain sitter and review sitter guidelines with assigned personnel  7. Initiate Psychosocial CNS and Spiritual Care consult, as indicated  Outcome: Progressing  Problem: Pain  Goal: Verbalizes/displays adequate comfort level or baseline comfort level  Outcome:  Progressing     Problem: Neurosensory - Adult  Goal: Achieves stable or improved neurological status  Outcome: Progressing  Goal: Achieves maximal functionality and self care  Outcome: Progressing     Problem: Cardiovascular - Adult  Goal: Maintains optimal cardiac output and hemodynamic stability  Outcome: Progressing  Goal: Absence of cardiac dysrhythmias or at baseline  Outcome: Progressing     Problem: Skin/Tissue Integrity - Adult  Goal: Skin integrity remains intact  Description: 1.  Monitor for areas of redness and/or skin breakdown  2.  Assess vascular access sites hourly  3.  Every 4-6 hours minimum:  Change oxygen saturation probe site  4.  Every 4-6 hours:  If on nasal continuous positive airway pressure, respiratory therapy assess nares and determine need for appliance change or resting period  Outcome: Progressing  Flowsheets (Taken 10/11/2024 1523 by Mehmeti, Flossie, RN)  Skin Integrity Remains Intact: Monitor for areas of redness and/or skin breakdown  Goal: Incisions, wounds, or drain sites healing without S/S of infection  Outcome: Progressing  Goal: Oral mucous membranes remain intact  Outcome: Progressing     Problem: Musculoskeletal - Adult  Goal: Return mobility to safest level of function  Outcome: Progressing  Goal: Maintain proper alignment of affected body part  Outcome: Progressing  Goal: Return ADL status to a safe level of function  Outcome: Progressing     Problem: Gastrointestinal - Adult  Goal: Minimal or absence of nausea and vomiting  Outcome: Progressing  Goal: Maintains or returns to baseline bowel function  Outcome: Progressing  Goal: Maintains adequate nutritional intake  Outcome: Progressing     Problem: Genitourinary - Adult  Goal: Absence of urinary retention  Outcome: Progressing     Problem: Infection - Adult  Goal: Absence of infection at discharge  Outcome: Progressing  Goal: Absence of infection during hospitalization  Outcome: Progressing     Problem: Metabolic/Fluid  and Electrolytes - Adult  Goal: Electrolytes maintained within normal limits  Outcome: Progressing  Goal: Hemodynamic stability and optimal renal function maintained  Outcome: Progressing  Goal: Glucose maintained within prescribed range  Outcome: Progressing     Problem: Hematologic - Adult  Goal: Maintains hematologic stability  Outcome: Progressing     "

## 2024-10-12 NOTE — Progress Notes (Addendum)
 "    Hospitalist Progress Note      NAME:  Chelsea Schultz   DOB:  1926-01-05  MRN:  [id number]    Date/Time: 10/12/2024  7:21 AM           Assessment / Plan:     Chelsea Schultz is a 89 y.o. female with DM2, hypertension, overactive bladder, and recurrent UTIs.  She was admitted to this hospital on 10/10/2024 for evaluation/management of acute hypoxic respiratory failure thought to be secondary to pulmonary fibrosis or community-acquired pneumonia.    #Acute hypoxemic respiratory failure  #Pulmonary fibrosis  #Community-acquired pneumonia: Reason for admission.  CT chest from admission shows diffuse interstitial prominence and ground glass opacities suspicious for pulmonary fibrosis and pneumonitis versus pneumonia.  BNP normal for age, procalcitonin not elevated.  Not clinically fluid overloaded on exam.  Lower suspicion for PE.  COVID and flu negative.  RVP negative.  Patient was never a smoker.  No known occupational exposures per daughter.  -Continue empiric ceftriaxone  and azithromycin  for now  -Continue scheduled nebulized bronchodilators  -Hold off on steroids for now  -Consider pulmonary consult if no significant improvement  -Wean oxygen as able    #UTI: POA.  Urine sample collection was delayed until after antibiotics were given, but UA still showed pyuria.  Daughter reports patient tends to get multiple UTIs each year.  -Continue empiric ceftriaxone   -Follow urine culture    #Sepsis: Met criteria on admission with elevated WBC (stable) and elevated heart rate (improved) with suspected pneumonia and UTI.  Procalcitonin not elevated.  -Continue ceftriaxone  and azithromycin  as above  -CBC tomorrow    #Delirium  -Soft wrist restraints  -Delirium precautions as able    #Normocytic anemia: Not present on admission.  Suspect she has chronic anemia that was masked by hemoconcentration on admission.  -CBC tomorrow    #Hyponatremia: POA, stable.    #Hypercalcemia: POA, improved.  Presumably from  dehydration.    #Hypomagnesemia  -Continue scheduled magnesium  x 3 days    #DM2 with hyperglycemia  -Hold home metformin     #Overactive bladder  -Continue home meds    #Hypertension  -Continue home carvedilol  and losartan   -Hold home amlodipine     #Hyperlipidemia  -Continue home atorvastatin     #Dementia: Mild/moderate per daughter.    I have personally reviewed the radiographs, laboratory data in Epic and decisions and statements above are based partially on this personal interpretation.      ----------------------------------------------  ADDENDUM: Ucx with possible Enterococcus.  Add amoxicillin -clavulanate.      ----------------------------------------------                     Care Plan discussed with: Patient and Nursing Staff.  Discussed with daughter Chelsea Schultz by phone    Discussed:  Care Plan    Prophylaxis:  Lovenox     Disposition: Pending PT/OT.  Anticipate rehab--daughter Chelsea Schultz amenable           ___________________________________________________    Attending Physician: Chelsea VEAR Mura, MD        Subjective:     Chief Complaint: Confusion, agitation    Chelsea Schultz developed some agitation this morning.  She frequently removes her oxygen and was confused.  Per daughter Chelsea Schultz, last night her mental status had significantly improved compared to when she came into the hospital.  Suspect the patient is sundowning at this point.    Unable to reliably complete ROS with patient due to confusion.  Objective:       Vitals:          Last 24hrs VS reviewed since prior progress note. Most recent are:    Vitals:    10/12/24 0645   BP: (!) 134/57   Pulse:    Resp:    Temp:    SpO2:      SpO2 Readings from Last 6 Encounters:   10/12/24 92%   08/19/24 96%   06/26/23 97%   09/05/22 96%   06/19/22 96%   05/11/22 94%          Intake/Output Summary (Last 24 hours) at 10/12/2024 0721  Last data filed at 10/12/2024 0449  Gross per 24 hour   Intake 450 ml   Output 800 ml   Net -350 ml          Exam:     Physical Exam:    Gen:   Awake, confused.  HEENT:  Pink conjunctivae, hearing intact to voice, no evident epistaxis  Neck:  Supple, no apparent masses or swelling on inspection  Resp: Normal respiratory effort.  Quiet lung sounds, no evident rales or wheezes  Card:  Normal S1, S2 without thrills or bruits, no significant peripheral pitting edema  Abd:  Soft, non-tender, not significantly distended  Musc:  No cyanosis or clubbing  Skin:  No rashes or ulcers on examined skin, normal color  Neuro: Not following simple commands.  No tremors.  Not oriented   Psych: Restless, not apparently responding to internal stimuli      Medications Reviewed: (see below)    Lab Data Reviewed: (see below)    ______________________________________________________________________    Medications:     Current Facility-Administered Medications   Medication Dose Route Frequency    prochlorperazine  (COMPAZINE ) injection 10 mg  10 mg IntraVENous Q6H PRN    glucose chewable tablet 16 g  4 tablet Oral PRN    dextrose  bolus 10% 125 mL  125 mL IntraVENous PRN    Or    dextrose  bolus 10% 250 mL  250 mL IntraVENous PRN    glucagon  injection 1 mg  1 mg SubCUTAneous PRN    dextrose  10 % infusion   IntraVENous Continuous PRN    sodium chloride  flush 0.9 % injection 5-40 mL  5-40 mL IntraVENous 2 times per day    sodium chloride  flush 0.9 % injection 5-40 mL  5-40 mL IntraVENous PRN    0.9 % sodium chloride  infusion   IntraVENous PRN    potassium chloride  (KLOR-CON ) extended release tablet 40 mEq  40 mEq Oral PRN    Or    potassium bicarb-citric acid  (EFFER-K) effervescent tablet 40 mEq  40 mEq Oral PRN    Or    potassium chloride  10 mEq/100 mL IVPB (Peripheral Line)  10 mEq IntraVENous PRN    magnesium  sulfate 2000 mg in 50 mL IVPB premix  2,000 mg IntraVENous PRN    enoxaparin  Sodium (LOVENOX ) injection 30 mg  30 mg SubCUTAneous Daily    acetaminophen  (TYLENOL ) tablet 650 mg  650 mg Oral Q6H PRN    Or    acetaminophen  (TYLENOL ) suppository 650 mg  650 mg Rectal Q6H PRN     0.9 % sodium chloride  infusion   IntraVENous Continuous    melatonin tablet 6 mg  6 mg Oral Nightly PRN    bisacodyl  (DULCOLAX) EC tablet 5 mg  5 mg Oral Daily PRN    magnesium  oxide (MAG-OX) tablet 400 mg  400 mg Oral  Daily    azithromycin  (ZITHROMAX ) 500 mg in sodium chloride  0.9 % 250 mL IVPB (Vial2Bag)  500 mg IntraVENous Q24H    cefTRIAXone  (ROCEPHIN ) 1,000 mg in sterile water  10 mL IV syringe  1,000 mg IntraVENous Q24H    atorvastatin  (LIPITOR ) tablet 10 mg  10 mg Oral Daily    carvedilol  (COREG ) tablet 3.125 mg  3.125 mg Oral BID    trospium  (SANCTURA ) tablet 20 mg  20 mg Oral BID AC    losartan  (COZAAR ) tablet 100 mg  100 mg Oral Daily    albuterol  (PROVENTIL ) (2.5 MG/3ML) 0.083% nebulizer solution 2.5 mg  2.5 mg Nebulization Q4H PRN    benzonatate  (TESSALON ) capsule 100 mg  100 mg Oral TID PRN    ipratropium 0.5 mg-albuterol  2.5 mg (DUONEB ) nebulizer solution 1 Dose  1 Dose Inhalation 4x Daily RT            Lab Review:     Recent Labs     10/10/24  2151 10/11/24  0602 10/12/24  0351   WBC 14.8* 11.2* 13.7*   HGB 12.5 9.5* 10.5*   HCT 38.9 30.3* 33.1*   PLT 366 304 346     Recent Labs     10/10/24  2151 10/11/24  0602 10/11/24  1744 10/12/24  0351   NA 135* 135*  --  133*   K 3.9 3.6  --  3.7   CL 101 106  --  100   CO2 23 20  --  25   BUN 16 12  --  8   MG  --  1.2* 2.2  --    ALT 8*  --   --   --      No components found for: [facility]        "

## 2024-10-12 NOTE — Plan of Care (Signed)
 "  Problem: Occupational Therapy - Adult  Goal: By Discharge: Performs self-care activities at highest level of function for planned discharge setting.  See evaluation for individualized goals.  Description: FUNCTIONAL STATUS PRIOR TO ADMISSION:  The patient lives with her daughter and her family. PMH of CVA. Pt has caregivers 9-5 daily, and additional 4 hours in the evening 3 days/week. She typically requires min A for bed mobility and stand pivot transfers to w/c. She performs grooming and feeding ADLs with setup, and has assistance for bathing, dressing and toileting.     Occupational Therapy Goals:  Initiated 10/12/2024  1.  Patient will perform grooming with Set-up seated in chair within 7 day(s).  2.  Patient will perform upper body dressing with Moderate Assist within 7 day(s).  3.  Patient will perform toilet transfers with Moderate Assist  within 7 day(s).  4.  Patient will perform all aspects of toileting with Moderate Assist within 7 day(s).  5.  Patient will participate in upper extremity therapeutic exercise/activities with Supervision for 5 minutes within 7 day(s).    6.  Patient will utilize energy conservation techniques during functional activities with verbal cues within 7 day(s).   Outcome: Progressing  OCCUPATIONAL THERAPY EVALUATION    Patient: Chelsea Schultz (89 y.o. female)  Date: 10/12/2024  Primary Diagnosis: Hypoxemia [R09.02]  Severe sepsis (HCC) [A41.9, R65.20]  Community acquired pneumonia, unspecified laterality [J18.9]         Precautions:                    ASSESSMENT :  From the initial evaluation, the patient is limited by decreased functional mobility, independence in ADLs, ROM, strength, activity tolerance, cognition, attention/concentration, balance due to her hospitalization for pneumonia and hypoxemia. PMH of CVA. She lives with family and has caregiver assistance 9-5. At baseline she performs stand pivot transfers with min A, uses w/c for functional mobility, and requires  assistance for bathing, dressing and toileting.   Pt was received in bed, A&O to self, daughter present. Pt on 3-4 L O2 throughout session with O2 sats 91-96%, does not use supplemental O2 at baseline. Pt required increased time and cueing for initiation and sequencing throughout session. Pt washed face in sitting with setup-min A and verbal cues. She transferred supine>sit with mod A x2, demo'd persistent posterior lean in sitting. Pt briefly stood with mod A x2, required blocking of feet and constant Ax2 for balance due to retropulsion, fear of falling and impaired activity tolerance. She was returned to bed with needs met and VSS. At this time pt is functioning below her baseline. Skilled therapy services are medically necessary in this setting to address these impairments.     Functional Outcome Measure:  The patient scored 20/100 on the Barthel Index outcome measure.         PLAN :  Recommendations and Planned Interventions:   self care training, therapeutic activities, functional mobility training, balance training, therapeutic exercise, endurance activities, patient education, home safety training, and family training/education    Frequency/Duration: OT Plan of Care: 5 times/week    Recommendations for mobility with staff: Recommend that staff assists the patient to meet their mobility and care needs at the bed level until progress has been made with therapy.    Recommendations for toileting with staff:  recommended toilet device: a bed pan and an external urinary catheter.     Recommendation for discharge: (in order for the patient to meet his/her long term goals):  Moderate intensity short-term skilled occupational therapy up to 5x/week pending progress    Other factors to consider for discharge: impaired cognition, high risk for falls, and concern for safely navigating or managing the home environment    IF patient discharges home will need the following DME: continuing to assess with progress        SUBJECTIVE:   Patient stated, To you as well  Prentiss Christmas).  OBJECTIVE DATA SUMMARY:     Past Medical History:   Diagnosis Date    Arthritis     CVA (cerebral infarction)     Diabetes (HCC) 01/09/2015    Hemoglobin A1C 6.4 (per records, done at outside facility)    GERD (gastroesophageal reflux disease)     History of recurrent UTIs     on chronic antibiotic prophylaxis, followed by urology    HLD (hyperlipidemia) 01/09/2015    Total Chol 141, Tri 49, HDL 59, LDL 72    Hypertension     Neuropathy     Osteopetrosis     Spinal stenosis      Past Surgical History:   Procedure Laterality Date    ORTHOPEDIC SURGERY      Lumbar laminectomy    ORTHOPEDIC SURGERY      Lumbar fusion          Expanded or extensive additional review of patient history:   Social/Functional History  Lives With: Daughter  Type of Home: House  Home Layout: One level  Home Access: Ramped entrance  Bathroom Shower/Tub: Cabin Crew: Tub transfer bench, Grab bars around toilet  Home Equipment: Hospital bed, Wheelchair - Manual, Environmental Consultant - Doctor, General Practice Help From: Family, Personal care attendant  Prior Level of Assist for ADLs: Needs assistance  Toileting: Needs assistance  Prior Level of Assist for Homemaking: Needs assistance  Homemaking Responsibilities: No  Prior Level of Assist for Ambulation: Needs assistance (Stand pivot transfer to wheelchair)  Prior Level of Assist for Transfers: Needs assistance  Active Driver: No  Mode of Transportation: Public Librarian Dominance: right     EXAMINATION OF PERFORMANCE DEFICITS:    Cognitive/Behavioral Status:  Orientation  Overall Orientation Status: Impaired  Orientation Level: Oriented to person  Cognition  Overall Cognitive Status: Exceptions  Arousal/Alertness: Delayed responses to stimuli;Inconsistent responses to stimuli  Following Commands: Impaired;Follows one step commands with repetition  Attention Span: Impaired;Difficulty attending to directions;Difficulty  dividing attention  Memory: Impaired  Safety Judgement: Impaired  Problem Solving: Impaired;Assistance required to correct errors made;Assistance required to identify errors made  Insights: Decreased awareness of deficits  Initiation: Requires cues for some  Sequencing: Requires cues for some    Range of Motion:   AROM: Generally decreased, functional  PROM: Generally decreased, functional      Strength:  Strength: Generally decreased, functional      Coordination:  Coordination: Generally decreased, functional          Functional Mobility and Transfers for ADLs:    Bed Mobility:     Bed Mobility Training  Bed Mobility Training: Yes  Interventions: Verbal cues;Tactile cues;Weight shifting training/pressure relief;Safety awareness training  Sit to Supine: Partial/Moderate assistance;2 Person assistance  Scooting: Partial/Moderate assistance;2 Person assistance;Substantial/Maximal assistance    Transfers:      Transfer Training  Transfer Training: Yes  Interventions: Safety awareness training;Tactile cues;Verbal cues  Sit to Stand: 2 Person assistance;Partial/Moderate assistance (Minimal hip clearance (pt short stature), feet sliding anteriorly with patient leaning back)  Stand to  Sit: Partial/Moderate assistance;2 Person assistance                     Balance:      Balance  Sitting: Impaired  Sitting - Static: Fair (occasional);Poor (constant support)  Sitting - Dynamic: Fair (occasional);Poor (constant support)  Standing: Impaired  Standing - Static: Poor  Standing - Dynamic: Not tested      ADL Assessment:          Feeding: Minimal assistance       Grooming: Minimal assistance  Grooming Skilled Clinical Factors: semisupine to wash face    UE Bathing: Maximum assistance            LE Bathing: Dependent/Total       UE Dressing: Maximum assistance       LE Dressing: Dependent/Total       Toileting: Dependent/Total                                                                                                                                                                                                                                              Barthel Index:    Barthel Index Scale  Feeding: Needs help, i.e. for cutting  Bathing: Cannot perform activity  Grooming: Cannot perform activity  Dressing: Cannot perform activity  Bowel Control: Occasional accidents or needs help with device  Bladder Control: Occasional accidents or needs help with device  Toilet Transfers: Cannot perform activity  Chair/Bed Trannsfers: Able to sit, but needs maximum assistance to transfer  Ambulation: Cannot perform activity  Stairs: Cannot perform activity  Total Barthel Index Score: 20       The Barthel ADL Index: Guidelines  1. The index should be used as a record of what a patient does, not as a record of what a patient could do.  2. The main aim is to establish degree of independence from any help, physical or verbal, however minor and for whatever reason.  3. The need for supervision renders the patient not independent.  4. A patient's performance should be established using the best available evidence. Asking the patient, friends/relatives and nurses are the usual sources, but direct observation and common sense are also important. However direct testing is not needed.  5. Usually the patient's performance over the preceding 24-48 hours is important, but occasionally longer periods will be relevant.  6. Middle categories imply that the patient supplies over 50 per cent of the effort.  7. Use of aids to be independent is allowed.    Score Interpretation (from Sinoff 1997)   80-100 Independent   60-79 Minimally independent   40-59 Partially dependent   20-39 Very dependent   <20 Totally dependent     -Mahoney, F.l., Barthel, D.W. (1965). Functional evaluation: the Barthel Index. [place] [address] (14)2.  -Sinoff, G., Ore, L. (1997). The Barthel activities of daily living index: self-reporting versus actual performance in the old (> or = 75 years).  Journal of American Geriatric Society 45(7), [phone].   -Fleeta cotton [place], J.J.M.F, Orman ROES., Oneita MERYL Sebastian DELORSE. (1999). Measuring the change in disability after inpatient rehabilitation; comparison of the responsiveness of the Barthel Index and Functional Independence Measure. Journal of Neurology, Neurosurgery, and Psychiatry, 66(4), [phone].  Marea Chillington, N.J.A, Scholte op Decherd,  W.J.M, & Koopmanschap, M.A. (2004) Assessment of post-stroke quality of life in cost-effectiveness studies: The usefulness of the Barthel Index and the EuroQoL-5D. Quality of Life Research, 13, [phone]                                                                                                                                                                                                                                 Treatment/Education Rendered:     Yes  Treatment intervention was deemed medically necessary and was provided to the patient under the charge(s) of Therapeutic activity to address the functional impairments.  The skilled treatment consisted of instruction on hand placement for bed mobility and facilitating anterior weight shift for improved posture/sitting balance EOB.          Pain Rating:  Pt reported no pain during session    Activity Tolerance:   Fair     After treatment:   Patient left in no apparent distress in bed, Call bell within reach, Bed/ chair alarm activated, Caregiver / family present, and Side rails x3    INTERDISCIPLINARY COMMUNICATION:   The patient's plan of care was discussed with: physical therapist and registered nurse    Thank you for this referral.  Lauraine FORBES Dross, OT  Minutes: 29    Occupational Therapy Evaluation Charge Determination   History Examination Decision-Making   LOW Complexity : Brief history review  MEDIUM Complexity: 3-5 Performance deficits relating to physical, cognitive, or psychosocial skills that result in activity limitations and/or participation  restrictions  MEDIUM Complexity: Patient may present with comorbidities that affect occupational performance. Minimal to moderate modifications of tasks or assist (eg. physical or verbal) with assist is necessary to enable pt to complete eval   Based on the above components, the patient evaluation is determined to be of the following complexity level: Low   "

## 2024-10-12 NOTE — Care Coordination (Signed)
 "    Care Management Initial Assessment  10/12/2024 2:50 PM  If patient is discharged prior to next notation, then this note serves as note for discharge by case management.    Reason for Admission:   Hypoxemia [R09.02]  Severe sepsis (HCC) [A41.9, R65.20]  Community acquired pneumonia, unspecified laterality [J18.9]         Patient Admission Status: Inpatient  Date Admitted to INP: 10/11/2024  RUR: Readmission Risk Score: 17.9    Hospitalization in the last 30 days (Readmission):  No        Advance Care Planning:  Code Status: Full Code  Primary Healthcare Decision Maker: (P) Legal Next of Kin   Advance Directive: not on file     __________________________________________________________________________  Assessment:      10/12/24 1445   Service Assessment   Information Provided By Child/Family   Patient Orientation Alert   Primary Caregiver Family;Private caregiver  (daughter, [name] is PCG. patient also has private pay CG)   Support Systems Family Members;Children   Patient's Healthcare Decision Maker is: Legal Next of Kin   PCP Verified by CM Yes  (SFFP)   Last Visit to PCP Within last 6 months   Prior Functional Level Unable to Assess   Current Functional Level at Time of Initial Assessment Unable to Assess   Can patient return to prior living arrangement Unknown at present   Ability to make needs known: Fair   Family able to assist with home care needs: Other (comment)  (patient will need to be able to assist with tx)   Receives Help From Family;Personal care attendant   Social/Functional History   Prior Level of Assist for ADLs Needs assistance   Toileting Needs assistance   Prior Level of Assist for Homemaking Needs assistance   Homemaking Responsibilities No   Prior Level of Assist for Transfers Needs assistance   Ambulation Assistance Needs assistance   Active Driver No   Mode of Economist    Location Prior to Acute Admission House   Lives With Daughter   Current Services Prior  To Admission Caregiver Support;Durable Medical Equipment;Private Duty Homecare   Current DME Prior to [facility] - Rolling;Wheelchair - Manual;Other (Comment)  (stair glide)   Patient expects to be discharged to: Home   Home Layout One level         Comments: patient lives with her daughter and SIL. Daughter, [name] is PCG. Patient has paid caregivers M-F 9am-5pm, 3 evenings a week from 5pm-9pm and occasionally on weekend she has PCG assistance if needed.    Discharge Concerns: [] Yes [] No [x] Unknown   Describe:    Financial concerns/barriers: [] Yes, explain: [x] No [] Unknown/Not discussed  __________________________________________________________________________    Insurer:   Active Insurance as of 10/10/2024       Primary Coverage       Payor Plan Insurance Group Employer/Plan Group    MEDICARE MEDICARE PART A AND B        Payor Address Payor Phone Number Payor Fax Number Effective Dates    [address] [address] [phone]  03/23/1993 - None Entered    [place] [address]         Subscriber Name Subscriber Birth Date Member ID       Chelsea Schultz, Chelsea Schultz [birth year] [id number]                     PCP: Gonzella Lorane MATSU, MD   Address: [phone] /  [place] [address]   Phone number: [phone]    Pharmacy:   [id number] - [place], [place] - [name] - P [phone] - F [phone]  [name]  [place] [address]  Phone: [phone] Fax: [phone]    [id number] - [address] [place] - [address] - P [phone] - F [phone]  [address]  [place] [address]  Phone: [phone] Fax: [phone]    [facility] [address] [place] - [address] - [place] [phone] - F [phone]  [address]  [place] [address]  Phone: [phone] Fax: [phone]    [facility] [address] [place] - [address] - P [phone] [place] [phone]  [address]  [place] [address]  Phone: [phone] Fax:  [phone]    DC Transport:         Transition of care plan:    [x] Unable to determine at this time. Awaiting clinical progress, and disposition recommendations.     PT/OT eval pending.     Patient needs to be able to assist with transfers for daughter/staff to care for at home.     If SNF is needed for rehab, patient has been to [facility] and would return.  If home health is need, patient has paid caregivers through [employer] and would use them for home health.     [name]  Case Management Department  For questions or concerns, please PerfectServe        "

## 2024-10-12 NOTE — Plan of Care (Signed)
 "  Problem: Skin/Tissue Integrity  Goal: Skin integrity remains intact  Description: 1.  Monitor for areas of redness and/or skin breakdown  2.  Assess vascular access sites hourly  3.  Every 4-6 hours minimum:  Change oxygen saturation probe site  4.  Every 4-6 hours:  If on nasal continuous positive airway pressure, respiratory therapy assess nares and determine need for appliance change or resting period  10/12/2024 1757 by Gabrielle Tinnie NOVAK, RN  Outcome: Progressing  10/12/2024 0501 by Glendia Macintosh, RN  Outcome: Progressing  Flowsheets (Taken 10/11/2024 1523 by Mehmeti, Flossie, RN)  Skin Integrity Remains Intact: Monitor for areas of redness and/or skin breakdown     Problem: Safety - Adult  Goal: Free from fall injury  10/12/2024 1757 by Gabrielle Tinnie NOVAK, RN  Outcome: Progressing  10/12/2024 0501 by Glendia Macintosh, RN  Outcome: Progressing     Problem: Chronic Conditions and Co-morbidities  Goal: Patient's chronic conditions and co-morbidity symptoms are monitored and maintained or improved  10/12/2024 1757 by Gabrielle Tinnie NOVAK, RN  Outcome: Progressing  10/12/2024 0501 by Glendia Macintosh, RN  Outcome: Progressing  Flowsheets (Taken 10/11/2024 1523 by Arlander Flossie, RN)  Care Plan - Patient's Chronic Conditions and Co-Morbidity Symptoms are Monitored and Maintained or Improved: Monitor and assess patient's chronic conditions and comorbid symptoms for stability, deterioration, or improvement     Problem: Discharge Planning  Goal: Discharge to home or other facility with appropriate resources  10/12/2024 1757 by Gabrielle Tinnie NOVAK, RN  Outcome: Progressing  10/12/2024 0501 by Glendia Macintosh, RN  Outcome: Progressing  Flowsheets (Taken 10/11/2024 1523 by Arlander Flossie, RN)  Discharge to home or other facility with appropriate resources: Identify barriers to discharge with patient and caregiver     Problem: Respiratory - Adult  Goal: Achieves optimal ventilation and oxygenation  10/12/2024 1757 by  Gabrielle Tinnie NOVAK, RN  Outcome: Progressing  10/12/2024 0501 by Glendia Macintosh, RN  Outcome: Progressing     Problem: Confusion  Goal: Confusion, delirium, dementia, or psychosis is improved or at baseline  Description: INTERVENTIONS:  1. Assess for possible contributors to thought disturbance, including medications, impaired vision or hearing, underlying metabolic abnormalities, dehydration, psychiatric diagnoses, and notify attending LIP  2. Institute high risk fall precautions, as indicated  3. Provide frequent short contacts to provide reality reorientation, refocusing and direction  4. Decrease environmental stimuli, including noise as appropriate  5. Monitor and intervene to maintain adequate nutrition, hydration, elimination, sleep and activity  6. If unable to ensure safety without constant attention obtain sitter and review sitter guidelines with assigned personnel  7. Initiate Psychosocial CNS and Spiritual Care consult, as indicated  10/12/2024 1757 by Gabrielle Tinnie NOVAK, RN  Outcome: Progressing  10/12/2024 0501 by Glendia Macintosh, RN  Outcome: Progressing     Problem: Pain  Goal: Verbalizes/displays adequate comfort level or baseline comfort level  10/12/2024 1757 by Gabrielle Tinnie NOVAK, RN  Outcome: Progressing  10/12/2024 0501 by Glendia Macintosh, RN  Outcome: Progressing     Problem: Physical Therapy - Adult  Goal: By Discharge: Performs mobility at highest level of function for planned discharge setting.  See evaluation for individualized goals.  Description: FUNCTIONAL STATUS PRIOR TO ADMISSION: Patient required 1 person assistance for stand pivot transfers from bed <> wheelchair and was non-ambulatory. Stair lift inside home with upstairs bedroom. Ramped entry to home. Requires assistance for most ADLs.    HOME SUPPORT PRIOR TO ADMISSION: The patient lives with daughter and has daily caregiver  assistance while daughter is working.    Physical Therapy Goals  Initiated 10/12/2024  1.  Patient will move  from supine to sit and sit to supine, scoot up and down, and roll side to side in bed with minimal assistance within 7 day(s).    2.  Patient will perform sit to stand with minimal assistance within 7 day(s).  3.  Patient will sit on EOB with supervision x 5 minutes within 7 day(s).  4.  Patient will transfer from bed to chair and chair to bed with minimal assistance using the least restrictive device within 7 day(s).    10/12/2024 1556 by Rayleen Search, PT  Outcome: Progressing     Problem: Occupational Therapy - Adult  Goal: By Discharge: Performs self-care activities at highest level of function for planned discharge setting.  See evaluation for individualized goals.  Description: FUNCTIONAL STATUS PRIOR TO ADMISSION:  The patient lives with her daughter and her family. PMH of CVA. Pt has caregivers 9-5 daily, and additional 4 hours in the evening 3 days/week. She typically requires min A for bed mobility and stand pivot transfers to w/c. She performs grooming and feeding ADLs with setup, and has assistance for bathing, dressing and toileting.     Occupational Therapy Goals:  Initiated 10/12/2024  1.  Patient will perform grooming with Set-up seated in chair within 7 day(s).  2.  Patient will perform upper body dressing with Moderate Assist within 7 day(s).  3.  Patient will perform toilet transfers with Moderate Assist  within 7 day(s).  4.  Patient will perform all aspects of toileting with Moderate Assist within 7 day(s).  5.  Patient will participate in upper extremity therapeutic exercise/activities with Supervision for 5 minutes within 7 day(s).    6.  Patient will utilize energy conservation techniques during functional activities with verbal cues within 7 day(s).   10/12/2024 1734 by Garwin Lauraine BRAVO, OT  Outcome: Progressing     Problem: Neurosensory - Adult  Goal: Achieves stable or improved neurological status  10/12/2024 1757 by Gabrielle Tinnie NOVAK, RN  Outcome: Progressing  10/12/2024 0501 by Glendia Macintosh, RN  Outcome: Progressing  Goal: Achieves maximal functionality and self care  10/12/2024 1757 by Gabrielle Tinnie NOVAK, RN  Outcome: Progressing  10/12/2024 0501 by Glendia Macintosh, RN  Outcome: Progressing     Problem: Cardiovascular - Adult  Goal: Maintains optimal cardiac output and hemodynamic stability  10/12/2024 1757 by Gabrielle Tinnie NOVAK, RN  Outcome: Progressing  10/12/2024 0501 by Glendia Macintosh, RN  Outcome: Progressing  Goal: Absence of cardiac dysrhythmias or at baseline  10/12/2024 1757 by Gabrielle Tinnie NOVAK, RN  Outcome: Progressing  10/12/2024 0501 by Glendia Macintosh, RN  Outcome: Progressing     Problem: Skin/Tissue Integrity - Adult  Goal: Skin integrity remains intact  Description: 1.  Monitor for areas of redness and/or skin breakdown  2.  Assess vascular access sites hourly  3.  Every 4-6 hours minimum:  Change oxygen saturation probe site  4.  Every 4-6 hours:  If on nasal continuous positive airway pressure, respiratory therapy assess nares and determine need for appliance change or resting period  10/12/2024 1757 by Gabrielle Tinnie NOVAK, RN  Outcome: Progressing  10/12/2024 0501 by Glendia Macintosh, RN  Outcome: Progressing  Flowsheets (Taken 10/11/2024 1523 by Arlander Ache, RN)  Skin Integrity Remains Intact: Monitor for areas of redness and/or skin breakdown  Goal: Incisions, wounds, or drain sites healing without S/S of infection  10/12/2024  1757 by Gabrielle Tinnie NOVAK, RN  Outcome: Progressing  10/12/2024 0501 by Glendia Macintosh, RN  Outcome: Progressing  Goal: Oral mucous membranes remain intact  10/12/2024 1757 by Gabrielle Tinnie NOVAK, RN  Outcome: Progressing  10/12/2024 0501 by Glendia Macintosh, RN  Outcome: Progressing     Problem: Musculoskeletal - Adult  Goal: Return mobility to safest level of function  10/12/2024 1757 by Gabrielle Tinnie NOVAK, RN  Outcome: Progressing  10/12/2024 0501 by Glendia Macintosh, RN  Outcome: Progressing  Goal: Maintain proper alignment of affected body  part  10/12/2024 1757 by Gabrielle Tinnie NOVAK, RN  Outcome: Progressing  10/12/2024 0501 by Glendia Macintosh, RN  Outcome: Progressing  Goal: Return ADL status to a safe level of function  10/12/2024 1757 by Gabrielle Tinnie NOVAK, RN  Outcome: Progressing  10/12/2024 0501 by Glendia Macintosh, RN  Outcome: Progressing     Problem: Gastrointestinal - Adult  Goal: Minimal or absence of nausea and vomiting  10/12/2024 1757 by Gabrielle Tinnie NOVAK, RN  Outcome: Progressing  10/12/2024 0501 by Glendia Macintosh, RN  Outcome: Progressing  Goal: Maintains or returns to baseline bowel function  10/12/2024 1757 by Gabrielle Tinnie NOVAK, RN  Outcome: Progressing  10/12/2024 0501 by Glendia Macintosh, RN  Outcome: Progressing  Goal: Maintains adequate nutritional intake  10/12/2024 1757 by Gabrielle Tinnie NOVAK, RN  Outcome: Progressing  10/12/2024 0501 by Glendia Macintosh, RN  Outcome: Progressing     Problem: Genitourinary - Adult  Goal: Absence of urinary retention  10/12/2024 1757 by Gabrielle Tinnie NOVAK, RN  Outcome: Progressing  10/12/2024 0501 by Glendia Macintosh, RN  Outcome: Progressing     Problem: Infection - Adult  Goal: Absence of infection at discharge  10/12/2024 1757 by Gabrielle Tinnie NOVAK, RN  Outcome: Progressing  10/12/2024 0501 by Glendia Macintosh, RN  Outcome: Progressing  Goal: Absence of infection during hospitalization  10/12/2024 1757 by Gabrielle Tinnie NOVAK, RN  Outcome: Progressing  10/12/2024 0501 by Glendia Macintosh, RN  Outcome: Progressing     Problem: Metabolic/Fluid and Electrolytes - Adult  Goal: Electrolytes maintained within normal limits  10/12/2024 1757 by Gabrielle Tinnie NOVAK, RN  Outcome: Progressing  10/12/2024 0501 by Glendia Macintosh, RN  Outcome: Progressing  Goal: Hemodynamic stability and optimal renal function maintained  10/12/2024 1757 by Gabrielle Tinnie NOVAK, RN  Outcome: Progressing  10/12/2024 0501 by Glendia Macintosh, RN  Outcome: Progressing  Goal: Glucose maintained within prescribed range  10/12/2024 1757 by  Gabrielle Tinnie NOVAK, RN  Outcome: Progressing  10/12/2024 0501 by Glendia Macintosh, RN  Outcome: Progressing     Problem: Hematologic - Adult  Goal: Maintains hematologic stability  10/12/2024 1757 by Gabrielle Tinnie NOVAK, RN  Outcome: Progressing  10/12/2024 0501 by Glendia Macintosh, RN  Outcome: Progressing     Problem: Safety - Medical Restraint  Goal: Remains free of injury from restraints (Restraint for Interference with Medical Device)  Description: INTERVENTIONS:  1. Determine that other, less restrictive measures have been tried or would not be effective before applying the restraint  2. Evaluate the patient's condition at the time of restraint application  3. Inform patient/family regarding the reason for restraint  4. Q2H: Monitor safety, psychosocial status, comfort, nutrition and hydration  Outcome: Progressing  Flowsheets (Taken 10/12/2024 0845)  Remains free of injury from restraints (restraint for interference with medical device):   Every 2 hours: Monitor safety, psychosocial status, comfort, nutrition and hydration   Inform patient/family regarding the reason for restraint   Evaluate the patient's condition  at the time of restraint application   Determine that other, less restrictive measures have been tried or would not be effective before applying the restraint     "

## 2024-10-13 ENCOUNTER — Inpatient Hospital Stay: Payer: MEDICARE

## 2024-10-13 LAB — CBC WITH AUTO DIFFERENTIAL
Basophils %: 0.4 % (ref 0.0–1.0)
Basophils Absolute: 0.07 K/UL (ref 0.00–0.10)
Eosinophils %: 1.1 % (ref 0.0–7.0)
Eosinophils Absolute: 0.2 K/UL (ref 0.00–0.40)
Hematocrit: 35.8 % (ref 35.0–47.0)
Hemoglobin: 11.7 g/dL (ref 11.5–16.0)
Immature Granulocytes %: 0.6 % — ABNORMAL HIGH (ref 0.0–0.5)
Immature Granulocytes Absolute: 0.11 K/UL — ABNORMAL HIGH (ref 0.00–0.04)
Lymphocytes %: 7.8 % — ABNORMAL LOW (ref 12.0–49.0)
Lymphocytes Absolute: 1.4 K/UL (ref 0.80–3.50)
MCH: 27.1 pg (ref 26.0–34.0)
MCHC: 32.7 g/dL (ref 30.0–36.5)
MCV: 83.1 FL (ref 80.0–99.0)
MPV: 10.5 FL (ref 8.9–12.9)
Monocytes %: 12.5 % (ref 5.0–13.0)
Monocytes Absolute: 2.24 K/UL — ABNORMAL HIGH (ref 0.00–1.00)
Neutrophils %: 77.6 % — ABNORMAL HIGH (ref 32.0–75.0)
Neutrophils Absolute: 13.88 K/UL — ABNORMAL HIGH (ref 1.80–8.00)
Nucleated RBCs: 0 /100{WBCs}
Platelets: 394 K/uL (ref 150–400)
RBC: 4.31 M/uL (ref 3.80–5.20)
RDW: 12.5 % (ref 11.5–14.5)
WBC: 17.9 K/uL — ABNORMAL HIGH (ref 3.6–11.0)
nRBC: 0 K/uL (ref 0.00–0.01)

## 2024-10-13 LAB — POCT GLUCOSE
POC Glucose: 182 mg/dL — ABNORMAL HIGH (ref 65–117)
POC Glucose: 174 mg/dL — ABNORMAL HIGH (ref 65–117)
POC Glucose: 200 mg/dL — ABNORMAL HIGH (ref 65–117)

## 2024-10-13 LAB — CULTURE, URINE: Colony count: >100000

## 2024-10-13 MED ORDER — GLUCOSE 4 G PO CHEW
4 | ORAL | Status: DC | PRN
Start: 2024-10-13 — End: 2024-10-24

## 2024-10-13 MED ORDER — INSULIN LISPRO 100 UNIT/ML IJ SOLN
100 | Freq: Four times a day (QID) | INTRAMUSCULAR | Status: DC
Start: 2024-10-13 — End: 2024-10-19
  Administered 2024-10-13 – 2024-10-14 (×2): 2 [IU] via SUBCUTANEOUS
  Administered 2024-10-14: 23:00:00 4 [IU] via SUBCUTANEOUS
  Administered 2024-10-14: 04:00:00 2 [IU] via SUBCUTANEOUS
  Administered 2024-10-15 (×2): 4 [IU] via SUBCUTANEOUS
  Administered 2024-10-15: 02:00:00 6 [IU] via SUBCUTANEOUS
  Administered 2024-10-15: 22:00:00 2 [IU] via SUBCUTANEOUS
  Administered 2024-10-16 (×2): 4 [IU] via SUBCUTANEOUS
  Administered 2024-10-16: 14:00:00 2 [IU] via SUBCUTANEOUS
  Administered 2024-10-17: 23:00:00 4 [IU] via SUBCUTANEOUS
  Administered 2024-10-18 – 2024-10-19 (×3): 2 [IU] via SUBCUTANEOUS

## 2024-10-13 MED ORDER — AMPICILLIN-SULBACTAM SODIUM 3 (2-1) G IJ SOLR
3 | Freq: Four times a day (QID) | INTRAMUSCULAR | Status: DC
Start: 2024-10-13 — End: 2024-10-20
  Administered 2024-10-13 – 2024-10-20 (×29): 3000 mg via INTRAVENOUS

## 2024-10-13 MED ORDER — DEXTROSE 10 % IV BOLUS
INTRAVENOUS | Status: DC | PRN
Start: 2024-10-13 — End: 2024-10-24

## 2024-10-13 MED ORDER — IOPAMIDOL 76 % IV SOLN
76 | Freq: Once | INTRAVENOUS | Status: AC | PRN
Start: 2024-10-13 — End: 2024-10-14
  Administered 2024-10-14: 07:00:00 80 mL via INTRAVENOUS

## 2024-10-13 MED ORDER — AMOXICILLIN-POT CLAVULANATE 875-125 MG PO TABS
875-125 | Freq: Two times a day (BID) | ORAL | Status: DC
Start: 2024-10-13 — End: 2024-10-13
  Administered 2024-10-13: 03:00:00 1 via ORAL

## 2024-10-13 MED ORDER — GLUCAGON (RDNA) 1 MG IJ KIT
1 | INTRAMUSCULAR | Status: DC | PRN
Start: 2024-10-13 — End: 2024-10-24

## 2024-10-13 MED ORDER — DEXTROSE 10 % IV SOLN
10 | INTRAVENOUS | Status: DC | PRN
Start: 2024-10-13 — End: 2024-10-24

## 2024-10-13 MED ORDER — STERILE WATER FOR INJECTION (MIXTURES ONLY)
40 | Freq: Three times a day (TID) | INTRAMUSCULAR | Status: DC
Start: 2024-10-13 — End: 2024-10-19
  Administered 2024-10-13 – 2024-10-19 (×18): 40 mg via INTRAVENOUS

## 2024-10-13 MED FILL — METHYLPREDNISOLONE SODIUM SUCC 40 MG IJ SOLR: 40 mg | INTRAMUSCULAR | Qty: 40 | Fill #0

## 2024-10-13 MED FILL — CEFTRIAXONE SODIUM 1 G IJ SOLR: 1 g | INTRAMUSCULAR | Qty: 1000 | Fill #0

## 2024-10-13 MED FILL — AMPICILLIN-SULBACTAM SODIUM 3 (2-1) G IJ SOLR: 3 (2-1) g | INTRAMUSCULAR | Qty: 3000 | Fill #0

## 2024-10-13 MED FILL — DEXTROSE 10 % IV SOLN: 10 % | INTRAVENOUS | Qty: 500 | Fill #0

## 2024-10-13 MED FILL — CARVEDILOL 3.125 MG PO TABS: 3.125 mg | ORAL | Qty: 1 | Fill #0

## 2024-10-13 MED FILL — INSULIN LISPRO 100 UNIT/ML IJ SOLN: 100 [IU]/mL | INTRAMUSCULAR | Qty: 2 | Fill #0

## 2024-10-13 MED FILL — AZITHROMYCIN 500 MG IV SOLR: 500 mg | INTRAVENOUS | Qty: 500 | Fill #0

## 2024-10-13 MED FILL — AMOXICILLIN-POT CLAVULANATE 875-125 MG PO TABS: 875-125 mg | ORAL | Qty: 1 | Fill #0

## 2024-10-13 NOTE — Progress Notes (Signed)
 "[facility]  [address] [place], [place] [address]  [phone]        Hospitalist Progress Note      NAME: Chelsea Schultz   DOB:  December 29, 1925  MRM:  [id number]    Date/Time of service: 10/13/2024  5:22 PM       Subjective:     Patient was personally seen and examined by me during this time period.  Chart reviewed.    Follow up pneumonia, respiratory failure, UTI, possible pulmonary fibrosis.    Patient reports: Patient is feeling better.  Amenable to placement       Objective:       Vitals:       Last 24hrs VS reviewed since prior progress note. Most recent are:    Vitals:    10/13/24 1528   BP: 129/63   Pulse: 93   Resp: 17   Temp: 99 F (37.2 C)   SpO2: 93%     SpO2 Readings from Last 6 Encounters:   10/13/24 93%   08/19/24 96%   06/26/23 97%   09/05/22 96%   06/19/22 96%   05/11/22 94%          Intake/Output Summary (Last 24 hours) at 10/13/2024 1722  Last data filed at 10/13/2024 1559  Gross per 24 hour   Intake --   Output 2250 ml   Net -2250 ml        Exam:     Physical Exam:    Gen:  Well-developed, well-nourished, in no acute distress  HEENT:  Pink conjunctivae, EOMI, hearing intact to voice, moist mucous membranes  Resp:  No accessory muscle use, clear breath sounds without wheezes rales or rhonchi  Card:  No murmurs, normal S1, S2 without thrills, bruits or peripheral edema  Abd:  Soft, non-tender, non-distended, no palpable organomegaly and no detectable hernias  Musc:  No cyanosis or clubbing  Skin:  No rashes or ulcers, skin turgor is good  Neuro: follows commands appropriately  Psych:  Good insight, oriented to person, place and time, alert      Medications Reviewed: (see below)    Lab Data Reviewed: (see below)    ______________________________________________________________________    Medications:     Current Facility-Administered Medications   Medication Dose Route Frequency    glucose chewable tablet 16 g  4 tablet Oral PRN    dextrose  bolus 10% 125 mL  125  mL IntraVENous PRN    Or    dextrose  bolus 10% 250 mL  250 mL IntraVENous PRN    glucagon  injection 1 mg  1 mg SubCUTAneous PRN    dextrose  10 % infusion   IntraVENous Continuous PRN    insulin  lispro (HUMALOG ,ADMELOG ) injection vial 0-8 Units  0-8 Units SubCUTAneous 4x Daily AC & HS    ampicillin -sulbactam (UNASYN ) 3,000 mg in sodium chloride  0.9 % 100 mL IVPB (addEASE)  3,000 mg IntraVENous Q6H    iopamidol  (ISOVUE -370) 76 % injection 100 mL  100 mL IntraVENous ONCE PRN    methylPREDNISolone  sodium succ (SOLU-MEDROL ) 40 mg in sterile water  1 mL injection  40 mg IntraVENous Q8H    psyllium husk-aspartame (METAMUCIL FIBER) packet 1 packet  1 packet Oral BID    polyethylene glycol (GLYCOLAX ) packet 17 g  17 g Oral Daily    QUEtiapine  (SEROQUEL ) tablet 25 mg  25 mg Oral Q6H PRN    ipratropium 0.5 mg-albuterol  2.5 mg (DUONEB ) nebulizer solution 1 Dose  1 Dose Inhalation Q4H PRN    prochlorperazine  (COMPAZINE ) injection 10 mg  10 mg IntraVENous Q6H PRN    sodium chloride  flush 0.9 % injection 5-40 mL  5-40 mL IntraVENous 2 times per day    sodium chloride  flush 0.9 % injection 5-40 mL  5-40 mL IntraVENous PRN    0.9 % sodium chloride  infusion   IntraVENous PRN    potassium chloride  (KLOR-CON ) extended release tablet 40 mEq  40 mEq Oral PRN    Or    potassium bicarb-citric acid  (EFFER-K ) effervescent tablet 40 mEq  40 mEq Oral PRN    Or    potassium chloride  10 mEq/100 mL IVPB (Peripheral Line)  10 mEq IntraVENous PRN    magnesium  sulfate 2000 mg in 50 mL IVPB premix  2,000 mg IntraVENous PRN    enoxaparin  Sodium (LOVENOX ) injection 30 mg  30 mg SubCUTAneous Daily    acetaminophen  (TYLENOL ) tablet 650 mg  650 mg Oral Q6H PRN    Or    acetaminophen  (TYLENOL ) suppository 650 mg  650 mg Rectal Q6H PRN    melatonin tablet 6 mg  6 mg Oral Nightly PRN    bisacodyl  (DULCOLAX) EC tablet 5 mg  5 mg Oral Daily PRN    azithromycin  (ZITHROMAX ) 500 mg in sodium chloride  0.9 % 250 mL IVPB (Vial2Bag)  500 mg IntraVENous Q24H     atorvastatin  (LIPITOR ) tablet 10 mg  10 mg Oral Daily    carvedilol  (COREG ) tablet 3.125 mg  3.125 mg Oral BID    trospium  (SANCTURA ) tablet 20 mg  20 mg Oral BID AC    losartan  (COZAAR ) tablet 100 mg  100 mg Oral Daily    albuterol  (PROVENTIL ) (2.5 MG/3ML) 0.083% nebulizer solution 2.5 mg  2.5 mg Nebulization Q4H PRN    benzonatate  (TESSALON ) capsule 100 mg  100 mg Oral TID PRN          Lab Review:     Recent Labs     10/11/24  0602 10/12/24  0351 10/13/24  0417   WBC 11.2* 13.7* 17.9*   HGB 9.5* 10.5* 11.7   HCT 30.3* 33.1* 35.8   PLT 304 346 394     Recent Labs     10/10/24  2151 10/11/24  0602 10/11/24  1744 10/12/24  0351   NA 135* 135*  --  133*   K 3.9 3.6  --  3.7   CL 101 106  --  100   CO2 23 20  --  25   BUN 16 12  --  8   MG  --  1.2* 2.2  --    ALT 8*  --   --   --      No results found for: GLUCPOC       Assessment / Plan:     Chelsea Schultz is a 89 y.o. female with DM2, hypertension, overactive bladder, and recurrent UTIs.  She was admitted to this hospital on 10/10/2024 for evaluation/management of acute hypoxic respiratory failure thought to be secondary to pulmonary fibrosis or community-acquired pneumonia.     #Acute hypoxemic respiratory failure  #Pulmonary fibrosis  #Community-acquired pneumonia: Reason for admission.  CT chest from admission shows diffuse interstitial prominence and ground glass opacities suspicious for pulmonary fibrosis and pneumonitis versus pneumonia.  BNP normal for age, procalcitonin not elevated.  Not clinically fluid overloaded on exam.  Lower suspicion for PE.  COVID and flu negative.  RVP negative.  Patient was never a  smoker.  No known occupational exposures per daughter.  - Continue empiric Unasyn  and azithromycin   - Continue scheduled nebulized bronchodilators  - Hold off on steroids for now  - Pulm consulted, appreciate recommendations  - CTA of the chest evaluation of interval change  -Wean oxygen as able     #UTI: POA.  Acute.  Culture and antibiotics as  below     Sepsis secondary to pneumonia and UTI: Met criteria on admission with elevated WBC (stable) and elevated heart rate (improved) with suspected pneumonia and UTI.  Procalcitonin not elevated.  - Urine growing Enterococcus sensitive to ampicillin   -Change antibiotics to azithromycin  and Unasyn   -Blood cultures collected on admission     #Delirium  -Soft wrist restraints  -Delirium precautions as able     #Normocytic anemia: Not present on admission.  Suspect she has chronic anemia that was masked by hemoconcentration on admission.  Improved now  -We can stop monitoring     #Hyponatremia: POA, stable.     #Hypercalcemia: POA, improved.  Presumably from dehydration.     #Hypomagnesemia  -scheduled magnesium  x 3 days     #DM2 with hyperglycemia  -Hold home metformin   -sliding scale     #Overactive bladder  -Continue home meds     #Hypertension  -Continue home carvedilol  and losartan   -Hold home amlodipine      #Hyperlipidemia  -Continue home atorvastatin      #Dementia: Mild/moderate per daughter.    In caring for this patient today, I reviewed recent notes, interpreted lab results and imaging tests, had a face-to-face encounter with the patient, performing the medically necessary appropriate exam and history, counseled the patient/family/caregiver, ordered medications, tests and procedures as indicated for diagnostic and therapeutic purposes, consulted and communicated with other healthcare professionals including care coordination and discharge planning, and documented clinical information in the electronic health record.                 Care Plan discussed with: Patient, Care Manager, and Nursing Staff    Discussed:  Care Plan and D/C Planning    Prophylaxis:  Lovenox     Disposition:  To be determined           ___________________________________________________    Attending Physician: Othella Crock, MD       "

## 2024-10-13 NOTE — Progress Notes (Addendum)
"  RRT called @ [phone] for desat on tele. O2 had been removed by pt,  NC reapplied, sats improved. Pt repositioned in bed.  "

## 2024-10-13 NOTE — Consults (Signed)
 "[facility]     Name: BERNARDINE LANGWORTHY MRN: [id number]   DOB: 10/25/1925 Hospital: [facility]   Date: 10/13/2024        Impression Plan   Acute respiratory failure  Hypoxia  Bilateral PNA  Hypoxia  ILD exacerbation  Pulmonary fibrosis- UIP  GERD  Leukocytosis  HxofCVA               Continue to wean O2, keeping sats above 90%  Agree with Unasyn  and  azithro  Add low dose IV steroids  RVP negative   BP control  OOB as soon as pt more awake  Prognosis guarded  Discussed plan with daughter at bedside           Radiology  ( personally reviewed) CTA chest 10/11/24: Bilateral pulmonary fibrosis in UIP pattern with superimposed  GG infiltrates   ABG Invalid input(s): PHI, PO2I, PCO2I       Subjective     Cc: abnormal CT chest    89 yo with PMHx of dementia, CVA, spinal stenosis, chronic debilitation and pulmonary fibrosis who presented to the ER on 12/19 with increased somnolence and  weakness. Pt was found to be hypoxic. CT chest revealed bilateral GG infiltrates superimposed on known pulmonary fibrosis. Pt seen at [facility] in 2017 with plan for PFTs and repeat CT chest, but did not return for follow up. Pt is altered. Wakes up and answers some questions. Daughter denies any hx of GERD/episodes of vomiting. Denies any witnessed choking or aspiration episodes. Never smoker.     Review of Systems:  Review of systems not obtained due to patient factors.    Past Medical History:   Diagnosis Date    Arthritis     CVA (cerebral infarction)     Diabetes (HCC) 01/09/2015    Hemoglobin A1C 6.4 (per records, done at outside facility)    GERD (gastroesophageal reflux disease)     History of recurrent UTIs     on chronic antibiotic prophylaxis, followed by urology    HLD (hyperlipidemia) 01/09/2015    Total Chol 141, Tri 49, HDL 59, LDL 72    Hypertension     Neuropathy     Osteopetrosis     Spinal stenosis       Past Surgical History:   Procedure Laterality Date    ORTHOPEDIC SURGERY      Lumbar  laminectomy    ORTHOPEDIC SURGERY      Lumbar fusion      Prior to Admission medications   Medication Sig Start Date End Date Taking? Authorizing Provider   pregabalin  (LYRICA ) 100 MG capsule TAKE 1 CAPSULE BY MOUTH DAILY AS NEEDED FOR NERVE PAIN  Patient taking differently: Take 1 capsule by mouth daily. TAKE 1 CAPSULE BY MOUTH DAILY FOR NERVE PAIN 09/16/24 12/17/24 Yes Uhlar, Adrianna K, MD   amLODIPine  (NORVASC ) 10 MG tablet TAKE 1 TABLET(10 MG) BY MOUTH EVERY NIGHT  Patient taking differently: Take 1 tablet by mouth daily TAKE 1 TABLET(10 MG) BY MOUTH EVERY NIGHT 07/17/24  Yes Ledwith, Alexandra G, MD   mirabegron  (MYRBETRIQ ) 50 MG TB24 Take 50 mg by mouth daily 06/26/23  Yes Ledwith, Alexandra G, MD   metFORMIN  (GLUCOPHAGE ) 500 MG tablet TAKE 1 TABLET BY MOUTH TWICE DAILY WITH A MEAL 09/02/24   Ledwith, Alexandra G, MD   famotidine  (PEPCID ) 40 MG tablet TAKE 1/2 TABLET BY MOUTH EVERY MORNING AND 1/2 TABLET AT BEDTIME 08/21/24   Ledwith, Alexandra G,  MD   losartan  (COZAAR ) 100 MG tablet TAKE 1 TABLET BY MOUTH DAILY 08/21/24 08/16/25  Ledwith, Alexandra G, MD   Vaginal Lubricant (VAGISIL LUBRICANT) GEL Place vaginally    [provider]   vitamin B-12 (CYANOCOBALAMIN) 1000 MCG tablet     [provider]   Cholecalciferol (VITAMIN D-3 PO) Take by mouth    [provider]   polyethylene glycol (GLYCOLAX ) 17 GM/SCOOP powder Take 17 g by mouth daily as needed (constipation) 08/01/24   Guadelupe Mace POUR, MD   atorvastatin  (LIPITOR ) 10 MG tablet Take 1 tablet by mouth daily 08/01/24   Uhlar, Adrianna K, MD   carvedilol  (COREG ) 3.125 MG tablet Take 1 tablet by mouth 2 times daily 08/01/24   Uhlar, Adrianna K, MD   Diabetic Shoe MISC by Does not apply route  Patient not taking: Reported on 08/19/2024 08/01/24   Guadelupe Mace POUR, MD   METAMUCIL FIBER PO Take by mouth    [provider]   trospium  (SANCTURA ) 20 MG tablet Take 1 tablet by mouth daily    [provider]    acetaminophen  (TYLENOL ) 650 MG extended release tablet Take 1 tablet by mouth daily as needed    Automatic Reconciliation, Ar   ascorbic acid (VITAMIN C) 1000 MG tablet Take 1 tablet by mouth daily    Automatic Reconciliation, Ar   aspirin 81 MG EC tablet ceived the following from [facility]: Outside name: aspirin delayed-release 81 mg tablet 10/31/21   Automatic Reconciliation, Ar   diclofenac sodium (VOLTAREN) 1 % GEL Apply 2 g topically 4 times daily    Automatic Reconciliation, Ar   docusate (COLACE, DULCOLAX) 100 MG CAPS Take 100 mg by mouth daily  Patient not taking: Reported on 08/19/2024    Automatic Reconciliation, Ar   Estradiol (VAGIFEM) 10 MCG TABS vaginal tablet Place 1 tablet vaginally  Patient not taking: Reported on 08/19/2024 12/01/21 12/01/22  Automatic Reconciliation, Ar     @CMEDSCH @  Allergies   Allergen Reactions    Nitrofurantoin Rash     Was placed on it 09/2019 and pt noted rash on her hand immediately after starting antibx. Will add it back to her allergy list.      Social History     Tobacco Use    Smoking status: Never    Smokeless tobacco: Never   Substance Use Topics    Alcohol use: No      Family History   Problem Relation Age of Onset    Stroke Father     Cancer Sister     Hypertension Father     Hypertension Mother     Stroke Mother     Diabetes Mother     Stroke Sister           Laboratory: I have personally reviewed the critical care flowsheet and labs.     Recent Labs     10/11/24  0602 10/12/24  0351 10/13/24  0417   WBC 11.2* 13.7* 17.9*   HGB 9.5* 10.5* 11.7   HCT 30.3* 33.1* 35.8   PLT 304 346 394     Recent Labs     10/10/24  2151 10/11/24  0602 10/11/24  1744 10/12/24  0351   NA 135* 135*  --  133*   K 3.9 3.6  --  3.7   CL 101 106  --  100   CO2 23 20  --  25   BUN 16 12  --  8   MG  --  1.2* 2.2  --    ALT 8*  --   --   --        Objective:     Mode Rate Tidal Volume Pressure FiO2 PEEP                    Vital Signs:     TMAX(24)      Intake/Output:   Last  shift:         Last 3 shifts: 12/22 0701 - 12/22 1900  In: -   Out: 250 [Urine:250]RRIOLAST3  Intake/Output Summary (Last 24 hours) at 10/13/2024 1348  Last data filed at 10/13/2024 1022  Gross per 24 hour   Intake --   Output 2000 ml   Net -2000 ml     EXAM:   GENERAL: sleepy, answers some questions, HEENT:  PERRL, EOMI, no alar flaring or epistaxis, oral mucosa moist without cyanosis, NECK:  no jugular vein distention, no retractions, no thyromegaly or masses, LUNGS: CTA, no w/r/r HEART:  Regular rate and rhythm with no MGR; no edema is present, ABDOMEN:  soft with no tenderness, EXTREMITIES:  warm with no cyanosis, SKIN:  no jaundice or ecchymosis, and NEUROLOGIC:  alert and oriented, grossly non-focal    Ted Santos, MD  [facility]        "

## 2024-10-13 NOTE — Progress Notes (Addendum)
"  0700 Verbal and bedside shift change nurse handoff report given to Monique Hefty RN from [employer]. Restraint criteria no longer met, restraints discontinued.    9072 RRT called for oxygen desat by telemetry. O2 had been removed by pt, NC reapplied at 5L, sats improved. Pt repositioned in bed.    1022 Notified by telemetry, pt had burst of SVT into 140s for 7 seconds. MD notified. No new orders.    1942 Verbal shift change nurse handoff report given to Writer from [employer].   "

## 2024-10-13 NOTE — Care Coordination (Signed)
"  Care Management Progress Note    Reason for Admission:   Hypoxemia [R09.02]  Severe sepsis (HCC) [A41.9, R65.20]  Community acquired pneumonia, unspecified laterality [J18.9]         Patient Admission Status: Inpatient  RUR:   Hospitalization in the last 30 days (Readmission):  No        Transition of care plan:  Medical - weaning O2, CTA chest pending  DC plan - Recommendation for SNF at dc, per previous CM note [facility] would be choice for SNF. CM sent referral via [facility], acceptance pending.   If SNF or IPR:  Date FOC offered:   Accepting facility:   Date authorization started with reference number:   Date authorization received and expires:   Discharge plan communicated with patient and/or discharge caregiver: Yes, spoke with one  daughter at bedside, awaiting CB from daughter [name] with whom the patient resides.   Date 1st IMM letter given:   Outpatient follow-up - per medical team  Transport at discharge:  TBD  "

## 2024-10-13 NOTE — Plan of Care (Signed)
 "  Problem: Physical Therapy - Adult  Goal: By Discharge: Performs mobility at highest level of function for planned discharge setting.  See evaluation for individualized goals.  Description: FUNCTIONAL STATUS PRIOR TO ADMISSION: Patient required 1 person assistance for stand pivot transfers from bed <> wheelchair and was non-ambulatory. Stair lift inside home with upstairs bedroom. Ramped entry to home. Requires assistance for most ADLs.    HOME SUPPORT PRIOR TO ADMISSION: The patient lives with daughter and has daily caregiver assistance while daughter is working.    Physical Therapy Goals  Initiated 10/12/2024  1.  Patient will move from supine to sit and sit to supine, scoot up and down, and roll side to side in bed with minimal assistance within 7 day(s).    2.  Patient will perform sit to stand with minimal assistance within 7 day(s).  3.  Patient will sit on EOB with supervision x 5 minutes within 7 day(s).  4.  Patient will transfer from bed to chair and chair to bed with minimal assistance using the least restrictive device within 7 day(s).  PHYSICAL THERAPY TREATMENT    Patient: Chelsea Schultz (89 y.o. female)  Date: 10/13/2024  Diagnosis: Hypoxemia [R09.02]  Severe sepsis (HCC) [A41.9, R65.20]  Community acquired pneumonia, unspecified laterality [J18.9] Severe sepsis (HCC)      Precautions:              ASSESSMENT:  Patient continues to benefit from skilled PT services and is slowly progressing towards goals. Pt tolerated session fairly and was limited by decreased level of arousal and ability to maintain alertness. Pt received supine in bed on 5L of oxygen, sleeping soundly. Pt required max A to repositioning, scoot, and roll in bed to promote midline position and posture. Placed towel roll on right side of neck to prevent right side bending while sleeping. Pt would open eyes briefly to voice/tactile stimulation, but then would fall back asleep immediately. Pt will continue to benefit from PT to  progress mobility as tolerated and able, as well as to prevent further deconditioning.     PLAN:  Patient continues to benefit from skilled intervention to address the above impairments.  Continue treatment per established plan of care.      Recommendations for mobility with staff: Recommend that staff assists the patient to meet their mobility and care needs at the bed level until progress has been made with therapy.    Recommendations for toileting with staff:  recommended toilet device: a bed pan and an external urinary catheter.       Recommendation for discharge: (in order for the patient to meet his/her long term goals):   Moderate intensity short-term skilled physical therapy up to 5x/week    Other factors to consider for discharge: patient's current support system is unable to meet their requirements for physical assistance, poor safety awareness, impaired cognition, high risk for falls, not safe to be alone, and concern for safely navigating or managing the home environment    IF patient discharges home will need the following DME: continuing to assess with progress       SUBJECTIVE:   Patient stated, No.  When asked if patient was hungry  RN reported restraints had been removed from overnight.    OBJECTIVE DATA SUMMARY:   Critical Behavior:  Orientation  Overall Orientation Status: Impaired  Orientation Level: Oriented to person       Functional Mobility Training:  Bed Mobility:  Bed Mobility Training  Rolling: Substantial/Maximal  assistance  Scooting: Substantial/Maximal assistance  Transfers:     Balance:  Balance  Sitting: Impaired;With support   Ambulation/Gait Training:              Neuro Re-Education:                    Pain Rating:  Pt did not appear to be in pain and no indication of pain during session   Pain Intervention(s):       Activity Tolerance:   Fair  and Poor  Remain on 5L of oxygen. SpO2: > 90%    After treatment:   Patient left in no apparent distress in bed, Call bell within reach,  Bed/ chair alarm activated, and Side rails x3      COMMUNICATION/EDUCATION:   The patient's plan of care was discussed with: registered nurse    Patient Education  Education Given To: Patient  Education Provided: Role of Therapy;Plan of Care;Mobility Training  Education Method: Verbal  Barriers to Learning: Cognition  Education Outcome: Continued education needed      Rosina JULIANNA Pellet, PT,DPT  Minutes: 10         "

## 2024-10-14 ENCOUNTER — Inpatient Hospital Stay: Admit: 2024-10-14 | Payer: MEDICARE

## 2024-10-14 LAB — CBC
Hematocrit: 33.2 % — ABNORMAL LOW (ref 35.0–47.0)
Hemoglobin: 10.6 g/dL — ABNORMAL LOW (ref 11.5–16.0)
MCH: 27 pg (ref 26.0–34.0)
MCHC: 31.9 g/dL (ref 30.0–36.5)
MCV: 84.7 FL (ref 80.0–99.0)
MPV: 10.1 FL (ref 8.9–12.9)
Nucleated RBCs: 0 /100{WBCs}
Platelets: 399 K/uL (ref 150–400)
RBC: 3.92 M/uL (ref 3.80–5.20)
RDW: 12.5 % (ref 11.5–14.5)
WBC: 10.2 K/uL (ref 3.6–11.0)
nRBC: 0 K/uL (ref 0.00–0.01)

## 2024-10-14 LAB — POCT GLUCOSE
POC Glucose: 212 mg/dL — ABNORMAL HIGH (ref 65–117)
POC Glucose: 198 mg/dL — ABNORMAL HIGH (ref 65–117)
POC Glucose: 177 mg/dL — ABNORMAL HIGH (ref 65–117)
POC Glucose: 269 mg/dL — ABNORMAL HIGH (ref 65–117)

## 2024-10-14 LAB — HEMOGLOBIN A1C
Estimated Avg Glucose: 153 mg/dL
Hemoglobin A1C: 7 % — ABNORMAL HIGH (ref 4.0–5.6)

## 2024-10-14 MED FILL — METHYLPREDNISOLONE SODIUM SUCC 40 MG IJ SOLR: 40 mg | INTRAMUSCULAR | Qty: 40 | Fill #0

## 2024-10-14 MED FILL — AMPICILLIN-SULBACTAM SODIUM 3 (2-1) G IJ SOLR: 3 (2-1) g | INTRAMUSCULAR | Qty: 3000 | Fill #0

## 2024-10-14 MED FILL — INSULIN LISPRO 100 UNIT/ML IJ SOLN: 100 [IU]/mL | INTRAMUSCULAR | Qty: 2 | Fill #0

## 2024-10-14 MED FILL — INSULIN LISPRO 100 UNIT/ML IJ SOLN: 100 [IU]/mL | INTRAMUSCULAR | Qty: 4 | Fill #0

## 2024-10-14 NOTE — Plan of Care (Signed)
 "  Problem: Physical Therapy - Adult  Goal: By Discharge: Performs mobility at highest level of function for planned discharge setting.  See evaluation for individualized goals.  Description: FUNCTIONAL STATUS PRIOR TO ADMISSION: Patient required 1 person assistance for stand pivot transfers from bed <> wheelchair and was non-ambulatory. Stair lift inside home with upstairs bedroom. Ramped entry to home. Requires assistance for most ADLs.    HOME SUPPORT PRIOR TO ADMISSION: The patient lives with daughter and has daily caregiver assistance while daughter is working.    Physical Therapy Goals  Initiated 10/12/2024  1.  Patient will move from supine to sit and sit to supine, scoot up and down, and roll side to side in bed with minimal assistance within 7 day(s).    2.  Patient will perform sit to stand with minimal assistance within 7 day(s).  3.  Patient will sit on EOB with supervision x 5 minutes within 7 day(s).  4.  Patient will transfer from bed to chair and chair to bed with minimal assistance using the least restrictive device within 7 day(s).    Outcome: Progressing   PHYSICAL THERAPY TREATMENT    Patient: Chelsea Schultz (89 y.o. female)  Date: 10/14/2024  Diagnosis: Hypoxemia [R09.02]  Severe sepsis (HCC) [A41.9, R65.20]  Community acquired pneumonia, unspecified laterality [J18.9] Severe sepsis (HCC)      Precautions:              ASSESSMENT:  Patient continues to benefit from skilled PT services and is slowly progressing towards goals. Pt using 4L supplemental O2 throughout session. Pt debilitated and requires increased time and assistance for be mobility. Pt short stature use of step stool for support under foot. Occasional support with static sitting to maintain midline. increased fear of falling decreased fwd lean  requiring HHA x 2 for sit<>stand x 2    Fatigues quickly and assisted back to bed at end of session.         PLAN:  Patient continues to benefit from skilled intervention to address the  above impairments.  Continue treatment per established plan of care.      Recommendations for mobility with staff: Recommend that staff completes patient mobility with assist x2 using Camie Ip.    Recommendations for toileting with staff:  recommended toilet device: a bedside commode.     Recommend for next PT session: sit to stand transfers, bed to bedside chair transfers, and further progression of gait with existing device    Recommendation for discharge: (in order for the patient to meet his/her long term goals):   Moderate intensity short-term skilled physical therapy up to 5x/week    Other factors to consider for discharge: impaired cognition, high risk for falls, and concern for safely navigating or managing the home environment    IF patient discharges home will need the following DME: continuing to assess with progress       SUBJECTIVE:   Patient stated, .    OBJECTIVE DATA SUMMARY:   Critical Behavior:  Orientation  Overall Orientation Status: Impaired  Orientation Level: Oriented to person  Cognition  Overall Cognitive Status: Exceptions  Arousal/Alertness: Delayed responses to stimuli  Following Commands: Follows one step commands with repetition;Impaired  Attention Span: Impaired;Difficulty attending to directions  Memory: Impaired  Safety Judgement: Impaired    Functional Mobility Training:  Bed Mobility:  Bed Mobility Training  Rolling: Partial/Moderate assistance  Sit to Supine: 2 Person assistance;Partial/Moderate assistance  Scooting: Minimal assistance;2 Person assistance  Transfers:  Transfer Training  Interventions: Safety awareness training;Tactile cues;Verbal cues  Sit to Stand: 2 Person assistance;Partial/Moderate assistance  Stand to Sit: Partial/Moderate assistance;2 Person assistance  Balance:  Balance  Sitting: Impaired;With support  Sitting - Static: Fair (occasional)  Sitting - Dynamic: Fair (occasional);Poor (constant support)  Standing: Impaired  Standing - Static: Constant  support;Fair;Poor   Ambulation/Gait Training:              Neuro Re-Education:                    Pain Rating:  -/10   Pain Intervention(s):       Activity Tolerance:   requires rest breaks    After treatment:   Patient left in no apparent distress in bed, Call bell within reach, and Bed/ chair alarm activated      COMMUNICATION/EDUCATION:   The patient's plan of care was discussed with: registered nurse           Corean CHRISTELLA [place], PTA  Minutes: 23   "

## 2024-10-14 NOTE — Progress Notes (Signed)
 "[facility]  [address] [place], [place] [address]  [phone]        Hospitalist Progress Note      NAME: Chelsea Schultz   DOB:  08-03-1926  MRM:  [id number]    Date/Time of service: 10/14/2024  12:14 PM       Subjective:     Patient was personally seen and examined by me during this time period.  Chart reviewed.    Follow up pneumonia, respiratory failure, UTI, possible pulmonary fibrosis.    Patient reports: Refusing to answer questions this morning and pretending to be asleep       Objective:       Vitals:       Last 24hrs VS reviewed since prior progress note. Most recent are:    Vitals:    10/14/24 0750   BP: (!) 142/78   Pulse: 92   Resp: 18   Temp: 97.5 F (36.4 C)   SpO2: 91%     SpO2 Readings from Last 6 Encounters:   10/14/24 91%   08/19/24 96%   06/26/23 97%   09/05/22 96%   06/19/22 96%   05/11/22 94%          Intake/Output Summary (Last 24 hours) at 10/14/2024 1214  Last data filed at 10/14/2024 9361  Gross per 24 hour   Intake --   Output 750 ml   Net -750 ml        Exam:     Physical Exam:    Gen:  Well-developed, well-nourished, in no acute distress  HEENT:  Pink conjunctivae, EOMI, hearing intact to voice, moist mucous membranes  Resp:  No accessory muscle use, clear breath sounds without wheezes rales or rhonchi  Card:  No murmurs, normal S1, S2 without thrills, bruits or peripheral edema  Abd:  Soft, non-tender, non-distended, no palpable organomegaly and no detectable hernias  Musc:  No cyanosis or clubbing  Skin:  No rashes or ulcers, skin turgor is good  Neuro: follows commands appropriately  Psych:  poor insight.  Orientation level unclear.  Alert    Medications Reviewed: (see below)    Lab Data Reviewed: (see below)    ______________________________________________________________________    Medications:     Current Facility-Administered Medications   Medication Dose Route Frequency    glucose chewable tablet 16 g  4 tablet Oral PRN    dextrose  bolus  10% 125 mL  125 mL IntraVENous PRN    Or    dextrose  bolus 10% 250 mL  250 mL IntraVENous PRN    glucagon  injection 1 mg  1 mg SubCUTAneous PRN    dextrose  10 % infusion   IntraVENous Continuous PRN    insulin  lispro (HUMALOG ,ADMELOG ) injection vial 0-8 Units  0-8 Units SubCUTAneous 4x Daily AC & HS    ampicillin -sulbactam (UNASYN ) 3,000 mg in sodium chloride  0.9 % 100 mL IVPB (addEASE)  3,000 mg IntraVENous Q6H    methylPREDNISolone  sodium succ (SOLU-MEDROL ) 40 mg in sterile water  1 mL injection  40 mg IntraVENous Q8H    psyllium husk-aspartame (METAMUCIL FIBER) packet 1 packet  1 packet Oral BID    polyethylene glycol (GLYCOLAX ) packet 17 g  17 g Oral Daily    QUEtiapine  (SEROQUEL ) tablet 25 mg  25 mg Oral Q6H PRN    ipratropium 0.5 mg-albuterol  2.5 mg (DUONEB ) nebulizer solution 1 Dose  1 Dose Inhalation Q4H PRN    prochlorperazine  (COMPAZINE ) injection 10 mg  10  mg IntraVENous Q6H PRN    sodium chloride  flush 0.9 % injection 5-40 mL  5-40 mL IntraVENous 2 times per day    sodium chloride  flush 0.9 % injection 5-40 mL  5-40 mL IntraVENous PRN    0.9 % sodium chloride  infusion   IntraVENous PRN    potassium chloride  (KLOR-CON ) extended release tablet 40 mEq  40 mEq Oral PRN    Or    potassium bicarb-citric acid  (EFFER-K) effervescent tablet 40 mEq  40 mEq Oral PRN    Or    potassium chloride  10 mEq/100 mL IVPB (Peripheral Line)  10 mEq IntraVENous PRN    magnesium  sulfate 2000 mg in 50 mL IVPB premix  2,000 mg IntraVENous PRN    enoxaparin  Sodium (LOVENOX ) injection 30 mg  30 mg SubCUTAneous Daily    acetaminophen  (TYLENOL ) tablet 650 mg  650 mg Oral Q6H PRN    Or    acetaminophen  (TYLENOL ) suppository 650 mg  650 mg Rectal Q6H PRN    melatonin tablet 6 mg  6 mg Oral Nightly PRN    bisacodyl  (DULCOLAX) EC tablet 5 mg  5 mg Oral Daily PRN    azithromycin  (ZITHROMAX ) 500 mg in sodium chloride  0.9 % 250 mL IVPB (Vial2Bag)  500 mg IntraVENous Q24H    atorvastatin  (LIPITOR ) tablet 10 mg  10 mg Oral Daily    carvedilol   (COREG ) tablet 3.125 mg  3.125 mg Oral BID    trospium  (SANCTURA ) tablet 20 mg  20 mg Oral BID AC    losartan  (COZAAR ) tablet 100 mg  100 mg Oral Daily    albuterol  (PROVENTIL ) (2.5 MG/3ML) 0.083% nebulizer solution 2.5 mg  2.5 mg Nebulization Q4H PRN    benzonatate  (TESSALON ) capsule 100 mg  100 mg Oral TID PRN          Lab Review:     Recent Labs     10/12/24  0351 10/13/24  0417 10/14/24  0512   WBC 13.7* 17.9* 10.2   HGB 10.5* 11.7 10.6*   HCT 33.1* 35.8 33.2*   PLT 346 394 399     Recent Labs     10/11/24  1744 10/12/24  0351   NA  --  133*   K  --  3.7   CL  --  100   CO2  --  25   BUN  --  8   MG 2.2  --      No results found for: GLUCPOC       Assessment / Plan:     Chelsea Schultz is a 89 y.o. female with DM2, hypertension, overactive bladder, and recurrent UTIs.  She was admitted to this hospital on 10/10/2024 for evaluation/management of acute hypoxic respiratory failure thought to be secondary to pulmonary fibrosis or community-acquired pneumonia.     #Acute hypoxemic respiratory failure  #Pulmonary fibrosis  #Community-acquired pneumonia: Reason for admission.  CT chest from admission shows diffuse interstitial prominence and ground glass opacities suspicious for pulmonary fibrosis and pneumonitis versus pneumonia.  BNP normal for age, procalcitonin not elevated.  Not clinically fluid overloaded on exam.  Lower suspicion for PE.  COVID and flu negative.  RVP negative.  Patient was never a smoker.  No known occupational exposures per daughter.  CTA of the chest showed no interval change  - Continue empiric Unasyn  and azithromycin   - Continue scheduled nebulized bronchodilators  - Continue steroids  - Pulm consulted, appreciate recommendations  - Wean oxygen as able     #  UTI: POA.  Acute.  Culture and antibiotics as below     Sepsis secondary to pneumonia and UTI: Met criteria on admission with elevated WBC (stable) and elevated heart rate (improved) with suspected pneumonia and UTI.  Procalcitonin  not elevated.  - Urine growing Enterococcus sensitive to ampicillin   - Continue azithromycin  and Unasyn   - Blood cultures have no growth     #Delirium  - Restraints as needed  - Delirium precautions as able     #Normocytic anemia: Not present on admission.  Suspect she has chronic anemia that was masked by hemoconcentration on admission.  Improved now  - We can stop monitoring     #Hyponatremia: POA, stable.     #Hypercalcemia: POA, improved.  Presumably from dehydration.     #Hypomagnesemia  - scheduled magnesium  x 3 days     #DM2 with hyperglycemia  - Hold home metformin   - sliding scale     #Overactive bladder  - Continue home meds     #Hypertension  - Continue home carvedilol  and losartan   - Hold home amlodipine      #Hyperlipidemia  - Continue home atorvastatin      #Dementia: Mild/moderate per daughter.    In caring for this patient today, I reviewed recent notes, interpreted lab results and imaging tests, had a face-to-face encounter with the patient, performing the medically necessary appropriate exam and history, counseled the patient/family/caregiver, ordered medications, tests and procedures as indicated for diagnostic and therapeutic purposes, consulted and communicated with other healthcare professionals including care coordination and discharge planning, and documented clinical information in the electronic health record.                 Care Plan discussed with: Patient, Care Manager, and Nursing Staff    Discussed:  Care Plan and D/C Planning    Prophylaxis:  Lovenox     Disposition:  To be determined           ___________________________________________________    Attending Physician: Othella Crock, MD       "

## 2024-10-14 NOTE — Progress Notes (Signed)
 "[facility]     Name: Chelsea Schultz MRN: [id number]   DOB: [birth year] Hospital: [facility]   Date: 10/14/2024        Impression Plan   Acute respiratory failure  Hypoxia  Bilateral PNA  Hypoxia  ILD exacerbation  Pulmonary fibrosis- UIP  GERD  Leukocytosis  HxofCVA               Continue to wean O2, keeping sats above 90%  Agree with Unasyn  and  azithro  Continue IVCS  RVP negative   BP control  OOB as soon as pt more awake, attempt to have her sitting in high Fowlers  Prognosis guarded             Radiology  ( personally reviewed) CTA chest 10/11/24: Bilateral pulmonary fibrosis in UIP pattern with superimposed  GG infiltrates   ABG Invalid input(s): PHI, PO2I, PCO2I       Subjective     Cc: abnormal CT chest    89 yo with PMHx of dementia, CVA, spinal stenosis, chronic debilitation and pulmonary fibrosis who presented to the ER on 12/19 with increased somnolence and  weakness. Pt was found to be hypoxic. CT chest revealed bilateral GG infiltrates superimposed on known pulmonary fibrosis. Pt seen at [facility] in 2017 with plan for PFTs and repeat CT chest, but did not return for follow up. Pt is altered. Wakes up and answers some questions. Daughter denies any hx of GERD/episodes of vomiting. Denies any witnessed choking or aspiration episodes. Never smoker.     12/23: Arousable to voice, able to state name and open eyes.  On 3.5 L, satting 91%. No leukocytosis on a.m. labs.    Review of Systems:  Review of systems not obtained due to patient factors.    Past Medical History:   Diagnosis Date    Arthritis     CVA (cerebral infarction)     Diabetes (HCC) 01/09/2015    Hemoglobin A1C 6.4 (per records, done at outside facility)    GERD (gastroesophageal reflux disease)     History of recurrent UTIs     on chronic antibiotic prophylaxis, followed by urology    HLD (hyperlipidemia) 01/09/2015    Total Chol 141, Tri 49, HDL 59, LDL 72    Hypertension     Neuropathy     Osteopetrosis      Spinal stenosis       Past Surgical History:   Procedure Laterality Date    ORTHOPEDIC SURGERY      Lumbar laminectomy    ORTHOPEDIC SURGERY      Lumbar fusion      Prior to Admission medications   Medication Sig Start Date End Date Taking? Authorizing Provider   pregabalin  (LYRICA ) 100 MG capsule TAKE 1 CAPSULE BY MOUTH DAILY AS NEEDED FOR NERVE PAIN  Patient taking differently: Take 1 capsule by mouth daily. TAKE 1 CAPSULE BY MOUTH DAILY FOR NERVE PAIN 09/16/24 12/17/24 Yes Uhlar, Adrianna K, MD   amLODIPine  (NORVASC ) 10 MG tablet TAKE 1 TABLET(10 MG) BY MOUTH EVERY NIGHT  Patient taking differently: Take 1 tablet by mouth daily TAKE 1 TABLET(10 MG) BY MOUTH EVERY NIGHT 07/17/24  Yes Ledwith, Alexandra G, MD   mirabegron  (MYRBETRIQ ) 50 MG TB24 Take 50 mg by mouth daily 06/26/23  Yes Ledwith, Alexandra G, MD   metFORMIN  (GLUCOPHAGE ) 500 MG tablet TAKE 1 TABLET BY MOUTH TWICE DAILY WITH A MEAL 09/02/24   Ledwith, Alexandra G, MD  famotidine  (PEPCID ) 40 MG tablet TAKE 1/2 TABLET BY MOUTH EVERY MORNING AND 1/2 TABLET AT BEDTIME 08/21/24   Ledwith, Alexandra G, MD   losartan  (COZAAR ) 100 MG tablet TAKE 1 TABLET BY MOUTH DAILY 08/21/24 08/16/25  Ledwith, Alexandra G, MD   Vaginal Lubricant (VAGISIL LUBRICANT) GEL Place vaginally    [provider]   vitamin B-12 (CYANOCOBALAMIN) 1000 MCG tablet     [provider]   Cholecalciferol (VITAMIN D-3 PO) Take by mouth    [provider]   polyethylene glycol (GLYCOLAX ) 17 GM/SCOOP powder Take 17 g by mouth daily as needed (constipation) 08/01/24   Guadelupe Mace POUR, MD   atorvastatin  (LIPITOR ) 10 MG tablet Take 1 tablet by mouth daily 08/01/24   Uhlar, Adrianna K, MD   carvedilol  (COREG ) 3.125 MG tablet Take 1 tablet by mouth 2 times daily 08/01/24   Uhlar, Adrianna K, MD   Diabetic Shoe MISC by Does not apply route  Patient not taking: Reported on 08/19/2024 08/01/24   Guadelupe Mace POUR, MD   METAMUCIL FIBER PO Take by mouth    [provider]   trospium  (SANCTURA ) 20 MG tablet Take 1 tablet by mouth daily    [provider]   acetaminophen  (TYLENOL ) 650 MG extended release tablet Take 1 tablet by mouth daily as needed    Automatic Reconciliation, Ar   ascorbic acid (VITAMIN C) 1000 MG tablet Take 1 tablet by mouth daily    Automatic Reconciliation, Ar   aspirin 81 MG EC tablet ceived the following from [facility]: Outside name: aspirin delayed-release 81 mg tablet 10/31/21   Automatic Reconciliation, Ar   diclofenac sodium (VOLTAREN) 1 % GEL Apply 2 g topically 4 times daily    Automatic Reconciliation, Ar   docusate (COLACE, DULCOLAX) 100 MG CAPS Take 100 mg by mouth daily  Patient not taking: Reported on 08/19/2024    Automatic Reconciliation, Ar   Estradiol (VAGIFEM) 10 MCG TABS vaginal tablet Place 1 tablet vaginally  Patient not taking: Reported on 08/19/2024 12/01/21 12/01/22  Automatic Reconciliation, Ar     @CMEDSCH @  Allergies   Allergen Reactions    Nitrofurantoin Rash     Was placed on it 09/2019 and pt noted rash on her hand immediately after starting antibx. Will add it back to her allergy list.      Social History     Tobacco Use    Smoking status: Never    Smokeless tobacco: Never   Substance Use Topics    Alcohol use: No      Family History   Problem Relation Age of Onset    Stroke Father     Cancer Sister     Hypertension Father     Hypertension Mother     Stroke Mother     Diabetes Mother     Stroke Sister           Laboratory: I have personally reviewed the critical care flowsheet and labs.     Recent Labs     10/12/24  0351 10/13/24  0417 10/14/24  0512   WBC 13.7* 17.9* 10.2   HGB 10.5* 11.7 10.6*   HCT 33.1* 35.8 33.2*   PLT 346 394 399     Recent Labs     10/11/24  1744 10/12/24  0351   NA  --  133*   K  --  3.7   CL  --  100   CO2  --  25   BUN  --  8   MG 2.2  --        Objective:     Mode Rate Tidal Volume Pressure FiO2 PEEP                    Vital Signs:     TMAX(24)      Intake/Output:    Last shift:         Last 3 shifts: No intake/output data recorded.[id number]  Intake/Output Summary (Last 24 hours) at 10/14/2024 1127  Last data filed at 10/14/2024 9361  Gross per 24 hour   Intake --   Output 750 ml   Net -750 ml     EXAM:   GENERAL: sleepy, answers some questions, HEENT:  PERRL, EOMI, no alar flaring or epistaxis, oral mucosa moist without cyanosis, NECK:  no jugular vein distention, no retractions, no thyromegaly or masses, LUNGS: CTA, no w/r/r HEART:  Regular rate and rhythm with no MGR; no edema is present, ABDOMEN:  soft with no tenderness, EXTREMITIES:  warm with no cyanosis, SKIN:  no jaundice or ecchymosis, and NEUROLOGIC: Arousable to voice, grossly non-focal    Deitra Ko, APRN - CNP  [facility]        "

## 2024-10-14 NOTE — Plan of Care (Addendum)
 "  Problem: Occupational Therapy - Adult  Goal: By Discharge: Performs self-care activities at highest level of function for planned discharge setting.  See evaluation for individualized goals.  Description: FUNCTIONAL STATUS PRIOR TO ADMISSION:  The patient lives with her daughter and her family. PMH of CVA. Pt has caregivers 9-5 daily, and additional 4 hours in the evening 3 days/week. She typically requires min A for bed mobility and stand pivot transfers to w/c. She performs grooming and feeding ADLs with setup, and has assistance for bathing, dressing and toileting.     Occupational Therapy Goals:  Initiated 10/12/2024  1.  Patient will perform grooming with Set-up seated in chair within 7 day(s).  2.  Patient will perform upper body dressing with Moderate Assist within 7 day(s).  3.  Patient will perform toilet transfers with Moderate Assist  within 7 day(s).  4.  Patient will perform all aspects of toileting with Moderate Assist within 7 day(s).  5.  Patient will participate in upper extremity therapeutic exercise/activities with Supervision for 5 minutes within 7 day(s).    6.  Patient will utilize energy conservation techniques during functional activities with verbal cues within 7 day(s). OCCUPATIONAL THERAPY TREATMENT  Patient: Chelsea Schultz (89 y.o. female)  Date: 10/14/2024  Primary Diagnosis: Hypoxemia [R09.02]  Severe sepsis (HCC) [A41.9, R65.20]  Community acquired pneumonia, unspecified laterality [J18.9]       Precautions:                  Chart, occupational therapy assessment, plan of care, and goals were reviewed.    ASSESSMENT  Patient continues to benefit from skilled OT services and is slowly progressing towards goals. Pt sat edge of bed in prep for [facility]ILY [facility]IV[facility]'s, poor balance, constant support. Mod assist to doff/don gown. Pt stood hand held assist x 2 to adjust linens. Pt using 4 L O2, sats 90%. Returned to supine.             PLAN :  Patient continues to benefit from  skilled intervention to address the above impairments.  Continue treatment per established plan of care to address goals.    Recommendations for mobility with staff: Recommend that staff completes patient mobility at bed level until progress with therapy    Recommendations for toileting with staff: bed pan and external catheter       Recommend next OT session: cont towards goals    Recommendation for discharge: (in order for the patient to meet his/her long term goals):   Moderate intensity short-term skilled occupational therapy up to 5x/week    Other factors to consider for discharge: patient's current support system is unable to meet their requirements for physical assistance, impaired cognition, and concern for safely navigating or managing the home environment    IF patient discharges home will need the following DME:        SUBJECTIVE:   Patient stated my sister made it. re blanket    OBJECTIVE DATA SUMMARY:   Cognitive/Behavioral Status:  Orientation  Overall Orientation Status: Impaired  Orientation Level: Oriented to person  Cognition  Overall Cognitive Status: Exceptions  Arousal/Alertness: Delayed responses to stimuli  Following Commands: Follows one step commands with repetition;Impaired  Attention Span: Impaired;Difficulty attending to directions  Memory: Impaired  Safety Judgement: Impaired    Functional Mobility and Transfers for ADLs:  Bed Mobility:  Bed Mobility Training  Rolling: Substantial/Maximal assistance  Sit to Supine: 2 Person assistance;Partial/Moderate assistance  Scooting: Substantial/Maximal assistance  Transfers:    Max assist x 2 to stand hand held assist           Balance:     Balance  Sitting: Impaired;With support  Sitting - Static: Poor (constant support);Fair (occasional)  Sitting - Dynamic: Poor (constant support);Fair (occasional)  Standing: Impaired      ADL Intervention:     UB dress, mod assist seated edge of bed            Activity Tolerance:   Fair   Please refer to  the flowsheet for vital signs taken during this treatment.    After treatment:   Patient left in no apparent distress in bed and Call bell within reach    COMMUNICATION/EDUCATION:   The patient's plan of care was discussed with: physical therapy assistant, occupational therapist, and registered nurse    Patient Education  Education Given To: Patient  Education Provided: Role of Therapy;Plan of Care  Education Method: Verbal  Barriers to Learning: None  Education Outcome: Continued education needed    Thank you for this referral.  Asberry A. Jenesis Martin, COTA/L  Minutes: 18   "

## 2024-10-14 NOTE — Plan of Care (Signed)
 "  Problem: Skin/Tissue Integrity  Goal: Skin integrity remains intact  Description: 1.  Monitor for areas of redness and/or skin breakdown  2.  Assess vascular access sites hourly  3.  Every 4-6 hours minimum:  Change oxygen saturation probe site  4.  Every 4-6 hours:  If on nasal continuous positive airway pressure, respiratory therapy assess nares and determine need for appliance change or resting period  10/14/2024 1642 by Isaias Hedge, RN  Outcome: Progressing  10/14/2024 0331 by Armida Stamps, RN  Outcome: Progressing     Problem: Safety - Adult  Goal: Free from fall injury  10/14/2024 1642 by Isaias Hedge, RN  Outcome: Progressing  10/14/2024 0331 by Armida Stamps, RN  Outcome: Progressing     Problem: Chronic Conditions and Co-morbidities  Goal: Patient's chronic conditions and co-morbidity symptoms are monitored and maintained or improved  10/14/2024 1642 by Isaias Hedge, RN  Outcome: Progressing  10/14/2024 0331 by Armida Stamps, RN  Outcome: Progressing     Problem: Discharge Planning  Goal: Discharge to home or other facility with appropriate resources  10/14/2024 1642 by Isaias Hedge, RN  Outcome: Progressing  10/14/2024 0331 by Armida Stamps, RN  Outcome: Progressing     Problem: Respiratory - Adult  Goal: Achieves optimal ventilation and oxygenation  10/14/2024 1642 by Isaias Hedge, RN  Outcome: Progressing  10/14/2024 0331 by Armida Stamps, RN  Outcome: Progressing     Problem: Confusion  Goal: Confusion, delirium, dementia, or psychosis is improved or at baseline  Description: INTERVENTIONS:  1. Assess for possible contributors to thought disturbance, including medications, impaired vision or hearing, underlying metabolic abnormalities, dehydration, psychiatric diagnoses, and notify attending LIP  2. Institute high risk fall precautions, as indicated  3. Provide frequent short contacts to provide reality reorientation, refocusing and direction  4. Decrease environmental  stimuli, including noise as appropriate  5. Monitor and intervene to maintain adequate nutrition, hydration, elimination, sleep and activity  6. If unable to ensure safety without constant attention obtain sitter and review sitter guidelines with assigned personnel  7. Initiate Psychosocial CNS and Spiritual Care consult, as indicated  10/14/2024 1642 by Isaias Hedge, RN  Outcome: Progressing  10/14/2024 0331 by Armida Stamps, RN  Outcome: Progressing     Problem: Pain  Goal: Verbalizes/displays adequate comfort level or baseline comfort level  10/14/2024 1642 by Isaias Hedge, RN  Outcome: Progressing  10/14/2024 0331 by Armida Stamps, RN  Outcome: Progressing     Problem: Physical Therapy - Adult  Goal: By Discharge: Performs mobility at highest level of function for planned discharge setting.  See evaluation for individualized goals.  Description: FUNCTIONAL STATUS PRIOR TO ADMISSION: Patient required 1 person assistance for stand pivot transfers from bed <> wheelchair and was non-ambulatory. Stair lift inside home with upstairs bedroom. Ramped entry to home. Requires assistance for most ADLs.    HOME SUPPORT PRIOR TO ADMISSION: The patient lives with daughter and has daily caregiver assistance while daughter is working.    Physical Therapy Goals  Initiated 10/12/2024  1.  Patient will move from supine to sit and sit to supine, scoot up and down, and roll side to side in bed with minimal assistance within 7 day(s).    2.  Patient will perform sit to stand with minimal assistance within 7 day(s).  3.  Patient will sit on EOB with supervision x 5 minutes within 7 day(s).  4.  Patient will transfer from bed to chair and chair to bed with minimal assistance  using the least restrictive device within 7 day(s).    10/14/2024 1603 by Vanice Corean HERO, PTA  Outcome: Progressing     Problem: Occupational Therapy - Adult  Goal: By Discharge: Performs self-care activities at highest level of function for planned  discharge setting.  See evaluation for individualized goals.  Description: FUNCTIONAL STATUS PRIOR TO ADMISSION:  The patient lives with her daughter and her family. PMH of CVA. Pt has caregivers 9-5 daily, and additional 4 hours in the evening 3 days/week. She typically requires min A for bed mobility and stand pivot transfers to w/c. She performs grooming and feeding ADLs with setup, and has assistance for bathing, dressing and toileting.     Occupational Therapy Goals:  Initiated 10/12/2024  1.  Patient will perform grooming with Set-up seated in chair within 7 day(s).  2.  Patient will perform upper body dressing with Moderate Assist within 7 day(s).  3.  Patient will perform toilet transfers with Moderate Assist  within 7 day(s).  4.  Patient will perform all aspects of toileting with Moderate Assist within 7 day(s).  5.  Patient will participate in upper extremity therapeutic exercise/activities with Supervision for 5 minutes within 7 day(s).    6.  Patient will utilize energy conservation techniques during functional activities with verbal cues within 7 day(s).   10/14/2024 1513 by Holmes Stabs A, OTA  Outcome: Progressing  10/14/2024 1513 by Holmes Stabs A, OTA  Outcome: Progressing     Problem: Neurosensory - Adult  Goal: Achieves stable or improved neurological status  10/14/2024 1642 by Isaias Hedge, RN  Outcome: Progressing  10/14/2024 0331 by Armida Stamps, RN  Outcome: Progressing  Goal: Achieves maximal functionality and self care  10/14/2024 1642 by Isaias Hedge, RN  Outcome: Progressing  10/14/2024 0331 by Armida Stamps, RN  Outcome: Progressing     Problem: Cardiovascular - Adult  Goal: Maintains optimal cardiac output and hemodynamic stability  10/14/2024 1642 by Isaias Hedge, RN  Outcome: Progressing  10/14/2024 0331 by Armida Stamps, RN  Outcome: Progressing  Goal: Absence of cardiac dysrhythmias or at baseline  10/14/2024 1642 by Isaias Hedge, RN  Outcome:  Progressing  10/14/2024 0331 by Armida Stamps, RN  Outcome: Progressing     Problem: Skin/Tissue Integrity - Adult  Goal: Skin integrity remains intact  Description: 1.  Monitor for areas of redness and/or skin breakdown  2.  Assess vascular access sites hourly  3.  Every 4-6 hours minimum:  Change oxygen saturation probe site  4.  Every 4-6 hours:  If on nasal continuous positive airway pressure, respiratory therapy assess nares and determine need for appliance change or resting period  10/14/2024 1642 by Isaias Hedge, RN  Outcome: Progressing  10/14/2024 0331 by Armida Stamps, RN  Outcome: Progressing  Goal: Incisions, wounds, or drain sites healing without S/S of infection  10/14/2024 1642 by Isaias Hedge, RN  Outcome: Progressing  10/14/2024 0331 by Armida Stamps, RN  Outcome: Progressing  Goal: Oral mucous membranes remain intact  10/14/2024 1642 by Isaias Hedge, RN  Outcome: Progressing  10/14/2024 0331 by Armida Stamps, RN  Outcome: Progressing     Problem: Musculoskeletal - Adult  Goal: Return mobility to safest level of function  10/14/2024 1642 by Isaias Hedge, RN  Outcome: Progressing  10/14/2024 0331 by Armida Stamps, RN  Outcome: Progressing  Goal: Maintain proper alignment of affected body part  10/14/2024 1642 by Isaias Hedge, RN  Outcome: Progressing  10/14/2024 0331 by Armida Stamps, RN  Outcome:  Progressing  Goal: Return ADL status to a safe level of function  10/14/2024 1642 by Isaias Hedge, RN  Outcome: Progressing  10/14/2024 0331 by Armida Stamps, RN  Outcome: Progressing     Problem: Gastrointestinal - Adult  Goal: Minimal or absence of nausea and vomiting  10/14/2024 1642 by Isaias Hedge, RN  Outcome: Progressing  10/14/2024 0331 by Armida Stamps, RN  Outcome: Progressing  Goal: Maintains or returns to baseline bowel function  10/14/2024 1642 by Isaias Hedge, RN  Outcome: Progressing  10/14/2024 0331 by Armida Stamps, RN  Outcome: Progressing  Goal:  Maintains adequate nutritional intake  10/14/2024 1642 by Isaias Hedge, RN  Outcome: Progressing  10/14/2024 0331 by Armida Stamps, RN  Outcome: Progressing     Problem: Genitourinary - Adult  Goal: Absence of urinary retention  10/14/2024 1642 by Isaias Hedge, RN  Outcome: Progressing  10/14/2024 0331 by Armida Stamps, RN  Outcome: Progressing     Problem: Infection - Adult  Goal: Absence of infection at discharge  10/14/2024 1642 by Isaias Hedge, RN  Outcome: Progressing  10/14/2024 0331 by Armida Stamps, RN  Outcome: Progressing  Goal: Absence of infection during hospitalization  10/14/2024 1642 by Isaias Hedge, RN  Outcome: Progressing  10/14/2024 0331 by Armida Stamps, RN  Outcome: Progressing     Problem: Metabolic/Fluid and Electrolytes - Adult  Goal: Electrolytes maintained within normal limits  10/14/2024 1642 by Isaias Hedge, RN  Outcome: Progressing  10/14/2024 0331 by Armida Stamps, RN  Outcome: Progressing  Goal: Hemodynamic stability and optimal renal function maintained  10/14/2024 1642 by Isaias Hedge, RN  Outcome: Progressing  10/14/2024 0331 by Armida Stamps, RN  Outcome: Progressing  Goal: Glucose maintained within prescribed range  10/14/2024 1642 by Isaias Hedge, RN  Outcome: Progressing  10/14/2024 0331 by Armida Stamps, RN  Outcome: Progressing     Problem: Hematologic - Adult  Goal: Maintains hematologic stability  10/14/2024 1642 by Isaias Hedge, RN  Outcome: Progressing  10/14/2024 0331 by Armida Stamps, RN  Outcome: Progressing     Problem: Safety - Medical Restraint  Goal: Remains free of injury from restraints (Restraint for Interference with Medical Device)  Description: INTERVENTIONS:  1. Determine that other, less restrictive measures have been tried or would not be effective before applying the restraint  2. Evaluate the patient's condition at the time of restraint application  3. Inform patient/family regarding the reason for restraint  4.  Q2H: Monitor safety, psychosocial status, comfort, nutrition and hydration  10/14/2024 1642 by Isaias Hedge, RN  Outcome: Progressing  10/14/2024 0331 by Armida Stamps, RN  Outcome: Progressing     "

## 2024-10-14 NOTE — Progress Notes (Signed)
"  Ultrasound IV by Leeroy Cerise, RN Procedure Note    Ultrasound IV education provided to patient. Opportunities for questions given.     Ultrasound used for PIV placement:  20 gauge 1.75inch  Left forearm location.  1 X Attempt(s).    Flushed with ease; vigorous blood return.     Procedure tolerated well. Primary RN aware of IV placement and added to LDA.      IZETTA FAVRE, RN  "

## 2024-10-14 NOTE — Plan of Care (Signed)
 "  Problem: Skin/Tissue Integrity  Goal: Skin integrity remains intact  Description: 1.  Monitor for areas of redness and/or skin breakdown  2.  Assess vascular access sites hourly  3.  Every 4-6 hours minimum:  Change oxygen saturation probe site  4.  Every 4-6 hours:  If on nasal continuous positive airway pressure, respiratory therapy assess nares and determine need for appliance change or resting period  Outcome: Progressing     Problem: Safety - Adult  Goal: Free from fall injury  Outcome: Progressing     Problem: Chronic Conditions and Co-morbidities  Goal: Patient's chronic conditions and co-morbidity symptoms are monitored and maintained or improved  Outcome: Progressing     Problem: Discharge Planning  Goal: Discharge to home or other facility with appropriate resources  Outcome: Progressing     Problem: Respiratory - Adult  Goal: Achieves optimal ventilation and oxygenation  10/14/2024 0331 by Armida Stamps, RN  Outcome: Progressing  10/14/2024 0013 by Chad Arabia, RCP  Outcome: Progressing     Problem: Neurosensory - Adult  Goal: Achieves stable or improved neurological status  Outcome: Progressing  Goal: Achieves maximal functionality and self care  Outcome: Progressing     Problem: Cardiovascular - Adult  Goal: Maintains optimal cardiac output and hemodynamic stability  Outcome: Progressing  Goal: Absence of cardiac dysrhythmias or at baseline  Outcome: Progressing     Problem: Skin/Tissue Integrity - Adult  Goal: Skin integrity remains intact  Description: 1.  Monitor for areas of redness and/or skin breakdown  2.  Assess vascular access sites hourly  3.  Every 4-6 hours minimum:  Change oxygen saturation probe site  4.  Every 4-6 hours:  If on nasal continuous positive airway pressure, respiratory therapy assess nares and determine need for appliance change or resting period  Outcome: Progressing  Goal: Incisions, wounds, or drain sites healing without S/S of infection  Outcome: Progressing  Goal:  Oral mucous membranes remain intact  Outcome: Progressing     Problem: Musculoskeletal - Adult  Goal: Return mobility to safest level of function  Outcome: Progressing  Goal: Maintain proper alignment of affected body part  Outcome: Progressing  Goal: Return ADL status to a safe level of function  Outcome: Progressing     Problem: Gastrointestinal - Adult  Goal: Minimal or absence of nausea and vomiting  Outcome: Progressing  Goal: Maintains or returns to baseline bowel function  Outcome: Progressing  Goal: Maintains adequate nutritional intake  Outcome: Progressing     Problem: Genitourinary - Adult  Goal: Absence of urinary retention  Outcome: Progressing     Problem: Infection - Adult  Goal: Absence of infection at discharge  Outcome: Progressing  Goal: Absence of infection during hospitalization  Outcome: Progressing     Problem: Metabolic/Fluid and Electrolytes - Adult  Goal: Electrolytes maintained within normal limits  Outcome: Progressing  Goal: Hemodynamic stability and optimal renal function maintained  Outcome: Progressing  Goal: Glucose maintained within prescribed range  Outcome: Progressing     Problem: Hematologic - Adult  Goal: Maintains hematologic stability  Outcome: Progressing     Problem: Confusion  Goal: Confusion, delirium, dementia, or psychosis is improved or at baseline  Description: INTERVENTIONS:  1. Assess for possible contributors to thought disturbance, including medications, impaired vision or hearing, underlying metabolic abnormalities, dehydration, psychiatric diagnoses, and notify attending LIP  2. Institute high risk fall precautions, as indicated  3. Provide frequent short contacts to provide reality reorientation, refocusing and direction  4. Decrease  environmental stimuli, including noise as appropriate  5. Monitor and intervene to maintain adequate nutrition, hydration, elimination, sleep and activity  6. If unable to ensure safety without constant attention obtain sitter and  review sitter guidelines with assigned personnel  7. Initiate Psychosocial CNS and Spiritual Care consult, as indicated  Outcome: Progressing     Problem: Pain  Goal: Verbalizes/displays adequate comfort level or baseline comfort level  Outcome: Progressing     Problem: Safety - Medical Restraint  Goal: Remains free of injury from restraints (Restraint for Interference with Medical Device)  Description: INTERVENTIONS:  1. Determine that other, less restrictive measures have been tried or would not be effective before applying the restraint  2. Evaluate the patient's condition at the time of restraint application  3. Inform patient/family regarding the reason for restraint  4. Q2H: Monitor safety, psychosocial status, comfort, nutrition and hydration  Outcome: Progressing     "

## 2024-10-14 NOTE — Progress Notes (Signed)
"       Nutrition Note    RD contacted by kitchen regarding dietary restrictions. Pt does not eat beef or pork. Nutrition screening on admission was not completed and therefore dietary preferences/restrictions were not made known to RD. Diet order modified.     Electronically signed by Victory Ranger, MS, RD on 10/14/24 at 10:40 AM EST    "

## 2024-10-14 NOTE — Plan of Care (Signed)
"    Problem: Physical Therapy - Adult  Goal: By Discharge: Performs mobility at highest level of function for planned discharge setting.  See evaluation for individualized goals.  Description: FUNCTIONAL STATUS PRIOR TO ADMISSION: Patient required 1 person assistance for stand pivot transfers from bed <> wheelchair and was non-ambulatory. Stair lift inside home with upstairs bedroom. Ramped entry to home. Requires assistance for most ADLs.    HOME SUPPORT PRIOR TO ADMISSION: The patient lives with daughter and has daily caregiver assistance while daughter is working.    Physical Therapy Goals  Initiated 10/12/2024  1.  Patient will move from supine to sit and sit to supine, scoot up and down, and roll side to side in bed with minimal assistance within 7 day(s).    2.  Patient will perform sit to stand with minimal assistance within 7 day(s).  3.  Patient will sit on EOB with supervision x 5 minutes within 7 day(s).  4.  Patient will transfer from bed to chair and chair to bed with minimal assistance using the least restrictive device within 7 day(s).    10/13/2024 1050 by Kristie Rosina FALCON, PT  Outcome: Not Progressing     "

## 2024-10-15 LAB — POCT GLUCOSE
POC Glucose: 300 mg/dL — ABNORMAL HIGH (ref 65–117)
POC Glucose: 259 mg/dL — ABNORMAL HIGH (ref 65–117)
POC Glucose: 291 mg/dL — ABNORMAL HIGH (ref 65–117)
POC Glucose: 235 mg/dL — ABNORMAL HIGH (ref 65–117)

## 2024-10-15 LAB — CBC
Hematocrit: 28.6 % — ABNORMAL LOW (ref 35.0–47.0)
Hemoglobin: 9.6 g/dL — ABNORMAL LOW (ref 11.5–16.0)
MCH: 27.6 pg (ref 26.0–34.0)
MCHC: 33.6 g/dL (ref 30.0–36.5)
MCV: 82.2 FL (ref 80.0–99.0)
MPV: 10 FL (ref 8.9–12.9)
Nucleated RBCs: 0 /100{WBCs}
Platelets: 435 K/uL — ABNORMAL HIGH (ref 150–400)
RBC: 3.48 M/uL — ABNORMAL LOW (ref 3.80–5.20)
RDW: 12.4 % (ref 11.5–14.5)
WBC: 18.8 K/uL — ABNORMAL HIGH (ref 3.6–11.0)
nRBC: 0 K/uL (ref 0.00–0.01)

## 2024-10-15 MED ORDER — INSULIN GLARGINE 100 UNIT/ML SC SOLN
100 | Freq: Every evening | SUBCUTANEOUS | Status: DC
Start: 2024-10-15 — End: 2024-10-19
  Administered 2024-10-16 – 2024-10-19 (×4): 5 [IU] via SUBCUTANEOUS

## 2024-10-15 MED ORDER — ARFORMOTEROL TARTRATE 15 MCG/2ML IN NEBU
15 | Freq: Two times a day (BID) | RESPIRATORY_TRACT | Status: DC
Start: 2024-10-15 — End: 2024-10-15

## 2024-10-15 MED ORDER — BUDESONIDE 0.5 MG/2ML IN SUSP
0.5 | Freq: Two times a day (BID) | RESPIRATORY_TRACT | Status: DC
Start: 2024-10-15 — End: 2024-10-15

## 2024-10-15 MED ORDER — ARFORMOTEROL TARTRATE 15 MCG/2ML IN NEBU
15 | Freq: Two times a day (BID) | RESPIRATORY_TRACT | Status: AC
Start: 2024-10-15 — End: 2024-10-16
  Administered 2024-10-16 (×2): 15 ug via RESPIRATORY_TRACT

## 2024-10-15 MED ORDER — BUDESONIDE 0.5 MG/2ML IN SUSP
0.5 | Freq: Two times a day (BID) | RESPIRATORY_TRACT | Status: AC
Start: 2024-10-15 — End: 2024-10-16
  Administered 2024-10-16 (×2): 500 mg via RESPIRATORY_TRACT

## 2024-10-15 MED FILL — AMPICILLIN-SULBACTAM SODIUM 3 (2-1) G IJ SOLR: 3 (2-1) g | INTRAMUSCULAR | Qty: 3000 | Fill #0

## 2024-10-15 MED FILL — METHYLPREDNISOLONE SODIUM SUCC 40 MG IJ SOLR: 40 mg | INTRAMUSCULAR | Qty: 40 | Fill #0

## 2024-10-15 MED FILL — INSULIN LISPRO 100 UNIT/ML IJ SOLN: 100 [IU]/mL | INTRAMUSCULAR | Qty: 4 | Fill #0

## 2024-10-15 MED FILL — INSULIN LISPRO 100 UNIT/ML IJ SOLN: 100 [IU]/mL | INTRAMUSCULAR | Qty: 6 | Fill #0

## 2024-10-15 MED FILL — INSULIN LISPRO 100 UNIT/ML IJ SOLN: 100 [IU]/mL | INTRAMUSCULAR | Qty: 2 | Fill #0

## 2024-10-15 NOTE — Progress Notes (Signed)
"  Spiritual Care Partner Volunteer visited patient at [facility]. [facility] in [facility] 2 on 10/15/2024   Documented by:  Elia Lamar Gowers, M.Div., [facility].  For Chaplain support, please call Spiritual Health Team @ [phone])    "

## 2024-10-15 NOTE — Care Coordination (Signed)
"  10/15/2024  1:03 PM  Care Management Progress Note    Reason for Admission:   Hypoxemia [R09.02]  Severe sepsis (HCC) [A41.9, R65.20]  Community acquired pneumonia, unspecified laterality [J18.9]         Patient Admission Status: Inpatient  Date Admitted to INP: 10/11/24  [] NA - OBS/Outpatient  RUR: Readmission Risk Score: 18.2    Hospitalization in the last 30 days (Readmission):  No        Transition of care plan:  Awaiting medical clearance and DC order. Pt on IV steriods. Pulm consulted. Therapy following.  SNF - [facility] accepted. Auth not required. Pt will satisfied Medicare's 3 midnight rule on 10/14/24.  Date IM given: Not yet received.   [] NA  Outpatient follow-up.  Discharge transport: TBD    "

## 2024-10-15 NOTE — Plan of Care (Signed)
 "  Problem: Skin/Tissue Integrity  Goal: Skin integrity remains intact  Description: 1.  Monitor for areas of redness and/or skin breakdown  2.  Assess vascular access sites hourly  3.  Every 4-6 hours minimum:  Change oxygen saturation probe site  4.  Every 4-6 hours:  If on nasal continuous positive airway pressure, respiratory therapy assess nares and determine need for appliance change or resting period  10/15/2024 2314 by Houlihan, Joleen, RN  Outcome: Progressing  10/15/2024 1224 by Sheena Fellows, RN  Outcome: Progressing     Problem: Safety - Adult  Goal: Free from fall injury  10/15/2024 2314 by Houlihan, Joleen, RN  Outcome: Progressing  10/15/2024 1224 by Sheena Fellows, RN  Outcome: Progressing     Problem: Chronic Conditions and Co-morbidities  Goal: Patient's chronic conditions and co-morbidity symptoms are monitored and maintained or improved  10/15/2024 2314 by Houlihan, Joleen, RN  Outcome: Progressing  10/15/2024 1224 by Sheena Fellows, RN  Outcome: Progressing     Problem: Discharge Planning  Goal: Discharge to home or other facility with appropriate resources  10/15/2024 2314 by Houlihan, Joleen, RN  Outcome: Progressing  10/15/2024 1224 by Sheena Fellows, RN  Outcome: Progressing     Problem: Respiratory - Adult  Goal: Achieves optimal ventilation and oxygenation  10/15/2024 2314 by Houlihan, Joleen, RN  Outcome: Progressing  10/15/2024 1950 by Meliton Irving, RCP  Outcome: Progressing  10/15/2024 1224 by Sheena Fellows, RN  Outcome: Progressing     Problem: Confusion  Goal: Confusion, delirium, dementia, or psychosis is improved or at baseline  Description: INTERVENTIONS:  1. Assess for possible contributors to thought disturbance, including medications, impaired vision or hearing, underlying metabolic abnormalities, dehydration, psychiatric diagnoses, and notify attending LIP  2. Institute high risk fall precautions, as indicated  3. Provide frequent short contacts to provide reality  reorientation, refocusing and direction  4. Decrease environmental stimuli, including noise as appropriate  5. Monitor and intervene to maintain adequate nutrition, hydration, elimination, sleep and activity  6. If unable to ensure safety without constant attention obtain sitter and review sitter guidelines with assigned personnel  7. Initiate Psychosocial CNS and Spiritual Care consult, as indicated  10/15/2024 2314 by Houlihan, Joleen, RN  Outcome: Progressing  10/15/2024 1224 by Sheena Fellows, RN  Outcome: Progressing     Problem: Pain  Goal: Verbalizes/displays adequate comfort level or baseline comfort level  10/15/2024 2314 by Houlihan, Joleen, RN  Outcome: Progressing  10/15/2024 1224 by Sheena Fellows, RN  Outcome: Progressing     Problem: Physical Therapy - Adult  Goal: By Discharge: Performs mobility at highest level of function for planned discharge setting.  See evaluation for individualized goals.  Description: FUNCTIONAL STATUS PRIOR TO ADMISSION: Patient required 1 person assistance for stand pivot transfers from bed <> wheelchair and was non-ambulatory. Stair lift inside home with upstairs bedroom. Ramped entry to home. Requires assistance for most ADLs.    HOME SUPPORT PRIOR TO ADMISSION: The patient lives with daughter and has daily caregiver assistance while daughter is working.    Physical Therapy Goals  Initiated 10/12/2024  1.  Patient will move from supine to sit and sit to supine, scoot up and down, and roll side to side in bed with minimal assistance within 7 day(s).    2.  Patient will perform sit to stand with minimal assistance within 7 day(s).  3.  Patient will sit on EOB with supervision x 5 minutes within 7 day(s).  4.  Patient will transfer from  bed to chair and chair to bed with minimal assistance using the least restrictive device within 7 day(s).    10/15/2024 1510 by Corral, Jasmin, PT  Outcome: Progressing     Problem: Neurosensory - Adult  Goal: Achieves stable or improved  neurological status  10/15/2024 2314 by Houlihan, Joleen, RN  Outcome: Progressing  10/15/2024 1224 by Sheena Fellows, RN  Outcome: Progressing  Goal: Achieves maximal functionality and self care  10/15/2024 2314 by Houlihan, Joleen, RN  Outcome: Progressing  10/15/2024 1224 by Sheena Fellows, RN  Outcome: Progressing     Problem: Cardiovascular - Adult  Goal: Maintains optimal cardiac output and hemodynamic stability  10/15/2024 2314 by Houlihan, Joleen, RN  Outcome: Progressing  10/15/2024 1224 by Sheena Fellows, RN  Outcome: Progressing  Goal: Absence of cardiac dysrhythmias or at baseline  10/15/2024 2314 by Tery Pao, RN  Outcome: Progressing  10/15/2024 1224 by Sheena Fellows, RN  Outcome: Progressing     Problem: Skin/Tissue Integrity - Adult  Goal: Skin integrity remains intact  Description: 1.  Monitor for areas of redness and/or skin breakdown  2.  Assess vascular access sites hourly  3.  Every 4-6 hours minimum:  Change oxygen saturation probe site  4.  Every 4-6 hours:  If on nasal continuous positive airway pressure, respiratory therapy assess nares and determine need for appliance change or resting period  10/15/2024 2314 by Houlihan, Joleen, RN  Outcome: Progressing  10/15/2024 1224 by Sheena Fellows, RN  Outcome: Progressing  Goal: Incisions, wounds, or drain sites healing without S/S of infection  10/15/2024 2314 by Houlihan, Joleen, RN  Outcome: Progressing  10/15/2024 1224 by Sheena Fellows, RN  Outcome: Progressing  Goal: Oral mucous membranes remain intact  10/15/2024 2314 by Tery Pao, RN  Outcome: Progressing  10/15/2024 1224 by Sheena Fellows, RN  Outcome: Progressing     Problem: Musculoskeletal - Adult  Goal: Return mobility to safest level of function  10/15/2024 2314 by Houlihan, Joleen, RN  Outcome: Progressing  10/15/2024 1224 by Sheena Fellows, RN  Outcome: Progressing  Goal: Maintain proper alignment of affected body part  10/15/2024 2314 by Houlihan, Joleen,  RN  Outcome: Progressing  10/15/2024 1224 by Sheena Fellows, RN  Outcome: Progressing  Goal: Return ADL status to a safe level of function  10/15/2024 2314 by Houlihan, Joleen, RN  Outcome: Progressing  10/15/2024 1224 by Sheena Fellows, RN  Outcome: Progressing     Problem: Gastrointestinal - Adult  Goal: Minimal or absence of nausea and vomiting  10/15/2024 2314 by Houlihan, Joleen, RN  Outcome: Progressing  10/15/2024 1224 by Sheena Fellows, RN  Outcome: Progressing  Goal: Maintains or returns to baseline bowel function  10/15/2024 2314 by Houlihan, Joleen, RN  Outcome: Progressing  10/15/2024 1224 by Sheena Fellows, RN  Outcome: Progressing  Goal: Maintains adequate nutritional intake  10/15/2024 2314 by Houlihan, Joleen, RN  Outcome: Progressing  10/15/2024 1224 by Sheena Fellows, RN  Outcome: Progressing     Problem: Genitourinary - Adult  Goal: Absence of urinary retention  10/15/2024 2314 by Houlihan, Joleen, RN  Outcome: Progressing  10/15/2024 1224 by Sheena Fellows, RN  Outcome: Progressing     Problem: Infection - Adult  Goal: Absence of infection at discharge  10/15/2024 2314 by Houlihan, Joleen, RN  Outcome: Progressing  10/15/2024 1224 by Sheena Fellows, RN  Outcome: Progressing  Goal: Absence of infection during hospitalization  10/15/2024 2314 by Houlihan, Joleen, RN  Outcome: Progressing  10/15/2024 1224 by Sheena Fellows, RN  Outcome:  Progressing     Problem: Metabolic/Fluid and Electrolytes - Adult  Goal: Electrolytes maintained within normal limits  10/15/2024 2314 by Houlihan, Joleen, RN  Outcome: Progressing  10/15/2024 1224 by Sheena Fellows, RN  Outcome: Progressing  Goal: Hemodynamic stability and optimal renal function maintained  10/15/2024 2314 by Tery Pao, RN  Outcome: Progressing  10/15/2024 1224 by Sheena Fellows, RN  Outcome: Progressing  Goal: Glucose maintained within prescribed range  10/15/2024 2314 by Houlihan, Joleen, RN  Outcome: Progressing  10/15/2024 1224 by  Sheena Fellows, RN  Outcome: Progressing     Problem: Hematologic - Adult  Goal: Maintains hematologic stability  10/15/2024 2314 by Houlihan, Joleen, RN  Outcome: Progressing  10/15/2024 1224 by Sheena Fellows, RN  Outcome: Progressing     Problem: Safety - Medical Restraint  Goal: Remains free of injury from restraints (Restraint for Interference with Medical Device)  Description: INTERVENTIONS:  1. Determine that other, less restrictive measures have been tried or would not be effective before applying the restraint  2. Evaluate the patient's condition at the time of restraint application  3. Inform patient/family regarding the reason for restraint  4. Q2H: Monitor safety, psychosocial status, comfort, nutrition and hydration  10/15/2024 2314 by Houlihan, Joleen, RN  Outcome: Progressing  10/15/2024 1224 by Sheena Fellows, RN  Outcome: Progressing     "

## 2024-10-15 NOTE — Progress Notes (Addendum)
 "[facility]  [address] [place], [place] [address]  [phone]        Hospitalist Progress Note      NAME: Chelsea Schultz   DOB:  1926-07-19  MRM:  [id number]    Date/Time of service: 10/15/2024  12:22 PM       Subjective:     Patient was personally seen and examined by me during this time period.  Chart reviewed.    Follow up pneumonia, respiratory failure, UTI, possible pulmonary fibrosis.    Patient reports: no complaints this morning       Objective:       Vitals:       Last 24hrs VS reviewed since prior progress note. Most recent are:    Vitals:    10/15/24 0902   BP:    Pulse:    Resp:    Temp:    SpO2: 93%     SpO2 Readings from Last 6 Encounters:   10/15/24 93%   08/19/24 96%   06/26/23 97%   09/05/22 96%   06/19/22 96%   05/11/22 94%          Intake/Output Summary (Last 24 hours) at 10/15/2024 1222  Last data filed at 10/14/2024 1938  Gross per 24 hour   Intake --   Output 500 ml   Net -500 ml        Exam:     Physical Exam:    Gen:  Well-developed, well-nourished, in no acute distress  HEENT:  Pink conjunctivae, EOMI, hearing intact to voice, moist mucous membranes  Resp:  No accessory muscle use, clear breath sounds without wheezes rales or rhonchi  Card:  No murmurs, normal S1, S2 without thrills, bruits or peripheral edema  Abd:  Soft, non-tender, non-distended, no palpable organomegaly and no detectable hernias  Musc:  No cyanosis or clubbing  Skin:  No rashes or ulcers, skin turgor is good  Neuro: follows commands appropriately  Psych:  poor insight.  Orientation level unclear.  Alert    Medications Reviewed: (see below)    Lab Data Reviewed: (see below)    ______________________________________________________________________    Medications:     Current Facility-Administered Medications   Medication Dose Route Frequency    glucose chewable tablet 16 g  4 tablet Oral PRN    dextrose  bolus 10% 125 mL  125 mL IntraVENous PRN    Or    dextrose  bolus 10% 250 mL  250  mL IntraVENous PRN    glucagon  injection 1 mg  1 mg SubCUTAneous PRN    dextrose  10 % infusion   IntraVENous Continuous PRN    insulin  lispro (HUMALOG ,ADMELOG ) injection vial 0-8 Units  0-8 Units SubCUTAneous 4x Daily AC & HS    ampicillin -sulbactam (UNASYN ) 3,000 mg in sodium chloride  0.9 % 100 mL IVPB (addEASE)  3,000 mg IntraVENous Q6H    methylPREDNISolone  sodium succ (SOLU-MEDROL ) 40 mg in sterile water  1 mL injection  40 mg IntraVENous Q8H    psyllium husk-aspartame (METAMUCIL FIBER) packet 1 packet  1 packet Oral BID    polyethylene glycol (GLYCOLAX ) packet 17 g  17 g Oral Daily    QUEtiapine  (SEROQUEL ) tablet 25 mg  25 mg Oral Q6H PRN    ipratropium 0.5 mg-albuterol  2.5 mg (DUONEB ) nebulizer solution 1 Dose  1 Dose Inhalation Q4H PRN    prochlorperazine  (COMPAZINE ) injection 10 mg  10 mg IntraVENous Q6H PRN    sodium chloride  flush 0.9 %  injection 5-40 mL  5-40 mL IntraVENous 2 times per day    sodium chloride  flush 0.9 % injection 5-40 mL  5-40 mL IntraVENous PRN    0.9 % sodium chloride  infusion   IntraVENous PRN    potassium chloride  (KLOR-CON ) extended release tablet 40 mEq  40 mEq Oral PRN    Or    potassium bicarb-citric acid  (EFFER-K) effervescent tablet 40 mEq  40 mEq Oral PRN    Or    potassium chloride  10 mEq/100 mL IVPB (Peripheral Line)  10 mEq IntraVENous PRN    magnesium  sulfate 2000 mg in 50 mL IVPB premix  2,000 mg IntraVENous PRN    enoxaparin  Sodium (LOVENOX ) injection 30 mg  30 mg SubCUTAneous Daily    acetaminophen  (TYLENOL ) tablet 650 mg  650 mg Oral Q6H PRN    Or    acetaminophen  (TYLENOL ) suppository 650 mg  650 mg Rectal Q6H PRN    melatonin tablet 6 mg  6 mg Oral Nightly PRN    bisacodyl  (DULCOLAX) EC tablet 5 mg  5 mg Oral Daily PRN    atorvastatin  (LIPITOR ) tablet 10 mg  10 mg Oral Daily    carvedilol  (COREG ) tablet 3.125 mg  3.125 mg Oral BID    trospium  (SANCTURA ) tablet 20 mg  20 mg Oral BID AC    losartan  (COZAAR ) tablet 100 mg  100 mg Oral Daily    albuterol  (PROVENTIL ) (2.5  MG/3ML) 0.083% nebulizer solution 2.5 mg  2.5 mg Nebulization Q4H PRN    benzonatate  (TESSALON ) capsule 100 mg  100 mg Oral TID PRN          Lab Review:     Recent Labs     10/13/24  0417 10/14/24  0512 10/15/24  0721   WBC 17.9* 10.2 18.8*   HGB 11.7 10.6* 9.6*   HCT 35.8 33.2* 28.6*   PLT 394 399 435*     No results for input(s): NA, K, CL, CO2, GLU, BUN, MG, PHOS, ALT, INR in the last 72 hours.    Invalid input(s): CREA, CA, ALB, TBIL, TBILI, SGOT    No results found for: GLUCPOC       Assessment / Plan:     Chelsea Schultz is a 89 y.o. female with DM2, hypertension, overactive bladder, and recurrent UTIs.  She was admitted to this hospital on 10/10/2024 for evaluation/management of acute hypoxic respiratory failure thought to be secondary to pulmonary fibrosis or community-acquired pneumonia.     #Acute hypoxemic respiratory failure  #Pulmonary fibrosis  #Community-acquired pneumonia: Reason for admission.  CT chest from admission shows diffuse interstitial prominence and ground glass opacities suspicious for pulmonary fibrosis and pneumonitis versus pneumonia.  BNP normal for age, procalcitonin not elevated.  Not clinically fluid overloaded on exam.  Lower suspicion for PE.  COVID and flu negative.  RVP negative.  Patient was never a smoker.  No known occupational exposures per daughter.  CTA of the chest showed no interval change  - Continue empiric Unasyn   - completed azithro  - Continue scheduled nebulized bronchodilators  - Continue steroids  - Appreciate pulm  - Wean oxygen as able     #UTI: POA.  Acute.  Culture and antibiotics as below     Sepsis secondary to pneumonia and UTI: Met criteria on admission with elevated WBC (stable) and elevated heart rate (improved) with suspected pneumonia and UTI.  Procalcitonin not elevated.  - Urine growing Enterococcus sensitive to ampicillin   - Continue azithromycin  and Unasyn   -  Blood cultures have no growth     #Delirium  -  Restraints as needed  - Delirium precautions as able     #Normocytic anemia: Not present on admission.  Suspect she has chronic anemia that was masked by hemoconcentration on admission.  Improved now  - We can stop monitoring     #Hyponatremia: POA, stable.     #Hypercalcemia: POA, improved.  Presumably from dehydration.     #Hypomagnesemia  - scheduled magnesium  x 3 days     #DM2 with hyperglycemia  - Hold home metformin   - sliding scale  - add lantus      #Overactive bladder  - Continue home meds     #Hypertension  - Continue home carvedilol  and losartan   - Hold home amlodipine      #Hyperlipidemia  - Continue home atorvastatin      #Dementia: Mild/moderate per daughter.    In caring for this patient today, I reviewed recent notes, interpreted lab results and imaging tests, had a face-to-face encounter with the patient, performing the medically necessary appropriate exam and history, counseled the patient/family/caregiver, ordered medications, tests and procedures as indicated for diagnostic and therapeutic purposes, consulted and communicated with other healthcare professionals including care coordination and discharge planning, and documented clinical information in the electronic health record.                 Care Plan discussed with: Patient, Care Manager, and Nursing Staff    Discussed:  Care Plan and D/C Planning    Prophylaxis:  Lovenox     Disposition:  To be determined           ___________________________________________________    Attending Physician: Othella Crock, MD       "

## 2024-10-15 NOTE — Plan of Care (Signed)
 "  Problem: Skin/Tissue Integrity  Goal: Skin integrity remains intact  Description: 1.  Monitor for areas of redness and/or skin breakdown  2.  Assess vascular access sites hourly  3.  Every 4-6 hours minimum:  Change oxygen saturation probe site  4.  Every 4-6 hours:  If on nasal continuous positive airway pressure, respiratory therapy assess nares and determine need for appliance change or resting period  Outcome: Progressing     Problem: Safety - Adult  Goal: Free from fall injury  Outcome: Progressing     Problem: Chronic Conditions and Co-morbidities  Goal: Patient's chronic conditions and co-morbidity symptoms are monitored and maintained or improved  Outcome: Progressing     Problem: Discharge Planning  Goal: Discharge to home or other facility with appropriate resources  Outcome: Progressing     Problem: Respiratory - Adult  Goal: Achieves optimal ventilation and oxygenation  Outcome: Progressing     Problem: Neurosensory - Adult  Goal: Achieves stable or improved neurological status  Outcome: Progressing     Problem: Neurosensory - Adult  Goal: Achieves maximal functionality and self care  Outcome: Progressing     Problem: Cardiovascular - Adult  Goal: Maintains optimal cardiac output and hemodynamic stability  Outcome: Progressing     Problem: Cardiovascular - Adult  Goal: Absence of cardiac dysrhythmias or at baseline  Outcome: Progressing     Problem: Skin/Tissue Integrity - Adult  Goal: Skin integrity remains intact  Description: 1.  Monitor for areas of redness and/or skin breakdown  2.  Assess vascular access sites hourly  3.  Every 4-6 hours minimum:  Change oxygen saturation probe site  4.  Every 4-6 hours:  If on nasal continuous positive airway pressure, respiratory therapy assess nares and determine need for appliance change or resting period  Outcome: Progressing     Problem: Skin/Tissue Integrity - Adult  Goal: Incisions, wounds, or drain sites healing without S/S of infection  Outcome:  Progressing     Problem: Skin/Tissue Integrity - Adult  Goal: Oral mucous membranes remain intact  Outcome: Progressing     Problem: Musculoskeletal - Adult  Goal: Return mobility to safest level of function  Outcome: Progressing     Problem: Musculoskeletal - Adult  Goal: Maintain proper alignment of affected body part  Outcome: Progressing     Problem: Musculoskeletal - Adult  Goal: Return ADL status to a safe level of function  Outcome: Progressing     Problem: Gastrointestinal - Adult  Goal: Minimal or absence of nausea and vomiting  Outcome: Progressing     Problem: Gastrointestinal - Adult  Goal: Maintains or returns to baseline bowel function  Outcome: Progressing     Problem: Gastrointestinal - Adult  Goal: Maintains adequate nutritional intake  Outcome: Progressing     Problem: Genitourinary - Adult  Goal: Absence of urinary retention  Outcome: Progressing     Problem: Infection - Adult  Goal: Absence of infection at discharge  Outcome: Progressing     Problem: Infection - Adult  Goal: Absence of infection during hospitalization  Outcome: Progressing     Problem: Metabolic/Fluid and Electrolytes - Adult  Goal: Electrolytes maintained within normal limits  Outcome: Progressing     Problem: Metabolic/Fluid and Electrolytes - Adult  Goal: Hemodynamic stability and optimal renal function maintained  Outcome: Progressing     Problem: Metabolic/Fluid and Electrolytes - Adult  Goal: Glucose maintained within prescribed range  Outcome: Progressing     Problem: Hematologic - Adult  Goal:  Maintains hematologic stability  Outcome: Progressing     Problem: Confusion  Goal: Confusion, delirium, dementia, or psychosis is improved or at baseline  Description: INTERVENTIONS:  1. Assess for possible contributors to thought disturbance, including medications, impaired vision or hearing, underlying metabolic abnormalities, dehydration, psychiatric diagnoses, and notify attending LIP  2. Institute high risk fall precautions,  as indicated  3. Provide frequent short contacts to provide reality reorientation, refocusing and direction  4. Decrease environmental stimuli, including noise as appropriate  5. Monitor and intervene to maintain adequate nutrition, hydration, elimination, sleep and activity  6. If unable to ensure safety without constant attention obtain sitter and review sitter guidelines with assigned personnel  7. Initiate Psychosocial CNS and Spiritual Care consult, as indicated  Outcome: Progressing     Problem: Pain  Goal: Verbalizes/displays adequate comfort level or baseline comfort level  Outcome: Progressing     Problem: Safety - Medical Restraint  Goal: Remains free of injury from restraints (Restraint for Interference with Medical Device)  Description: INTERVENTIONS:  1. Determine that other, less restrictive measures have been tried or would not be effective before applying the restraint  2. Evaluate the patient's condition at the time of restraint application  3. Inform patient/family regarding the reason for restraint  4. Q2H: Monitor safety, psychosocial status, comfort, nutrition and hydration  Outcome: Progressing     "

## 2024-10-15 NOTE — Plan of Care (Signed)
 "  Problem: Physical Therapy - Adult  Goal: By Discharge: Performs mobility at highest level of function for planned discharge setting.  See evaluation for individualized goals.  Description: FUNCTIONAL STATUS PRIOR TO ADMISSION: Patient required 1 person assistance for stand pivot transfers from bed <> wheelchair and was non-ambulatory. Stair lift inside home with upstairs bedroom. Ramped entry to home. Requires assistance for most ADLs.    HOME SUPPORT PRIOR TO ADMISSION: The patient lives with daughter and has daily caregiver assistance while daughter is working.    Physical Therapy Goals  Initiated 10/12/2024  1.  Patient will move from supine to sit and sit to supine, scoot up and down, and roll side to side in bed with minimal assistance within 7 day(s).    2.  Patient will perform sit to stand with minimal assistance within 7 day(s).  3.  Patient will sit on EOB with supervision x 5 minutes within 7 day(s).  4.  Patient will transfer from bed to chair and chair to bed with minimal assistance using the least restrictive device within 7 day(s).    Outcome: Progressing   PHYSICAL THERAPY TREATMENT    Patient: Chelsea Schultz (89 y.o. female)  Date: 10/15/2024  Diagnosis: Hypoxemia [R09.02]  Severe sepsis (HCC) [A41.9, R65.20]  Community acquired pneumonia, unspecified laterality [J18.9] Severe sepsis (HCC)      Precautions:              ASSESSMENT:  Patient continues to benefit from skilled PT services and is slowly progressing towards goals. Pt cleared by RN, received in sidelying receiving care from PCT and RN, family at bedside, and agreeable to therapy. Pt required mod Ax2 for rolling and supine > sit and noted to have increased forward flexion seated EOB with increased DOE. SpO2 noted at ~86% and educated on PLB technique to recover > 90% on 6L NC. Pt completed scapular/postural exercises seated EOB with fair carryover, requiring repetition and increased time to complete with tactile facilitation. Pt  completed 3x sit <> stands trials with heavy posterior lean requiring mod-max Ax2 and cueing provided for anterior weight shift and hip/trunk extension. Pt reporting fatigue and returned to supine and repositioned in R sidelying for pressure relief. Pt with stable sats > 94% on 6L NC and left with all needs met and family present- RN updated on session.        PLAN:  Patient continues to benefit from skilled intervention to address the above impairments.  Continue treatment per established plan of care.      Recommendations for mobility with staff: Recommend that staff completes patient mobility with assist x2 using gait belt and Camie Ip.    Recommendations for toileting with staff:  recommended toilet device: a bedside commode.     Recommend for next PT session: monitor O2 saturations during activty    Recommendation for discharge: (in order for the patient to meet his/her long term goals):   Moderate intensity short-term skilled physical therapy up to 5x/week    Other factors to consider for discharge: poor safety awareness, impaired cognition, and not safe to be alone    IF patient discharges home will need the following DME: continuing to assess with progress       SUBJECTIVE:   Patient stated, I guess so.    OBJECTIVE DATA SUMMARY:   Critical Behavior:  Orientation  Overall Orientation Status: Impaired  Orientation Level: Oriented to person  Cognition  Overall Cognitive Status: Exceptions  Arousal/Alertness: Appropriate responses  to stimuli  Following Commands: Follows one step commands with repetition;Impaired;Follows one step commands with increased time  Attention Span: Impaired;Attends with cues to redirect  Problem Solving: Impaired;Decreased awareness of errors  Insights: Decreased awareness of deficits  Initiation: Requires cues for some  Sequencing: Requires cues for all    Functional Mobility Training:  Bed Mobility:  Bed Mobility Training  Bed Mobility Training: Yes  Interventions: Verbal  cues;Tactile cues;Weight shifting training/pressure relief;Safety awareness training  Rolling: Partial/Moderate assistance;2 Person assistance  Sit to Supine: 2 Person assistance;Partial/Moderate assistance  Scooting: Partial/Moderate assistance;2 Person assistance  Transfers:  Transfer Training  Transfer Training: Yes  Interventions: Safety awareness training;Tactile cues;Verbal cues;Weight shifting training/pressure relief;Manual cues;Visual cues  Sit to Stand: 2 Person assistance;Partial/Moderate assistance  Stand to Sit: Substantial/Maximal assistance;2 Person assistance  Balance:  Balance  Sitting: Impaired;With support  Sitting - Static: Fair (occasional)  Sitting - Dynamic: Fair (occasional);Poor (constant support)  Standing: Impaired  Standing - Static: Constant support;Poor  Standing - Dynamic: Not tested   Ambulation/Gait Training:     Gait  Gait Training: No       Pain Rating:  No pain reported.     Activity Tolerance:   Fair , Poor, observed shortness of breath on exertion, and desaturates with activity and requires oxygen    After treatment:   Patient left in no apparent distress in bed, Call bell within reach, Bed/ chair alarm activated, Caregiver / family present, Heels elevated for pressure relief, and Patient offloaded in partial R side lying for pressure relief      COMMUNICATION/EDUCATION:   The patient's plan of care was discussed with: registered nurse and rehabilitation technician    Patient Education  Education Provided: Role of Therapy;Plan of Care;Mobility Training;Transfer Social Worker Provided Comments: PLB, upright activity for improved pulmonary function, UE/LE therapeutic exercises  Education Method: Verbal  Barriers to Learning: Cognition  Education Outcome: Continued education needed;Demonstrated understanding      Jasmin Corral, PT  Minutes: 47    "

## 2024-10-16 LAB — CBC
Hematocrit: 30.8 % — ABNORMAL LOW (ref 35.0–47.0)
Hemoglobin: 9.8 g/dL — ABNORMAL LOW (ref 11.5–16.0)
MCH: 26.8 pg (ref 26.0–34.0)
MCHC: 31.8 g/dL (ref 30.0–36.5)
MCV: 84.2 FL (ref 80.0–99.0)
MPV: 9.6 FL (ref 8.9–12.9)
Nucleated RBCs: 0 /100{WBCs}
Platelets: 504 K/uL — ABNORMAL HIGH (ref 150–400)
RBC: 3.66 M/uL — ABNORMAL LOW (ref 3.80–5.20)
RDW: 12.5 % (ref 11.5–14.5)
WBC: 19.1 K/uL — ABNORMAL HIGH (ref 3.6–11.0)
nRBC: 0 K/uL (ref 0.00–0.01)

## 2024-10-16 LAB — POCT GLUCOSE
POC Glucose: 277 mg/dL — ABNORMAL HIGH (ref 65–117)
POC Glucose: 194 mg/dL — ABNORMAL HIGH (ref 65–117)
POC Glucose: 281 mg/dL — ABNORMAL HIGH (ref 65–117)
POC Glucose: 160 mg/dL — ABNORMAL HIGH (ref 65–117)

## 2024-10-16 MED ORDER — TROSPIUM CHLORIDE 20 MG PO TABS
20 | Freq: Every day | ORAL | Status: DC
Start: 2024-10-16 — End: 2024-10-19
  Administered 2024-10-17 – 2024-10-18 (×2): 20 mg via ORAL

## 2024-10-16 MED FILL — LOSARTAN POTASSIUM 50 MG PO TABS: 50 mg | ORAL | Qty: 2 | Fill #0

## 2024-10-16 MED FILL — AMPICILLIN-SULBACTAM SODIUM 3 (2-1) G IJ SOLR: 3 (2-1) g | INTRAMUSCULAR | Qty: 3000 | Fill #0

## 2024-10-16 MED FILL — POLYETHYLENE GLYCOL 3350 17 G PO PACK: 17 g | ORAL | Qty: 1 | Fill #0

## 2024-10-16 MED FILL — METHYLPREDNISOLONE SODIUM SUCC 40 MG IJ SOLR: 40 mg | INTRAMUSCULAR | Qty: 40 | Fill #0

## 2024-10-16 MED FILL — LANTUS 100 UNIT/ML SC SOLN: 100 [IU]/mL | SUBCUTANEOUS | Qty: 5 | Fill #0

## 2024-10-16 MED FILL — INSULIN LISPRO 100 UNIT/ML IJ SOLN: 100 [IU]/mL | INTRAMUSCULAR | Qty: 2 | Fill #0

## 2024-10-16 MED FILL — TROSPIUM CHLORIDE 20 MG PO TABS: 20 mg | ORAL | Qty: 1 | Fill #0

## 2024-10-16 MED FILL — INSULIN LISPRO 100 UNIT/ML IJ SOLN: 100 [IU]/mL | INTRAMUSCULAR | Qty: 4 | Fill #0

## 2024-10-16 MED FILL — METAMUCIL 4 IN 1 FIBER 51.7 % PO PACK: 51.7 % | ORAL | Qty: 1 | Fill #0

## 2024-10-16 MED FILL — CARVEDILOL 3.125 MG PO TABS: 3.125 mg | ORAL | Qty: 1 | Fill #0

## 2024-10-16 MED FILL — ENOXAPARIN SODIUM 30 MG/0.3ML IJ SOSY: 30 MG/0.3ML | INTRAMUSCULAR | Qty: 0.3 | Fill #0

## 2024-10-16 MED FILL — ATORVASTATIN CALCIUM 10 MG PO TABS: 10 mg | ORAL | Qty: 1 | Fill #0

## 2024-10-16 NOTE — Plan of Care (Signed)
 "  Problem: Skin/Tissue Integrity  Goal: Skin integrity remains intact  Description: 1.  Monitor for areas of redness and/or skin breakdown  2.  Assess vascular access sites hourly  3.  Every 4-6 hours minimum:  Change oxygen saturation probe site  4.  Every 4-6 hours:  If on nasal continuous positive airway pressure, respiratory therapy assess nares and determine need for appliance change or resting period  10/16/2024 1115 by Sheena Fellows, RN  Outcome: Progressing     Problem: Safety - Adult  Goal: Free from fall injury  10/16/2024 1115 by Sheena Fellows, RN  Outcome: Progressing     Problem: Chronic Conditions and Co-morbidities  Goal: Patient's chronic conditions and co-morbidity symptoms are monitored and maintained or improved  10/16/2024 1115 by Sheena Fellows, RN  Outcome: Progressing     Problem: Discharge Planning  Goal: Discharge to home or other facility with appropriate resources  10/16/2024 1115 by Sheena Fellows, RN  Outcome: Progressing     Problem: Respiratory - Adult  Goal: Achieves optimal ventilation and oxygenation  10/16/2024 1115 by Sheena Fellows, RN  Outcome: Progressing     Problem: Neurosensory - Adult  Goal: Achieves stable or improved neurological status  10/16/2024 1115 by Sheena Fellows, RN  Outcome: Progressing     Problem: Neurosensory - Adult  Goal: Achieves maximal functionality and self care  10/16/2024 1115 by Sheena Fellows, RN  Outcome: Progressing     Problem: Cardiovascular - Adult  Goal: Maintains optimal cardiac output and hemodynamic stability  10/16/2024 1115 by Sheena Fellows, RN  Outcome: Progressing     Problem: Cardiovascular - Adult  Goal: Absence of cardiac dysrhythmias or at baseline  10/16/2024 1115 by Sheena Fellows, RN  Outcome: Progressing     Problem: Skin/Tissue Integrity - Adult  Goal: Skin integrity remains intact  Description: 1.  Monitor for areas of redness and/or skin breakdown  2.  Assess vascular access sites hourly  3.  Every 4-6 hours minimum:   Change oxygen saturation probe site  4.  Every 4-6 hours:  If on nasal continuous positive airway pressure, respiratory therapy assess nares and determine need for appliance change or resting period  10/16/2024 1115 by Sheena Fellows, RN  Outcome: Progressing     Problem: Skin/Tissue Integrity - Adult  Goal: Incisions, wounds, or drain sites healing without S/S of infection  10/16/2024 1115 by Sheena Fellows, RN  Outcome: Progressing     Problem: Skin/Tissue Integrity - Adult  Goal: Oral mucous membranes remain intact  10/16/2024 1115 by Sheena Fellows, RN  Outcome: Progressing     Problem: Musculoskeletal - Adult  Goal: Return mobility to safest level of function  10/16/2024 1115 by Sheena Fellows, RN  Outcome: Progressing     Problem: Musculoskeletal - Adult  Goal: Maintain proper alignment of affected body part  10/16/2024 1115 by Sheena Fellows, RN  Outcome: Progressing     Problem: Musculoskeletal - Adult  Goal: Return ADL status to a safe level of function  10/16/2024 1115 by Sheena Fellows, RN  Outcome: Progressing     Problem: Gastrointestinal - Adult  Goal: Minimal or absence of nausea and vomiting  10/16/2024 1115 by Sheena Fellows, RN  Outcome: Progressing     Problem: Gastrointestinal - Adult  Goal: Maintains or returns to baseline bowel function  10/16/2024 1115 by Sheena Fellows, RN  Outcome: Progressing     Problem: Gastrointestinal - Adult  Goal: Maintains adequate nutritional intake  10/16/2024 1115 by Sheena Fellows, RN  Outcome:  Progressing     Problem: Genitourinary - Adult  Goal: Absence of urinary retention  10/16/2024 1115 by Sheena Fellows, RN  Outcome: Progressing     "

## 2024-10-16 NOTE — Progress Notes (Signed)
 "[facility]  [address] [place], [place] [address]  [phone]        Hospitalist Progress Note      NAME: Chelsea Schultz   DOB:  09/09/1926  MRM:  [id number]    Date/Time of service: 10/16/2024  8:31 AM       Subjective:     Patient was personally seen and examined by me during this time period.  Chart reviewed.    Follow up pneumonia, respiratory failure, UTI, possible pulmonary fibrosis.    Patient reports: no complaints this morning       Objective:       Vitals:       Last 24hrs VS reviewed since prior progress note. Most recent are:    Vitals:    10/16/24 0300   BP:    Pulse:    Resp:    Temp:    SpO2: 98%     SpO2 Readings from Last 6 Encounters:   10/16/24 98%   08/19/24 96%   06/26/23 97%   09/05/22 96%   06/19/22 96%   05/11/22 94%          Intake/Output Summary (Last 24 hours) at 10/16/2024 0831  Last data filed at 10/16/2024 0344  Gross per 24 hour   Intake 360 ml   Output 700 ml   Net -340 ml        Exam:     Physical Exam:    Gen:  Well-developed, well-nourished, in no acute distress  HEENT:  Pink conjunctivae, EOMI, hearing intact to voice, moist mucous membranes  Resp:  No accessory muscle use, clear breath sounds without wheezes rales or rhonchi  Card:  No murmurs, normal S1, S2 without thrills, bruits or peripheral edema  Abd:  Soft, non-tender, non-distended, no palpable organomegaly and no detectable hernias  Musc:  No cyanosis or clubbing  Skin:  No rashes or ulcers, skin turgor is good  Neuro: follows commands appropriately  Psych:  poor insight.  Orientation level unclear.  Alert    Medications Reviewed: (see below)    Lab Data Reviewed: (see below)    ______________________________________________________________________    Medications:     Current Facility-Administered Medications   Medication Dose Route Frequency    arformoterol  tartrate (BROVANA ) nebulizer solution 15 mcg  15 mcg Nebulization BID RT    budesonide  (PULMICORT ) nebulizer suspension 500  mcg  0.5 mg Nebulization BID RT    insulin  glargine (LANTUS ) injection vial 5 Units  5 Units SubCUTAneous Nightly    glucose chewable tablet 16 g  4 tablet Oral PRN    dextrose  bolus 10% 125 mL  125 mL IntraVENous PRN    Or    dextrose  bolus 10% 250 mL  250 mL IntraVENous PRN    glucagon  injection 1 mg  1 mg SubCUTAneous PRN    dextrose  10 % infusion   IntraVENous Continuous PRN    insulin  lispro (HUMALOG ,ADMELOG ) injection vial 0-8 Units  0-8 Units SubCUTAneous 4x Daily AC & HS    ampicillin -sulbactam (UNASYN ) 3,000 mg in sodium chloride  0.9 % 100 mL IVPB (addEASE)  3,000 mg IntraVENous Q6H    methylPREDNISolone  sodium succ (SOLU-MEDROL ) 40 mg in sterile water  1 mL injection  40 mg IntraVENous Q8H    psyllium husk-aspartame (METAMUCIL FIBER) packet 1 packet  1 packet Oral BID    polyethylene glycol (GLYCOLAX ) packet 17 g  17 g Oral Daily    QUEtiapine  (SEROQUEL ) tablet 25  mg  25 mg Oral Q6H PRN    ipratropium 0.5 mg-albuterol  2.5 mg (DUONEB ) nebulizer solution 1 Dose  1 Dose Inhalation Q4H PRN    prochlorperazine  (COMPAZINE ) injection 10 mg  10 mg IntraVENous Q6H PRN    sodium chloride  flush 0.9 % injection 5-40 mL  5-40 mL IntraVENous 2 times per day    sodium chloride  flush 0.9 % injection 5-40 mL  5-40 mL IntraVENous PRN    0.9 % sodium chloride  infusion   IntraVENous PRN    potassium chloride  (KLOR-CON ) extended release tablet 40 mEq  40 mEq Oral PRN    Or    potassium bicarb-citric acid  (EFFER-K) effervescent tablet 40 mEq  40 mEq Oral PRN    Or    potassium chloride  10 mEq/100 mL IVPB (Peripheral Line)  10 mEq IntraVENous PRN    magnesium  sulfate 2000 mg in 50 mL IVPB premix  2,000 mg IntraVENous PRN    enoxaparin  Sodium (LOVENOX ) injection 30 mg  30 mg SubCUTAneous Daily    acetaminophen  (TYLENOL ) tablet 650 mg  650 mg Oral Q6H PRN    Or    acetaminophen  (TYLENOL ) suppository 650 mg  650 mg Rectal Q6H PRN    melatonin tablet 6 mg  6 mg Oral Nightly PRN    bisacodyl  (DULCOLAX) EC tablet 5 mg  5 mg Oral Daily  PRN    atorvastatin  (LIPITOR ) tablet 10 mg  10 mg Oral Daily    carvedilol  (COREG ) tablet 3.125 mg  3.125 mg Oral BID    trospium  (SANCTURA ) tablet 20 mg  20 mg Oral BID AC    losartan  (COZAAR ) tablet 100 mg  100 mg Oral Daily    albuterol  (PROVENTIL ) (2.5 MG/3ML) 0.083% nebulizer solution 2.5 mg  2.5 mg Nebulization Q4H PRN    benzonatate  (TESSALON ) capsule 100 mg  100 mg Oral TID PRN          Lab Review:     Recent Labs     10/14/24  0512 10/15/24  0721   WBC 10.2 18.8*   HGB 10.6* 9.6*   HCT 33.2* 28.6*   PLT 399 435*     No results for input(s): NA, K, CL, CO2, GLU, BUN, MG, PHOS, ALT, INR in the last 72 hours.    Invalid input(s): CREA, CA, ALB, TBIL, TBILI, SGOT    No results found for: GLUCPOC       Assessment / Plan:     Chelsea Schultz is a 89 y.o. female with DM2, hypertension, overactive bladder, and recurrent UTIs.  She was admitted to this hospital on 10/10/2024 for evaluation/management of acute hypoxic respiratory failure thought to be secondary to pulmonary fibrosis or community-acquired pneumonia.     #Acute hypoxemic respiratory failure  #Pulmonary fibrosis  #Community-acquired pneumonia: Reason for admission.  CT chest from admission shows diffuse interstitial prominence and ground glass opacities suspicious for pulmonary fibrosis and pneumonitis versus pneumonia.  BNP normal for age, procalcitonin not elevated.  Not clinically fluid overloaded on exam.  Lower suspicion for PE.  COVID and flu negative.  RVP negative.  Patient was never a smoker.  No known occupational exposures per daughter.  CTA of the chest showed no interval change  - Continue empiric Unasyn   - completed azithro  - Continue scheduled nebulized bronchodilators  - Continue steroids  - Appreciate pulm  - Wean oxygen as able  - she keeps having setbacks because she pulls off O2. No sitter for now but may need it     #  UTI: POA.  Acute.  Culture and antibiotics as below     Sepsis secondary to  pneumonia and UTI: Met criteria on admission with elevated WBC (stable) and elevated heart rate (improved) with suspected pneumonia and UTI.  Procalcitonin not elevated.  - Urine growing Enterococcus sensitive to ampicillin   - Continue azithromycin  and Unasyn   - Blood cultures have no growth     #Delirium  - Restraints as needed  - Delirium precautions as able     #Normocytic anemia: Not present on admission.  Suspect she has chronic anemia that was masked by hemoconcentration on admission.  Improved now  - We can stop monitoring     #Hyponatremia: POA, stable.     #Hypercalcemia: POA, improved.  Presumably from dehydration.     #Hypomagnesemia  - scheduled magnesium  x 3 days     #DM2 with hyperglycemia  - Hold home metformin   - sliding scale  - add lantus      #Overactive bladder  - Continue home meds     #Hypertension  - Continue home carvedilol  and losartan   - Hold home amlodipine      #Hyperlipidemia  - Continue home atorvastatin      #Dementia: Mild/moderate per daughter.    In caring for this patient today, I reviewed recent notes, interpreted lab results and imaging tests, had a face-to-face encounter with the patient, performing the medically necessary appropriate exam and history, counseled the patient/family/caregiver, ordered medications, tests and procedures as indicated for diagnostic and therapeutic purposes, consulted and communicated with other healthcare professionals including care coordination and discharge planning, and documented clinical information in the electronic health record.                 Care Plan discussed with: Patient, Care Manager, and Nursing Staff    Discussed:  Care Plan and D/C Planning    Prophylaxis:  Lovenox     Disposition:  To be determined           ___________________________________________________    Attending Physician: Othella Crock, MD       "

## 2024-10-17 ENCOUNTER — Inpatient Hospital Stay: Admit: 2024-10-17 | Payer: MEDICARE

## 2024-10-17 ENCOUNTER — Inpatient Hospital Stay: Payer: MEDICARE

## 2024-10-17 LAB — ECHO (TTE) COMPLETE (PRN CONTRAST/BUBBLE/STRAIN/3D)
AR Max Velocity PISA: 3.8 m/s
AR PHT: 420.9 ms
AV Area by Peak Velocity: 2.1 cm2
AV Area by VTI: 2.4 cm2
AV Mean Gradient: 3 mmHg
AV Mean Velocity: 0.8 m/s
AV Peak Gradient: 6 mmHg
AV Peak Velocity: 1.3 m/s
AV VTI: 24.3 cm
AV Velocity Ratio: 0.69
AVA/BSA Peak Velocity: 1.5 cm2/m2
AVA/BSA VTI: 1.8 cm2/m2
Ao Root Index: 2.26 cm/m2
Aortic Root: 3.1 cm
Ascending Aorta Index: 2.34 cm/m2
Ascending Aorta: 3.2 cm
Body Surface Area: 1.37 m2
E/E' Lateral: 11.9
E/E' Ratio (Averaged): 13.97
E/E' Septal: 16.05
EF Physician: 75 %
Est. RA Pressure: 3 mmHg
Fractional Shortening 2D: 36 % (ref 28–44)
IVSd: 1 cm — AB (ref 0.6–0.9)
LA Diameter: 5 cm
LA Size Index: 3.65 cm/m2
LA Volume A-L A4C: 28 mL (ref 22–52)
LA Volume A-L A4C: 67 mL — AB (ref 22–52)
LA Volume A/L: 48 mL
LA Volume BP: 44 mL (ref 22–52)
LA Volume Index A-L A2C: 20 mL/m2 (ref 16–34)
LA Volume Index A-L A4C: 49 mL/m2 — AB (ref 16–34)
LA Volume Index A/L: 35 mL/m2 (ref 16–34)
LA Volume Index BP: 32 mL/m2 (ref 16–34)
LA Volume Index MOD A2C: 20 mL/m2 (ref 16–34)
LA Volume Index MOD A4C: 44 mL/m2 — AB (ref 16–34)
LA Volume MOD A2C: 27 mL (ref 22–52)
LA Volume MOD A4C: 60 mL — AB (ref 22–52)
LA/AO Root Ratio: 1.61
LV E' Lateral Velocity: 5.8 cm/s
LV E' Septal Velocity: 4.3 cm/s
LV EDV A2C: 20 mL
LV EDV A4C: 25 mL
LV EDV BP: 22 mL (ref 56–104)
LV EDV Index A2C: 15 mL/m2
LV EDV Index A4C: 18 mL/m2
LV EDV Index BP: 16 mL/m2
LV ESV A2C: 5 mL
LV ESV A4C: 5 mL
LV ESV BP: 5 mL (ref 19–49)
LV ESV Index A2C: 4 mL/m2
LV ESV Index A4C: 4 mL/m2
LV ESV Index BP: 4 mL/m2
LV Ejection Fraction A2C: 73 %
LV Ejection Fraction A4C: 78 %
LV Mass 2D Index: 54.3 g/m2 (ref 43–95)
LV Mass 2D: 74.3 g (ref 67–162)
LV RWT Ratio: 0.71
LVIDd Index: 2.04 cm/m2
LVIDd: 2.8 cm — AB (ref 3.9–5.3)
LVIDs Index: 1.31 cm/m2
LVIDs: 1.8 cm
LVOT Area: 2.8 cm2
LVOT Diameter: 1.9 cm
LVOT Mean Gradient: 2 mmHg
LVOT Peak Gradient: 3 mmHg
LVOT Peak Velocity: 0.9 m/s
LVOT SV: 57.1 mL
LVOT Stroke Volume Index: 41.4 mL/m2
LVOT VTI: 20 cm
LVOT:AV VTI Index: 0.82
LVPWd: 1 cm — AB (ref 0.6–0.9)
MV A Velocity: 1.1 m/s
MV E Velocity: 0.69 m/s
MV E Wave Deceleration Time: 219.2 ms
MV E/A: 0.63
RV Free Wall Peak S': 19.3 cm/s
RVSP: 36 mmHg
TAPSE: 1.9 cm (ref 1.7–?)
TR Max Velocity: 2.87 m/s
TR Peak Gradient: 33 mmHg

## 2024-10-17 LAB — BASIC METABOLIC PANEL
Anion Gap: 7 mmol/L (ref 2–14)
BUN/Creatinine Ratio: 32 — ABNORMAL HIGH (ref 12–20)
BUN: 20 mg/dL (ref 8–23)
CO2: 28 mmol/L (ref 20–29)
Calcium: 10.1 mg/dL — ABNORMAL HIGH (ref 8.2–9.6)
Chloride: 104 mmol/L (ref 98–107)
Creatinine: 0.63 mg/dL (ref 0.60–1.00)
Est, Glom Filt Rate: 81 ml/min/1.73m2 (ref >59)
Glucose: 189 mg/dL — ABNORMAL HIGH (ref 65–100)
Potassium: 4.1 mmol/L (ref 3.5–5.1)
Sodium: 139 mmol/L (ref 136–145)

## 2024-10-17 LAB — CBC
Hematocrit: 31.3 % — ABNORMAL LOW (ref 35.0–47.0)
Hemoglobin: 9.9 g/dL — ABNORMAL LOW (ref 11.5–16.0)
MCH: 26.6 pg (ref 26.0–34.0)
MCHC: 31.6 g/dL (ref 30.0–36.5)
MCV: 84.1 FL (ref 80.0–99.0)
MPV: 9.7 FL (ref 8.9–12.9)
Nucleated RBCs: 0 /100{WBCs}
Platelets: 485 K/uL — ABNORMAL HIGH (ref 150–400)
RBC: 3.72 M/uL — ABNORMAL LOW (ref 3.80–5.20)
RDW: 12.4 % (ref 11.5–14.5)
WBC: 15.3 K/uL — ABNORMAL HIGH (ref 3.6–11.0)
nRBC: 0 K/uL (ref 0.00–0.01)

## 2024-10-17 LAB — POCT GLUCOSE
POC Glucose: 176 mg/dL — ABNORMAL HIGH (ref 65–117)
POC Glucose: 214 mg/dL — ABNORMAL HIGH (ref 65–117)
POC Glucose: 219 mg/dL — ABNORMAL HIGH (ref 65–117)
POC Glucose: 250 mg/dL — ABNORMAL HIGH (ref 65–117)

## 2024-10-17 LAB — TROPONIN: Troponin T: 12.2 ng/L (ref 0–14)

## 2024-10-17 LAB — BLOOD GAS, ARTERIAL
Base Excess, Arterial: 5.9 mmol/L
HCO3, Arterial: 29 mmol/L — ABNORMAL HIGH (ref 22–26)
O2 Flow Rate: 5 L/min
POC O2 SAT: 90 % — ABNORMAL LOW (ref 92–97)
pCO2, Arterial: 38 mmHg (ref 35–45)
pH, Arterial: 7.5 — ABNORMAL HIGH (ref 7.35–7.45)
pO2, Arterial: 52 mmHg — ABNORMAL LOW (ref 80–100)

## 2024-10-17 LAB — IRON AND TIBC
Iron % Saturation: 7 %
Iron: 14 ug/dL — ABNORMAL LOW (ref 37–145)
TIBC: 185 ug/dL — ABNORMAL LOW (ref 250–450)
UIBC: 171 ug/dL (ref 112.0–347.0)

## 2024-10-17 LAB — PHOSPHORUS: Phosphorus: 3.1 mg/dL (ref 2.5–4.5)

## 2024-10-17 LAB — BRAIN NATRIURETIC PEPTIDE: NT Pro-BNP: 1356 pg/mL — ABNORMAL HIGH (ref 0–450)

## 2024-10-17 LAB — MAGNESIUM: Magnesium: 2 mg/dL (ref 1.7–2.3)

## 2024-10-17 LAB — C-REACTIVE PROTEIN: CRP: 2.1 mg/dL — ABNORMAL HIGH (ref 0.0–0.5)

## 2024-10-17 LAB — FERRITIN: Ferritin: 192 ng/mL (ref 13–252)

## 2024-10-17 MED ORDER — KETOROLAC TROMETHAMINE 15 MG/ML IJ SOLN
15 | Freq: Four times a day (QID) | INTRAMUSCULAR | Status: DC | PRN
Start: 2024-10-17 — End: 2024-10-19
  Administered 2024-10-19: 15:00:00 15 mg via INTRAVENOUS

## 2024-10-17 MED ORDER — LACTATED RINGERS IV SOLN
INTRAVENOUS | Status: AC
Start: 2024-10-17 — End: 2024-10-18
  Administered 2024-10-17: 18:00:00 via INTRAVENOUS

## 2024-10-17 MED ORDER — CULTURELLE PO CAPS
Freq: Every day | ORAL | Status: DC
Start: 2024-10-17 — End: 2024-10-19
  Administered 2024-10-17 – 2024-10-18 (×2): 1 via ORAL

## 2024-10-17 MED ORDER — IPRATROPIUM-ALBUTEROL 0.5-2.5 (3) MG/3ML IN SOLN
0.5-2.5 | Freq: Four times a day (QID) | RESPIRATORY_TRACT | Status: DC
Start: 2024-10-17 — End: 2024-10-20
  Administered 2024-10-17 – 2024-10-19 (×7): 1 via RESPIRATORY_TRACT

## 2024-10-17 MED ORDER — BUMETANIDE 0.25 MG/ML IJ SOLN
0.25 | Freq: Once | INTRAMUSCULAR | Status: AC
Start: 2024-10-17 — End: 2024-10-17
  Administered 2024-10-17: 17:00:00 1 mg via INTRAVENOUS

## 2024-10-17 MED ORDER — PROBIOTIC BLEND PO CAPS
Freq: Every day | ORAL | Status: DC
Start: 2024-10-17 — End: 2024-10-16

## 2024-10-17 MED FILL — AMPICILLIN-SULBACTAM SODIUM 3 (2-1) G IJ SOLR: 3 (2-1) g | INTRAMUSCULAR | Qty: 3000 | Fill #0

## 2024-10-17 MED FILL — INSULIN LISPRO 100 UNIT/ML IJ SOLN: 100 [IU]/mL | INTRAMUSCULAR | Qty: 4 | Fill #0

## 2024-10-17 MED FILL — METAMUCIL 4 IN 1 FIBER 51.7 % PO PACK: 51.7 % | ORAL | Qty: 1 | Fill #0

## 2024-10-17 MED FILL — LACTATED RINGERS IV SOLN: INTRAVENOUS | Qty: 1000 | Fill #0

## 2024-10-17 MED FILL — METHYLPREDNISOLONE SODIUM SUCC 40 MG IJ SOLR: 40 mg | INTRAMUSCULAR | Qty: 40 | Fill #0

## 2024-10-17 MED FILL — CARVEDILOL 3.125 MG PO TABS: 3.125 mg | ORAL | Qty: 1 | Fill #0

## 2024-10-17 MED FILL — LOSARTAN POTASSIUM 50 MG PO TABS: 50 mg | ORAL | Qty: 2 | Fill #0

## 2024-10-17 MED FILL — ATORVASTATIN CALCIUM 10 MG PO TABS: 10 mg | ORAL | Qty: 1 | Fill #0

## 2024-10-17 MED FILL — BUMETANIDE 0.25 MG/ML IJ SOLN: 0.25 mg/mL | INTRAMUSCULAR | Qty: 4 | Fill #0

## 2024-10-17 MED FILL — CULTURELLE IMMUNITY SUPPORT PO CAPS: ORAL | Qty: 1 | Fill #0

## 2024-10-17 MED FILL — LANTUS 100 UNIT/ML SC SOLN: 100 [IU]/mL | SUBCUTANEOUS | Qty: 5 | Fill #0

## 2024-10-17 MED FILL — TROSPIUM CHLORIDE 20 MG PO TABS: 20 mg | ORAL | Qty: 1 | Fill #0

## 2024-10-17 MED FILL — MELATONIN 3 MG PO TABS: 3 mg | ORAL | Qty: 2 | Fill #0

## 2024-10-17 MED FILL — POLYETHYLENE GLYCOL 3350 17 G PO PACK: 17 g | ORAL | Qty: 1 | Fill #0

## 2024-10-17 MED FILL — QUETIAPINE FUMARATE 25 MG PO TABS: 25 mg | ORAL | Qty: 1 | Fill #0

## 2024-10-17 MED FILL — ENOXAPARIN SODIUM 30 MG/0.3ML IJ SOSY: 30 MG/0.3ML | INTRAMUSCULAR | Qty: 0.3 | Fill #0

## 2024-10-17 NOTE — Plan of Care (Signed)
"    Problem: Safety - Adult  Goal: Free from fall injury  10/17/2024 1955 by Stephane Iha, RN  Outcome: Progressing  10/17/2024 1952 by Stephane Iha, RN  Outcome: Progressing     "

## 2024-10-17 NOTE — ACP (Advance Care Planning) (Signed)
"  Advance Care Planning     Advance Care Planning (ACP) Physician/NP/PA Conversation    Date of Conversation: 10/10/2024  Diagnosis: pulmonary fibrosis, dementia    Conducted with: Daughters, Marylin  Other persons present:     Healthcare Decision Maker: Marylyn and Cathy     Care Preferences:    Hospitalization:  If your health worsens and it becomes clear that your chance of recovery is unlikely, what would be your preference regarding hospitalization?  Juda Lajeunesse everything for now    Ventilation:  If you were unable to breath on your own and your chance of recovery was unlikely, what would be your preference about the use of a ventilator (breathing machine) if it was available to you?  DNR    Resuscitation:  In the event your heart stopped as a result of an underlying serious health condition, would you want attempts made to restart your heart, or would you prefer a natural death?  DNR        Conversation Outcomes / Follow-Up Plan:  Discussed with family (daughters) regarding current medical issues, prognosis, and treatments.  [name] is POA and confirmed DNR status.  Patient was living with another daughter, [name], prior to admission.  Goal is for SNF, then back home.  I brought up that patient's condition might deteriorate and she might need hospice.  Family will consider     Length of Voluntary ACP Conversation in minutes:  25 minutes    Rodrick Has, MD                "

## 2024-10-17 NOTE — Progress Notes (Signed)
 "[facility]  [address] [place], [place] [address]  [phone]        Hospitalist Progress Note      NAME: Chelsea Schultz   DOB:  05-Dec-1925  MRM:  [id number]    Date/Time of service: 10/17/2024  1:09 PM       Subjective:     Chief Complaint:  Patient was personally seen and examined by me during this time period.  Chart reviewed.  Patient is more lethargic today.  Had vague chest pain.  Discussed with family at bedside       Objective:       Vitals:       Last 24hrs VS reviewed since prior progress note. Most recent are:    Vitals:    10/16/24 2330   BP:    Pulse:    Resp:    Temp:    SpO2: 97%     SpO2 Readings from Last 6 Encounters:   10/16/24 97%   08/19/24 96%   06/26/23 97%   09/05/22 96%   06/19/22 96%   05/11/22 94%          Intake/Output Summary (Last 24 hours) at 10/17/2024 1309  Last data filed at 10/16/2024 1848  Gross per 24 hour   Intake --   Output 250 ml   Net -250 ml        Exam:     Physical Exam:    Gen:  elderly, ill-appearing, mild lethargy  HEENT:  Pink conjunctivae, PERRL, hearing intact to voice, dry mucous membranes  Neck:  Supple, without masses  Resp:  No accessory muscle use, clear breath sounds without wheezes rales or rhonchi  Card:  No murmurs, normal S1, S2 without thrills, bruits or peripheral edema  Abd:  Soft, non-tender, non-distended, normoactive bowel sounds are present  Musc:  No cyanosis or clubbing  Skin:  No rashes  Neuro:  moves all ext, followed some commands  Psych:  no insight    Medications Reviewed: (see below)    Lab Data Reviewed: (see below)    ______________________________________________________________________    Medications:     Current Facility-Administered Medications   Medication Dose Route Frequency    ketorolac (TORADOL) injection 15 mg  15 mg IntraVENous Q6H PRN    lactated ringers infusion   IntraVENous Continuous    ipratropium 0.5 mg-albuterol  2.5 mg (DUONEB ) nebulizer solution 1 Dose  1 Dose Inhalation Q6H WA  RT    trospium  (SANCTURA ) tablet 20 mg  20 mg Oral Daily    lactobacillus (CULTURELLE) capsule 1 capsule  1 capsule Oral Daily with breakfast    insulin  glargine (LANTUS ) injection vial 5 Units  5 Units SubCUTAneous Nightly    glucose chewable tablet 16 g  4 tablet Oral PRN    dextrose  bolus 10% 125 mL  125 mL IntraVENous PRN    Or    dextrose  bolus 10% 250 mL  250 mL IntraVENous PRN    glucagon  injection 1 mg  1 mg SubCUTAneous PRN    dextrose  10 % infusion   IntraVENous Continuous PRN    insulin  lispro (HUMALOG ,ADMELOG ) injection vial 0-8 Units  0-8 Units SubCUTAneous 4x Daily AC & HS    ampicillin -sulbactam (UNASYN ) 3,000 mg in sodium chloride  0.9 % 100 mL IVPB (addEASE)  3,000 mg IntraVENous Q6H    methylPREDNISolone  sodium succ (SOLU-MEDROL ) 40 mg in sterile water  1 mL injection  40 mg IntraVENous Q8H  psyllium husk-aspartame (METAMUCIL FIBER) packet 1 packet  1 packet Oral BID    polyethylene glycol (GLYCOLAX ) packet 17 g  17 g Oral Daily    QUEtiapine  (SEROQUEL ) tablet 25 mg  25 mg Oral Q6H PRN    ipratropium 0.5 mg-albuterol  2.5 mg (DUONEB ) nebulizer solution 1 Dose  1 Dose Inhalation Q4H PRN    prochlorperazine  (COMPAZINE ) injection 10 mg  10 mg IntraVENous Q6H PRN    sodium chloride  flush 0.9 % injection 5-40 mL  5-40 mL IntraVENous 2 times per day    sodium chloride  flush 0.9 % injection 5-40 mL  5-40 mL IntraVENous PRN    0.9 % sodium chloride  infusion   IntraVENous PRN    enoxaparin  Sodium (LOVENOX ) injection 30 mg  30 mg SubCUTAneous Daily    acetaminophen  (TYLENOL ) tablet 650 mg  650 mg Oral Q6H PRN    Or    acetaminophen  (TYLENOL ) suppository 650 mg  650 mg Rectal Q6H PRN    melatonin tablet 6 mg  6 mg Oral Nightly PRN    bisacodyl  (DULCOLAX) EC tablet 5 mg  5 mg Oral Daily PRN    atorvastatin  (LIPITOR ) tablet 10 mg  10 mg Oral Daily    carvedilol  (COREG ) tablet 3.125 mg  3.125 mg Oral BID    losartan  (COZAAR ) tablet 100 mg  100 mg Oral Daily    albuterol  (PROVENTIL ) (2.5 MG/3ML) 0.083% nebulizer  solution 2.5 mg  2.5 mg Nebulization Q4H PRN    benzonatate  (TESSALON ) capsule 100 mg  100 mg Oral TID PRN          Lab Review:     Recent Labs     10/15/24  0721 10/16/24  1003 10/17/24  0509   WBC 18.8* 19.1* 15.3*   HGB 9.6* 9.8* 9.9*   HCT 28.6* 30.8* 31.3*   PLT 435* 504* 485*     Recent Labs     10/17/24  0509   NA 139   K 4.1   CL 104   CO2 28   BUN 20   MG 2.0   PHOS 3.1     No results found for: GLUCPOC       Assessment / Plan:     89 yo Hx of HTN, DM, interstitial lung dz, dementia, presented w/ AMS, resp failure, hypoxia, pulm fibrosis, enterococcus UTI    1) Acute resp failure/hypoxia: slow to improve.  Likely due to new pulmonary fibrosis with possible pneumonia.  Poor prognosis.  Cont O2, IV abx, IV steroids.  Pulm following    2) Pneumonia: CAP vs aspiration.  Cont empiric IV Unasyn .  Consult speech    3) Acute met encephalopathy: due to hypoxia, UTI, with underlying dementia.  Will obtain head CT, ammonia, TSH.  Monitor closely    4) Pleuritic chest pain: due to pulm fibrosis.  EKG non-ischemic.  Will use IV toradol prn    5) UTI: Ucx w/ enterococcus.  Cont IV Unasyn     6) Hypercalcemia: likely from dehydration.  Cont IVF, monitor     7) DM type 2: check A1C.  Cont lantus , SSI    8) HTN: cont norvasc , coreg , losartan     9) Dementia: cont supportive care.  Might need hospice    Code: DNR    **Prior records, notes, labs, radiology, and medications reviewed in [facility]**    Total time spent with patient care: 45 Minutes **I personally saw and examined the patient during this time period**  Care Plan discussed with: Patient, nursing, family     Discussed:  Care Plan    Prophylaxis:  Lovenox     Disposition:  SNF/LTC vs hospice           ___________________________________________________    Attending Physician: Rodrick Has, MD     "

## 2024-10-17 NOTE — Progress Notes (Signed)
 "[facility]     Name: Chelsea Schultz MRN: [id number]   DOB: [birth year] Hospital: [facility]   Date: 10/17/2024        Impression Plan   Acute respiratory failure  Hypoxia  Bilateral PNA  Hypoxia  ILD exacerbation  Pulmonary fibrosis- UIP  GERD  Leukocytosis  HxofCVA  Delirium               Continue to wean O2 as tolerated   Continue antibiotics   Continue IVCS  RVP negative   BP control  OOB as soon as pt more awake, attempt to have her sitting in high Fowlers  Prognosis guarded             Radiology  ( personally reviewed) CTA chest 10/11/24: Bilateral pulmonary fibrosis in UIP pattern with superimposed  GG infiltrates   ABG Invalid input(s): PHI, PO2I, PCO2I       Subjective     Cc: abnormal CT chest    89 yo with PMHx of dementia, CVA, spinal stenosis, chronic debilitation and pulmonary fibrosis who presented to the ER on 12/19 with increased somnolence and  weakness. Pt was found to be hypoxic. CT chest revealed bilateral GG infiltrates superimposed on known pulmonary fibrosis. Pt seen at [facility] in 2017 with plan for PFTs and repeat CT chest, but did not return for follow up. Pt is altered. Wakes up and answers some questions. Daughter denies any hx of GERD/episodes of vomiting. Denies any witnessed choking or aspiration episodes. Never smoker.     12/23: Arousable to voice, able to state name and open eyes.  On 3.5 L, satting 91%. No leukocytosis on a.m. labs.  10-17-24: remains on 5 LPM.  Having issues with pulling off her O2 due to delirium.      Review of Systems:  Review of systems not obtained due to patient factors.    Past Medical History:   Diagnosis Date    Arthritis     CVA (cerebral infarction)     Diabetes (HCC) 01/09/2015    Hemoglobin A1C 6.4 (per records, done at outside facility)    GERD (gastroesophageal reflux disease)     History of recurrent UTIs     on chronic antibiotic prophylaxis, followed by urology    HLD (hyperlipidemia) 01/09/2015    Total Chol  141, Tri 49, HDL 59, LDL 72    Hypertension     Neuropathy     Osteopetrosis     Spinal stenosis       Past Surgical History:   Procedure Laterality Date    ORTHOPEDIC SURGERY      Lumbar laminectomy    ORTHOPEDIC SURGERY      Lumbar fusion      Prior to Admission medications   Medication Sig Start Date End Date Taking? Authorizing Provider   pregabalin  (LYRICA ) 100 MG capsule TAKE 1 CAPSULE BY MOUTH DAILY AS NEEDED FOR NERVE PAIN  Patient taking differently: Take 1 capsule by mouth daily. TAKE 1 CAPSULE BY MOUTH DAILY FOR NERVE PAIN 09/16/24 12/17/24 Yes Uhlar, Adrianna K, MD   amLODIPine  (NORVASC ) 10 MG tablet TAKE 1 TABLET(10 MG) BY MOUTH EVERY NIGHT  Patient taking differently: Take 1 tablet by mouth daily TAKE 1 TABLET(10 MG) BY MOUTH EVERY NIGHT 07/17/24  Yes Ledwith, Alexandra G, MD   mirabegron  (MYRBETRIQ ) 50 MG TB24 Take 50 mg by mouth daily 06/26/23  Yes Ledwith, Alexandra G, MD   metFORMIN  (GLUCOPHAGE ) 500 MG tablet  TAKE 1 TABLET BY MOUTH TWICE DAILY WITH A MEAL 09/02/24   Ledwith, Alexandra G, MD   famotidine  (PEPCID ) 40 MG tablet TAKE 1/2 TABLET BY MOUTH EVERY MORNING AND 1/2 TABLET AT BEDTIME 08/21/24   Ledwith, Alexandra G, MD   losartan  (COZAAR ) 100 MG tablet TAKE 1 TABLET BY MOUTH DAILY 08/21/24 08/16/25  Ledwith, Alexandra G, MD   Vaginal Lubricant (VAGISIL LUBRICANT) GEL Place vaginally    [provider]   vitamin B-12 (CYANOCOBALAMIN) 1000 MCG tablet     [provider]   Cholecalciferol (VITAMIN D-3 PO) Take by mouth    [provider]   polyethylene glycol (GLYCOLAX ) 17 GM/SCOOP powder Take 17 g by mouth daily as needed (constipation) 08/01/24   Guadelupe Mace POUR, MD   atorvastatin  (LIPITOR ) 10 MG tablet Take 1 tablet by mouth daily 08/01/24   Uhlar, Adrianna K, MD   carvedilol  (COREG ) 3.125 MG tablet Take 1 tablet by mouth 2 times daily 08/01/24   Uhlar, Adrianna K, MD   Diabetic Shoe MISC by Does not apply route  Patient not taking: Reported on 08/19/2024 08/01/24    Guadelupe Mace POUR, MD   METAMUCIL FIBER PO Take by mouth    [provider]   trospium  (SANCTURA ) 20 MG tablet Take 1 tablet by mouth daily    [provider]   acetaminophen  (TYLENOL ) 650 MG extended release tablet Take 1 tablet by mouth daily as needed    Automatic Reconciliation, Ar   ascorbic acid (VITAMIN C) 1000 MG tablet Take 1 tablet by mouth daily    Automatic Reconciliation, Ar   aspirin 81 MG EC tablet ceived the following from [facility]: Outside name: aspirin delayed-release 81 mg tablet 10/31/21   Automatic Reconciliation, Ar   diclofenac sodium (VOLTAREN) 1 % GEL Apply 2 g topically 4 times daily    Automatic Reconciliation, Ar   docusate (COLACE, DULCOLAX) 100 MG CAPS Take 100 mg by mouth daily  Patient not taking: Reported on 08/19/2024    Automatic Reconciliation, Ar   Estradiol (VAGIFEM) 10 MCG TABS vaginal tablet Place 1 tablet vaginally  Patient not taking: Reported on 08/19/2024 12/01/21 12/01/22  Automatic Reconciliation, Ar     @CMEDSCH @  Allergies   Allergen Reactions    Nitrofurantoin Rash     Was placed on it 09/2019 and pt noted rash on her hand immediately after starting antibx. Will add it back to her allergy list.      Social History     Tobacco Use    Smoking status: Never    Smokeless tobacco: Never   Substance Use Topics    Alcohol use: No      Family History   Problem Relation Age of Onset    Stroke Father     Cancer Sister     Hypertension Father     Hypertension Mother     Stroke Mother     Diabetes Mother     Stroke Sister           Laboratory: I have personally reviewed the critical care flowsheet and labs.     Recent Labs     10/15/24  0721 10/16/24  1003 10/17/24  0509   WBC 18.8* 19.1* 15.3*   HGB 9.6* 9.8* 9.9*   HCT 28.6* 30.8* 31.3*   PLT 435* 504* 485*     Recent Labs     10/17/24  0509   NA 139   K 4.1  CL 104   CO2 28   BUN 20   MG 2.0   PHOS 3.1       Objective:     Mode Rate Tidal Volume Pressure FiO2 PEEP                    Vital Signs:      TMAX(24)      Intake/Output:   Last shift:         Last 3 shifts: No intake/output data recorded.[id number]  Intake/Output Summary (Last 24 hours) at 10/17/2024 1136  Last data filed at 10/16/2024 1848  Gross per 24 hour   Intake --   Output 250 ml   Net -250 ml     EXAM:   GENERAL: sleepy, answers some questions, HEENT:  PERRL, EOMI, no alar flaring or epistaxis, oral mucosa moist without cyanosis, NECK:  no jugular vein distention, no retractions, no thyromegaly or masses, LUNGS: CTA, no w/r/r HEART:  Regular rate and rhythm with no MGR; no edema is present, ABDOMEN:  soft with no tenderness, EXTREMITIES:  warm with no cyanosis, SKIN:  no jaundice or ecchymosis, and NEUROLOGIC: Arousable to voice, grossly non-focal    Venetia Albany, MD  [facility]        "

## 2024-10-17 NOTE — Significant Event (Signed)
"  Rapid Response called at 1252    Responded to rapid response at 1252 for chest pain    Provider at bedside: Do, MD  Interventions ordered: EKG, troponin, CXR, CT head  Sepsis suspected: no  Transfer to higher level of care: no    Called for chest pain. On arrival pt lying in bed, no acute distress, primary RN relayed to RRT RN that family had come out of the room stating the pt was c/o chest pain. VS as follows: HR 76, RR 16, BP 163/62 (96), 95% on 5LNC. On assessment pt alert but not communicative and did not answer questions asked, unable to assess PQRSTs of chest pain. EKG obtained reflecting baseline rhythm. DO, MD at bedside; new orders: troponin, CXR, CT head     Reviewed plan of care with primary RN and encouraged to reach out with any further concerns; no further RRT interventions needed at this time.    Rapid ended at 1310    RRT RN assisted with transport to accepting unit: n/a    "

## 2024-10-17 NOTE — Progress Notes (Signed)
"  Physical/Occupational Therapy Note: Pt opens eyes to voice however very sleepy for activity. Will cont to follow.  "

## 2024-10-17 NOTE — Progress Notes (Signed)
"  PHYSICAL THERAPY:Pt sleeping soundly.Pt opened eyes briefly slightly after multiple attempts to awaken.Pt unable to stay awake.PT/OT will continue to follow.  "

## 2024-10-17 NOTE — Progress Notes (Signed)
"  Upon my arrival to the patient's room, a rapid response was initiated.  "

## 2024-10-17 NOTE — Plan of Care (Signed)
"    Problem: Safety - Adult  Goal: Free from fall injury  Outcome: Progressing     "

## 2024-10-17 NOTE — Plan of Care (Signed)
 "  Problem: Skin/Tissue Integrity  Goal: Skin integrity remains intact  Description: 1.  Monitor for areas of redness and/or skin breakdown  2.  Assess vascular access sites hourly  3.  Every 4-6 hours minimum:  Change oxygen saturation probe site  4.  Every 4-6 hours:  If on nasal continuous positive airway pressure, respiratory therapy assess nares and determine need for appliance change or resting period  Outcome: Progressing     Problem: Safety - Adult  Goal: Free from fall injury  Outcome: Progressing     Problem: Chronic Conditions and Co-morbidities  Goal: Patient's chronic conditions and co-morbidity symptoms are monitored and maintained or improved  Outcome: Progressing     Problem: Discharge Planning  Goal: Discharge to home or other facility with appropriate resources  Outcome: Progressing     Problem: Respiratory - Adult  Goal: Achieves optimal ventilation and oxygenation  Outcome: Progressing     Problem: Neurosensory - Adult  Goal: Achieves stable or improved neurological status  Outcome: Progressing  Goal: Achieves maximal functionality and self care  Outcome: Progressing     Problem: Cardiovascular - Adult  Goal: Maintains optimal cardiac output and hemodynamic stability  Outcome: Progressing  Goal: Absence of cardiac dysrhythmias or at baseline  Outcome: Progressing     Problem: Skin/Tissue Integrity - Adult  Goal: Skin integrity remains intact  Description: 1.  Monitor for areas of redness and/or skin breakdown  2.  Assess vascular access sites hourly  3.  Every 4-6 hours minimum:  Change oxygen saturation probe site  4.  Every 4-6 hours:  If on nasal continuous positive airway pressure, respiratory therapy assess nares and determine need for appliance change or resting period  Outcome: Progressing  Goal: Incisions, wounds, or drain sites healing without S/S of infection  Outcome: Progressing  Goal: Oral mucous membranes remain intact  Outcome: Progressing     Problem: Musculoskeletal - Adult  Goal:  Return mobility to safest level of function  Outcome: Progressing  Goal: Maintain proper alignment of affected body part  Outcome: Progressing  Goal: Return ADL status to a safe level of function  Outcome: Progressing     Problem: Gastrointestinal - Adult  Goal: Minimal or absence of nausea and vomiting  Outcome: Progressing  Goal: Maintains or returns to baseline bowel function  Outcome: Progressing  Goal: Maintains adequate nutritional intake  Outcome: Progressing     Problem: Genitourinary - Adult  Goal: Absence of urinary retention  Outcome: Progressing     Problem: Infection - Adult  Goal: Absence of infection at discharge  Outcome: Progressing  Goal: Absence of infection during hospitalization  Outcome: Progressing     Problem: Metabolic/Fluid and Electrolytes - Adult  Goal: Electrolytes maintained within normal limits  Outcome: Progressing  Goal: Hemodynamic stability and optimal renal function maintained  Outcome: Progressing  Goal: Glucose maintained within prescribed range  Outcome: Progressing     Problem: Hematologic - Adult  Goal: Maintains hematologic stability  Outcome: Progressing     Problem: Confusion  Goal: Confusion, delirium, dementia, or psychosis is improved or at baseline  Description: INTERVENTIONS:  1. Assess for possible contributors to thought disturbance, including medications, impaired vision or hearing, underlying metabolic abnormalities, dehydration, psychiatric diagnoses, and notify attending LIP  2. Institute high risk fall precautions, as indicated  3. Provide frequent short contacts to provide reality reorientation, refocusing and direction  4. Decrease environmental stimuli, including noise as appropriate  5. Monitor and intervene to maintain adequate nutrition, hydration, elimination,  sleep and activity  6. If unable to ensure safety without constant attention obtain sitter and review sitter guidelines with assigned personnel  7. Initiate Psychosocial CNS and Spiritual Care  consult, as indicated  Outcome: Progressing     Problem: Pain  Goal: Verbalizes/displays adequate comfort level or baseline comfort level  Outcome: Progressing  Flowsheets (Taken 10/16/2024 2042)  Verbalizes/displays adequate comfort level or baseline comfort level: Encourage patient to monitor pain and request assistance     Problem: Safety - Medical Restraint  Goal: Remains free of injury from restraints (Restraint for Interference with Medical Device)  Description: INTERVENTIONS:  1. Determine that other, less restrictive measures have been tried or would not be effective before applying the restraint  2. Evaluate the patient's condition at the time of restraint application  3. Inform patient/family regarding the reason for restraint  4. Q2H: Monitor safety, psychosocial status, comfort, nutrition and hydration  Outcome: Progressing     "

## 2024-10-17 NOTE — Care Coordination (Signed)
"  10/17/2024  11:05 AM  Care Management Progress Note    Reason for Admission:   Hypoxemia [R09.02]  Severe sepsis (HCC) [A41.9, R65.20]  Community acquired pneumonia, unspecified laterality [J18.9]         Patient Admission Status: Inpatient  Date Admitted to INP: 10/11/24  [] NA - OBS/Outpatient  RUR: Readmission Risk Score: 18.8    Hospitalization in the last 30 days (Readmission):  No        Transition of care plan:  Awaiting medical clearance and dc order. Therapy following. Pulm following. Pt requiring 5L O2.   IPR - CM received p/c from pt's DTR, [name], who reported pt and family are requesting inpatient rehab. Preferences indicated as [facility] and [facility]. Referrals submitted via Careport, and awaiting responses. Auth not required.   SNF - If IPR denies, [facility] accepts.   Date IM given: 10/15/24  [] NA  Outpatient follow-up.  Discharge transport: BLS transport.    "

## 2024-10-18 LAB — POCT GLUCOSE
POC Glucose: 146 mg/dL — ABNORMAL HIGH (ref 65–117)
POC Glucose: 179 mg/dL — ABNORMAL HIGH (ref 65–117)
POC Glucose: 207 mg/dL — ABNORMAL HIGH (ref 65–117)
POC Glucose: 198 mg/dL — ABNORMAL HIGH (ref 65–117)

## 2024-10-18 LAB — CBC
Hematocrit: 30.3 % — ABNORMAL LOW (ref 35.0–47.0)
Hemoglobin: 9.9 g/dL — ABNORMAL LOW (ref 11.5–16.0)
MCH: 26.8 pg (ref 26.0–34.0)
MCHC: 32.7 g/dL (ref 30.0–36.5)
MCV: 82.1 FL (ref 80.0–99.0)
MPV: 9.4 FL (ref 8.9–12.9)
Nucleated RBCs: 0 /100{WBCs}
Platelets: 483 K/uL — ABNORMAL HIGH (ref 150–400)
RBC: 3.69 M/uL — ABNORMAL LOW (ref 3.80–5.20)
RDW: 12.3 % (ref 11.5–14.5)
WBC: 12.6 K/uL — ABNORMAL HIGH (ref 3.6–11.0)
nRBC: 0 K/uL (ref 0.00–0.01)

## 2024-10-18 LAB — EKG 12-LEAD
Atrial Rate: 81 {beats}/min
Diagnosis: NORMAL
P Axis: 36 degrees
P-R Interval: 132 ms
Q-T Interval: 358 ms
QRS Duration: 68 ms
QTc Calculation (Bazett): 415 ms
R Axis: -3 degrees
T Axis: -15 degrees
Ventricular Rate: 81 {beats}/min

## 2024-10-18 LAB — BASIC METABOLIC PANEL
Anion Gap: 7 mmol/L (ref 2–14)
BUN/Creatinine Ratio: 37 — ABNORMAL HIGH (ref 12–20)
BUN: 23 mg/dL (ref 8–23)
CO2: 31 mmol/L — ABNORMAL HIGH (ref 20–29)
Calcium: 10.1 mg/dL — ABNORMAL HIGH (ref 8.2–9.6)
Chloride: 105 mmol/L (ref 98–107)
Creatinine: 0.61 mg/dL (ref 0.60–1.00)
Est, Glom Filt Rate: 82 ml/min/1.73m2 (ref >59)
Glucose: 186 mg/dL — ABNORMAL HIGH (ref 65–100)
Potassium: 3.9 mmol/L (ref 3.5–5.1)
Sodium: 142 mmol/L (ref 136–145)

## 2024-10-18 LAB — PHOSPHORUS: Phosphorus: 2.7 mg/dL (ref 2.5–4.5)

## 2024-10-18 LAB — AMMONIA: Ammonia: 25 umol/L (ref 11–51)

## 2024-10-18 LAB — C-REACTIVE PROTEIN: CRP: 1.5 mg/dL — ABNORMAL HIGH (ref 0.0–0.5)

## 2024-10-18 LAB — BRAIN NATRIURETIC PEPTIDE: NT Pro-BNP: 1186 pg/mL — ABNORMAL HIGH (ref 0–450)

## 2024-10-18 LAB — MAGNESIUM: Magnesium: 1.8 mg/dL (ref 1.7–2.3)

## 2024-10-18 LAB — TSH: TSH, 3rd Generation: 0.111 u[IU]/mL — ABNORMAL LOW (ref 0.270–4.200)

## 2024-10-18 MED ORDER — LACTATED RINGERS IV SOLN
INTRAVENOUS | Status: DC
Start: 2024-10-18 — End: 2024-10-19
  Administered 2024-10-18: 20:00:00 via INTRAVENOUS

## 2024-10-18 MED ORDER — NYSTATIN 100000 UNIT/ML MT SUSP
100000 | Freq: Four times a day (QID) | OROMUCOSAL | Status: DC
Start: 2024-10-18 — End: 2024-10-18
  Administered 2024-10-18: 19:00:00 500000 mL via ORAL

## 2024-10-18 MED ORDER — IRON SUCROSE 20 MG/ML IV SOLN
20 | Freq: Once | INTRAVENOUS | Status: AC
Start: 2024-10-18 — End: 2024-10-18
  Administered 2024-10-18: 14:00:00 200 mg via INTRAVENOUS

## 2024-10-18 MED ORDER — SODIUM CHLORIDE (PF) 0.9 % IJ SOLN
0.9 | Freq: Every day | INTRAMUSCULAR | Status: DC
Start: 2024-10-18 — End: 2024-10-20
  Administered 2024-10-18 – 2024-10-20 (×3): 20 mg via INTRAVENOUS

## 2024-10-18 MED FILL — INSULIN LISPRO 100 UNIT/ML IJ SOLN: 100 [IU]/mL | INTRAMUSCULAR | Qty: 2 | Fill #0

## 2024-10-18 MED FILL — AMPICILLIN-SULBACTAM SODIUM 3 (2-1) G IJ SOLR: 3 (2-1) g | INTRAMUSCULAR | Qty: 3000 | Fill #0

## 2024-10-18 MED FILL — CARVEDILOL 3.125 MG PO TABS: 3.125 mg | ORAL | Qty: 1 | Fill #0

## 2024-10-18 MED FILL — IPRATROPIUM-ALBUTEROL 0.5-2.5 (3) MG/3ML IN SOLN: 0.5-2.5 (3) MG/3ML | RESPIRATORY_TRACT | Qty: 3 | Fill #0

## 2024-10-18 MED FILL — LANTUS 100 UNIT/ML SC SOLN: 100 [IU]/mL | SUBCUTANEOUS | Qty: 5 | Fill #0

## 2024-10-18 MED FILL — METHYLPREDNISOLONE SODIUM SUCC 40 MG IJ SOLR: 40 mg | INTRAMUSCULAR | Qty: 40 | Fill #0

## 2024-10-18 MED FILL — FAMOTIDINE (PF) 20 MG/2 ML IV SOLN: 20 MG/2ML | INTRAVENOUS | Qty: 2 | Fill #0

## 2024-10-18 MED FILL — LOSARTAN POTASSIUM 50 MG PO TABS: 50 mg | ORAL | Qty: 2 | Fill #0

## 2024-10-18 MED FILL — POLYETHYLENE GLYCOL 3350 17 G PO PACK: 17 g | ORAL | Qty: 1 | Fill #0

## 2024-10-18 MED FILL — ENOXAPARIN SODIUM 30 MG/0.3ML IJ SOSY: 30 MG/0.3ML | INTRAMUSCULAR | Qty: 0.3 | Fill #0

## 2024-10-18 MED FILL — VENOFER 20 MG/ML IV SOLN: 20 mg/mL | INTRAVENOUS | Qty: 10 | Fill #0

## 2024-10-18 MED FILL — ATORVASTATIN CALCIUM 10 MG PO TABS: 10 mg | ORAL | Qty: 1 | Fill #0

## 2024-10-18 MED FILL — CULTURELLE IMMUNITY SUPPORT PO CAPS: ORAL | Qty: 1 | Fill #0

## 2024-10-18 MED FILL — NYSTATIN 100000 UNIT/ML MT SUSP: 100000 [IU]/mL | OROMUCOSAL | Qty: 5 | Fill #0

## 2024-10-18 MED FILL — TROSPIUM CHLORIDE 20 MG PO TABS: 20 mg | ORAL | Qty: 1 | Fill #0

## 2024-10-18 MED FILL — MELATONIN 3 MG PO TABS: 3 mg | ORAL | Qty: 2 | Fill #0

## 2024-10-18 MED FILL — LACTATED RINGERS IV SOLN: INTRAVENOUS | Qty: 1000 | Fill #0

## 2024-10-18 MED FILL — METAMUCIL 4 IN 1 FIBER 51.7 % PO PACK: 51.7 % | ORAL | Qty: 1 | Fill #0

## 2024-10-18 NOTE — Plan of Care (Signed)
 "  Problem: SLP Adult - Impaired Swallowing  Goal: By Discharge: Advance to least restrictive diet without signs or symptoms of aspiration for planned discharge setting.  See evaluation for individualized goals.  Note: Evaluation 10/18/24:  Pt will tolerate the least restrictive diet without overt s/s of aspiration or changes in respiratory status within 1 week.      Speech LAnguage Pathology EVALUATION    Patient: Chelsea Schultz (89 y.o. female)  Date: 10/18/2024  Primary Diagnosis: Hypoxemia [R09.02]  Severe sepsis (HCC) [A41.9, R65.20]  Community acquired pneumonia, unspecified laterality [J18.9]       Precautions:                     ASSESSMENT :  Pt is a 89 yo female admitted with severe sepsis (+uti, dementia) with AMS, in acute respiratory distress with slow improvement (5L O2 via NC), new pulmonary fibrosis diagnosis and overload with possible PNA. MBS completed 10/26/23 at [facility] with trace penetration with thin liquids then cleared recommending a regular diet and thin liquids. CXR: 1. Persistent low lung volumes with findings suggesting pulmonary fibrosis 2. There is probably pulmonary edema superimposed upon the pulmonary fibrosis which has developed when compared to 10/10/2024.     Pt with weak OME and white coating on the tongue consistent with oral thrush (MD placed order). Pt observed with thin liquids via straw, mildly thick liquids via straw and applesauce. She required 1:1 assistance with feeding, functional bolus acceptance, slow propulsion without residue. Pt with throat clear and delayed cough with thin liquids despite volume. No s/s of aspiration with mildly thick liquids. Additionally, pt with c/o globus sensation followed by belching. Spoke with MD and adding an additional reflux medication to assist.     Given above, pt with significant weakness and would benefit from a soft and bite sized diet with mildly thick liquids with strict aspiration and reflux precautions. She has minimal to no  appetite and suspect her swallow will mirror her overall debility.     Patient will benefit from skilled intervention to address the above impairments.     PLAN :  Recommendations and Planned Interventions:  Diet: Soft and bite sized and mildly thick liquids  -upright for all po intake  -remain upright for at least 30 minutes after po intake  -small sips from straw ok  -adding Ensure as needed  -assistance with meals   -meds with applesauce or puree as needed    Acute SLP Services: SLP Plan of Care: 2 times/week. Patient's rehabilitation potential is considered to be Guarded.  Discharge Recommendations: Continue to assess pending progress     SUBJECTIVE:   Patient stated, hello.    OBJECTIVE:     Past Medical History:   Diagnosis Date    Arthritis     CVA (cerebral infarction)     Diabetes (HCC) 01/09/2015    Hemoglobin A1C 6.4 (per records, done at outside facility)    GERD (gastroesophageal reflux disease)     History of recurrent UTIs     on chronic antibiotic prophylaxis, followed by urology    HLD (hyperlipidemia) 01/09/2015    Total Chol 141, Tri 49, HDL 59, LDL 72    Hypertension     Neuropathy     Osteopetrosis     Spinal stenosis      Past Surgical History:   Procedure Laterality Date    ORTHOPEDIC SURGERY      Lumbar laminectomy    ORTHOPEDIC SURGERY  Lumbar fusion     Prior Level of Function/Home Situation:   Social/Functional History  Lives With: Daughter  Type of Home: House  Home Layout: One level  Home Access: Ramped entrance  Bathroom Shower/Tub: Cabin Crew: Tub transfer bench, Grab bars around toilet  Home Equipment: Hospital bed, Wheelchair - Manual, Environmental Consultant - Doctor, General Practice Help From: Family, Personal care attendant  Prior Level of Assist for ADLs: Needs assistance  Toileting: Needs assistance  Prior Level of Assist for Homemaking: Needs assistance  Homemaking Responsibilities: No  Prior Level of Assist for Ambulation: Needs assistance (Stand pivot transfer to  wheelchair)  Prior Level of Assist for Transfers: Needs assistance  Active Driver: No  Mode of Transportation: Car    Baseline Assessment:  Cognitive and Communication Status:  Neurologic State: Lethargic  Orientation Level: Oriented to person  Cognition: Follows commands    Dysphagia:  Oral Assessment:  Oral/Motor  Oral Hygiene: Moist;UTA (comment) (consistent with oral thrush)  Dentition: Full  Baseline Vocal Quality: Weak  Volitional Cough: Weak  Baseline Vocal Quality: Weak  Volitional Cough: Weak     P.O. Trials:  PO Trials  Neuromuscular Estim Used: No  Assessment Method(s): Observation  Patient Position: upright in bed  Consistency Presented: Pureed;Thin;Mildly Thick  How Presented: SLP-fed/Presented;Spoon;Straw;Successive Swallows  Bolus Acceptance: No impairment  Bolus Formation/Control: Impaired  Type of Impairment: Delayed  Propulsion: Delayed (# of seconds)  Oral Residue: None  Initiation of Swallow: Delayed (# of seconds)  Laryngeal Elevation: Weak  Aspiration Signs/Symptoms:  (throat clear and delayed cough with thin liquids; no s/s of aspiration with mildly thick)  Pharyngeal Phase Characteristics: Double swallow (globus sensation ; belching)    Respiratory Status/Airway:  Nasal cannula, 5LPM     Outcome Measure:  Functional Oral Intake Scale (FOIS): 5--Total oral diet with multiple consistencies but requiring special preparation or compensations    After treatment:   Patient left in no apparent distress in bed, Call bell left within reach, Nursing notified, and Caregiver present    COMMUNICATION/EDUCATION:   Patient was educated regarding swallowing.  She demonstrated Guarded understanding as evidenced by Teachback method and Nodding. Daughter present at bedside and verbalized understanding to education provided.     The patient's plan of care including recommendations, planned interventions, and recommended diet changes were discussed with: Registered nurse and Physician    Patient/family have  participated as able in goal setting and plan of care    Thank you,  Peyton Squibb, SLP    SLP Individual Minutes  Time In: 1008  Time Out: 1031  Minutes: 23    "

## 2024-10-18 NOTE — Progress Notes (Signed)
"  Pharmacy Dosing    Pepcid  dose changed from 20 mg twice daily to 20 mg daily per protocol.  - CrCl 38 ml/min    Thank you, Jannett Blanch PharmD (x [phone])    "

## 2024-10-18 NOTE — Plan of Care (Signed)
 "  Problem: Skin/Tissue Integrity  Goal: Skin integrity remains intact  Description: 1.  Monitor for areas of redness and/or skin breakdown  2.  Assess vascular access sites hourly  3.  Every 4-6 hours minimum:  Change oxygen saturation probe site  4.  Every 4-6 hours:  If on nasal continuous positive airway pressure, respiratory therapy assess nares and determine need for appliance change or resting period  10/18/2024 0340 by Marea Ingle, RN  Outcome: Progressing  Flowsheets (Taken 10/17/2024 2100)  Skin Integrity Remains Intact: Monitor for areas of redness and/or skin breakdown  10/17/2024 1956 by Stephane Iha, RN  Outcome: Progressing     Problem: Safety - Adult  Goal: Free from fall injury  10/18/2024 0340 by Marea Ingle, RN  Outcome: Progressing  10/17/2024 1955 by Stephane Iha, RN  Outcome: Progressing  10/17/2024 1952 by Stephane Iha, RN  Outcome: Progressing     Problem: Chronic Conditions and Co-morbidities  Goal: Patient's chronic conditions and co-morbidity symptoms are monitored and maintained or improved  Outcome: Progressing     Problem: Discharge Planning  Goal: Discharge to home or other facility with appropriate resources  Outcome: Progressing  Flowsheets (Taken 10/17/2024 2100)  Discharge to home or other facility with appropriate resources: Identify barriers to discharge with patient and caregiver     Problem: Respiratory - Adult  Goal: Achieves optimal ventilation and oxygenation  10/18/2024 0340 by Marea Ingle, RN  Outcome: Progressing  10/17/2024 1930 by Meliton Irving, RCP  Outcome: Progressing  10/17/2024 1405 by Morton Almarie MATSU, RT  Outcome: Progressing     Problem: Confusion  Goal: Confusion, delirium, dementia, or psychosis is improved or at baseline  Description: INTERVENTIONS:  1. Assess for possible contributors to thought disturbance, including medications, impaired vision or hearing, underlying metabolic abnormalities, dehydration, psychiatric diagnoses, and  notify attending LIP  2. Institute high risk fall precautions, as indicated  3. Provide frequent short contacts to provide reality reorientation, refocusing and direction  4. Decrease environmental stimuli, including noise as appropriate  5. Monitor and intervene to maintain adequate nutrition, hydration, elimination, sleep and activity  6. If unable to ensure safety without constant attention obtain sitter and review sitter guidelines with assigned personnel  7. Initiate Psychosocial CNS and Spiritual Care consult, as indicated  Outcome: Progressing     Problem: Pain  Goal: Verbalizes/displays adequate comfort level or baseline comfort level  Outcome: Progressing     Problem: Neurosensory - Adult  Goal: Achieves stable or improved neurological status  Outcome: Progressing  Flowsheets (Taken 10/17/2024 2100 by Karlynn Ruder, RN)  Achieves stable or improved neurological status: Assess for and report changes in neurological status  Goal: Achieves maximal functionality and self care  Outcome: Progressing  Flowsheets (Taken 10/17/2024 2100 by Karlynn Ruder, RN)  Achieves maximal functionality and self care: Monitor swallowing and airway patency with patient fatigue and changes in neurological status     Problem: Cardiovascular - Adult  Goal: Maintains optimal cardiac output and hemodynamic stability  Outcome: Progressing  Flowsheets (Taken 10/17/2024 2100)  Maintains optimal cardiac output and hemodynamic stability: Monitor blood pressure and heart rate  Goal: Absence of cardiac dysrhythmias or at baseline  Outcome: Progressing  Flowsheets (Taken 10/17/2024 2100)  Absence of cardiac dysrhythmias or at baseline: Monitor cardiac rate and rhythm     Problem: Skin/Tissue Integrity - Adult  Goal: Skin integrity remains intact  Description: 1.  Monitor for areas of redness and/or skin breakdown  2.  Assess vascular access sites hourly  3.  Every 4-6 hours minimum:  Change oxygen saturation probe site  4.  Every 4-6  hours:  If on nasal continuous positive airway pressure, respiratory therapy assess nares and determine need for appliance change or resting period  10/18/2024 0340 by Marea Ingle, RN  Outcome: Progressing  Flowsheets (Taken 10/17/2024 2100)  Skin Integrity Remains Intact: Monitor for areas of redness and/or skin breakdown  10/17/2024 1956 by Stephane Iha, RN  Outcome: Progressing  Goal: Incisions, wounds, or drain sites healing without S/S of infection  Outcome: Progressing  Goal: Oral mucous membranes remain intact  Outcome: Progressing     Problem: Musculoskeletal - Adult  Goal: Return mobility to safest level of function  Outcome: Progressing  Goal: Maintain proper alignment of affected body part  Outcome: Progressing  Goal: Return ADL status to a safe level of function  Outcome: Progressing     Problem: Gastrointestinal - Adult  Goal: Minimal or absence of nausea and vomiting  Outcome: Progressing  Goal: Maintains or returns to baseline bowel function  Outcome: Progressing  Goal: Maintains adequate nutritional intake  Outcome: Progressing     Problem: Genitourinary - Adult  Goal: Absence of urinary retention  Outcome: Progressing     Problem: Infection - Adult  Goal: Absence of infection at discharge  Outcome: Progressing  Goal: Absence of infection during hospitalization  Outcome: Progressing     Problem: Metabolic/Fluid and Electrolytes - Adult  Goal: Electrolytes maintained within normal limits  Outcome: Progressing  Goal: Hemodynamic stability and optimal renal function maintained  Outcome: Progressing  Goal: Glucose maintained within prescribed range  Outcome: Progressing     Problem: Hematologic - Adult  Goal: Maintains hematologic stability  Outcome: Progressing     Problem: Safety - Medical Restraint  Goal: Remains free of injury from restraints (Restraint for Interference with Medical Device)  Description: INTERVENTIONS:  1. Determine that other, less restrictive measures have been tried or  would not be effective before applying the restraint  2. Evaluate the patient's condition at the time of restraint application  3. Inform patient/family regarding the reason for restraint  4. Q2H: Monitor safety, psychosocial status, comfort, nutrition and hydration  Outcome: Progressing     "

## 2024-10-18 NOTE — Progress Notes (Signed)
 " [facility]  [address] [place], [place] [address]  [phone]        Hospitalist Progress Note      NAME: Chelsea Schultz   DOB:  [birth year]  MRM:  [id number]    Date/Time of service: 10/18/2024  10:34 AM       Subjective:     Chief Complaint:  Patient was personally seen and examined by me during this time period.  Chart reviewed.  Still lethargic but arousable to sound.  Daughter at bedside       Objective:       Vitals:       Last 24hrs VS reviewed since prior progress note. Most recent are:    Vitals:    10/18/24 0754   BP:    Pulse:    Resp:    Temp:    SpO2: 92%     SpO2 Readings from Last 6 Encounters:   10/18/24 92%   08/19/24 96%   06/26/23 97%   09/05/22 96%   06/19/22 96%   05/11/22 94%          Intake/Output Summary (Last 24 hours) at 10/18/2024 1034  Last data filed at 10/18/2024 0631  Gross per 24 hour   Intake --   Output 1600 ml   Net -1600 ml        Exam:     Physical Exam:    Gen:  elderly, ill-appearing, mild lethargy but arousable  HEENT:  Pink conjunctivae, PERRL, hearing intact to voice, dry mucous membranes  Neck:  Supple, without masses  Resp:  No accessory muscle use, clear breath sounds without wheezes rales or rhonchi  Card:  No murmurs, normal S1, S2 without thrills, bruits or peripheral edema  Abd:  Soft, non-tender, non-distended, normoactive bowel sounds are present  Musc:  No cyanosis or clubbing  Skin:  No rashes  Neuro:  moves all ext, followed some commands  Psych:  no insight    Medications Reviewed: (see below)    Lab Data Reviewed: (see below)    ______________________________________________________________________    Medications:     Current Facility-Administered Medications   Medication Dose Route Frequency    famotidine  (PEPCID ) 20 MG/2ML 20 mg in sodium chloride  (PF) 0.9 % 10 mL injection  20 mg IntraVENous BID    nystatin (MYCOSTATIN) 100000 UNIT/ML suspension 500,000 Units  5 mL Oral 4x Daily    ketorolac (TORADOL) injection 15 mg   15 mg IntraVENous Q6H PRN    lactated ringers infusion   IntraVENous Continuous    ipratropium 0.5 mg-albuterol  2.5 mg (DUONEB ) nebulizer solution 1 Dose  1 Dose Inhalation Q6H WA RT    trospium  (SANCTURA ) tablet 20 mg  20 mg Oral Daily    lactobacillus (CULTURELLE) capsule 1 capsule  1 capsule Oral Daily with breakfast    insulin  glargine (LANTUS ) injection vial 5 Units  5 Units SubCUTAneous Nightly    glucose chewable tablet 16 g  4 tablet Oral PRN    dextrose  bolus 10% 125 mL  125 mL IntraVENous PRN    Or    dextrose  bolus 10% 250 mL  250 mL IntraVENous PRN    glucagon  injection 1 mg  1 mg SubCUTAneous PRN    dextrose  10 % infusion   IntraVENous Continuous PRN    insulin  lispro (HUMALOG ,ADMELOG ) injection vial 0-8 Units  0-8 Units SubCUTAneous 4x Daily AC & HS    ampicillin -sulbactam (UNASYN ) 3,000 mg in sodium  chloride 0.9 % 100 mL IVPB (addEASE)  3,000 mg IntraVENous Q6H    methylPREDNISolone  sodium succ (SOLU-MEDROL ) 40 mg in sterile water  1 mL injection  40 mg IntraVENous Q8H    psyllium husk-aspartame (METAMUCIL FIBER) packet 1 packet  1 packet Oral BID    polyethylene glycol (GLYCOLAX ) packet 17 g  17 g Oral Daily    QUEtiapine  (SEROQUEL ) tablet 25 mg  25 mg Oral Q6H PRN    ipratropium 0.5 mg-albuterol  2.5 mg (DUONEB ) nebulizer solution 1 Dose  1 Dose Inhalation Q4H PRN    prochlorperazine  (COMPAZINE ) injection 10 mg  10 mg IntraVENous Q6H PRN    sodium chloride  flush 0.9 % injection 5-40 mL  5-40 mL IntraVENous 2 times per day    sodium chloride  flush 0.9 % injection 5-40 mL  5-40 mL IntraVENous PRN    0.9 % sodium chloride  infusion   IntraVENous PRN    enoxaparin  Sodium (LOVENOX ) injection 30 mg  30 mg SubCUTAneous Daily    acetaminophen  (TYLENOL ) tablet 650 mg  650 mg Oral Q6H PRN    Or    acetaminophen  (TYLENOL ) suppository 650 mg  650 mg Rectal Q6H PRN    melatonin tablet 6 mg  6 mg Oral Nightly PRN    bisacodyl  (DULCOLAX) EC tablet 5 mg  5 mg Oral Daily PRN    atorvastatin  (LIPITOR ) tablet 10 mg  10  mg Oral Daily    carvedilol  (COREG ) tablet 3.125 mg  3.125 mg Oral BID    losartan  (COZAAR ) tablet 100 mg  100 mg Oral Daily    albuterol  (PROVENTIL ) (2.5 MG/3ML) 0.083% nebulizer solution 2.5 mg  2.5 mg Nebulization Q4H PRN    benzonatate  (TESSALON ) capsule 100 mg  100 mg Oral TID PRN          Lab Review:     Recent Labs     10/16/24  1003 10/17/24  0509 10/18/24  0212   WBC 19.1* 15.3* 12.6*   HGB 9.8* 9.9* 9.9*   HCT 30.8* 31.3* 30.3*   PLT 504* 485* 483*     Recent Labs     10/17/24  0509 10/18/24  0212   NA 139 142   K 4.1 3.9   CL 104 105   CO2 28 31*   BUN 20 23   MG 2.0 1.8   PHOS 3.1 2.7     No results found for: GLUCPOC       Assessment / Plan:     89 yo Hx of HTN, DM, interstitial lung dz, dementia, presented w/ AMS, resp failure, hypoxia, pulm fibrosis, enterococcus UTI    1) Acute resp failure/hypoxia: slow to improve.  Still on 5L O2.  Likely due to new pulmonary fibrosis with possible pneumonia.  Poor prognosis.  Cont O2, IV abx, IV steroids.  Pulm following    2) Pneumonia: CAP vs aspiration.  Cont empiric IV Unasyn .  Speech following    3) Acute met encephalopathy: due to hypoxia, UTI, with underlying dementia.  Poor prognosis.  Head CT and ammonia unremarkable.  Will monitor for improvement    4) Pleuritic chest pain: due to pulm fibrosis.  EKG non-ischemic.  Will use IV toradol prn pain    5) UTI: Ucx w/ enterococcus.  Cont IV Unasyn     6) Hypercalcemia: likely from dehydration.  Cont IVF, monitor     7) DM type 2: A1C 7.0%.  Cont lantus , SSI    8) HTN: cont norvasc , coreg , losartan   9) Abnormal thyroid function: TSH 0.111.  will check free T4    10) Dementia: cont supportive care.  Might need hospice.  Patient has 6 children.  I've asked family to get together to discuss goals of care and possible hospice.  Can set up a family meeting on Monday with case managers    Code: DNR    **Prior records, notes, labs, radiology, and medications reviewed in [facility]**    Total time spent with  patient care: 45 Minutes                  **I personally saw and examined the patient during this time period**                 Care Plan discussed with: Patient, nursing, family     Discussed:  Care Plan    Prophylaxis:  Lovenox     Disposition:  SNF/LTC vs hospice           ___________________________________________________    Attending Physician: Rodrick Has, MD     "

## 2024-10-18 NOTE — Progress Notes (Signed)
"  Patient to be transferred to ICU Bed 479 per provider orders. Report called to [facility] RN informed of patient history and current condition. Patient transported to room 479 with her perosnl belongings.   "

## 2024-10-19 ENCOUNTER — Inpatient Hospital Stay: Admit: 2024-10-19 | Payer: MEDICARE

## 2024-10-19 LAB — T4, FREE: T4 Free: 1 ng/dL (ref 0.9–1.6)

## 2024-10-19 LAB — BASIC METABOLIC PANEL
Anion Gap: 4 mmol/L (ref 2–14)
BUN/Creatinine Ratio: 40 — ABNORMAL HIGH (ref 12–20)
BUN: 21 mg/dL (ref 8–23)
CO2: 32 mmol/L — ABNORMAL HIGH (ref 20–29)
Calcium: 10 mg/dL — ABNORMAL HIGH (ref 8.2–9.6)
Chloride: 103 mmol/L (ref 98–107)
Creatinine: 0.52 mg/dL — ABNORMAL LOW (ref 0.60–1.00)
Est, Glom Filt Rate: 85 ml/min/1.73m2 (ref >59)
Glucose: 118 mg/dL — ABNORMAL HIGH (ref 65–100)
Potassium: 4.1 mmol/L (ref 3.5–5.1)
Sodium: 139 mmol/L (ref 136–145)

## 2024-10-19 LAB — CBC
Hematocrit: 32.4 % — ABNORMAL LOW (ref 35.0–47.0)
Hemoglobin: 10.4 g/dL — ABNORMAL LOW (ref 11.5–16.0)
MCH: 26.9 pg (ref 26.0–34.0)
MCHC: 32.1 g/dL (ref 30.0–36.5)
MCV: 83.9 FL (ref 80.0–99.0)
MPV: 9.7 FL (ref 8.9–12.9)
Nucleated RBCs: 0 /100{WBCs}
Platelets: 512 K/uL — ABNORMAL HIGH (ref 150–400)
RBC: 3.86 M/uL (ref 3.80–5.20)
RDW: 12.6 % (ref 11.5–14.5)
WBC: 16.6 K/uL — ABNORMAL HIGH (ref 3.6–11.0)
nRBC: 0 K/uL (ref 0.00–0.01)

## 2024-10-19 LAB — POCT GLUCOSE
POC Glucose: 197 mg/dL — ABNORMAL HIGH (ref 65–117)
POC Glucose: 121 mg/dL — ABNORMAL HIGH (ref 65–117)
POC Glucose: 144 mg/dL — ABNORMAL HIGH (ref 65–117)
POC Glucose: 170 mg/dL — ABNORMAL HIGH (ref 65–117)

## 2024-10-19 LAB — BRAIN NATRIURETIC PEPTIDE: NT Pro-BNP: 1159 pg/mL — ABNORMAL HIGH (ref 0–450)

## 2024-10-19 LAB — PHOSPHORUS: Phosphorus: 2.2 mg/dL — ABNORMAL LOW (ref 2.5–4.5)

## 2024-10-19 LAB — MAGNESIUM: Magnesium: 1.9 mg/dL (ref 1.7–2.3)

## 2024-10-19 LAB — C-REACTIVE PROTEIN: CRP: 0.8 mg/dL — ABNORMAL HIGH (ref 0.0–0.5)

## 2024-10-19 MED ORDER — HYDRALAZINE HCL 20 MG/ML IJ SOLN
20 | Freq: Four times a day (QID) | INTRAMUSCULAR | Status: DC | PRN
Start: 2024-10-19 — End: 2024-10-20

## 2024-10-19 MED ORDER — DIAZEPAM 5 MG/ML IJ SOLN
5 | Freq: Three times a day (TID) | INTRAMUSCULAR | Status: DC | PRN
Start: 2024-10-19 — End: 2024-10-19

## 2024-10-19 MED ORDER — METHYLPREDNISOLONE SODIUM SUCC 40 MG IJ SOLR
40 | Freq: Two times a day (BID) | INTRAMUSCULAR | Status: DC
Start: 2024-10-19 — End: 2024-10-20
  Administered 2024-10-19 – 2024-10-20 (×2): 40 mg via INTRAVENOUS

## 2024-10-19 MED ORDER — DIAZEPAM 5 MG/ML IJ SOLN
5 | Freq: Once | INTRAMUSCULAR | Status: AC
Start: 2024-10-19 — End: 2024-10-18
  Administered 2024-10-19: 02:00:00 2.5 mg via INTRAVENOUS

## 2024-10-19 MED ORDER — MORPHINE SULFATE (PF) 4 MG/ML IJ SOLN
4 | INTRAMUSCULAR | Status: DC | PRN
Start: 2024-10-19 — End: 2024-10-19

## 2024-10-19 MED ORDER — LACTATED RINGERS IV SOLN
INTRAVENOUS | Status: DC
Start: 2024-10-19 — End: 2024-10-19
  Administered 2024-10-19 (×2): via INTRAVENOUS

## 2024-10-19 MED ORDER — INFUVITE ADULT IV SOLN
5 | Freq: Once | INTRAVENOUS | Status: AC
Start: 2024-10-19 — End: 2024-10-19
  Administered 2024-10-19: 20:00:00 via INTRAVENOUS

## 2024-10-19 MED ORDER — INSULIN LISPRO 100 UNIT/ML IJ SOLN
100 | Freq: Four times a day (QID) | INTRAMUSCULAR | Status: DC
Start: 2024-10-19 — End: 2024-10-20
  Administered 2024-10-20: 17:00:00 2 [IU] via SUBCUTANEOUS

## 2024-10-19 MED FILL — AMPICILLIN-SULBACTAM SODIUM 3 (2-1) G IJ SOLR: 3 (2-1) g | INTRAMUSCULAR | Qty: 3000 | Fill #0

## 2024-10-19 MED FILL — KETOROLAC TROMETHAMINE 15 MG/ML IJ SOLN: 15 mg/mL | INTRAMUSCULAR | Qty: 1 | Fill #0

## 2024-10-19 MED FILL — INSULIN LISPRO 100 UNIT/ML IJ SOLN: 100 [IU]/mL | INTRAMUSCULAR | Qty: 2 | Fill #0

## 2024-10-19 MED FILL — FOLIC ACID 5 MG/ML IJ SOLN: 5 mg/mL | INTRAMUSCULAR | Qty: 0.2 | Fill #0

## 2024-10-19 MED FILL — LACTATED RINGERS IV SOLN: INTRAVENOUS | Qty: 1000 | Fill #0

## 2024-10-19 MED FILL — METHYLPREDNISOLONE SODIUM SUCC 40 MG IJ SOLR: 40 mg | INTRAMUSCULAR | Qty: 40 | Fill #0

## 2024-10-19 MED FILL — FAMOTIDINE (PF) 20 MG/2 ML IV SOLN: 20 MG/2ML | INTRAVENOUS | Qty: 2 | Fill #0

## 2024-10-19 MED FILL — CARVEDILOL 3.125 MG PO TABS: 3.125 mg | ORAL | Qty: 1 | Fill #0

## 2024-10-19 MED FILL — DIAZEPAM 5 MG/ML IJ SOLN: 5 mg/mL | INTRAMUSCULAR | Qty: 2 | Fill #0

## 2024-10-19 MED FILL — ENOXAPARIN SODIUM 30 MG/0.3ML IJ SOSY: 30 MG/0.3ML | INTRAMUSCULAR | Qty: 0.3 | Fill #0

## 2024-10-19 MED FILL — LANTUS 100 UNIT/ML SC SOLN: 100 [IU]/mL | SUBCUTANEOUS | Qty: 5 | Fill #0

## 2024-10-19 MED FILL — MELATONIN 3 MG PO TABS: 3 mg | ORAL | Qty: 2 | Fill #0

## 2024-10-19 NOTE — Progress Notes (Addendum)
"  Manya Notice Child [phone]  [phone]   Called [name]/Connie to further clarify questions.   "

## 2024-10-19 NOTE — Progress Notes (Addendum)
 "[facility]  [address] [place], [place] [address]  [phone]        Hospitalist Progress Note      NAME: Chelsea Schultz   DOB:  [birth year]  MRM:  [id number]    Date/Time of service: 10/19/2024  2:47 PM       Subjective:     Chief Complaint: Overnight after diet started pt was transferred to CC unit for closer monitoring as O2 requirements increased to 15L. Made NPO. Speech eval pending. Per RN, bedside swallow was quite poor.        Objective:       Vitals:       Last 24hrs VS reviewed since prior progress note. Most recent are:    Vitals:    10/19/24 1200   BP: (!) 156/73   Pulse: 75   Resp: 15   Temp:    SpO2: 96%     SpO2 Readings from Last 6 Encounters:   10/19/24 96%   08/19/24 96%   06/26/23 97%   09/05/22 96%   06/19/22 96%   05/11/22 94%          Intake/Output Summary (Last 24 hours) at 10/19/2024 1447  Last data filed at 10/19/2024 1200  Gross per 24 hour   Intake 2142.99 ml   Output 850 ml   Net 1292.99 ml        Exam:     Physical Exam:    Gen: frail elderly female.   HEENT:  Pink conjunctivae, PERRL, hearing intact to voice, dry mucous membranes  Neck:  Supple, without masses  Resp: coarse breath sounds in R lung field. Diffuse crackles in all lung fields   Card:  No murmurs, normal S1, S2 without thrills, bruits or peripheral edema  Abd:  Soft, non-tender, non-distended, normoactive bowel sounds are present  Musc:  No cyanosis or clubbing  Skin:  No rashes  Neuro:  moves all ext, followed some commands  Psych:  no insight    Medications Reviewed: (see below)    Lab Data Reviewed: (see below)    ______________________________________________________________________    Medications:     Current Facility-Administered Medications   Medication Dose Route Frequency    methylPREDNISolone  sodium succ (SOLU-MEDROL ) 40 mg in sterile water  1 mL injection  40 mg IntraVENous Q12H    sodium chloride  0.9 % 1,000 mL with folic acid 5 MG/ML 1 mg, adult multi-vitamin with vitamin  k 10 mL, thiamine 100 mg   IntraVENous Once    famotidine  (PEPCID ) 20 MG/2ML 20 mg in sodium chloride  (PF) 0.9 % 10 mL injection  20 mg IntraVENous Daily    ipratropium 0.5 mg-albuterol  2.5 mg (DUONEB ) nebulizer solution 1 Dose  1 Dose Inhalation Q6H WA RT    trospium  (SANCTURA ) tablet 20 mg  20 mg Oral Daily    lactobacillus (CULTURELLE) capsule 1 capsule  1 capsule Oral Daily with breakfast    insulin  glargine (LANTUS ) injection vial 5 Units  5 Units SubCUTAneous Nightly    glucose chewable tablet 16 g  4 tablet Oral PRN    dextrose  bolus 10% 125 mL  125 mL IntraVENous PRN    Or    dextrose  bolus 10% 250 mL  250 mL IntraVENous PRN    glucagon  injection 1 mg  1 mg SubCUTAneous PRN    dextrose  10 % infusion   IntraVENous Continuous PRN    insulin  lispro (HUMALOG ,ADMELOG ) injection vial 0-8 Units  0-8 Units  SubCUTAneous 4x Daily AC & HS    ampicillin -sulbactam (UNASYN ) 3,000 mg in sodium chloride  0.9 % 100 mL IVPB (addEASE)  3,000 mg IntraVENous Q6H    psyllium husk-aspartame (METAMUCIL FIBER) packet 1 packet  1 packet Oral BID    polyethylene glycol (GLYCOLAX ) packet 17 g  17 g Oral Daily    QUEtiapine  (SEROQUEL ) tablet 25 mg  25 mg Oral Q6H PRN    ipratropium 0.5 mg-albuterol  2.5 mg (DUONEB ) nebulizer solution 1 Dose  1 Dose Inhalation Q4H PRN    prochlorperazine  (COMPAZINE ) injection 10 mg  10 mg IntraVENous Q6H PRN    sodium chloride  flush 0.9 % injection 5-40 mL  5-40 mL IntraVENous 2 times per day    sodium chloride  flush 0.9 % injection 5-40 mL  5-40 mL IntraVENous PRN    0.9 % sodium chloride  infusion   IntraVENous PRN    enoxaparin  Sodium (LOVENOX ) injection 30 mg  30 mg SubCUTAneous Daily    acetaminophen  (TYLENOL ) tablet 650 mg  650 mg Oral Q6H PRN    Or    acetaminophen  (TYLENOL ) suppository 650 mg  650 mg Rectal Q6H PRN    melatonin tablet 6 mg  6 mg Oral Nightly PRN    bisacodyl  (DULCOLAX) EC tablet 5 mg  5 mg Oral Daily PRN    atorvastatin  (LIPITOR ) tablet 10 mg  10 mg Oral Daily    carvedilol  (COREG )  tablet 3.125 mg  3.125 mg Oral BID    losartan  (COZAAR ) tablet 100 mg  100 mg Oral Daily    albuterol  (PROVENTIL ) (2.5 MG/3ML) 0.083% nebulizer solution 2.5 mg  2.5 mg Nebulization Q4H PRN    benzonatate  (TESSALON ) capsule 100 mg  100 mg Oral TID PRN          Lab Review:     Recent Labs     10/17/24  0509 10/18/24  0212 10/19/24  0614   WBC 15.3* 12.6* 16.6*   HGB 9.9* 9.9* 10.4*   HCT 31.3* 30.3* 32.4*   PLT 485* 483* 512*     Recent Labs     10/17/24  0509 10/18/24  0212 10/19/24  0614   NA 139 142 139   K 4.1 3.9 4.1   CL 104 105 103   CO2 28 31* 32*   BUN 20 23 21    MG 2.0 1.8 1.9   PHOS 3.1 2.7 2.2*     No results found for: GLUCPOC       Assessment / Plan:   89 yo Hx of HTN, DM, interstitial lung dz, dementia, presented w/ AMS, resp failure, hypoxia, pulm fibrosis, enterococcus UTI    1) Acute resp failure/hypoxia: 2/2 pulmonary fibrosis, aspiration PNA/pneumonitis   -continue steroids; taper   -continue antibiotics  -NPO again as aspiration event after restarting diet   -continue goals of care discussions; at 89yo pt is frail with pulmonary fibrosis, recurrent aspiration events and dementia; very appropriate for hospice.     2) Pneumonia: due to location on imaging, suspect aspiration   -on unasyn     3) Acute met encephalopathy: due to hypoxia, UTI, with underlying dementia.  Head CT without acute process  -monitor     4) Pleuritic chest pain: possibly 2/2 PNA/pneumonitis. CTA without e/o PE. Resolved. Monitor     5) UTI: culture with Enterococcus   -on unasyn  as per above     6) Hypercalcemia: likely from dehydration  -PTH WNL   -check vitamin D and PTHrP  7) DM type 2: A1C 7.0%  -ISS   -BG checks AC q6h while NPO  -hold Lantus  while NPO    8) HTN: cont norvasc , coreg , losartan ; unable to take pills at this time   -PRN hydralazine     9) Abnormal thyroid function: free T4 WNL. TSH low. Subclinical hyperthyroidism. Repeat TFT's in 6 weeks.     10) Dementia:   -continue goals of care discussions     FULL  CODE     **Prior records, notes, labs, radiology, and medications reviewed in [facility]**    Total time spent with patient care: 67 Minutes                  **I personally saw and examined the patient during this time period**                 Care Plan discussed with: Patient, nursing, family     Discussed:  Care Plan    Prophylaxis:  Lovenox     Disposition: TBD           ___________________________________________________    Attending Physician: Consepcion Coombs, MD     "

## 2024-10-20 ENCOUNTER — Inpatient Hospital Stay: Admit: 2024-10-20 | Payer: MEDICARE

## 2024-10-20 LAB — VITAMIN D 25 HYDROXY: Vit D, 25-Hydroxy: 25.8 ng/mL — ABNORMAL LOW (ref 30.0–100.0)

## 2024-10-20 LAB — COMPREHENSIVE METABOLIC PANEL
ALT: 8 U/L — ABNORMAL LOW (ref 10–35)
ALT: 6 U/L — ABNORMAL LOW (ref 10–35)
AST: 49 U/L — ABNORMAL HIGH (ref 10–35)
AST: 16 U/L (ref 10–35)
Albumin/Globulin Ratio: 0.5 — ABNORMAL LOW (ref 1.1–2.2)
Albumin/Globulin Ratio: 0.6 — ABNORMAL LOW (ref 1.1–2.2)
Albumin: 2 g/dL — ABNORMAL LOW (ref 3.5–5.2)
Albumin: 2.2 g/dL — ABNORMAL LOW (ref 3.5–5.2)
Alk Phosphatase: 58 U/L (ref 35–104)
Alk Phosphatase: 71 U/L (ref 35–104)
Anion Gap: 10 mmol/L (ref 2–14)
Anion Gap: 10 mmol/L (ref 2–14)
BUN/Creatinine Ratio: 41 — ABNORMAL HIGH (ref 12–20)
BUN/Creatinine Ratio: 46 — ABNORMAL HIGH (ref 12–20)
BUN: 24 mg/dL — ABNORMAL HIGH (ref 8–23)
BUN: 24 mg/dL — ABNORMAL HIGH (ref 8–23)
CO2: 27 mmol/L (ref 20–29)
CO2: 27 mmol/L (ref 20–29)
Calcium: 9.4 mg/dL (ref 8.2–9.6)
Calcium: 9.8 mg/dL — ABNORMAL HIGH (ref 8.2–9.6)
Chloride: 99 mmol/L (ref 98–107)
Chloride: 99 mmol/L (ref 98–107)
Creatinine: 0.58 mg/dL — ABNORMAL LOW (ref 0.60–1.00)
Creatinine: 0.52 mg/dL — ABNORMAL LOW (ref 0.60–1.00)
Est, Glom Filt Rate: 83 ml/min/1.73m2 (ref >59)
Est, Glom Filt Rate: 85 ml/min/1.73m2 (ref >59)
Globulin: 3.7 g/dL (ref 2.0–4.0)
Globulin: 3.6 g/dL (ref 2.0–4.0)
Glucose: 184 mg/dL — ABNORMAL HIGH (ref 65–100)
Glucose: 191 mg/dL — ABNORMAL HIGH (ref 65–100)
Potassium: 5.3 mmol/L — ABNORMAL HIGH (ref 3.5–5.1)
Potassium: 3.5 mmol/L (ref 3.5–5.1)
Sodium: 135 mmol/L — ABNORMAL LOW (ref 136–145)
Sodium: 137 mmol/L (ref 136–145)
Total Bilirubin: 0.5 mg/dL (ref 0.0–1.2)
Total Bilirubin: 0.5 mg/dL (ref 0.0–1.2)
Total Protein: 5.7 g/dL — ABNORMAL LOW (ref 6.4–8.3)
Total Protein: 5.8 g/dL — ABNORMAL LOW (ref 6.4–8.3)

## 2024-10-20 LAB — BASIC METABOLIC PANEL
Anion Gap: 9 mmol/L (ref 2–14)
BUN/Creatinine Ratio: 40 — ABNORMAL HIGH (ref 12–20)
BUN: 22 mg/dL (ref 8–23)
CO2: 26 mmol/L (ref 20–29)
Calcium: 9 mg/dL (ref 8.2–9.6)
Chloride: 100 mmol/L (ref 98–107)
Creatinine: 0.57 mg/dL — ABNORMAL LOW (ref 0.60–1.00)
Est, Glom Filt Rate: 83 ml/min/1.73m2 (ref >59)
Glucose: 181 mg/dL — ABNORMAL HIGH (ref 65–100)
Sodium: 135 mmol/L — ABNORMAL LOW (ref 136–145)

## 2024-10-20 LAB — CBC WITH AUTO DIFFERENTIAL
Basophils %: 0.1 % (ref 0.0–1.0)
Basophils Absolute: 0.02 K/UL (ref 0.00–0.10)
Eosinophils %: 0.1 % (ref 0.0–7.0)
Eosinophils Absolute: 0.01 K/UL (ref 0.00–0.40)
Hematocrit: 30.9 % — ABNORMAL LOW (ref 35.0–47.0)
Hemoglobin: 10 g/dL — ABNORMAL LOW (ref 11.5–16.0)
Immature Granulocytes %: 0.6 % — ABNORMAL HIGH (ref 0.0–0.5)
Immature Granulocytes Absolute: 0.1 K/UL — ABNORMAL HIGH (ref 0.00–0.04)
Lymphocytes %: 6.3 % — ABNORMAL LOW (ref 12.0–49.0)
Lymphocytes Absolute: 1.09 K/UL (ref 0.80–3.50)
MCH: 27 pg (ref 26.0–34.0)
MCHC: 32.4 g/dL (ref 30.0–36.5)
MCV: 83.5 FL (ref 80.0–99.0)
MPV: 10 FL (ref 8.9–12.9)
Monocytes %: 5.5 % (ref 5.0–13.0)
Monocytes Absolute: 0.96 K/UL (ref 0.00–1.00)
Neutrophils %: 87.4 % — ABNORMAL HIGH (ref 32.0–75.0)
Neutrophils Absolute: 15.19 K/UL — ABNORMAL HIGH (ref 1.80–8.00)
Nucleated RBCs: 0.1 /100{WBCs} — ABNORMAL HIGH
Platelets: 540 K/uL — ABNORMAL HIGH (ref 150–400)
RBC: 3.7 M/uL — ABNORMAL LOW (ref 3.80–5.20)
RDW: 12.9 % (ref 11.5–14.5)
WBC: 17.4 K/uL — ABNORMAL HIGH (ref 3.6–11.0)
nRBC: 0.02 K/uL — ABNORMAL HIGH (ref 0.00–0.01)

## 2024-10-20 LAB — POCT GLUCOSE
POC Glucose: 160 mg/dL — ABNORMAL HIGH (ref 65–117)
POC Glucose: 183 mg/dL — ABNORMAL HIGH (ref 65–117)
POC Glucose: 189 mg/dL — ABNORMAL HIGH (ref 65–117)
POC Glucose: 214 mg/dL — ABNORMAL HIGH (ref 65–117)

## 2024-10-20 MED ORDER — AMLODIPINE BESYLATE 5 MG PO TABS
5 | Freq: Every day | ORAL | Status: DC
Start: 2024-10-20 — End: 2024-10-21
  Administered 2024-10-20 – 2024-10-21 (×2): 10 mg via ORAL

## 2024-10-20 MED ORDER — INSULIN LISPRO 100 UNIT/ML IJ SOLN
100 | Freq: Four times a day (QID) | INTRAMUSCULAR | Status: DC
Start: 2024-10-20 — End: 2024-10-21
  Administered 2024-10-20 – 2024-10-21 (×2): 2 [IU] via SUBCUTANEOUS

## 2024-10-20 MED ORDER — QUETIAPINE FUMARATE 25 MG PO TABS
25 | Freq: Three times a day (TID) | ORAL | Status: DC | PRN
Start: 2024-10-20 — End: 2024-10-22

## 2024-10-20 MED ORDER — ACETAMINOPHEN 325 MG PO TABS
325 | ORAL | Status: DC | PRN
Start: 2024-10-20 — End: 2024-10-27

## 2024-10-20 MED ORDER — FAMOTIDINE 20 MG PO TABS
20 | Freq: Every day | ORAL | Status: DC
Start: 2024-10-20 — End: 2024-10-21
  Administered 2024-10-21: 14:00:00 20 mg via ORAL

## 2024-10-20 MED ORDER — INSULIN GLARGINE 100 UNIT/ML SC SOLN
100 | Freq: Every evening | SUBCUTANEOUS | Status: DC
Start: 2024-10-20 — End: 2024-10-21
  Administered 2024-10-21: 02:00:00 5 [IU] via SUBCUTANEOUS

## 2024-10-20 MED ORDER — PREDNISONE 20 MG PO TABS
20 | Freq: Every day | ORAL | Status: DC
Start: 2024-10-20 — End: 2024-10-21
  Administered 2024-10-21: 14:00:00 40 mg via ORAL

## 2024-10-20 MED ORDER — METOPROLOL TARTRATE 5 MG/5ML IV SOLN
5 | Freq: Four times a day (QID) | INTRAVENOUS | Status: DC
Start: 2024-10-20 — End: 2024-10-20
  Administered 2024-10-20 (×4): 2.5 mg via INTRAVENOUS

## 2024-10-20 MED ORDER — GUAIFENESIN 100 MG/5ML PO LIQD
100 | ORAL | Status: DC | PRN
Start: 2024-10-20 — End: 2024-11-03

## 2024-10-20 MED ORDER — CARVEDILOL 3.125 MG PO TABS
3.125 | Freq: Two times a day (BID) | ORAL | Status: DC
Start: 2024-10-20 — End: 2024-10-21
  Administered 2024-10-20 – 2024-10-21 (×2): 3.125 mg via ORAL

## 2024-10-20 MED ORDER — DIAZEPAM 5 MG/ML IJ SOLN
5 | Freq: Three times a day (TID) | INTRAMUSCULAR | Status: DC | PRN
Start: 2024-10-20 — End: 2024-10-22

## 2024-10-20 MED ORDER — AMOXICILLIN-POT CLAVULANATE 500-125 MG PO TABS
500-125 | Freq: Two times a day (BID) | ORAL | Status: DC
Start: 2024-10-20 — End: 2024-10-21
  Administered 2024-10-21 (×2): 1 via ORAL

## 2024-10-20 MED ORDER — HYDRALAZINE HCL 20 MG/ML IJ SOLN
20 | Freq: Four times a day (QID) | INTRAMUSCULAR | Status: DC | PRN
Start: 2024-10-20 — End: 2024-11-03

## 2024-10-20 MED FILL — METOPROLOL TARTRATE 5 MG/5ML IV SOLN: 5 mg/mL | INTRAVENOUS | Qty: 5 | Fill #0

## 2024-10-20 MED FILL — METHYLPREDNISOLONE SODIUM SUCC 40 MG IJ SOLR: 40 mg | INTRAMUSCULAR | Qty: 40 | Fill #0

## 2024-10-20 MED FILL — AMLODIPINE BESYLATE 5 MG PO TABS: 5 mg | ORAL | Qty: 2 | Fill #0

## 2024-10-20 MED FILL — FAMOTIDINE (PF) 20 MG/2 ML IV SOLN: 20 MG/2ML | INTRAVENOUS | Qty: 2 | Fill #0

## 2024-10-20 MED FILL — AMPICILLIN-SULBACTAM SODIUM 3 (2-1) G IJ SOLR: 3 (2-1) g | INTRAMUSCULAR | Qty: 3000 | Fill #0

## 2024-10-20 MED FILL — CARVEDILOL 3.125 MG PO TABS: 3.125 mg | ORAL | Qty: 1 | Fill #0

## 2024-10-20 MED FILL — INSULIN LISPRO 100 UNIT/ML IJ SOLN: 100 [IU]/mL | INTRAMUSCULAR | Qty: 2 | Fill #0

## 2024-10-20 MED FILL — ENOXAPARIN SODIUM 30 MG/0.3ML IJ SOSY: 30 MG/0.3ML | INTRAMUSCULAR | Qty: 0.3 | Fill #0

## 2024-10-20 NOTE — Plan of Care (Signed)
 "  Problem: Skin/Tissue Integrity  Goal: Skin integrity remains intact  Description: 1.  Monitor for areas of redness and/or skin breakdown  2.  Assess vascular access sites hourly  3.  Every 4-6 hours minimum:  Change oxygen saturation probe site  4.  Every 4-6 hours:  If on nasal continuous positive airway pressure, respiratory therapy assess nares and determine need for appliance change or resting period  10/20/2024 1828 by Rigoberto Gauze, RN  Outcome: Progressing  10/20/2024 0441 by Timmie Pica, RN  Outcome: Progressing     Problem: Safety - Adult  Goal: Free from fall injury  10/20/2024 1828 by Rigoberto Gauze, RN  Outcome: Progressing  10/20/2024 0441 by Timmie Pica, RN  Outcome: Progressing     Problem: Chronic Conditions and Co-morbidities  Goal: Patient's chronic conditions and co-morbidity symptoms are monitored and maintained or improved  10/20/2024 1828 by Rigoberto Gauze, RN  Outcome: Progressing  10/20/2024 0441 by Timmie Pica, RN  Outcome: Progressing     Problem: Discharge Planning  Goal: Discharge to home or other facility with appropriate resources  10/20/2024 1828 by Rigoberto Gauze, RN  Outcome: Progressing  10/20/2024 0441 by Timmie Pica, RN  Outcome: Progressing     Problem: Respiratory - Adult  Goal: Achieves optimal ventilation and oxygenation  10/20/2024 1828 by Rigoberto Gauze, RN  Outcome: Progressing  10/20/2024 0441 by Timmie Pica, RN  Outcome: Progressing     Problem: Neurosensory - Adult  Goal: Achieves stable or improved neurological status  10/20/2024 1828 by Rigoberto Gauze, RN  Outcome: Progressing  10/20/2024 0441 by Timmie Pica, RN  Outcome: Progressing  Goal: Achieves maximal functionality and self care  10/20/2024 1828 by Rigoberto Gauze, RN  Outcome: Progressing  10/20/2024 0441 by Timmie Pica, RN  Outcome: Progressing     Problem: Cardiovascular - Adult  Goal: Maintains optimal cardiac output and hemodynamic stability  10/20/2024 1828 by Rigoberto Gauze, RN  Outcome: Progressing  10/20/2024  0441 by Timmie Pica, RN  Outcome: Progressing  Goal: Absence of cardiac dysrhythmias or at baseline  10/20/2024 1828 by Rigoberto Gauze, RN  Outcome: Progressing  10/20/2024 0441 by Timmie Pica, RN  Outcome: Progressing     Problem: Skin/Tissue Integrity - Adult  Goal: Skin integrity remains intact  Description: 1.  Monitor for areas of redness and/or skin breakdown  2.  Assess vascular access sites hourly  3.  Every 4-6 hours minimum:  Change oxygen saturation probe site  4.  Every 4-6 hours:  If on nasal continuous positive airway pressure, respiratory therapy assess nares and determine need for appliance change or resting period  10/20/2024 1828 by Rigoberto Gauze, RN  Outcome: Progressing  10/20/2024 0441 by Timmie Pica, RN  Outcome: Progressing  Goal: Incisions, wounds, or drain sites healing without S/S of infection  10/20/2024 1828 by Rigoberto Gauze, RN  Outcome: Progressing  10/20/2024 0441 by Timmie Pica, RN  Outcome: Progressing  Goal: Oral mucous membranes remain intact  10/20/2024 1828 by Rigoberto Gauze, RN  Outcome: Progressing  10/20/2024 0441 by Timmie Pica, RN  Outcome: Progressing     Problem: Musculoskeletal - Adult  Goal: Return mobility to safest level of function  10/20/2024 1828 by Rigoberto Gauze, RN  Outcome: Progressing  10/20/2024 0441 by Timmie Pica, RN  Outcome: Progressing  Goal: Maintain proper alignment of affected body part  10/20/2024 1828 by Rigoberto Gauze, RN  Outcome: Progressing  10/20/2024 0441 by Timmie Pica, RN  Outcome: Progressing  Goal: Return ADL status to a safe  level of function  10/20/2024 1828 by Rigoberto Gauze, RN  Outcome: Progressing  10/20/2024 0441 by Timmie Pica, RN  Outcome: Progressing     Problem: Gastrointestinal - Adult  Goal: Minimal or absence of nausea and vomiting  10/20/2024 1828 by Rigoberto Gauze, RN  Outcome: Progressing  10/20/2024 0441 by Timmie Pica, RN  Outcome: Progressing  Goal: Maintains or returns to baseline bowel function  10/20/2024 1828 by Rigoberto Gauze, RN  Outcome: Progressing  10/20/2024 0441 by Timmie Pica, RN  Outcome: Progressing  Goal: Maintains adequate nutritional intake  10/20/2024 1828 by Rigoberto Gauze, RN  Outcome: Progressing  10/20/2024 0441 by Timmie Pica, RN  Outcome: Progressing     Problem: Genitourinary - Adult  Goal: Absence of urinary retention  10/20/2024 1828 by Rigoberto Gauze, RN  Outcome: Progressing  10/20/2024 0441 by Timmie Pica, RN  Outcome: Progressing     Problem: Infection - Adult  Goal: Absence of infection at discharge  10/20/2024 1828 by Rigoberto Gauze, RN  Outcome: Progressing  10/20/2024 0441 by Timmie Pica, RN  Outcome: Progressing  Goal: Absence of infection during hospitalization  10/20/2024 1828 by Rigoberto Gauze, RN  Outcome: Progressing  10/20/2024 0441 by Timmie Pica, RN  Outcome: Progressing     Problem: Metabolic/Fluid and Electrolytes - Adult  Goal: Electrolytes maintained within normal limits  10/20/2024 1828 by Rigoberto Gauze, RN  Outcome: Progressing  10/20/2024 0441 by Timmie Pica, RN  Outcome: Progressing  Goal: Hemodynamic stability and optimal renal function maintained  10/20/2024 1828 by Rigoberto Gauze, RN  Outcome: Progressing  10/20/2024 0441 by Timmie Pica, RN  Outcome: Progressing  Goal: Glucose maintained within prescribed range  10/20/2024 1828 by Rigoberto Gauze, RN  Outcome: Progressing  10/20/2024 0441 by Timmie Pica, RN  Outcome: Progressing     "

## 2024-10-20 NOTE — Plan of Care (Signed)
 "  Problem: Skin/Tissue Integrity  Goal: Skin integrity remains intact  Description: 1.  Monitor for areas of redness and/or skin breakdown  2.  Assess vascular access sites hourly  3.  Every 4-6 hours minimum:  Change oxygen saturation probe site  4.  Every 4-6 hours:  If on nasal continuous positive airway pressure, respiratory therapy assess nares and determine need for appliance change or resting period  Outcome: Progressing     Problem: Safety - Adult  Goal: Free from fall injury  Outcome: Progressing     Problem: Chronic Conditions and Co-morbidities  Goal: Patient's chronic conditions and co-morbidity symptoms are monitored and maintained or improved  Outcome: Progressing     Problem: Discharge Planning  Goal: Discharge to home or other facility with appropriate resources  Outcome: Progressing     Problem: Respiratory - Adult  Goal: Achieves optimal ventilation and oxygenation  Outcome: Progressing     Problem: Neurosensory - Adult  Goal: Achieves stable or improved neurological status  Outcome: Progressing  Goal: Achieves maximal functionality and self care  Outcome: Progressing     Problem: Cardiovascular - Adult  Goal: Maintains optimal cardiac output and hemodynamic stability  Outcome: Progressing  Goal: Absence of cardiac dysrhythmias or at baseline  Outcome: Progressing     Problem: Skin/Tissue Integrity - Adult  Goal: Skin integrity remains intact  Description: 1.  Monitor for areas of redness and/or skin breakdown  2.  Assess vascular access sites hourly  3.  Every 4-6 hours minimum:  Change oxygen saturation probe site  4.  Every 4-6 hours:  If on nasal continuous positive airway pressure, respiratory therapy assess nares and determine need for appliance change or resting period  Outcome: Progressing  Goal: Incisions, wounds, or drain sites healing without S/S of infection  Outcome: Progressing  Goal: Oral mucous membranes remain intact  Outcome: Progressing     Problem: Musculoskeletal - Adult  Goal:  Return mobility to safest level of function  Outcome: Progressing  Goal: Maintain proper alignment of affected body part  Outcome: Progressing  Goal: Return ADL status to a safe level of function  Outcome: Progressing     Problem: Gastrointestinal - Adult  Goal: Minimal or absence of nausea and vomiting  Outcome: Progressing  Goal: Maintains or returns to baseline bowel function  Outcome: Progressing  Goal: Maintains adequate nutritional intake  Outcome: Progressing     Problem: Genitourinary - Adult  Goal: Absence of urinary retention  Outcome: Progressing     Problem: Infection - Adult  Goal: Absence of infection at discharge  Outcome: Progressing  Goal: Absence of infection during hospitalization  Outcome: Progressing     Problem: Metabolic/Fluid and Electrolytes - Adult  Goal: Electrolytes maintained within normal limits  Outcome: Progressing  Goal: Hemodynamic stability and optimal renal function maintained  Outcome: Progressing  Goal: Glucose maintained within prescribed range  Outcome: Progressing     Problem: Hematologic - Adult  Goal: Maintains hematologic stability  Outcome: Progressing     Problem: Confusion  Goal: Confusion, delirium, dementia, or psychosis is improved or at baseline  Description: INTERVENTIONS:  1. Assess for possible contributors to thought disturbance, including medications, impaired vision or hearing, underlying metabolic abnormalities, dehydration, psychiatric diagnoses, and notify attending LIP  2. Institute high risk fall precautions, as indicated  3. Provide frequent short contacts to provide reality reorientation, refocusing and direction  4. Decrease environmental stimuli, including noise as appropriate  5. Monitor and intervene to maintain adequate nutrition, hydration, elimination,  sleep and activity  6. If unable to ensure safety without constant attention obtain sitter and review sitter guidelines with assigned personnel  7. Initiate Psychosocial CNS and Spiritual Care  consult, as indicated  Outcome: Progressing     Problem: Pain  Goal: Verbalizes/displays adequate comfort level or baseline comfort level  Outcome: Progressing     Problem: Safety - Medical Restraint  Goal: Remains free of injury from restraints (Restraint for Interference with Medical Device)  Description: INTERVENTIONS:  1. Determine that other, less restrictive measures have been tried or would not be effective before applying the restraint  2. Evaluate the patient's condition at the time of restraint application  3. Inform patient/family regarding the reason for restraint  4. Q2H: Monitor safety, psychosocial status, comfort, nutrition and hydration  Outcome: Progressing     Problem: ABCDS Injury Assessment  Goal: Absence of physical injury  Outcome: Progressing     "

## 2024-10-20 NOTE — Care Coordination (Addendum)
"  Care Management Progress Note        Reason for Admission:   Hypoxemia [R09.02]  Severe sepsis (HCC) [A41.9, R65.20]  Community acquired pneumonia, unspecified laterality [J18.9]         Patient Admission Status: Inpatient  RUR: 18%  Hospitalization in the last 30 days (Readmission):  No        Transition of care plan:  Patient was discussed in IDR and continues to be medically managed. Palliative has been consulted.    Dispo: TBD. Daughter [name] provided IPR preferences to previous CM on Friday 12/26. [employer] [facility] continue to review pt's clinicals. [facility] previously accepted.   If SNF or IPR:  Date FOC offered: 12/22 & 12/26  Accepting facility:   Date authorization started with reference number: Patient will not need insurance authorization and has met her 3 midnights under medicare for SNF.   Date authorization received and expires:     CM is following for medical stability and outcome of palliative conversation with family.    Discharge plan communicated with patient and/or discharge caregiver: Yes      Date 1st IMM letter given: 10/15/24    Outpatient follow-up.    Transport at discharge: stretcher.        ___________________________________________   [name], RN Case Manager  10/20/2024   12:24 PM       "

## 2024-10-20 NOTE — Consults (Signed)
 "Palliative Medicine  Patient Name: Chelsea Schultz  Date of Birth: [birth year]  MRN: [id number]  Age: 89 y.o.  Gender: female    Date of Initial Consult: 10/20/24  Date of Service: 10/20/2024  Time: 4:19 PM  Provider: Franchot Dickens, APRN - [facility] Day: 11  Admit Date: 10/10/2024  Referring Provider:  Dr. Rosalea       Reasons for Consultation:  Other: goals of care, code status    HISTORY OF PRESENT ILLNESS (HPI):   Chelsea Schultz is a 89 y.o. female with a past medical history of interstitial lung disease, prior CVA (2013, 2016), DM, HTN, recurrent UTIs who was admitted on 10/10/2024 from home with productive cough, fatigue for ~1 week. Upon arrival to ED, noted to be febrile and hypoxic requiring supplemental oxygen. Initial chest imaging obtained in ED revealing diffuse interstitial prominence of ground glass opacities suggestive of pulmonary fibrosis vs vs pneumonitis vs pneumonia. Was started on empiric IV antibiotics and admitted for further workup, medical management. Course has been significant for UTI, hospital acquired delirium with waxing/waning mentation. Review indicates fluctuating agitation causing her to self remove supplemental oxygen causing hypoxia, which has prompted several RRTs. Also with increasing lethargy, overall decline, concerns of aspiration with progressive dysphagia. Plan for MBS today. Palliative consulted to assist with goals.       Psychosocial: Six children - 5 living children to include four daughters and one son. Lives with daughter [name], pt has resided with Cathy since 2011. Prior, lived with daughter [name] in [place]. [name] is primary caregiver, does work full time hours at the [employer]. Additionally family has put in place private caregiver M-F 9-5, and 3 evenings a week from 5-9.     Recent baseline prior to admission: obtained from daughters   -Cognitively, able to recognize family members, able to carry on discussion/conversation with daughters and caregivers. Frequently has  disorientation in the evenings where she can become tearful/emotional but often redirectable.   -Functionally, able to stand/pivot and participate in some of her care albeit essentially reliant on all ADLs including bathing, toileting, dressing. Requires meal preparation.     Pt seen today at bedside prior to family meeting with four daughters. Currently resting in bed, on 3L NC. No distress, is confused but redirectable without overt agitation.    PALLIATIVE DIAGNOSES:    Acute hypoxic respiratory failure; multifactorial  Delirium superimposed on baseline dementia  Dysphagia  High risk aspiration   Physical debility, generalized weakness   Goals of care  Advance care planning   Code status discussion     ASSESSMENT AND PLAN:   Chart reviewed prior to seeing pt; including notes, labs, imaging.   Initially attempted to see pt this morning, however transporting to MBS. Circled back for planned 2:00 PM meeting.   Pt seen at bedside with palliative LCSW, Melissa. Family asks to meet Assessment required an independent historian, so additional information was obtained from four daughters present for meeting -- [name] [name] [name]. Palliative role introduced as support team to assist in navigating goals, can assist with symptoms.   Care goals --   Spent time today understanding pt baseline, both from cognitive and functional standpoint, prior to admission. [name] provides most insight here as pt has lived with her since 2011. Discuss in depth the many resources family has brought in to help pt be taken care of in her home setting.   We then walk through hospital trajectory thus far - including acute issues,  baseline pulmonary fibrosis which family was ultimately not aware of. We discuss nature of pulmonary fibrosis and how challenging this can be to not only treat superimposed infection, but also manage respiratory status, high risk for decline is acutely worsens. We also discuss current IV steroid and IV antibiotic  regimen. Family asks very insightful questions in regard to barriers to discharge and what may be needed for her to medically stabilize to consider discharge.   We also discuss complications and setbacks through hospitalization. [name] recognizes the family seeing her in a one step forward two step back phenomenon, specifically given her mental status, struggles with agitation and delirium.   Inquire what there hopes are from here - as we discuss open and honestly lengthy hospitalization in conjunction with baseline chronic conditions, acute delirium - can be challenging to return to baseline from. Certainly a chance she may not be able to fully return to cognitive/functional baseline as before. Daughters express understanding in this.   Their hopes as of now is to see how she does over next several days, she if she is able to make gains. Were pleased to hear about outcomes of MBS. We discuss anticipated next steps to see if she is able to meet their expectations of signs of stability/improvement.   As hospice has been discussed this admission, we do regroup on this pathway and what it may look like if she does not make gains. Explain hospice philosophy and goals, how this would look as an added layer of support whether in the home or if looking at LTC facility - which they are exploring. State they are familiar with hospice.   At this time, family hoping to give her a shot to see if she can recover.   Goals/summary: continue current measures, see how she does over next several days if she is able to make gains. Hopeful for some recovery. If declines/does not well, family very open to regrouping for additional conversation.   Code status --   Noted prior ACP conversations where pt was DNR after discussions with hospitalist and daughters. However, family notes they did not have full understanding of this change - opted to return to FULL code.   Share that if pt was clinically worsening, would be open to  transitioning back to protecting her from resuscitation. Was open and honest in the event this occurs, very likely team would not have a chance to call daughters for decision making in regard to this. Also shared given underlying conditions, do not think pt would do well or we would be successful in this.   No changes today - continues to wish for FULL code.   ACP --   No AMD on file; some prior documentation noting dtr [name] is ?mPOA  Affirm with four daughters today, all children work together for decision making -- including son via phone   Symptoms --   Delirium:   Given baseline dementia, hospital environment, prolonged admission, acute infections  Recommend prioritizing non pharmacological measures:   Frequent reorientation and redirection   Awake and OOB during daytime hours, encourage mobility to chair for meals   Eye glasses/hearing aides on if applicable   Prioritize good sleep hygiene with clustering care, minimal sleep interruptions, labs obtained in late morning hours rather than middle of night   Melatonin 6 mg nightly   Agree to utilize Seroquel  12.5 mg prn however would reserve for nighttime use to avoid daytime drowsiness   Would use IV Valium 2.5 mg  every 8 hours prn as last line given risk of benzos worsening delirium   Initial consult note routed to primary continuity provider and/or primary health care team members  Discussed management with Dr. Rondi, Amy RN  Please call with any palliative questions or concerns.  Palliative Care Team is available via perfect serve or via phone.    Referrals to:   []  Outpatient Palliative Care  []  Home Based Palliative Care  []  Home Based Primary Care  []  Hospice       ADVANCE CARE PLANNING:   []  The Pall Med Interdisciplinary Team has updated the ACP Navigator with Health Care Decision Maker and Patient Capacity      Primary Decision Maker: Konstantina, Nachreiner - Child [phone]    Primary Decision Maker: Manya Notice - Child [phone]  Confirm Advance Directive: None  Patient Would Like to Complete Advance Directive: Unable    Current Code Status: Full Code     Goals of Care: Goals of Care and Interventions  Patient/Health Care Proxy Stated Goals: Recovery from acute illness  Medical Interventions: Full interventions  Life Goals  Patient and Family Personal Goals: continue current measures, hopeful for recovery    Please refer to Palliative Medicine ACP notes for further details.    PALLIATIVE ASSESSMENT:      Palliative Performance Scale (PPS):  PPS: 30    ECOG:        Modified ESAS:  Modified-[place] Symptom Assessment Scale (ESAS)  Pain Score: No pain  Dyspnea Score: No shortness of breath    Clinical Pain Assessment (nonverbal scale for severity on nonverbal patients):   Clinical Pain Assessment  Severity: 0  Location: UTA  Character: UTA  Duration: UTA  Factors: UTA  Frequency: UTA       NVPS:  Adult Nonverbal Pain Scale (NVPS)  Face: No particular expression or smile  Activity (Movement): Laid quietly, normal position  Guarding: Lying quietly, no positioning of hands over areas of bod  Physiology (Vital Signs): Stable vital signs  Respiratory: Baseline RR/SpO2 compliant with ventilator  NVPS Score : 0    RDOS:  RDOS  Heart rate per minute: less than 90  Respiratory Rate per Minute: less than 19  Restlessness:non-purposeful: None  Paradoxical breathing pattern:abdomen moves in on inspiration: None  Accessory muscle use: rise in clavicle during inspiration: None  Grunting at end-expiration: guttural sound: None  Nasal flaring: involuntary movement of nares: None  Total : 0      Vital Signs: Blood pressure (!) 154/77, pulse 69, temperature 97.7 F (36.5 C), temperature source Oral, resp. rate 16, height 1.499 m (4' 11.02), weight 44.9 kg (98 lb 15.8 oz), SpO2 92%.    PHYSICAL ASSESSMENT:   General: []  Oriented x3  []  Well appearing  []  Intubated  [x] Ill appearing  [] Other:  Mental Status: []  Normal mental status exam  []  Drowsy   [x]  Confused  [] Other:  Cardiovascular: []  Regular rate/rhythm  []  Arrhythmia  []  Other:  Chest: [x]  Effort normal  [] Lungs clear  []  Respiratory distress  [] Tachypnea  []  Other: 3L NC  Abdomen: []  Soft/non-tender  []  Normal appearance  []  Distended  []  Ascites  []  Other:  Neurological: []  Normal speech  []  Normal sensation  [x] Deficits present: confused  Extremity: []  Normal skin color/temp  []  Clubbing/cyanosis  []  No edema  [x]  Other: debilitated appearing, temporal muscle wasting noted     Wt Readings from Last 15 Encounters:   10/17/24 44.9 kg (98 lb 15.8 oz)  06/26/23 40.8 kg (90 lb)   06/19/22 49.9 kg (110 lb)   02/24/22 48.8 kg (107 lb 9.6 oz)   12/29/21 43.5 kg (96 lb)        Current Diet: ADULT DIET; Dysphagia - Soft and Bite Sized  ADULT ORAL NUTRITION SUPPLEMENT; Dinner; Standard High Calorie/High Protein Oral Supplement       PSYCHOSOCIAL/SPIRITUAL SCREENING:   Palliative IDT has assessed this patient for cultural preferences / practices and a referral made as appropriate to needs Gaffer, Patient Advocacy, Ethics, etc.)    Spiritual Affiliation: Non-Denominational    Any spiritual / religious concerns:  []  Yes /  [x]  No   If Yes to discuss with pastoral care during IDT     Does caregiver feel burdened by caring for their loved one:   []  Yes /  [x]  No /  []  No Caregiver Present/Available []  No Caregiver []  Pt Lives at Facility  If Yes to discuss with social work during IDT    Anticipatory grief assessment:   [x]  Normal  / []  Maladaptive     If Maladaptive to discuss with social work during IDT    ESAS Anxiety:      ESAS Depression:          LAB AND IMAGING FINDINGS:   Objective data reviewed:  labs, images, records, medication use, vitals, and chart     FINAL COMMENTS   Thank you for allowing Palliative Medicine to participate in the care of Leonie VEAR Pouch.    Only check if applicable and billing time based rather than MDM  []  The total encounter time on this service date was  __75__ minutes which was spent performing a face-to-face encounter and personally completing the provider-level activities documented in the note. This includes time spent prior to the visit and after the visit in direct care of the patient. This time does not include time spent in any separately reportable services.    Electronically signed by   Franchot Dickens, APRN - CNP  Palliative Care Team  on 10/20/2024 at 4:19 PM      "

## 2024-10-20 NOTE — Progress Notes (Signed)
 "[facility]     Name: Chelsea Schultz MRN: [id number]   DOB: 01/09/1926 Hospital: [facility] Chelsea Schultz   Date: 10/20/2024        Impression Plan   Acute respiratory failure  Hypoxia  Bilateral PNA  Hypoxia  ILD exacerbation  Pulmonary fibrosis- UIP  GERD  Leukocytosis  HxofCVA  Delirium               Continue to wean O2 as tolerated, currently on 1.5LNC o2  Continue antibiotics   Continue IVCS- agree with weaning   RVP negative   BP control  OOB as tolerated, attempt to have her sitting in high Fowlers  Speech is following, plan for MBS             Radiology  ( personally reviewed) CTA chest 10/11/24: Bilateral pulmonary fibrosis in UIP pattern with superimposed  GG infiltrates   ABG Invalid input(s): PHI, PO2I, PCO2I       Subjective     Cc: abnormal CT chest    89 yo with PMHx of dementia, CVA, spinal stenosis, chronic debilitation and pulmonary fibrosis who presented to the ER on 12/19 with increased somnolence and  weakness. Pt was found to be hypoxic. CT chest revealed bilateral GG infiltrates superimposed on known pulmonary fibrosis. Pt seen at [facility] in 2017 with plan for PFTs and repeat CT chest, but did not return for follow up. Pt is altered. Wakes up and answers some questions. Daughter denies any hx of GERD/episodes of vomiting. Denies any witnessed choking or aspiration episodes. Never smoker.     12/23: Arousable to voice, able to state name and open eyes.  On 3.5 L, satting 91%. No leukocytosis on a.m. labs.  10-17-24: remains on 5 LPM.  Having issues with pulling off her O2 due to delirium.    10/20/24: On 1.5LNC o2. She denies any SOB.     Review of Systems:  Review of systems not obtained due to patient factors.    Past Medical History:   Diagnosis Date    Arthritis     CVA (cerebral infarction)     Diabetes (HCC) 01/09/2015    Hemoglobin A1C 6.4 (per records, done at outside facility)    GERD (gastroesophageal reflux disease)     History of recurrent UTIs     on  chronic antibiotic prophylaxis, followed by urology    HLD (hyperlipidemia) 01/09/2015    Total Chol 141, Tri 49, HDL 59, LDL 72    Hypertension     Neuropathy     Osteopetrosis     Spinal stenosis       Past Surgical History:   Procedure Laterality Date    ORTHOPEDIC SURGERY      Lumbar laminectomy    ORTHOPEDIC SURGERY      Lumbar fusion      Prior to Admission medications   Medication Sig Start Date End Date Taking? Authorizing Provider   pregabalin  (LYRICA ) 100 MG capsule TAKE 1 CAPSULE BY MOUTH DAILY AS NEEDED FOR NERVE PAIN  Patient taking differently: Take 1 capsule by mouth daily. TAKE 1 CAPSULE BY MOUTH DAILY FOR NERVE PAIN 09/16/24 12/17/24 Yes Uhlar, Adrianna K, MD   amLODIPine  (NORVASC ) 10 MG tablet TAKE 1 TABLET(10 MG) BY MOUTH EVERY NIGHT  Patient taking differently: Take 1 tablet by mouth daily TAKE 1 TABLET(10 MG) BY MOUTH EVERY NIGHT 07/17/24  Yes Ledwith, Alexandra G, MD   mirabegron  (MYRBETRIQ ) 50 MG TB24 Take 50 mg  by mouth daily 06/26/23  Yes Ledwith, Alexandra G, MD   metFORMIN  (GLUCOPHAGE ) 500 MG tablet TAKE 1 TABLET BY MOUTH TWICE DAILY WITH A MEAL 09/02/24   Ledwith, Alexandra G, MD   famotidine  (PEPCID ) 40 MG tablet TAKE 1/2 TABLET BY MOUTH EVERY MORNING AND 1/2 TABLET AT BEDTIME 08/21/24   Ledwith, Alexandra G, MD   losartan  (COZAAR ) 100 MG tablet TAKE 1 TABLET BY MOUTH DAILY 08/21/24 08/16/25  Ledwith, Alexandra G, MD   Vaginal Lubricant (VAGISIL LUBRICANT) GEL Place vaginally    [provider]   vitamin B-12 (CYANOCOBALAMIN) 1000 MCG tablet     [provider]   Cholecalciferol  (VITAMIN D -3 PO) Take by mouth    [provider]   polyethylene glycol (GLYCOLAX ) 17 GM/SCOOP powder Take 17 g by mouth daily as needed (constipation) 08/01/24   Guadelupe Mace POUR, MD   atorvastatin  (LIPITOR ) 10 MG tablet Take 1 tablet by mouth daily 08/01/24   Uhlar, Adrianna K, MD   carvedilol  (COREG ) 3.125 MG tablet Take 1 tablet by mouth 2 times daily 08/01/24   Uhlar, Adrianna K, MD    Diabetic Shoe MISC by Does not apply route  Patient not taking: Reported on 08/19/2024 08/01/24   Guadelupe Mace POUR, MD   METAMUCIL FIBER PO Take by mouth    [provider]   trospium  (SANCTURA ) 20 MG tablet Take 1 tablet by mouth daily    [provider]   acetaminophen  (TYLENOL ) 650 MG extended release tablet Take 1 tablet by mouth daily as needed    Automatic Reconciliation, Ar   ascorbic acid (VITAMIN C) 1000 MG tablet Take 1 tablet by mouth daily    Automatic Reconciliation, Ar   aspirin 81 MG EC tablet ceived the following from [facility]: Outside name: aspirin delayed-release 81 mg tablet 10/31/21   Automatic Reconciliation, Ar   diclofenac sodium (VOLTAREN) 1 % GEL Apply 2 g topically 4 times daily    Automatic Reconciliation, Ar   docusate (COLACE, DULCOLAX) 100 MG CAPS Take 100 mg by mouth daily  Patient not taking: Reported on 08/19/2024    Automatic Reconciliation, Ar   Estradiol (VAGIFEM) 10 MCG TABS vaginal tablet Place 1 tablet vaginally  Patient not taking: Reported on 08/19/2024 12/01/21 12/01/22  Automatic Reconciliation, Ar     @CMEDSCH @  Allergies   Allergen Reactions    Nitrofurantoin Rash     Was placed on it 09/2019 and pt noted rash on her hand immediately after starting antibx. Will add it back to her allergy list.      Social History     Tobacco Use    Smoking status: Never    Smokeless tobacco: Never   Substance Use Topics    Alcohol use: No      Family History   Problem Relation Age of Onset    Stroke Father     Cancer Sister     Hypertension Father     Hypertension Mother     Stroke Mother     Diabetes Mother     Stroke Sister           Laboratory: I have personally reviewed the critical care flowsheet and labs.     Recent Labs     10/18/24  0212 10/19/24  0614 10/20/24  0110   WBC 12.6* 16.6* 17.4*   HGB 9.9* 10.4* 10.0*   HCT 30.3* 32.4* 30.9*   PLT 483* 512* 540*     Recent  Labs     10/18/24  0212 10/19/24  0614 10/20/24  0110 10/20/24  0220  10/20/24  0536   NA 142 139 135* 135* 137   K 3.9 4.1 Hemolyzed, Recollection Recommended 5.3* 3.5   CL 105 103 100 99 99   CO2 31* 32* 26 27 27    BUN 23 21 22  24* 24*   MG 1.8 1.9  --   --   --    PHOS 2.7 2.2*  --   --   --    ALT  --   --   --  8* 6*       Objective:     Mode Rate Tidal Volume Pressure FiO2 PEEP                    Vital Signs:     TMAX(24)      Intake/Output:   Last shift:         Last 3 shifts: No intake/output data recorded.[id number]  Intake/Output Summary (Last 24 hours) at 10/20/2024 1100  Last data filed at 10/19/2024 2000  Gross per 24 hour   Intake 712.82 ml   Output 660 ml   Net 52.82 ml     EXAM:   GENERAL: opens eyes to speech, answers some questions, HEENT:  EOMI, no alar flaring or epistaxis, oral mucosa moist without cyanosis, LUNGS: CTA, no w/r/r HEART:  Regular rate and rhythm with no MGR; no edema is present, ABDOMEN:  soft with no tenderness, EXTREMITIES:  warm with no cyanosis, SKIN:  no jaundice or ecchymosis, and NEUROLOGIC: Arousable to voice, grossly non-focal    Personally reviewed CXR and radiology report from 10/19/24: interval clearing of luminary edema superimposed on interstitial fibrosis. Residual asymmetric airspace disease in the RUL and left base.      "

## 2024-10-20 NOTE — Progress Notes (Signed)
"  Palliative team attempted visit.  Pt was being transported off floor for MBS.  Will follow up later today.  Discussed with Attending Physician.   "

## 2024-10-20 NOTE — Plan of Care (Signed)
 "SPEECH PATHOLOGY MODIFIED BARIUM SWALLOW STUDY  Patient: Chelsea Schultz (89 y.o. female)  Date: 10/20/2024  Primary Diagnosis: Hypoxemia [R09.02]  Severe sepsis (HCC) [A41.9, R65.20]  Community acquired pneumonia, unspecified laterality [J18.9]       Precautions:                      ASSESSMENT :  MBS completed and patient presents with moderate oral dysphagia and grossly functional pharyngeal stage of the swallow with no penetration/aspiration noted with any amount/consistency.  Oral stage of the swallow characterized by disorganized and delayed bolus prep, mastication, and transport with oral residue remaining, specifically on R side.  Patient is not fully aware of oral residuals, but can respond to cues to swallow again.  Swallow noted to consistently trigger at the valleculae with thin liquids, puree, and solid until final large straw sip, which triggered at the level of the pyriform sinus.  Pharyngeal stage of swallow overall grossly intact but with slightly reduced laryngeal elevation and hyoid excursion.  This led to slight residuals noted in pyriform and valleculae that cleared with second swallow.  Overall, no penetration/aspiration noted with any amount and/or consistency.  Based on these findings, will recommend diet as outlined below.  Will continue to follow for diet tolerance.    Patient will benefit from skilled intervention to address the above impairments.     PLAN :  Recommendations and Planned Interventions:  Diet: Soft and bite sized and thin liquids  --meds crushed in puree as able  --small bites/sips  --fully upright and alert when eating  --good oral care 2-3x/day with toothbrush  --check for pocketing specifically on R side    Recommend next SLP session: diet check    Acute SLP Services: Yes, patient will be followed by speech-language pathology 3x/week to address goals. Patient's rehabilitation potential is considered to be Fair.  Discharge Recommendations: Continue to assess pending  progress     SUBJECTIVE:   Patient stated what?.    OBJECTIVE:     Past Medical History:   Diagnosis Date    Arthritis     CVA (cerebral infarction)     Diabetes (HCC) 01/09/2015    Hemoglobin A1C 6.4 (per records, done at outside facility)    GERD (gastroesophageal reflux disease)     History of recurrent UTIs     on chronic antibiotic prophylaxis, followed by urology    HLD (hyperlipidemia) 01/09/2015    Total Chol 141, Tri 49, HDL 59, LDL 72    Hypertension     Neuropathy     Osteopetrosis     Spinal stenosis      Past Surgical History:   Procedure Laterality Date    ORTHOPEDIC SURGERY      Lumbar laminectomy    ORTHOPEDIC SURGERY      Lumbar fusion     Prior Level of Function/Home Situation:   Social/Functional History  Lives With: Daughter  Type of Home: House  Home Layout: One level  Home Access: Ramped entrance  Bathroom Shower/Tub: Cabin Crew: Tub transfer bench, Grab bars around toilet  Home Equipment: Hospital bed, Wheelchair - Manual, Environmental Consultant - Doctor, General Practice Help From: Family, Personal care attendant  Prior Level of Assist for ADLs: Needs assistance  Toileting: Needs assistance  Prior Level of Assist for Homemaking: Needs assistance  Homemaking Responsibilities: No  Prior Level of Assist for Ambulation: Needs assistance (Stand pivot transfer to wheelchair)  Prior Level of Assist for  Transfers: Needs assistance  Active Driver: No  Mode of Transportation: Car  Diet prior to admission: regular + thin liquids  Current Diet:  NPO     Film views: Lateral  Radiologist: Lovie  Patient position: in [place] chair  The Modified Barium Swallow Impairment Profile: MBSImP  ORAL Impairment  Component 1--Lip Closure: 3=Escape progressing to mid-chin  Component 2--Tongue Control During Bolus Hold: Not assessed  Component 3--Bolus Preparation/Mastication: 2=Disorganized chewing/mashing with solid pieces of bolus unchewed  Component 4--Bolus Transport/Lingual Motion: 3=Repetitive/disorganized  tongue motion  Component 5--Oral Residue: 2=Residue collection on oral structures  Component 6--Initiation of Pharyngeal Swallow: 3=Bolus head in pyriforms    PHARYNGEAL Impairment  Component 7--Soft Palate Elevation: 0=No bolus between soft palate/pharyngeal wall  Component 8--Laryngeal Elevation: 1=Partial superior movement of thyroid cartilage/partial approximation of arytenoids to epiglottic petiole  Component 9--Anterior Hyoid Excursion: 1=Partial anterior movement  Component 10--Epiglottic Movement: 0=Complete inversion  Component 11--Laryngeal Vestibular Closure: 0=Complete; no air/contrast in laryngeal vestibule  Component 12--Pharyngeal Stripping Wave: 0=Present - complete  Component 13--Pharyngeal Contraction: Not assessed  Component 14--Pharyngoesophageal Segment Opening: 0=Complete distension and complete duration; no obstruction of flow  Component 15--Tongue Base Retraction: 1=Trace column of contrast or air between tongue base and pharyngeal wall  Component 16--Pharyngeal Residue: 1=Trace residue within or on pharyngeal structures    ESOPHAGEAL Impairment  Component 17--Esophageal Clearance Upright Position: Not assessed      Penetration-Aspiration Scale (PAS): 1: Contrast does not enter airway       Functional Oral Intake Scale (FOIS): 5--Total oral diet with multiple consistencies but requiring special preparation or compensations    Dysphagia Outcome Severity Scale (DOSS): 6--WFL/modified independence (normal diet/functional swallow; mild oral or pharyngeal delay; retention or trace epiglottic undercoating but independently clears; may need extra time for meals; no aspiration across consistencies)     COMMUNICATION/EDUCATION:   Patient was educated regarding results of MBS.  She demonstrated Poor understanding as evidenced by Limited understanding/response due to cognitive status.     The patient's plan of care including recommendations, planned interventions, and recommended diet changes were  discussed with: Registered nurse        Thank you,  Therisa LELON Hoyle, SLP    SLP Individual Minutes  Time In: 1140  Time Out: 1200  Minutes: 20           Problem: SLP Adult - Impaired Swallowing  Goal: By Discharge: Advance to least restrictive diet without signs or symptoms of aspiration for planned discharge setting.  See evaluation for individualized goals.  Description: Goals initiated on 10/20/24  1. Patient will participate in imaging of swallow with appropriate goals to follow within 7 days. MET 10/20/24  2. Patient will tolerate least restrictive diet with no overt s/s aspiration within 7 days.      10/20/2024 1204 by Hoyle Therisa LELON, SLP  Outcome: Progressing       "

## 2024-10-20 NOTE — Progress Notes (Signed)
"  Spiritual Health Progress Note  [place]      Room # 420/01    Name: Chelsea Schultz           Age: 89 y.o.    Gender: female          MRN: [id number]  Religion: Non-denominational       Preferred Language: English      Date: 10/20/24  Visit Time: Begin Time: 1535 End Time : 1550  Complexity of Encounter: Low      Visit Summary: Chaplain attempted to met patient for spiritual care assessment; in attempt, met family, patients daughters who declined visit at this time; they shared that Pt is having vitals checked now and then the family want to spend time trying to feed Chelsea Schultz, and welcomed visit at another time. Chaplain's plan of care includes follow up at patient's or family's request. Family or visitors were present during visit.      Encounter Overview/Reason: Initial Encounter  Encounter Code:     Crisis (if applicable):    Service Provided For: Family, Patient not available     Patient was not available. Patient was working with other staff/was asleep/ indisposed. Chaplain will follow-up at a later time.    Faith, Belief, Meaning:   Patient unable to assess at this time  Family/Friends unable to assess at this time  Rituals (if applicable)      Importance and Influence:  Patient unable to assess at this time  Family/Friends has spiritual/personal beliefs that influence decisions regarding the patient's health    Community:  Patient   unable to assess at this time   Family/Friends   unable to assess at this time     Assessment and Plan of Care:   Emotions Expressed by Patient:   Assessment: Unable to assess    Interventions by Chaplain:   Intervention: Other (Comment) (Meet & Greet Family)     Result/ Response by Patient:   Outcome: Refused/Declined    Patient Plan of Care:         Emotions Expressed by Spouse/Family/Friends:   calm    Chaplain Interventions with Spouse/ Family/Friends include:   active listening  provided ministry of presence  facilitated life review and/or  legacy    Spouse/Family/Friends Plan of Care:   Spiritual care available upon referral.      Electronically signed by  Elia Lamar Gowers, M.Div., [facility].  For Chaplain support, please call Spiritual Health Team @ [phone]- [phone])    "

## 2024-10-20 NOTE — Progress Notes (Signed)
 "Palliative Medicine    Code Status: Full Code    Advance Care Planning:    Primary Decision Maker: Alyah, Boehning Child [phone]    Primary Decision Maker: Manya [name] JASMINE Child [phone]    Pt does not have AMD on file.  In absence of verified Medical POA, pt's adult children share surrogate decision making authority, with decisions falling to the majority.  Code status was discussed.  Family requests that pt remain Full Code at this time but indicated that they might consider DNR/DNI in the future if condition were to worsen.       Patient / Family Encounter Documentation    Participants (names): Dtrs [name] [name] Bale, and Luke, Palliative Medicine (NP Franchot KEN Setter)     Narrative: Met with family outside of room to allow pt to rest.  Pt is widowed, had six adult children but one dtr is deceased (has four living dtrs, and one son).  Three of pt's children reside in [place] , one resides in [place] , and one in [place] .  Pt has been living with [name] in [place]  since 2011, though spent time with dtr [name] in [place]  during the height of the pandemic.  Pt has paid caregivers in place during the day while dtr is at work, along with additional evening/weekend hours as needed.  Family shared that pt likes to sleep in but family and/or caregivers get pt up every day, assist with activities to keep pt engaged physically and cognitively.      Family members are hopeful that pt will be accepted at [facility] and regain enough strength to return home.  [name] indicated that pt will need to stand/pivot and participate to some degree in her own care in order to return home, shared that family is already giving thought to LTC.  Family submitted a [place]  Medicaid application approximately one month ago and had a UAI completed in the home prior to that, requested assistance checking status of application but also shared that they are considering LTC in [place] , where  pt would have more family members available for support/advocacy.    Hospice was discussed as a possible option to support pt/family in home vs LTC setting if pt's condition does not improve as hoped.  [name] stated home hospice would not be an option, questioned how hospice would benefit pt/family in LTC setting.  Education was provided re: hospice philosophy and services.      Psychosocial Issues Identified/ Resilience Factors:  Family members are supportive of pt and of each other, are working together to make decisions on pt's behalf.  No spiritual concerns identified; Chaplains are available for support as needed.     Caregiver Burden: Moderate; family indicated that home might not be an option unless pt's functional status improves, are considering LTC though are hopeful pt can first regain some strength at IPR.  Does the caregiver feel confident administering medication? Yes  Does the caregiver need any help connecting with community resources? Checking status of Medicaid, rehab, possible LTC  Does the caregiver feel confident assisting with activities of daily living? Yes    Goals of Care / Plan: Continue to treat acute issues in hopes of recovery, family is interested in [facility] if pt is a candidate, are not interested in hospice at this time but might consider in the future.  Discussed with Attending Physician, RN, and Care Manager.  Will follow for ongoing support.    Thank you for including Palliative Medicine in  the care of Ms. Dalto.     [address] Curran, LCSW, ACHP-SW  [phone])  "

## 2024-10-20 NOTE — Plan of Care (Signed)
 "Speech LAnguage Pathology TREATMENT    Patient: Chelsea Schultz (89 y.o. female)  Date: 10/20/2024  Primary Diagnosis: Hypoxemia [R09.02]  Severe sepsis (HCC) [A41.9, R65.20]  Community acquired pneumonia, unspecified laterality [J18.9]       Precautions:                     ASSESSMENT :  Patient seen for swallow re-evaluation after a decline in function over the weekend where nursing made patient NPO.  Per patient's daughter at bedside and on phone, patient with decline in function.  She is very confused, unable to state name and not oriented to place, time or circumstance.  Per daughter, she had a poor night of sleep and is very disoriented.  Assessed with ice chips, thin liquids, and puree.  Patient with no s/s aspiration with ice chips and puree with slow but adequate oral clearance.  Noted to have multiple swallows with thin liquid presentations as well as coughing post intake.  Given s/s of aspiration persisting and patient's CXR on 12/28 nothing Interval clearing of luminary edema superimposed on interstitial fibrosis and Residual asymmetric airspace disease in the right upper lobe and at the left base it appears indicated to further image swallow function for safest diet planning.  Will plan to complete Modified Barium Swallow as able.  Discussed with patient and her daughters.    Patient will benefit from skilled intervention to address the above impairments.     PLAN :  Recommendations and Planned Interventions:  Diet: NPO  --meds via IV route  --may have ice chips/sips of water  after oral care  --strict oral care 2-3x day  --will plan to complete swallow imaging via MBS as able     Recommend next SLP session: MBS    Acute SLP Services: Yes, SLP will continue to follow per plan of care.  Discharge Recommendations: Continue to assess pending progress     SUBJECTIVE:   Patient stated, water .    OBJECTIVE:     Past Medical History:   Diagnosis Date    Arthritis     CVA (cerebral infarction)     Diabetes  (HCC) 01/09/2015    Hemoglobin A1C 6.4 (per records, done at outside facility)    GERD (gastroesophageal reflux disease)     History of recurrent UTIs     on chronic antibiotic prophylaxis, followed by urology    HLD (hyperlipidemia) 01/09/2015    Total Chol 141, Tri 49, HDL 59, LDL 72    Hypertension     Neuropathy     Osteopetrosis     Spinal stenosis      Past Surgical History:   Procedure Laterality Date    ORTHOPEDIC SURGERY      Lumbar laminectomy    ORTHOPEDIC SURGERY      Lumbar fusion     Prior Level of Function/Home Situation:   Social/Functional History  Lives With: Daughter  Type of Home: House  Home Layout: One level  Home Access: Ramped entrance  Bathroom Shower/Tub: Cabin Crew: Tub transfer bench, Grab bars around toilet  Home Equipment: Hospital bed, Wheelchair - Manual, Environmental Consultant - Doctor, General Practice Help From: Family, Personal care attendant  Prior Level of Assist for ADLs: Needs assistance  Toileting: Needs assistance  Prior Level of Assist for Homemaking: Needs assistance  Homemaking Responsibilities: No  Prior Level of Assist for Ambulation: Needs assistance (Stand pivot transfer to wheelchair)  Prior Level of Assist for Transfers: Needs assistance  Active Driver: No  Mode of Transportation: Sales Executive Status:  Neurologic State: Lethargic and Confused  Orientation Level: Disoriented x4  Cognition: Decreased attention/concentration and Decreased command following    Dysphagia:    PO Trials:  Assessment Methods: Observation  Patient Position: Upright in bed  Consistency Presented: Ice chips via spoon, Thin liquids via straw, and Puree (IDDSI 4) via spoon  How Presented: SLP fed    Oral Phase: Impaired  Bolus Acceptance: No impairment  Bolus Formation and Control: Impaired  Type of Impairment: Subjectively delayed  Propulsion: Delayed  Oral Residue: Absent    Pharyngeal Phase: Impaired  Initiation of Swallow: Present  Laryngeal Elevation:  Present  Aspiration Signs/Symptoms: Immediate cough  Other Pharyngeal Observations: Uncued multiple swallows    Overall Bedside Presentation: Subjectively Impaired- further diagnostic treatment warranted when/if appropriate      Respiratory Status/Airway:  Nasal cannula, 2LPM      After treatment:   Patient left in no apparent distress in bed, Call bell left within reach, Nursing notified, and Caregiver present    COMMUNICATION/EDUCATION:   Patient was educated regarding role of SLP, recommendations for MBS.  She demonstrated Poor understanding as evidenced by Limited understanding/response due to cognitive status. Daughter present at bedside and verbalized understanding to education provided.     The patient's plan of care including recommendations, planned interventions, and recommended diet changes were discussed with: Registered nurse        Thank you,  Therisa LELON Hoyle, SLP    SLP Individual Minutes  Time In: [phone]  Time Out: 0900  Minutes: 16     Problem: SLP Adult - Impaired Swallowing  Goal: By Discharge: Advance to least restrictive diet without signs or symptoms of aspiration for planned discharge setting.  See evaluation for individualized goals.  Description: Goals initiated on 10/20/24  1. Patient will participate in imaging of swallow with appropriate goals to follow within 7 days.      Outcome: Progressing     "

## 2024-10-20 NOTE — Progress Notes (Signed)
 "[facility] [facility]  [address] [place], [place] [address]  [phone]        Hospitalist Progress Note      NAME: Chelsea Schultz   DOB:  1926/07/04  MRM:  [id number]    Date/Time of service: 10/20/2024  2:09 PM       Subjective:     Chief Complaint: Pt seen and examined after MBS. No complaints. Denies pain. Per daughter has not slept overnight. Agreed to tylenol  PRN rectally if unable to tolerate PO and IV valium if unable to tolerate PO seroquel        Objective:       Vitals:       Last 24hrs VS reviewed since prior progress note. Most recent are:    Vitals:    10/20/24 0741   BP: (!) 169/86   Pulse: 71   Resp: 16   Temp: 97.9 F (36.6 C)   SpO2: 91%     SpO2 Readings from Last 6 Encounters:   10/20/24 91%   08/19/24 96%   06/26/23 97%   09/05/22 96%   06/19/22 96%   05/11/22 94%          Intake/Output Summary (Last 24 hours) at 10/20/2024 1409  Last data filed at 10/19/2024 2000  Gross per 24 hour   Intake 457.48 ml   Output 300 ml   Net 157.48 ml        Exam:     Physical Exam:    Gen: frail elderly female.   HEENT:  Pink conjunctivae, PERRL, hearing intact to voice, dry mucous membranes  Neck:  Supple, without masses  Resp: less coarse breath sounds  Card:  No murmurs, normal S1, S2 without thrills, bruits or peripheral edema  Abd:  Soft, non-tender, non-distended, normoactive bowel sounds are present  Musc:  No cyanosis or clubbing  Skin:  No rashes  Neuro:  moves all ext, followed some commands  Psych:  no insight    Medications Reviewed: (see below)    Lab Data Reviewed: (see below)    ______________________________________________________________________    Medications:     Current Facility-Administered Medications   Medication Dose Route Frequency    hydrALAZINE (APRESOLINE) injection 10 mg  10 mg IntraVENous Q6H PRN    QUEtiapine  (SEROQUEL ) tablet 12.5 mg  12.5 mg Oral TID PRN    diazePAM (VALIUM) injection 2.5 mg  2.5 mg IntraVENous Q8H PRN    methylPREDNISolone  sodium  succ (SOLU-MEDROL ) 40 mg in sterile water  1 mL injection  40 mg IntraVENous Q12H    insulin  lispro (HUMALOG ,ADMELOG ) injection vial 0-8 Units  0-8 Units SubCUTAneous Q6H    metoprolol (LOPRESSOR) injection 2.5 mg  2.5 mg IntraVENous Q6H    guaiFENesin (ROBITUSSIN) 100 MG/5ML liquid 200 mg  200 mg Oral Q4H PRN    famotidine  (PEPCID ) 20 MG/2ML 20 mg in sodium chloride  (PF) 0.9 % 10 mL injection  20 mg IntraVENous Daily    glucose chewable tablet 16 g  4 tablet Oral PRN    dextrose  bolus 10% 125 mL  125 mL IntraVENous PRN    Or    dextrose  bolus 10% 250 mL  250 mL IntraVENous PRN    glucagon  injection 1 mg  1 mg SubCUTAneous PRN    dextrose  10 % infusion   IntraVENous Continuous PRN    ampicillin -sulbactam (UNASYN ) 3,000 mg in sodium chloride  0.9 % 100 mL IVPB (addEASE)  3,000 mg IntraVENous Q6H  ipratropium 0.5 mg-albuterol  2.5 mg (DUONEB ) nebulizer solution 1 Dose  1 Dose Inhalation Q4H PRN    prochlorperazine  (COMPAZINE ) injection 10 mg  10 mg IntraVENous Q6H PRN    sodium chloride  flush 0.9 % injection 5-40 mL  5-40 mL IntraVENous 2 times per day    sodium chloride  flush 0.9 % injection 5-40 mL  5-40 mL IntraVENous PRN    0.9 % sodium chloride  infusion   IntraVENous PRN    enoxaparin  Sodium (LOVENOX ) injection 30 mg  30 mg SubCUTAneous Daily    acetaminophen  (TYLENOL ) suppository 650 mg  650 mg Rectal Q6H PRN    melatonin tablet 6 mg  6 mg Oral Nightly PRN    bisacodyl  (DULCOLAX) EC tablet 5 mg  5 mg Oral Daily PRN    albuterol  (PROVENTIL ) (2.5 MG/3ML) 0.083% nebulizer solution 2.5 mg  2.5 mg Nebulization Q4H PRN    benzonatate  (TESSALON ) capsule 100 mg  100 mg Oral TID PRN          Lab Review:     Recent Labs     10/18/24  0212 10/19/24  0614 10/20/24  0110   WBC 12.6* 16.6* 17.4*   HGB 9.9* 10.4* 10.0*   HCT 30.3* 32.4* 30.9*   PLT 483* 512* 540*     Recent Labs     10/18/24  0212 10/19/24  0614 10/20/24  0110 10/20/24  0220 10/20/24  0536   NA 142 139 135* 135* 137   K 3.9 4.1 Hemolyzed, Recollection  Recommended 5.3* 3.5   CL 105 103 100 99 99   CO2 31* 32* 26 27 27    BUN 23 21 22  24* 24*   MG 1.8 1.9  --   --   --    PHOS 2.7 2.2*  --   --   --    ALT  --   --   --  8* 6*     No results found for: GLUCPOC       Assessment / Plan:   89 yo Hx of HTN, DM, interstitial lung dz, dementia, presented w/ AMS, resp failure, hypoxia, pulm fibrosis, enterococcus UTI    1) Acute resp failure/hypoxia: 2/2 pulmonary fibrosis, aspiration PNA/pneumonitis   -continue steroids; taper. Now able to tolerate PO. Transition to PO pred  -s/p IV antibiotics; transition to PO Augmentin    -appreciate speech; MBS completed   -continue goals of care discussions; at 89yo pt is frail with pulmonary fibrosis, recurrent aspiration events and dementia; very appropriate for hospice; palliative care on board and goals of care meeting planned for 2PM today    2) Pneumonia: due to location on imaging, suspect aspiration   -on unasyn ; transition to Augmentin      3) Acute met encephalopathy: due to hypoxia, UTI, with underlying dementia.  Head CT without acute process  -monitor     4) Pleuritic chest pain: possibly 2/2 PNA/pneumonitis. CTA without e/o PE. Resolved. Monitor     5) UTI: culture with Enterococcus   -transition to Augmentin      6) Hypercalcemia: likely from dehydration  -PTH WNL   -pending vitamin D and PTHrP     7) DM type 2: A1C 7.0%  -ISS   -BG checks AC q6h while NPO  -resume Lantus      8) HTN:   -resume norvasc , coreg   -if needed resume ARB  -PRN hydralazine     9) Abnormal thyroid function: free T4 WNL. TSH low. Subclinical hyperthyroidism. Repeat TFT's in 6 weeks.  10) Dementia:   -continue goals of care discussions     FULL CODE     **Prior records, notes, labs, radiology, and medications reviewed in [facility]**    Total time spent with patient care: 29 Minutes                  **I personally saw and examined the patient during this time period**                 Care Plan discussed with: Patient, nursing, family      Discussed:  Care Plan    Prophylaxis:  Lovenox     Disposition: TBD           ___________________________________________________    Attending Physician: Consepcion Coombs, MD     "

## 2024-10-20 NOTE — Progress Notes (Addendum)
"  Occupational and Physical Therapy    Chart reviewed and attempted to see patient for therapy. Discussed with RN who reports that family are requesting to defer therapy this morning d/t patient not sleeping well last night. Transport also at bedside to take patient for MBS.     Chelsea Schultz, OTR/L    "

## 2024-10-21 ENCOUNTER — Inpatient Hospital Stay: Admit: 2024-10-21 | Payer: MEDICARE

## 2024-10-21 LAB — CBC WITH AUTO DIFFERENTIAL
Basophils %: 0.1 % (ref 0.0–1.0)
Basophils Absolute: 0.03 K/UL (ref 0.00–0.10)
Eosinophils %: 2.2 % (ref 0.0–7.0)
Eosinophils Absolute: 0.45 K/UL — ABNORMAL HIGH (ref 0.00–0.40)
Hematocrit: 33.5 % — ABNORMAL LOW (ref 35.0–47.0)
Hemoglobin: 10.5 g/dL — ABNORMAL LOW (ref 11.5–16.0)
Immature Granulocytes %: 0.9 % — ABNORMAL HIGH (ref 0.0–0.5)
Immature Granulocytes Absolute: 0.19 K/UL — ABNORMAL HIGH (ref 0.00–0.04)
Lymphocytes %: 9.8 % — ABNORMAL LOW (ref 12.0–49.0)
Lymphocytes Absolute: 2.05 K/UL (ref 0.80–3.50)
MCH: 26.3 pg (ref 26.0–34.0)
MCHC: 31.3 g/dL (ref 30.0–36.5)
MCV: 83.8 FL (ref 80.0–99.0)
MPV: 9.4 FL (ref 8.9–12.9)
Monocytes %: 7.6 % (ref 5.0–13.0)
Monocytes Absolute: 1.58 K/UL — ABNORMAL HIGH (ref 0.00–1.00)
Neutrophils %: 79.4 % — ABNORMAL HIGH (ref 32.0–75.0)
Neutrophils Absolute: 16.53 K/UL — ABNORMAL HIGH (ref 1.80–8.00)
Nucleated RBCs: 0.1 /100{WBCs} — ABNORMAL HIGH
Platelets: 511 K/uL — ABNORMAL HIGH (ref 150–400)
RBC: 4 M/uL (ref 3.80–5.20)
RDW: 12.6 % (ref 11.5–14.5)
WBC: 20.8 K/uL — ABNORMAL HIGH (ref 3.6–11.0)
nRBC: 0.02 K/uL — ABNORMAL HIGH (ref 0.00–0.01)

## 2024-10-21 LAB — POCT GLUCOSE
POC Glucose: 246 mg/dL — ABNORMAL HIGH (ref 65–117)
POC Glucose: 130 mg/dL — ABNORMAL HIGH (ref 65–117)
POC Glucose: 182 mg/dL — ABNORMAL HIGH (ref 65–117)
POC Glucose: 187 mg/dL — ABNORMAL HIGH (ref 65–117)

## 2024-10-21 LAB — BASIC METABOLIC PANEL
Anion Gap: 5 mmol/L (ref 2–14)
BUN/Creatinine Ratio: 41 — ABNORMAL HIGH (ref 12–20)
BUN: 25 mg/dL — ABNORMAL HIGH (ref 8–23)
CO2: 31 mmol/L — ABNORMAL HIGH (ref 20–29)
Calcium: 9.9 mg/dL — ABNORMAL HIGH (ref 8.2–9.6)
Chloride: 97 mmol/L — ABNORMAL LOW (ref 98–107)
Creatinine: 0.6 mg/dL (ref 0.60–1.00)
Est, Glom Filt Rate: 82 ml/min/1.73m2 (ref >59)
Glucose: 178 mg/dL — ABNORMAL HIGH (ref 65–100)
Potassium: 3.3 mmol/L — ABNORMAL LOW (ref 3.5–5.1)
Sodium: 133 mmol/L — ABNORMAL LOW (ref 136–145)

## 2024-10-21 LAB — BRAIN NATRIURETIC PEPTIDE: NT Pro-BNP: 491 pg/mL — ABNORMAL HIGH (ref 0–450)

## 2024-10-21 LAB — MAGNESIUM: Magnesium: 1.8 mg/dL (ref 1.7–2.3)

## 2024-10-21 LAB — ARTERIAL BLOOD GAS, POC
Base Excess: 5.7 mmol/L
POC Allen's Test: POSITIVE
POC HCO3: 30.4 MMOL/L — ABNORMAL HIGH (ref 21–28)
POC O2 SAT: 93.1 % (ref 92–97)
POC PO2: 65 mmHg — ABNORMAL LOW (ref 83–108)
POC pCO2: 44 mmHg (ref 35.0–48.0)
POC pH: 7.45 (ref 7.35–7.45)

## 2024-10-21 LAB — PHOSPHORUS: Phosphorus: 1.5 mg/dL — ABNORMAL LOW (ref 2.5–4.5)

## 2024-10-21 LAB — VITAMIN D 25 HYDROXY: Vit D, 25-Hydroxy: 24.8 ng/mL — ABNORMAL LOW (ref 30.0–100.0)

## 2024-10-21 MED ORDER — PIPERACILLIN SOD-TAZOBACTAM SO 3.375 (3-0.375) G IV SOLR
3.375 | Freq: Four times a day (QID) | INTRAVENOUS | Status: DC
Start: 2024-10-21 — End: 2024-10-21

## 2024-10-21 MED ORDER — PIPERACILLIN SOD-TAZOBACTAM SO 3.375 (3-0.375) G IV SOLR
3.375 | Freq: Three times a day (TID) | INTRAVENOUS | Status: DC
Start: 2024-10-21 — End: 2024-10-23
  Administered 2024-10-22 – 2024-10-23 (×5): 3375 mg via INTRAVENOUS

## 2024-10-21 MED ORDER — POTASSIUM BICARB-CITRIC ACID 20 MEQ PO TBEF
20 | Freq: Once | ORAL | Status: AC
Start: 2024-10-21 — End: 2024-10-21
  Administered 2024-10-21: 14:00:00 40 meq via ORAL

## 2024-10-21 MED ORDER — VITAMIN D 25 MCG (1000 UT) PO TABS
25 | Freq: Every day | ORAL | Status: DC
Start: 2024-10-21 — End: 2024-10-21
  Administered 2024-10-21: 14:00:00 1000 [IU] via ORAL

## 2024-10-21 MED ORDER — STERILE WATER FOR INJECTION (MIXTURES ONLY)
40 | Freq: Every day | INTRAMUSCULAR | Status: DC
Start: 2024-10-21 — End: 2024-10-24
  Administered 2024-10-21 – 2024-10-22 (×2): 40 mg via INTRAVENOUS

## 2024-10-21 MED ORDER — INSULIN LISPRO 100 UNIT/ML IJ SOLN
100 | Freq: Four times a day (QID) | INTRAMUSCULAR | Status: DC
Start: 2024-10-21 — End: 2024-10-23
  Administered 2024-10-21 – 2024-10-22 (×2): 2 [IU] via SUBCUTANEOUS

## 2024-10-21 MED ORDER — LOSARTAN POTASSIUM 25 MG PO TABS
25 | Freq: Every day | ORAL | Status: DC
Start: 2024-10-21 — End: 2024-10-21
  Administered 2024-10-21: 14:00:00 25 mg via ORAL

## 2024-10-21 MED ORDER — MAGNESIUM SULFATE 2000 MG/50 ML IVPB PREMIX
2 | Freq: Once | INTRAVENOUS | Status: AC
Start: 2024-10-21 — End: 2024-10-21
  Administered 2024-10-21: 14:00:00 2000 mg via INTRAVENOUS

## 2024-10-21 MED ORDER — LACTATED RINGERS IV SOLN
INTRAVENOUS | Status: DC
Start: 2024-10-21 — End: 2024-10-24
  Administered 2024-10-21: 18:00:00 via INTRAVENOUS

## 2024-10-21 MED ORDER — PIPERACILLIN SOD-TAZOBACTAM SO 4.5 (4-0.5) G IV SOLR
4.5 | Freq: Once | INTRAVENOUS | Status: AC
Start: 2024-10-21 — End: 2024-10-21
  Administered 2024-10-21: 20:00:00 4500 mg via INTRAVENOUS

## 2024-10-21 MED FILL — COZAAR 25 MG PO TABS: 25 mg | ORAL | Qty: 1 | Fill #0

## 2024-10-21 MED FILL — AMOXICILLIN-POT CLAVULANATE 500-125 MG PO TABS: 500-125 mg | ORAL | Qty: 1 | Fill #0

## 2024-10-21 MED FILL — MELATONIN 3 MG PO TABS: 3 mg | ORAL | Qty: 2 | Fill #0

## 2024-10-21 MED FILL — VITAMIN D3 25 MCG (1000 UT) PO TABS: 25 MCG (1000 UT) | ORAL | Qty: 1 | Fill #0

## 2024-10-21 MED FILL — MAGNESIUM SULFATE 2 GM/50ML IV SOLN: 2 GM/50ML | INTRAVENOUS | Qty: 50 | Fill #0

## 2024-10-21 MED FILL — FAMOTIDINE 20 MG PO TABS: 20 mg | ORAL | Qty: 1 | Fill #0

## 2024-10-21 MED FILL — CARVEDILOL 3.125 MG PO TABS: 3.125 mg | ORAL | Qty: 1 | Fill #0

## 2024-10-21 MED FILL — METHYLPREDNISOLONE SODIUM SUCC 40 MG IJ SOLR: 40 mg | INTRAMUSCULAR | Qty: 40 | Fill #0

## 2024-10-21 MED FILL — PREDNISONE 20 MG PO TABS: 20 mg | ORAL | Qty: 2 | Fill #0

## 2024-10-21 MED FILL — ENOXAPARIN SODIUM 30 MG/0.3ML IJ SOSY: 30 MG/0.3ML | INTRAMUSCULAR | Qty: 0.3 | Fill #0

## 2024-10-21 MED FILL — ACETAMINOPHEN 650 MG RE SUPP: 650 mg | RECTAL | Qty: 1 | Fill #0

## 2024-10-21 MED FILL — INSULIN LISPRO 100 UNIT/ML IJ SOLN: 100 [IU]/mL | INTRAMUSCULAR | Qty: 2 | Fill #0

## 2024-10-21 MED FILL — EFFER-K 20 MEQ PO TBEF: 20 meq | ORAL | Qty: 2 | Fill #0

## 2024-10-21 MED FILL — LACTATED RINGERS IV SOLN: INTRAVENOUS | Qty: 1000 | Fill #0

## 2024-10-21 MED FILL — PIPERACILLIN SOD-TAZOBACTAM SO 4.5 (4-0.5) G IV SOLR: 4.5 (4-0.5) g | INTRAVENOUS | Qty: 4500 | Fill #0

## 2024-10-21 MED FILL — LANTUS 100 UNIT/ML SC SOLN: 100 [IU]/mL | SUBCUTANEOUS | Qty: 5 | Fill #0

## 2024-10-21 MED FILL — AMLODIPINE BESYLATE 5 MG PO TABS: 5 mg | ORAL | Qty: 2 | Fill #0

## 2024-10-21 NOTE — Progress Notes (Cosign Needed)
 "[facility]     Name: Chelsea Schultz MRN: [id number]   DOB: Apr 08, 1926 Hospital: [facility]   Date: 10/21/2024        Impression Plan   Acute respiratory failure  Hypoxia  Bilateral PNA  Hypoxia  ILD exacerbation  Pulmonary fibrosis- UIP  GERD  Leukocytosis  HxofCVA  Delirium  Dysphagia, but no aspiration/penetration per MBS  UTI               Continue to wean O2 as tolerated, currently on 2LNC o2  ABG ordered given increased unresponsiveness after morning meds per family at bedside; ABG reviewed and is unremarkable.  Continue antibiotics; noted she was transitioned to Augmentin  on 12/29 per hospitalist. May need to be switched back to IV, and consider escalation given worsening leukocytosis and RUL PNA on repeat CXR this AM   Check PNA labs- mrsa screen, strep/legionella urine Ag  Agree with weaning steroids  RVP negative   BP control  OOB as tolerated, attempt to have her sitting in high Fowlers  Diet as per speech therapy (soft bite sized/thin liquids) once more alert  Palliative care is following- ongoing goals of care discussion with family who continues to want current measures and full code for now.               Radiology  ( personally reviewed) CTA chest 10/11/24: Bilateral pulmonary fibrosis in UIP pattern with superimposed  GG infiltrates   ABG Invalid input(s): PHI, PO2I, PCO2I       Subjective     Cc: abnormal CT chest    89 yo with PMHx of dementia, CVA, spinal stenosis, chronic debilitation and pulmonary fibrosis who presented to the ER on 12/19 with increased somnolence and  weakness. Pt was found to be hypoxic. CT chest revealed bilateral GG infiltrates superimposed on known pulmonary fibrosis. [facility] with plan for PFTs and repeat CT chest, but did not return for follow up. Pt is altered. Wakes up and answers some questions. Daughter denies any hx of GERD/episodes of vomiting. Denies any witnessed choking or aspiration episodes. Never  smoker.     12/23: Arousable to voice, able to state name and open eyes.  On 3.5 L, satting 91%. No leukocytosis on a.m. labs.  10-17-24: remains on 5 LPM.  Having issues with pulling off her O2 due to delirium.    10/20/24: On 1.5LNC o2. She denies any SOB.   10/21/24: Unresponsive to verbal and tactile stimuli. Family notes unresponsiveness after pt was able to take her morning meds. On 2LNC o2.    Review of Systems:  Review of systems not obtained due to patient factors.    Past Medical History:   Diagnosis Date    Arthritis     CVA (cerebral infarction)     Diabetes (HCC) 01/09/2015    Hemoglobin A1C 6.4 (per records, done at outside facility)    GERD (gastroesophageal reflux disease)     History of recurrent UTIs     on chronic antibiotic prophylaxis, followed by urology    HLD (hyperlipidemia) 01/09/2015    Total Chol 141, Tri 49, HDL 59, LDL 72    Hypertension     Neuropathy     Osteopetrosis     Spinal stenosis       Past Surgical History:   Procedure Laterality Date    ORTHOPEDIC SURGERY      Lumbar laminectomy    ORTHOPEDIC SURGERY  Lumbar fusion      Prior to Admission medications   Medication Sig Start Date End Date Taking? Authorizing Provider   pregabalin  (LYRICA ) 100 MG capsule TAKE 1 CAPSULE BY MOUTH DAILY AS NEEDED FOR NERVE PAIN  Patient taking differently: Take 1 capsule by mouth daily. TAKE 1 CAPSULE BY MOUTH DAILY FOR NERVE PAIN 09/16/24 12/17/24 Yes Uhlar, Adrianna K, MD   amLODIPine  (NORVASC ) 10 MG tablet TAKE 1 TABLET(10 MG) BY MOUTH EVERY NIGHT  Patient taking differently: Take 1 tablet by mouth daily TAKE 1 TABLET(10 MG) BY MOUTH EVERY NIGHT 07/17/24  Yes Ledwith, Alexandra G, MD   mirabegron  (MYRBETRIQ ) 50 MG TB24 Take 50 mg by mouth daily 06/26/23  Yes Ledwith, Alexandra G, MD   metFORMIN  (GLUCOPHAGE ) 500 MG tablet TAKE 1 TABLET BY MOUTH TWICE DAILY WITH A MEAL 09/02/24   Ledwith, Alexandra G, MD   famotidine  (PEPCID ) 40 MG tablet TAKE 1/2 TABLET BY MOUTH EVERY MORNING AND 1/2 TABLET AT  BEDTIME 08/21/24   Ledwith, Alexandra G, MD   losartan  (COZAAR ) 100 MG tablet TAKE 1 TABLET BY MOUTH DAILY 08/21/24 08/16/25  Ledwith, Alexandra G, MD   Vaginal Lubricant (VAGISIL LUBRICANT) GEL Place vaginally    [provider]   vitamin B-12 (CYANOCOBALAMIN) 1000 MCG tablet     [provider]   Cholecalciferol  (VITAMIN D -3 PO) Take by mouth    [provider]   polyethylene glycol (GLYCOLAX ) 17 GM/SCOOP powder Take 17 g by mouth daily as needed (constipation) 08/01/24   Uhlar, Adrianna K, MD   atorvastatin  (LIPITOR ) 10 MG tablet Take 1 tablet by mouth daily 08/01/24   Uhlar, Adrianna K, MD   carvedilol  (COREG ) 3.125 MG tablet Take 1 tablet by mouth 2 times daily 08/01/24   Uhlar, Adrianna K, MD   Diabetic Shoe MISC by Does not apply route  Patient not taking: Reported on 08/19/2024 08/01/24   Guadelupe Mace POUR, MD   METAMUCIL FIBER PO Take by mouth    [provider]   trospium  (SANCTURA ) 20 MG tablet Take 1 tablet by mouth daily    [provider]   acetaminophen  (TYLENOL ) 650 MG extended release tablet Take 1 tablet by mouth daily as needed    Automatic Reconciliation, Ar   ascorbic acid (VITAMIN C) 1000 MG tablet Take 1 tablet by mouth daily    Automatic Reconciliation, Ar   aspirin 81 MG EC tablet ceived the following from [facility]: Outside name: aspirin delayed-release 81 mg tablet 10/31/21   Automatic Reconciliation, Ar   diclofenac sodium (VOLTAREN) 1 % GEL Apply 2 g topically 4 times daily    Automatic Reconciliation, Ar   docusate (COLACE, DULCOLAX) 100 MG CAPS Take 100 mg by mouth daily  Patient not taking: Reported on 08/19/2024    Automatic Reconciliation, Ar   Estradiol (VAGIFEM) 10 MCG TABS vaginal tablet Place 1 tablet vaginally  Patient not taking: Reported on 08/19/2024 12/01/21 12/01/22  Automatic Reconciliation, Ar     @CMEDSCH @  Allergies   Allergen Reactions    Nitrofurantoin Rash     Was placed on it 09/2019 and pt noted rash on her  hand immediately after starting antibx. Will add it back to her allergy list.      Social History     Tobacco Use    Smoking status: Never    Smokeless tobacco: Never   Substance Use Topics    Alcohol use: No      Family History  Problem Relation Age of Onset    Stroke Father     Cancer Sister     Hypertension Father     Hypertension Mother     Stroke Mother     Diabetes Mother     Stroke Sister           Laboratory: I have personally reviewed the critical care flowsheet and labs.     Recent Labs     10/19/24  0614 10/20/24  0110 10/21/24  0022   WBC 16.6* 17.4* 20.8*   HGB 10.4* 10.0* 10.5*   HCT 32.4* 30.9* 33.5*   PLT 512* 540* 511*     Recent Labs     10/19/24  0614 10/20/24  0110 10/20/24  0220 10/20/24  0536 10/21/24  0022   NA 139   < > 135* 137 133*   K 4.1   < > 5.3* 3.5 3.3*   CL 103   < > 99 99 97*   CO2 32*   < > 27 27 31*   BUN 21   < > 24* 24* 25*   MG 1.9  --   --   --  1.8   PHOS 2.2*  --   --   --  1.5*   ALT  --   --  8* 6*  --     < > = values in this interval not displayed.       Objective:     Mode Rate Tidal Volume Pressure FiO2 PEEP                    Vital Signs:     TMAX(24)      Intake/Output:   Last shift:         Last 3 shifts: 12/30 0701 - 12/30 1900  In: 10 [I.V.:10]  Out: - RRIOLAST3  Intake/Output Summary (Last 24 hours) at 10/21/2024 1109  Last data filed at 10/21/2024 9096  Gross per 24 hour   Intake 689.98 ml   Output 500 ml   Net 189.98 ml     EXAM:   GENERAL:no acute distress HEENT:  EOMI, no alar flaring or epistaxis, oral mucosa moist without cyanosis, LUNGS: CTA, no w/r/r HEART:  Regular rate and rhythm with no MGR; no edema is present, ABDOMEN:  soft with no tenderness, EXTREMITIES:  warm with no cyanosis, SKIN:  no jaundice or ecchymosis, and NEUROLOGIC:  unresponsive to verbal and tactile stimuli,     Personally reviewed CXR imaging and radiology report:   12/30 CXR: lung volumes continue to be low with bilateral interstitial airspace opacities right greater than left.      "

## 2024-10-21 NOTE — Plan of Care (Signed)
 "Speech LAnguage Pathology TREATMENT    Patient: [facility] (89 y.o. female)  Date: 10/21/2024  Primary Diagnosis: Hypoxemia [R09.02]  Severe sepsis (HCC) [A41.9, R65.20]  Community acquired pneumonia, unspecified laterality [J18.9]       Precautions:                     ASSESSMENT :  Patient poorly alert this date.   With cool wash cloth and spoon presents patient with absent response.  Patient is not safe for  PO  given  poor alertness and NPO is safest for now.    Patient will benefit from skilled intervention to address the above impairments.     PLAN :  Recommendations and Planned Interventions:  Diet: NPO  Frequent oral  care    Acute SLP Services: Yes, SLP will continue to follow per plan of care.  Discharge Recommendations: Continue to assess pending progress     SUBJECTIVE:   Patient in bed,  not  alert,  no command following.  Multiple family members at bedside.    OBJECTIVE:     Past Medical History:   Diagnosis Date    Arthritis     CVA (cerebral infarction)     Diabetes (HCC) 01/09/2015    Hemoglobin A1C 6.4 (per records, done at outside facility)    GERD (gastroesophageal reflux disease)     History of recurrent UTIs     on chronic antibiotic prophylaxis, followed by urology    HLD (hyperlipidemia) 01/09/2015    Total Chol 141, Tri 49, HDL 59, LDL 72    Hypertension     Neuropathy     Osteopetrosis     Spinal stenosis      Past Surgical History:   Procedure Laterality Date    ORTHOPEDIC SURGERY      Lumbar laminectomy    ORTHOPEDIC SURGERY      Lumbar fusion     Prior Level of Function/Home Situation:   Social/Functional History  Lives With: Daughter  Type of Home: House  Home Layout: One level  Home Access: Ramped entrance  Bathroom Shower/Tub: Cabin Crew: Tub transfer bench, Grab bars around toilet  Home Equipment: Hospital bed, Wheelchair - Manual, Environmental Consultant - Doctor, General Practice Help From: Family, Personal care attendant  Prior Level of Assist for ADLs: Needs  assistance  Toileting: Needs assistance  Prior Level of Assist for Homemaking: Needs assistance  Homemaking Responsibilities: No  Prior Level of Assist for Ambulation: Needs assistance (Stand pivot transfer to wheelchair)  Prior Level of Assist for Transfers: Needs assistance  Active Driver: No  Mode of Transportation: Lawyer and Communication Status:  Neurologic State: Lethargic    Dysphagia:  Oral Assessment:              P.O. Trials:  PO Trials  Assessment Method(s): Observation  Patient Position: Upright in bed  Consistency Presented: Ice Chips  How Presented: SLP-fed/Presented  Bolus Acceptance: Impaired  Bolus Formation/Control: Impaired  Propulsion: absent             Respiratory Status/Airway:  Nasal cannula, 2LPM                         After treatment:   Patient left in no apparent distress in bed, Call bell left within reach, and Caregiver present    COMMUNICATION/EDUCATION:   Patient was educated regarding role of SLP and POC.  She demonstrated  Poor understanding as evidenced by no response. Family present at bedside and verbalized understanding to education provided.     The patient's plan of care including recommendations, planned interventions, and recommended diet changes were discussed with:     Patient is unable to participate in goal setting and plan of care    Thank you,  Wonda GORMAN Southerly, SLP    SLP Individual Minutes  Time In: 1530  Time Out: 1539  Minutes: 9    Problem: SLP Adult - Impaired Swallowing  Goal: By Discharge: Advance to least restrictive diet without signs or symptoms of aspiration for planned discharge setting.  See evaluation for individualized goals.  Description: Goals initiated on 10/20/24  1. Patient will participate in imaging of swallow with appropriate goals to follow within 7 days. MET 10/20/24  2. Patient will tolerate least restrictive diet with no overt s/s aspiration within 7 days.      Outcome: Not Progressing     "

## 2024-10-21 NOTE — ACP (Advance Care Planning) (Signed)
"  Discussed with pt's daughters at the bedside [facility] and [facility]). Since starting diet as per speech recs based on MBS, pt now with uptrending WBC count and interval progression of PNA on imaging. Also less responsive. Discussed concerns with continuing PO at this time so changed to NPO. Further discussed MD concerns with performing CPR/code status on a 89yo frail female likely with osteoporosis including rib fractures, pain, bleeding, lung perforation. [facility] stated that she will discuss code status with additional family members and get back to the team regarding decisions. Re-iterated that do not feel that compressions would be in pt's best interest and VERY likely to cause harm and suffering. Family expressed understanding. 16 minutes spent  "

## 2024-10-21 NOTE — Progress Notes (Addendum)
"  Ultrasound IV by Lauraine Austin, RN :  Procedure Note    Ultrasound IV education provided to patient. Opportunities for questions given.     Ultrasound used for PIV placement:  20 gauge 1.25 in BD Nexiva  L upper arm location.  1 X Attempt(s).    Flushed with ease; vigorous blood return.     Procedure tolerated well. Primary RN aware of IV placement and added to LDA.      Sarah Jagielski, RN   "

## 2024-10-21 NOTE — Plan of Care (Signed)
 "  Problem: Skin/Tissue Integrity  Goal: Skin integrity remains intact  Description: 1.  Monitor for areas of redness and/or skin breakdown  2.  Assess vascular access sites hourly  3.  Every 4-6 hours minimum:  Change oxygen saturation probe site  4.  Every 4-6 hours:  If on nasal continuous positive airway pressure, respiratory therapy assess nares and determine need for appliance change or resting period  10/21/2024 0458 by Timmie Pica, RN  Outcome: Progressing  10/20/2024 1828 by Rigoberto Gauze, RN  Outcome: Progressing     Problem: Safety - Adult  Goal: Free from fall injury  10/21/2024 0458 by Timmie Pica, RN  Outcome: Progressing  10/20/2024 1828 by Rigoberto Gauze, RN  Outcome: Progressing     Problem: Chronic Conditions and Co-morbidities  Goal: Patient's chronic conditions and co-morbidity symptoms are monitored and maintained or improved  10/21/2024 0458 by Timmie Pica, RN  Outcome: Progressing  10/20/2024 1828 by Rigoberto Gauze, RN  Outcome: Progressing     Problem: Discharge Planning  Goal: Discharge to home or other facility with appropriate resources  10/21/2024 0458 by Timmie Pica, RN  Outcome: Progressing  10/20/2024 1828 by Rigoberto Gauze, RN  Outcome: Progressing     Problem: Respiratory - Adult  Goal: Achieves optimal ventilation and oxygenation  10/21/2024 0458 by Timmie Pica, RN  Outcome: Progressing  10/20/2024 1828 by Rigoberto Gauze, RN  Outcome: Progressing     Problem: Neurosensory - Adult  Goal: Achieves stable or improved neurological status  10/21/2024 0458 by Timmie Pica, RN  Outcome: Progressing  10/20/2024 1828 by Rigoberto Gauze, RN  Outcome: Progressing  Goal: Achieves maximal functionality and self care  10/21/2024 0458 by Timmie Pica, RN  Outcome: Progressing  10/20/2024 1828 by Rigoberto Gauze, RN  Outcome: Progressing     Problem: Cardiovascular - Adult  Goal: Maintains optimal cardiac output and hemodynamic stability  10/21/2024 0458 by Timmie Pica, RN  Outcome: Progressing  10/20/2024  1828 by Rigoberto Gauze, RN  Outcome: Progressing  Goal: Absence of cardiac dysrhythmias or at baseline  10/21/2024 0458 by Timmie Pica, RN  Outcome: Progressing  10/20/2024 1828 by Rigoberto Gauze, RN  Outcome: Progressing     Problem: Skin/Tissue Integrity - Adult  Goal: Skin integrity remains intact  Description: 1.  Monitor for areas of redness and/or skin breakdown  2.  Assess vascular access sites hourly  3.  Every 4-6 hours minimum:  Change oxygen saturation probe site  4.  Every 4-6 hours:  If on nasal continuous positive airway pressure, respiratory therapy assess nares and determine need for appliance change or resting period  10/21/2024 0458 by Timmie Pica, RN  Outcome: Progressing  10/20/2024 1828 by Rigoberto Gauze, RN  Outcome: Progressing  Goal: Incisions, wounds, or drain sites healing without S/S of infection  10/21/2024 0458 by Timmie Pica, RN  Outcome: Progressing  10/20/2024 1828 by Rigoberto Gauze, RN  Outcome: Progressing  Goal: Oral mucous membranes remain intact  10/21/2024 0458 by Timmie Pica, RN  Outcome: Progressing  10/20/2024 1828 by Rigoberto Gauze, RN  Outcome: Progressing     Problem: Gastrointestinal - Adult  Goal: Minimal or absence of nausea and vomiting  10/21/2024 0458 by Timmie Pica, RN  Outcome: Progressing  10/20/2024 1828 by Rigoberto Gauze, RN  Outcome: Progressing  Goal: Maintains or returns to baseline bowel function  10/21/2024 0458 by Timmie Pica, RN  Outcome: Progressing  10/20/2024 1828 by Rigoberto Gauze, RN  Outcome: Progressing  Goal: Maintains adequate nutritional intake  10/21/2024  9541 by Timmie Pica, RN  Outcome: Progressing  10/20/2024 1828 by Rigoberto Gauze, RN  Outcome: Progressing     Problem: Genitourinary - Adult  Goal: Absence of urinary retention  10/21/2024 0458 by Timmie Pica, RN  Outcome: Progressing  10/20/2024 1828 by Rigoberto Gauze, RN  Outcome: Progressing     Problem: Infection - Adult  Goal: Absence of infection at discharge  10/21/2024 0458 by Timmie Pica,  RN  Outcome: Progressing  10/20/2024 1828 by Rigoberto Gauze, RN  Outcome: Progressing  Goal: Absence of infection during hospitalization  10/21/2024 0458 by Timmie Pica, RN  Outcome: Progressing  10/20/2024 1828 by Rigoberto Gauze, RN  Outcome: Progressing     Problem: Metabolic/Fluid and Electrolytes - Adult  Goal: Electrolytes maintained within normal limits  10/21/2024 0458 by Timmie Pica, RN  Outcome: Progressing  10/20/2024 1828 by Rigoberto Gauze, RN  Outcome: Progressing  Goal: Hemodynamic stability and optimal renal function maintained  10/21/2024 0458 by Timmie Pica, RN  Outcome: Progressing  10/20/2024 1828 by Rigoberto Gauze, RN  Outcome: Progressing  Goal: Glucose maintained within prescribed range  10/21/2024 0458 by Timmie Pica, RN  Outcome: Progressing  10/20/2024 1828 by Rigoberto Gauze, RN  Outcome: Progressing     Problem: Hematologic - Adult  Goal: Maintains hematologic stability  10/21/2024 0458 by Timmie Pica, RN  Outcome: Progressing  10/20/2024 1828 by Rigoberto Gauze, RN  Outcome: Progressing     Problem: Confusion  Goal: Confusion, delirium, dementia, or psychosis is improved or at baseline  Description: INTERVENTIONS:  1. Assess for possible contributors to thought disturbance, including medications, impaired vision or hearing, underlying metabolic abnormalities, dehydration, psychiatric diagnoses, and notify attending LIP  2. Institute high risk fall precautions, as indicated  3. Provide frequent short contacts to provide reality reorientation, refocusing and direction  4. Decrease environmental stimuli, including noise as appropriate  5. Monitor and intervene to maintain adequate nutrition, hydration, elimination, sleep and activity  6. If unable to ensure safety without constant attention obtain sitter and review sitter guidelines with assigned personnel  7. Initiate Psychosocial CNS and Spiritual Care consult, as indicated  10/21/2024 0458 by Timmie Pica, RN  Outcome: Progressing  10/20/2024 1828  by Rigoberto Gauze, RN  Outcome: Progressing     Problem: Pain  Goal: Verbalizes/displays adequate comfort level or baseline comfort level  10/21/2024 0458 by Timmie Pica, RN  Outcome: Progressing  10/20/2024 1828 by Rigoberto Gauze, RN  Outcome: Progressing     Problem: Safety - Medical Restraint  Goal: Remains free of injury from restraints (Restraint for Interference with Medical Device)  Description: INTERVENTIONS:  1. Determine that other, less restrictive measures have been tried or would not be effective before applying the restraint  2. Evaluate the patient's condition at the time of restraint application  3. Inform patient/family regarding the reason for restraint  4. Q2H: Monitor safety, psychosocial status, comfort, nutrition and hydration  10/21/2024 0458 by Timmie Pica, RN  Outcome: Progressing  10/20/2024 1828 by Rigoberto Gauze, RN  Outcome: Progressing     Problem: ABCDS Injury Assessment  Goal: Absence of physical injury  10/21/2024 0458 by Timmie Pica, RN  Outcome: Progressing  10/20/2024 1828 by Rigoberto Gauze, RN  Outcome: Progressing     "

## 2024-10-21 NOTE — Plan of Care (Signed)
"    Problem: SLP Adult - Impaired Swallowing  Goal: By Discharge: Advance to least restrictive diet without signs or symptoms of aspiration for planned discharge setting.  See evaluation for individualized goals.  Description: Goals initiated on 10/20/24  1. Patient will participate in imaging of swallow with appropriate goals to follow within 7 days. MET 10/20/24  2. Patient will tolerate least restrictive diet with no overt s/s aspiration within 7 days.      10/21/2024 1540 by Secundino Wonda RAMAN, SLP  Outcome: Not Progressing     Problem: SLP Adult - Impaired Swallowing  Goal: By Discharge: Advance to least restrictive diet without signs or symptoms of aspiration for planned discharge setting.  See evaluation for individualized goals.  Description: Goals initiated on 10/20/24  1. Patient will participate in imaging of swallow with appropriate goals to follow within 7 days. MET 10/20/24  2. Patient will tolerate least restrictive diet with no overt s/s aspiration within 7 days.      10/21/2024 1540 by Secundino Wonda RAMAN, SLP  Outcome: Not Progressing     "

## 2024-10-21 NOTE — Plan of Care (Signed)
"    Problem: Safety - Adult  Goal: Free from fall injury  10/21/2024 1505 by Rosalba Jansky, RN  Outcome: Progressing  Flowsheets (Taken 10/21/2024 1505)  Free From Fall Injury: Instruct family/caregiver on patient safety  10/21/2024 0458 by Timmie Pica, RN  Outcome: Progressing     Problem: Skin/Tissue Integrity  Goal: Skin integrity remains intact  Description: 1.  Monitor for areas of redness and/or skin breakdown  2.  Assess vascular access sites hourly  3.  Every 4-6 hours minimum:  Change oxygen saturation probe site  4.  Every 4-6 hours:  If on nasal continuous positive airway pressure, respiratory therapy assess nares and determine need for appliance change or resting period  10/21/2024 1505 by Rosalba Jansky, RN  Outcome: Progressing  Flowsheets (Taken 10/21/2024 0845)  Skin Integrity Remains Intact: Monitor for areas of redness and/or skin breakdown  10/21/2024 0458 by Timmie Pica, RN  Outcome: Progressing     "

## 2024-10-21 NOTE — Progress Notes (Signed)
 "[facility]  [address] [place], [place] [address]  [phone]        Hospitalist Progress Note      NAME: Chelsea Schultz   DOB:  03/05/1926  MRM:  [id number]    Date/Time of service: 10/21/2024  3:43 PM       Subjective:     Chief Complaint: Pt seen and examined. Family at bedside. See ACP note. After MBS diet started and per RN report no OVERT aspiration events however pt with uptrending WBC count and x-ray findings suggestive of aspiration progression        Objective:       Vitals:       Last 24hrs VS reviewed since prior progress note. Most recent are:    Vitals:    10/21/24 0848   BP:    Pulse: 75   Resp:    Temp:    SpO2:      SpO2 Readings from Last 6 Encounters:   10/21/24 96%   08/19/24 96%   06/26/23 97%   09/05/22 96%   06/19/22 96%   05/11/22 94%          Intake/Output Summary (Last 24 hours) at 10/21/2024 1543  Last data filed at 10/21/2024 0903  Gross per 24 hour   Intake 610 ml   Output --   Net 610 ml        Exam:     Physical Exam:    Gen: frail elderly female. Somnolent. Arousable   HEENT:  Pink conjunctivae, PERRL, hearing intact to voice, dry mucous membranes  Neck:  Supple, without masses  Resp: R sided coarse breath sounds. Poor inspiratory effort   Card:  No murmurs, normal S1, S2 without thrills, bruits or peripheral edema  Abd:  Soft, non-tender, non-distended, normoactive bowel sounds are present  Musc:  No cyanosis or clubbing  Skin:  No rashes  Neuro: moving UE. Does not want to move/wiggle toes. Heel offloading boots in place   Psych:  no insight    Medications Reviewed: (see below)    Lab Data Reviewed: (see below)    ______________________________________________________________________    Medications:     Current Facility-Administered Medications   Medication Dose Route Frequency    methylPREDNISolone  sodium succ (SOLU-MEDROL ) 40 mg in sterile water  1 mL injection  40 mg IntraVENous Daily    insulin  lispro (HUMALOG ,ADMELOG ) injection vial 0-8  Units  0-8 Units SubCUTAneous Q6H    lactated ringers  infusion   IntraVENous Continuous    piperacillin -tazobactam (ZOSYN ) 3,375 mg in sodium chloride  0.9 % 50 mL IVPB (addEASE)  3,375 mg IntraVENous Q8H    hydrALAZINE  (APRESOLINE ) injection 10 mg  10 mg IntraVENous Q6H PRN    QUEtiapine  (SEROQUEL ) tablet 12.5 mg  12.5 mg Oral TID PRN    diazePAM  (VALIUM ) injection 2.5 mg  2.5 mg IntraVENous Q8H PRN    acetaminophen  (TYLENOL ) tablet 650 mg  650 mg Oral Q4H PRN    guaiFENesin  (ROBITUSSIN) 100 MG/5ML liquid 200 mg  200 mg Oral Q4H PRN    glucose chewable tablet 16 g  4 tablet Oral PRN    dextrose  bolus 10% 125 mL  125 mL IntraVENous PRN    Or    dextrose  bolus 10% 250 mL  250 mL IntraVENous PRN    glucagon  injection 1 mg  1 mg SubCUTAneous PRN    dextrose  10 % infusion   IntraVENous Continuous PRN    ipratropium 0.5  mg-albuterol  2.5 mg (DUONEB ) nebulizer solution 1 Dose  1 Dose Inhalation Q4H PRN    prochlorperazine  (COMPAZINE ) injection 10 mg  10 mg IntraVENous Q6H PRN    sodium chloride  flush 0.9 % injection 5-40 mL  5-40 mL IntraVENous PRN    0.9 % sodium chloride  infusion   IntraVENous PRN    enoxaparin  Sodium (LOVENOX ) injection 30 mg  30 mg SubCUTAneous Daily    acetaminophen  (TYLENOL ) suppository 650 mg  650 mg Rectal Q6H PRN    bisacodyl  (DULCOLAX) EC tablet 5 mg  5 mg Oral Daily PRN    albuterol  (PROVENTIL ) (2.5 MG/3ML) 0.083% nebulizer solution 2.5 mg  2.5 mg Nebulization Q4H PRN    benzonatate  (TESSALON ) capsule 100 mg  100 mg Oral TID PRN          Lab Review:     Recent Labs     10/19/24  0614 10/20/24  0110 10/21/24  0022   WBC 16.6* 17.4* 20.8*   HGB 10.4* 10.0* 10.5*   HCT 32.4* 30.9* 33.5*   PLT 512* 540* 511*     Recent Labs     10/19/24  0614 10/20/24  0110 10/20/24  0220 10/20/24  0536 10/21/24  0022   NA 139   < > 135* 137 133*   K 4.1   < > 5.3* 3.5 3.3*   CL 103   < > 99 99 97*   CO2 32*   < > 27 27 31*   BUN 21   < > 24* 24* 25*   MG 1.9  --   --   --  1.8   PHOS 2.2*  --   --   --  1.5*   ALT   --   --  8* 6*  --     < > = values in this interval not displayed.     No results found for: GLUCPOC       Assessment / Plan:   89 yo Hx of HTN, DM, interstitial lung dz, dementia, presented w/ AMS, resp failure, hypoxia, pulm fibrosis, enterococcus UTI    1) Acute resp failure/hypoxia: 2/2 pulmonary fibrosis, aspiration PNA/pneumonitis   -will resume IV steroids as cannot continue PO taper; can taper IV   -s/p IV antibiotics; transitioned to PO now back on IV due to concerns for aspiration   -appreciate speech; MBS completed; diet started; now with recurrent e/o aspiration   -continue goals of care discussions; at 89yo pt is frail with pulmonary fibrosis, recurrent aspiration events and dementia; very appropriate for hospice; palliative care on board     2) Pneumonia: due to location on imaging, suspect aspiration   -resume zosyn  as unable to tolerate PO     3) Acute met encephalopathy: due to hypoxia, UTI, with underlying dementia.  Head CT without acute process  -monitor     4) Pleuritic chest pain: possibly 2/2 PNA/pneumonitis. CTA without e/o PE. Resolved. Monitor     5) UTI: culture with Enterococcus   -on zosyn  as per above     6) Hypercalcemia: likely from dehydration  -PTH WNL   -vitamin D  low; replete if able to tolerate PO   -PTHrP pending     7) DM type 2: A1C 7.0%  -ISS   -BG checks AC q6h while NPO  -hold lantus  as NPO again    8) HTN:   -holding PO BP meds  -may need scheduled IV metoprolol  again     9) Abnormal thyroid  function: free T4 WNL. TSH low. Subclinical hyperthyroidism. Repeat TFT's in 6 weeks.     10) Dementia:   -continue goals of care discussions     11) Other:   PT IS FRAIL WITH A BMI OF 19. LIKELY OSTEOPOROTIC AND REMAINS FULL CODE. CPR ON THIS PATIENT WILL LIKELY LEAD TO TRAUMA, PAIN AND DISCOMFORT. HAVE RECOMMENDED AGAINST CPR AT THIS TIME. FOR NOW FAMILY HAS NOT CHANGED CODE STATUS TO DNR    FULL CODE     **Prior records, notes, labs, radiology, and medications reviewed in [facility]**    Total time spent with patient care: 60 Minutes                  **I personally saw and examined the patient during this time period**                 Care Plan discussed with: Patient, nursing, family     Discussed:  Care Plan    Prophylaxis:  Lovenox     Disposition: TBD           ___________________________________________________    Attending Physician: Consepcion Coombs, MD     "

## 2024-10-21 NOTE — Care Coordination (Signed)
"  Care Management Progress Note        Reason for Admission:   Hypoxemia [R09.02]  Severe sepsis (HCC) [A41.9, R65.20]  Community acquired pneumonia, unspecified laterality [J18.9]         Patient Admission Status: Inpatient  RUR: 17%  Hospitalization in the last 30 days (Readmission):  No          Transition of care plan:  Patient was discussed in IDR and continues to be medically managed.    Dispo: TBD. Both [facility] and [facility] have declined patient for IPR due to medical appropriateness. [facility] continues to accept.   If SNF or IPR:  Date FOC offered:   Accepting facility:   Date authorization started with reference number:  Patient will not need insurance authorization and has met her 3 midnights under medicare for SNF.   Date authorization received and expires:     Discharge plan communicated with patient and/or discharge caregiver: Yes . CM discussed IPR denial and final SNF preference with 2 daughters at bedside today 12/30. Daughters to discuss with additional family members and update CM.     Date 1st IMM letter given: 10/15/24    Outpatient follow-up.    Transport at discharge: stretcher transport.         ___________________________________________   [name], RN Case Manager  10/21/2024   11:34 AM       "

## 2024-10-21 NOTE — Progress Notes (Signed)
"  Physical/Occupational Therapy    944 Pt received in bed two family members at bedside. Pt minimally responsive to verbal and tactile stimulus. O2 sat 92% on 2L. PT will defer at this time may attempt later today when patient is more alert and able to actively participate with therapy   "

## 2024-10-21 NOTE — Progress Notes (Signed)
 "Palliative Medicine  Patient Name: Chelsea Schultz  Date of Birth: 1926-08-18  MRN: [id number]  Age: 89 y.o.  Gender: female    Date of Initial Consult: 10/20/24  Date of Service: 10/21/2024  Time: 3:38 PM  Provider: Franchot Dickens, APRN - [facility] Day: 12  Admit Date: 10/10/2024  Referring Provider:  Dr. Rosalea       Reasons for Consultation:  Other: goals of care, code status    HISTORY OF PRESENT ILLNESS (HPI):   Chelsea Schultz is a 89 y.o. female with a past medical history of interstitial lung disease, prior CVA (2013, 2016), DM, HTN, recurrent UTIs who was admitted on 10/10/2024 from home with productive cough, fatigue for ~1 week. Upon arrival to ED, noted to be febrile and hypoxic requiring supplemental oxygen. Initial chest imaging obtained in ED revealing diffuse interstitial prominence of ground glass opacities suggestive of pulmonary fibrosis vs vs pneumonitis vs pneumonia. Was started on empiric IV antibiotics and admitted for further workup, medical management. Course has been significant for UTI, hospital acquired delirium with waxing/waning mentation. Review indicates fluctuating agitation causing her to self remove supplemental oxygen causing hypoxia, which has prompted several RRTs. Also with increasing lethargy, overall decline, concerns of aspiration with progressive dysphagia. Plan for MBS today. Palliative consulted to assist with goals.       Psychosocial: Six children - 5 living children to include four daughters and one son. Lives with daughter [name], pt has resided with Cathy since 2011. Prior, lived with daughter [name] in [place]. [name] is primary caregiver, does work full time hours at the [employer]. Additionally family has put in place private caregiver M-F 9-5, and 3 evenings a week from 5-9.     Recent baseline prior to admission: obtained from daughters   -Cognitively, able to recognize family members, able to carry on discussion/conversation with daughters and caregivers. Frequently has  disorientation in the evenings where she can become tearful/emotional but often redirectable.   -Functionally, able to stand/pivot and participate in some of her care albeit essentially reliant on all ADLs including bathing, toileting, dressing. Requires meal preparation.     Pt seen today at bedside prior to family meeting with four daughters. Currently resting in bed, on 3L NC. No distress, is confused but redirectable without overt agitation.    -12/30: Seen mid afternoon, dtr Kim at bedside but no other family present at time of visit. [name] was present earlier, [name] coming in around ~5:00-5:30 PM today. Unfortunately with significant decline since this morning. She is much less responsive, does not open eyes to voice. Suspected aspiration event thus reverted back to NPO.     PALLIATIVE DIAGNOSES:    Acute hypoxic respiratory failure; multifactorial  Delirium superimposed on baseline dementia  Dysphagia  High risk aspiration   Physical debility, generalized weakness   Goals of care  Advance care planning   Code status discussion     ASSESSMENT AND PLAN:   Chart reviewed prior to seeing pt; including notes, labs, imaging.   Interval events reviewed with Dr. Rondi  Care goals --   See initial consult after family meeting 12/29 for initial goals discussions   Dtr Luke at bedside today - review her decline since this morning which is unfortunately not a step in the direction they were hoping for.   Discussions held earlier with Dr. Rondi - Luke feels as if she and [name] are up to date on where she stands clinically. Shares the family as a  whole is trying to digest her decline.   We reflect on our initial conversation yesterday, comparing possible pathways to explore and possible outcomes. Shared my worry today that despite current measures in place, not making gains. Luke expresses her concern today, tells me she agrees pt may not be successful in making cognitive/functional gains, back to even a new  baseline.   Recognizes a significant change and hoping to get her other sister's thoughts once they are able to come to bedside and see changes.   Will plan to regroup tomorrow with family once all daughters are able to see her, process the situation. We did discuss on initial meeting how tenuous she is from a respiratory standpoint and how aggressive/invasive measures such as intubation/ventilation would not give the outcome of signs of recovery they are hoping for.    Goals/summary: continue current measures, more family coming to bedside this evening, ongoing discussions. Will follow up tomorrow.   Code status --   See initial palliative discussions 12/29. Readdressed by Dr. Yvonna today with daughters, recommending protecting her from resuscitation measures that given current condition, advanced age, and frailty would be very burdensome rather than beneficial.   Luke shares family is discussing this together   FULL code, will readdress when more family present tomorrow   ACP --   Pt adult children as LNOK - all work together in decision making   Symptoms --   Delirium:   Given baseline dementia, hospital environment, prolonged admission, acute infections  Recommend prioritizing non pharmacological measures:   Frequent reorientation and redirection   Awake and OOB during daytime hours, encourage mobility to chair for meals   Eye glasses/hearing aides on if applicable   Prioritize good sleep hygiene with clustering care, minimal sleep interruptions, labs obtained in late morning hours rather than middle of night   Would use IV Valium  2.5 mg every 8 hours prn as last line given risk of benzos worsening delirium    Initial consult note routed to primary continuity provider and/or primary health care team members  Discussed management with Dr. Rondi   Please call with any palliative questions or concerns.  Palliative Care Team is available via perfect serve or via phone.    Referrals to:   []  Outpatient  Palliative Care  []  Home Based Palliative Care  []  Home Based Primary Care  []  Hospice       ADVANCE CARE PLANNING:   []  The Pall Med Interdisciplinary Team has updated the ACP Navigator with Health Care Decision Maker and Patient Capacity      Primary Decision Maker: Deshay, Kirstein - Child [phone]    Primary Decision Maker: Manya Notice - Child [phone]  Confirm Advance Directive: None  Patient Would Like to Complete Advance Directive: Unable    Current Code Status: Full Code     Goals of Care: Goals of Care and Interventions  Patient/Health Care Proxy Stated Goals: Recovery from acute illness  Medical Interventions: Full interventions  Life Goals  Patient and Family Personal Goals: continue current measures, hopeful for recovery    Please refer to Palliative Medicine ACP notes for further details.    PALLIATIVE ASSESSMENT:      Palliative Performance Scale (PPS):  PPS: 30    ECOG:        Modified ESAS:  Modified-[place] Symptom Assessment Scale (ESAS)  Pain Score: No pain  Dyspnea Score: No shortness of breath    Clinical Pain Assessment (nonverbal scale for severity on nonverbal patients):  Clinical Pain Assessment  Severity: 0  Location: UTA  Character: UTA  Duration: UTA  Factors: UTA  Frequency: UTA       NVPS:  Adult Nonverbal Pain Scale (NVPS)  Face: No particular expression or smile  Activity (Movement): Laid quietly, normal position  Guarding: Lying quietly, no positioning of hands over areas of bod  Physiology (Vital Signs): Stable vital signs  Respiratory: Baseline RR/SpO2 compliant with ventilator  NVPS Score : 0    RDOS:  RDOS  Heart rate per minute: less than 90  Respiratory Rate per Minute: less than 19  Restlessness:non-purposeful: None  Paradoxical breathing pattern:abdomen moves in on inspiration: None  Accessory muscle use: rise in clavicle during inspiration: None  Grunting at end-expiration: guttural sound: None  Nasal flaring: involuntary movement of nares: None  Total  : 0      Vital Signs: Blood pressure 118/61, pulse 75, temperature 97.3 F (36.3 C), temperature source Oral, resp. rate 18, height 1.499 m (4' 11.02), weight 44.9 kg (98 lb 15.8 oz), SpO2 96%.    PHYSICAL ASSESSMENT:   General: []  Oriented x3  []  Well appearing  []  Intubated  [x] Ill appearing  [] Other: minimally responsive   Mental Status: []  Normal mental status exam  []  Drowsy  []  Confused  [] Other:  Cardiovascular: []  Regular rate/rhythm  []  Arrhythmia  []  Other: minimally responsive   Chest: [x]  Effort normal  [] Lungs clear  []  Respiratory distress  [] Tachypnea  []  Other: 3L NC  Abdomen: []  Soft/non-tender  []  Normal appearance  []  Distended  []  Ascites  []  Other:  Neurological: []  Normal speech  []  Normal sensation  [x] Deficits present: minimally responsive, unable to follow commands, does not open eyes to voice   Extremity: []  Normal skin color/temp  []  Clubbing/cyanosis  []  No edema  [x]  Other: debilitated appearing, temporal muscle wasting noted     Wt Readings from Last 15 Encounters:   10/17/24 44.9 kg (98 lb 15.8 oz)   06/26/23 40.8 kg (90 lb)   06/19/22 49.9 kg (110 lb)   02/24/22 48.8 kg (107 lb 9.6 oz)   12/29/21 43.5 kg (96 lb)        Current Diet: Diet NPO       PSYCHOSOCIAL/SPIRITUAL SCREENING:   Palliative IDT has assessed this patient for cultural preferences / practices and a referral made as appropriate to needs Gaffer, Patient Advocacy, Ethics, etc.)    Spiritual Affiliation: Non-Denominational    Any spiritual / religious concerns:  []  Yes /  [x]  No   If Yes to discuss with pastoral care during IDT     Does caregiver feel burdened by caring for their loved one:   []  Yes /  [x]  No /  []  No Caregiver Present/Available []  No Caregiver []  Pt Lives at Facility  If Yes to discuss with social work during IDT    Anticipatory grief assessment:   [x]  Normal  / []  Maladaptive     If Maladaptive to discuss with social work during IDT    ESAS Anxiety:      ESAS Depression:          LAB  AND IMAGING FINDINGS:   Objective data reviewed:  labs, images, records, medication use, vitals, and chart     FINAL COMMENTS   Thank you for allowing Palliative Medicine to participate in the care of Chelsea Schultz.    Only check if applicable and billing time based rather than MDM  []  The total  encounter time on this service date was __35__ minutes which was spent performing a face-to-face encounter and personally completing the provider-level activities documented in the note. This includes time spent prior to the visit and after the visit in direct care of the patient. This time does not include time spent in any separately reportable services.    Electronically signed by   Franchot Dickens, APRN - CNP  Palliative Care Team  on 10/21/2024 at 3:38 PM      "

## 2024-10-22 LAB — BASIC METABOLIC PANEL
Anion Gap: 5 mmol/L (ref 2–14)
BUN/Creatinine Ratio: 44 — ABNORMAL HIGH (ref 12–20)
BUN: 28 mg/dL — ABNORMAL HIGH (ref 8–23)
CO2: 30 mmol/L — ABNORMAL HIGH (ref 20–29)
Calcium: 9.6 mg/dL (ref 8.2–9.6)
Chloride: 100 mmol/L (ref 98–107)
Creatinine: 0.62 mg/dL (ref 0.60–1.00)
Est, Glom Filt Rate: 81 ml/min/1.73m2 (ref >59)
Glucose: 180 mg/dL — ABNORMAL HIGH (ref 65–100)
Potassium: 3.6 mmol/L (ref 3.5–5.1)
Sodium: 136 mmol/L (ref 136–145)

## 2024-10-22 LAB — MAGNESIUM: Magnesium: 2.4 mg/dL — ABNORMAL HIGH (ref 1.7–2.3)

## 2024-10-22 LAB — CBC WITH AUTO DIFFERENTIAL
Basophils %: 0.1 % (ref 0.0–1.0)
Basophils Absolute: 0.01 K/UL (ref 0.00–0.10)
Eosinophils %: 0.1 % (ref 0.0–7.0)
Eosinophils Absolute: 0.01 K/UL (ref 0.00–0.40)
Hematocrit: 33 % — ABNORMAL LOW (ref 35.0–47.0)
Hemoglobin: 10 g/dL — ABNORMAL LOW (ref 11.5–16.0)
Immature Granulocytes %: 0.8 % — ABNORMAL HIGH (ref 0.0–0.5)
Immature Granulocytes Absolute: 0.09 K/UL — ABNORMAL HIGH (ref 0.00–0.04)
Lymphocytes %: 8.5 % — ABNORMAL LOW (ref 12.0–49.0)
Lymphocytes Absolute: 0.93 K/UL (ref 0.80–3.50)
MCH: 26.2 pg (ref 26.0–34.0)
MCHC: 30.3 g/dL (ref 30.0–36.5)
MCV: 86.4 FL (ref 80.0–99.0)
MPV: 9.7 FL (ref 8.9–12.9)
Monocytes %: 6.9 % (ref 5.0–13.0)
Monocytes Absolute: 0.75 K/UL (ref 0.00–1.00)
Neutrophils %: 83.6 % — ABNORMAL HIGH (ref 32.0–75.0)
Neutrophils Absolute: 9.11 K/UL — ABNORMAL HIGH (ref 1.80–8.00)
Nucleated RBCs: 0 /100{WBCs}
Platelets: 405 K/uL — ABNORMAL HIGH (ref 150–400)
RBC: 3.82 M/uL (ref 3.80–5.20)
RDW: 12.7 % (ref 11.5–14.5)
WBC: 10.9 K/uL (ref 3.6–11.0)
nRBC: 0 K/uL (ref 0.00–0.01)

## 2024-10-22 LAB — URINALYSIS WITH REFLEX TO CULTURE
BACTERIA, URINE: NEGATIVE /HPF
Bilirubin, Urine: NEGATIVE
Blood, Urine: NEGATIVE
Glucose, Ur: >1000 mg/dL — AB
Leukocyte Esterase, Urine: NEGATIVE
Nitrite, Urine: NEGATIVE
Protein, UA: 30 mg/dL — AB
Specific Gravity, UA: >1.03 — ABNORMAL HIGH (ref 1.003–1.030)
Urobilinogen, Urine: 1 EU/dL (ref 0.2–1.0)
pH, Urine: 5 (ref 5.0–8.0)

## 2024-10-22 LAB — POCT GLUCOSE
POC Glucose: 184 mg/dL — ABNORMAL HIGH (ref 65–117)
POC Glucose: 172 mg/dL — ABNORMAL HIGH (ref 65–117)
POC Glucose: 184 mg/dL — ABNORMAL HIGH (ref 65–117)

## 2024-10-22 LAB — PTH-RELATED PEPTIDE: PTH Related Peptide: <2 pmol/L

## 2024-10-22 LAB — PROCALCITONIN: Procalcitonin: 0.05 ng/mL

## 2024-10-22 MED ORDER — HYDROMORPHONE HCL PF 1 MG/ML IJ SOLN
1 | INTRAMUSCULAR | Status: DC | PRN
Start: 2024-10-22 — End: 2024-10-24

## 2024-10-22 MED ORDER — MORPHINE SULFATE (PF) 4 MG/ML IJ SOLN
4 | INTRAMUSCULAR | Status: DC | PRN
Start: 2024-10-22 — End: 2024-10-22

## 2024-10-22 MED ORDER — DIAZEPAM 5 MG/ML IJ SOLN
5 | INTRAMUSCULAR | Status: DC | PRN
Start: 2024-10-22 — End: 2024-10-24

## 2024-10-22 MED ORDER — ACETAMINOPHEN 650 MG RE SUPP
650 | Freq: Four times a day (QID) | RECTAL | Status: DC
Start: 2024-10-22 — End: 2024-10-27
  Administered 2024-10-23 – 2024-10-27 (×7): 650 mg via RECTAL

## 2024-10-22 MED ORDER — LORAZEPAM 2 MG/ML PO CONC
2 | ORAL | Status: DC | PRN
Start: 2024-10-22 — End: 2024-10-24

## 2024-10-22 MED FILL — ENOXAPARIN SODIUM 30 MG/0.3ML IJ SOSY: 30 MG/0.3ML | INTRAMUSCULAR | Qty: 0.3 | Fill #0

## 2024-10-22 MED FILL — PIPERACILLIN SOD-TAZOBACTAM SO 3.375 (3-0.375) G IV SOLR: 3.375 (3-0.375) g | INTRAVENOUS | Qty: 3375 | Fill #0

## 2024-10-22 MED FILL — ACETAMINOPHEN 650 MG RE SUPP: 650 mg | RECTAL | Qty: 1 | Fill #0

## 2024-10-22 MED FILL — INSULIN LISPRO 100 UNIT/ML IJ SOLN: 100 [IU]/mL | INTRAMUSCULAR | Qty: 2 | Fill #0

## 2024-10-22 MED FILL — METHYLPREDNISOLONE SODIUM SUCC 40 MG IJ SOLR: 40 mg | INTRAMUSCULAR | Qty: 40 | Fill #0

## 2024-10-22 MED FILL — LORAZEPAM 2 MG/ML PO CONC: 2 mg/mL | ORAL | Qty: 0.5 | Fill #0

## 2024-10-22 NOTE — Progress Notes (Signed)
"  Occupational/Physical Therapy Note     Chart reviewed and spoke to family. Patient with plans to transition to comfort measures. OT/PT will complete orders.    Thank you,  Barabara Fireman, OTR/L  "

## 2024-10-22 NOTE — Progress Notes (Addendum)
 "Palliative Medicine  Patient Name: Chelsea Schultz  Date of Birth: 02-23-1926  MRN: [id number]  Age: 89 y.o.  Gender: female    Date of Initial Consult: 10/20/24  Date of Service: 10/22/2024  Time: 2:41 PM  Provider: Franchot Dickens, APRN - [facility] Day: 13  Admit Date: 10/10/2024  Referring Provider:  Dr. Rosalea       Reasons for Consultation:  Other: goals of care, code status    HISTORY OF PRESENT ILLNESS (HPI):   Chelsea Schultz is a 89 y.o. female with a past medical history of interstitial lung disease, prior CVA (2013, 2016), DM, HTN, recurrent UTIs who was admitted on 10/10/2024 from home with productive cough, fatigue for ~1 week. Upon arrival to ED, noted to be febrile and hypoxic requiring supplemental oxygen. Initial chest imaging obtained in ED revealing diffuse interstitial prominence of ground glass opacities suggestive of pulmonary fibrosis vs vs pneumonitis vs pneumonia. Was started on empiric IV antibiotics and admitted for further workup, medical management. Course has been significant for UTI, hospital acquired delirium with waxing/waning mentation. Review indicates fluctuating agitation causing her to self remove supplemental oxygen causing hypoxia, which has prompted several RRTs. Also with increasing lethargy, overall decline, concerns of aspiration with progressive dysphagia. Plan for MBS today. Palliative consulted to assist with goals.       Psychosocial: Six children - 5 living children to include four daughters and one son. Lives with daughter Chelsea Schultz, pt has resided with Cathy since 2011. Prior, lived with daughter Chelsea Schultz in [place]. Chelsea Schultz is primary caregiver, does work full time hours at the [employer]. Additionally family has put in place private caregiver M-F 9-5, and 3 evenings a week from 5-9.     Recent baseline prior to admission: obtained from daughters   -Cognitively, able to recognize family members, able to carry on discussion/conversation with daughters and caregivers. Frequently has  disorientation in the evenings where she can become tearful/emotional but often redirectable.   -Functionally, able to stand/pivot and participate in some of her care albeit essentially reliant on all ADLs including bathing, toileting, dressing. Requires meal preparation.     Pt seen today at bedside prior to family meeting with four daughters. Currently resting in bed, on 3L NC. No distress, is confused but redirectable without overt agitation.    -12/30: Seen mid afternoon, dtr Kim at bedside but no other family present at time of visit. Chelsea Schultz was present earlier, Chelsea Schultz coming in around ~5:00-5:30 PM today. Unfortunately with significant decline since this morning. She is much less responsive, does not open eyes to voice. Suspected aspiration event thus reverted back to NPO.   -12/31: Seen midmorning, dtr Chelsea Schultz and dtr Chelsea Schultz at bedside. No significant changes from yesterday as pt remains minimally responsive, family reports she had finally settled after generalized discomfort this morning so did not wake per request. Remains on 2L NC but no distress. No prn medication requirements.     PALLIATIVE DIAGNOSES:    Acute hypoxic respiratory failure; multifactorial  Delirium superimposed on baseline dementia  Dysphagia  High risk aspiration   Physical debility, generalized weakness   Goals of care  Advance care planning   Code status discussion     ASSESSMENT AND PLAN:   Chart reviewed prior to seeing pt; including notes, labs, imaging.   Interval events reviewed with Dr. Rondi  Care goals --   See prior discussions 12/29, 12/30   Circled back to check in and provide support to pt, daughters  present this morning -- Chelsea Schultz and Chelsea Schultz  Followed up on conversations yesterday from both our team and hospitalist team. Given current state, unsure about the possibility of her making gains from here. They continue to process and appropriately tearful, feel ready once more family present this afternoon to sit down and regroup  on plans, next steps from here.   Chelsea Schultz states she has made her peace with her mom's situation, Chelsea Schultz sharing with me today she values dignity and hoping for pt to have comfort in this time.   Once again - plan to regroup and discuss next steps given pt decline once more daughters present this afternoon.   Addendum: ~1:00 PM  Met with multiple family members at bedside - to include daughters Chelsea Schultz Chelsea Schultz Chelsea Schultz Chelsea Schultz Chelsea Schultz, granddaughter Chelsea Schultz, and son Chelsea Schultz joins via [employer].   We review hospitalization, current conditions, review her decline as a whole evidenced by worsening mentation/exam, worsening imaging with concern for recurrent aspiration with signs of progressive pneumonia. All family understanding and express insight that we are at a point where pt is not going to show signs of recovery back to baseline - fear there may not be a pathway out of her current state.   Inquire family discussions/decisions thus far - they have all agreed on protecting her from resuscitative measures to include resuscitation in event of cardiac arrest.   Also broach protecting her from intubation/ventilation - which after discussion of her very frail respiratory state on top of worsening acute issues, would not do well and very likely suspect she would not be able to be extubated successfully. All agree to protect her from intubation in event of arrest or respiratory decline.   We also discuss nutrition barriers at length - currently receiving IV fluids given poor oral intake as well as artificial nutrition through NG/PEG had been discussed earlier by RD as well as hospitalist. She is not a candidate for PEG given her current state. Family understanding of this as we walk through the many risks/burden it would place on her rather than provide benefit.   We then review next steps - discuss comfort measures, hospice, in depth. Share recommendation of starting comfort measures in the hospital, monitor her and her needs over  next day or two, then discuss bringing in hospice and where this may take place -- whether inpatient if she were to meet GIP level of care vs exploring hospice in a facility. Chelsea Schultz helped take care of their father in home hospice and felt a significant challenge of playing the role of caregiver. Ideally hoping for placement if she ultimately does not meet GIP level of care.   Review again comfort measures at length which entails stopping IV fluids, IV abx, medications not contributing to her quality of life and shifting gears to focus on treating her and her comfort. Could liberate comfort feeds if she were to be more alert to tolerate, however do not anticipate a significant change here. also liberate comfort medications for any symptoms of pain, dyspnea, anxiety, etc.   Family struggling a bit with nutrition barriers and deescalating IV fluids and IV antibiotics - some varying opinions amongst siblings initially. Provide support in this challenge, and do share that we do not anticipate this causing a very rapid decline when stopped. A possibility she could pass even with these measures in place.   Explain signs we look for in volume overload and excess fluid as the body transitions towards end of life. They are hoping  for close assessment of this.   At conclusion of conversation - family do not want her to suffer, want to ensure she is comfortable. They do not want to prolong her life as they certainly grasp her current condition. Explain comfort medications to have in our back pocket to have on hand with current plan in place as well as once they opt to fully transition to comfort measures.   Goals/summary: DNR/DNI. NO escalation of care, stop labs/imaging/workup, but continue current medications in place for now. Plan to allow family to come to bedside, say goodbyes. Transition to comfort measures Friday 1/2. Should she show signs of decline sooner, family receptive to stopping above measures and officially  moving forward with comfort care. Discussed and updated above plan with Dr. Iris, RN Chelsea Schultz, case management Chelsea Schultz.  Code status --   Full code, will regroup once more family are here for planned meeting this afternoon  ACP --   Pt adult children as LNOK - all work together in decision making   Symptoms -- will have both oral/sublingual and IV options as needed  Delirium/anxiety/restlessness:   Given baseline dementia, hospital environment, prolonged admission, acute infections  Recommend prioritizing non pharmacological measures:   Frequent reorientation and redirection   Awake and OOB during daytime hours, encourage mobility to chair for meals   Eye glasses/hearing aides on if applicable   Prioritize good sleep hygiene with clustering care, minimal sleep interruptions, labs obtained in late morning hours rather than middle of night   Ativan  0.5 mg oral solution every 4 hours prn   Valium  2.5 mg IV every 4 hours prn   Nonverbal signs of pain:   APAP suppository 650 mg every six hours scheduled per family request - as long as pt able to tolerate turning for PR administration   Oxycodone  2.5 mg oral solution every 4 hours prn   Dilaudid  0.25 mg IV every 4 hours prn (stop morphine  given current renal function -- CrCl 38)  Initial consult note routed to primary continuity provider and/or primary health care team members  Please call with any palliative questions or concerns.  Palliative Care Team is available via perfect serve or via phone.    Referrals to:   []  Outpatient Palliative Care  []  Home Based Palliative Care  []  Home Based Primary Care  []  Hospice       ADVANCE CARE PLANNING:   []  The Pall Med Interdisciplinary Team has updated the ACP Navigator with Health Care Decision Maker and Patient Capacity      Primary Decision Maker: Chelsea Schultz, Chelsea Schultz - Child [phone]    Primary Decision Maker: Chelsea Schultz - Child [phone]  Confirm Advance Directive: None  Patient Would Like to Complete  Advance Directive: Unable    Current Code Status: DNR     Goals of Care: Goals of Care and Interventions  Patient/Health Care Proxy Stated Goals: Recovery from acute illness  Medical Interventions: Full interventions  Life Goals  Patient and Family Personal Goals: continue current measures, hopeful for recovery    Please refer to Palliative Medicine ACP notes for further details.    PALLIATIVE ASSESSMENT:      Palliative Performance Scale (PPS):  PPS: 30    ECOG:        Modified ESAS:  Modified-[place] Symptom Assessment Scale (ESAS)  Pain Score: No pain  Dyspnea Score: No shortness of breath    Clinical Pain Assessment (nonverbal scale for severity on nonverbal patients):   Clinical Pain Assessment  Severity: 0  Location: UTA  Character: UTA  Duration: UTA  Factors: UTA  Frequency: UTA       NVPS:  Adult Nonverbal Pain Scale (NVPS)  Face: Occasional grimace, tearing, frowning, wrinkled forehead  Activity (Movement): Seeks attention through movement, slow cautious movements  Guarding: Splinting areas of the body, tense  Physiology (Vital Signs): Stable vital signs  Respiratory: Baseline RR/SpO2 compliant with ventilator  NVPS Score : 3    RDOS:  RDOS  Heart rate per minute: less than 90  Respiratory Rate per Minute: less than 19  Restlessness:non-purposeful: None  Paradoxical breathing pattern:abdomen moves in on inspiration: None  Accessory muscle use: rise in clavicle during inspiration: None  Grunting at end-expiration: guttural sound: None  Nasal flaring: involuntary movement of nares: None  Total : 0      Vital Signs: Blood pressure (!) 149/62, pulse 67, temperature 97.9 F (36.6 C), temperature source Axillary, resp. rate 16, height 1.499 m (4' 11.02), weight 44.9 kg (98 lb 15.8 oz), SpO2 96%.    PHYSICAL ASSESSMENT:   General: []  Oriented x3  []  Well appearing  []  Intubated  [x] Ill appearing  [] Other: minimally responsive   Mental Status: []  Normal mental status exam  []  Drowsy  []  Confused   [] Other:  Cardiovascular: []  Regular rate/rhythm  []  Arrhythmia  [x]  Other: minimally responsive   Chest: [x]  Effort normal  [] Lungs clear  []  Respiratory distress  [] Tachypnea  []  Other: 2L NC  Abdomen: []  Soft/non-tender  []  Normal appearance  []  Distended  []  Ascites  []  Other:  Neurological: []  Normal speech  []  Normal sensation  [x] Deficits present: minimally responsive, unable to follow commands, does not open eyes to voice   Extremity: []  Normal skin color/temp  []  Clubbing/cyanosis  []  No edema  [x]  Other: debilitated appearing, temporal muscle wasting noted     Wt Readings from Last 15 Encounters:   10/17/24 44.9 kg (98 lb 15.8 oz)   06/26/23 40.8 kg (90 lb)   06/19/22 49.9 kg (110 lb)   02/24/22 48.8 kg (107 lb 9.6 oz)   12/29/21 43.5 kg (96 lb)        Current Diet: Diet NPO       PSYCHOSOCIAL/SPIRITUAL SCREENING:   Palliative IDT has assessed this patient for cultural preferences / practices and a referral made as appropriate to needs Gaffer, Patient Advocacy, Ethics, etc.)    Spiritual Affiliation: Non-Denominational    Any spiritual / religious concerns:  []  Yes /  [x]  No   If Yes to discuss with pastoral care during IDT     Does caregiver feel burdened by caring for their loved one:   []  Yes /  [x]  No /  []  No Caregiver Present/Available []  No Caregiver []  Pt Lives at Facility  If Yes to discuss with social work during IDT    Anticipatory grief assessment:   [x]  Normal  / []  Maladaptive     If Maladaptive to discuss with social work during IDT    ESAS Anxiety:      ESAS Depression:          LAB AND IMAGING FINDINGS:   Objective data reviewed:  labs, images, records, medication use, vitals, and chart     FINAL COMMENTS   Thank you for allowing Palliative Medicine to participate in the care of Chelsea Schultz.    Only check if applicable and billing time based rather than MDM  []  The total encounter time on this  service date was __50__ minutes which was spent performing a  face-to-face encounter and personally completing the provider-level activities documented in the note. This includes time spent prior to the visit and after the visit in direct care of the patient. This time does not include time spent in any separately reportable services.    Electronically signed by   Franchot Dickens, APRN - CNP  Palliative Care Team  on 10/22/2024 at 2:41 PM      "

## 2024-10-22 NOTE — Progress Notes (Signed)
 "    Hospitalist Progress Note      NAME:  Chelsea Schultz   DOB:  1925-11-24  MRM:  [id number]    Date/Time: 10/22/2024  4:41 PM           Assessment / Plan:     89 yo Hx of HTN, DM, interstitial lung dz, dementia, presented w/ AMS, resp failure, hypoxia, pulm fibrosis, enterococcus UTI     Present on Admission:   Severe sepsis (HCC)   Pulmonary fibrosis (HCC)   Acute hypoxemic respiratory failure (HCC)   Community acquired pneumonia   Delirium    1) Acute resp failure/hypoxia: 2/2 pulmonary fibrosis, aspiration PNA/pneumonitis   -Previously on p.o. antibiotics, switched back to IV due to concern for aspiration pneumonia.  -MBS completed.  -Patient at risk of severe aspiration, currently NPO.  Reassess daily.  -Maintain aspiration precautions.  -Supplemental oxygen as needed.  -Wean steroids.     2) Pneumonia: due to location on imaging, suspect aspiration   -resume zosyn  as unable to tolerate PO      3) Acute met encephalopathy: due to hypoxia, UTI, with underlying dementia.  Head CT without acute process  - Maintain fall precautions     4) Pleuritic chest pain: possibly 2/2 PNA/pneumonitis. CTA without e/o PE. Resolved. Monitor      5) UTI: culture with Enterococcus   -on zosyn  as per above      6) Hypercalcemia: likely from dehydration  -PTH WNL   -vitamin D  low; replete if able to tolerate PO   -PTHrP pending      7) DM type 2: A1C 7.0%  -ISS   -BG checks AC q6h while NPO  -hold lantus  as NPO again     8) HTN:   -holding PO BP meds  -may need scheduled IV metoprolol  again      9) Abnormal thyroid function: free T4 WNL. TSH low. Subclinical hyperthyroidism. Repeat TFT's in 6 weeks.      10) Dementia:   Delirium precautions using a combination of nonpharmacologic and pharmacologic strategies.  Nonpharmacologic interventions : Minimize nighttime interruptions, try to maintain normal sleep-wake cycle, optimize nutrition, frequently reorient.      Pharmacologic : Diazepam /Ativan  as needed.    Bedside sitter if  needed.  Maintain fall precautions.    11) ACP/goals of care  -Palliative on board.  Family now agreeable for DNR/DNI.  -Planning to transition to comfort care in the next 24 to 48 hours.  - No escalation of care if her condition continues to deteriorate.  - Appreciate palliative care assistance.      #BMI (Calculated): 19.98    I have personally reviewed the radiographs, laboratory data in Epic and decisions and statements above are based partially on this personal interpretation.                 Care Plan discussed with: Patient and Family    Discussed:  Code Status and Care Plan    Prophylaxis:  SCD's    Disposition:  Home w/Family           ___________________________________________________    Attending Physician: Debby JINNY Binder, MD        Subjective:     Chief Complaint: Denies any symptoms    ROS:    Patient was seen and examined at bedside.  Multiple family members present.  I personally updated the patient's daughter.  Patient is only minimally verbal.  She is unable to provide any meaningful history.  Unable to obtain ROS.       Objective:       Vitals:          Last 24hrs VS reviewed since prior progress note. Most recent are:    Vitals:    10/22/24 0741   BP: (!) 149/62   Pulse: 67   Resp: 16   Temp: 97.9 F (36.6 C)   SpO2: 96%     SpO2 Readings from Last 6 Encounters:   10/22/24 96%   08/19/24 96%   06/26/23 97%   09/05/22 96%   06/19/22 96%   05/11/22 94%        No intake or output data in the 24 hours ending 10/22/24 1641       Exam:     Physical Exam:    Gen: Chronically ill-appearing  HEENT:  Pink conjunctivae, PERRL, hearing intact to voice, moist mucous membranes  Neck:  Supple, without masses, thyroid non-tender  Resp:  No accessory muscle use, clear breath sounds without wheezes rales or rhonchi  Card:  No murmurs, normal S1, S2 without thrills, bruits or peripheral edema  Abd:  Soft, non-tender, non-distended, normoactive bowel sounds are present  Musc:  No cyanosis or clubbing  Skin:  No  rashes or ulcers, skin turgor is good  Neuro: No focal deficits, unable to assess sensations, reflexes 2+  Psych: Oriented only to self    Medications Reviewed: (see below)    Lab Data Reviewed: (see below)    ______________________________________________________________________    Medications:     Current Facility-Administered Medications   Medication Dose Route Frequency    diazePAM  (VALIUM ) injection 2.5 mg  2.5 mg IntraVENous Q4H PRN    LORazepam  (ATIVAN ) 2 MG/ML concentrated solution 0.5 mg  0.5 mg Oral Q4H PRN    HYDROmorphone  HCl PF (DILAUDID ) injection 0.25 mg  0.25 mg IntraVENous Q4H PRN    acetaminophen  (TYLENOL ) suppository 650 mg  650 mg Rectal Q6H    methylPREDNISolone  sodium succ (SOLU-MEDROL ) 40 mg in sterile water  1 mL injection  40 mg IntraVENous Daily    insulin  lispro (HUMALOG ,ADMELOG ) injection vial 0-8 Units  0-8 Units SubCUTAneous Q6H    lactated ringers  infusion   IntraVENous Continuous    piperacillin -tazobactam (ZOSYN ) 3,375 mg in sodium chloride  0.9 % 50 mL IVPB (addEASE)  3,375 mg IntraVENous Q8H    hydrALAZINE  (APRESOLINE ) injection 10 mg  10 mg IntraVENous Q6H PRN    acetaminophen  (TYLENOL ) tablet 650 mg  650 mg Oral Q4H PRN    guaiFENesin  (ROBITUSSIN) 100 MG/5ML liquid 200 mg  200 mg Oral Q4H PRN    glucose chewable tablet 16 g  4 tablet Oral PRN    dextrose  bolus 10% 125 mL  125 mL IntraVENous PRN    Or    dextrose  bolus 10% 250 mL  250 mL IntraVENous PRN    glucagon  injection 1 mg  1 mg SubCUTAneous PRN    dextrose  10 % infusion   IntraVENous Continuous PRN    ipratropium 0.5 mg-albuterol  2.5 mg (DUONEB ) nebulizer solution 1 Dose  1 Dose Inhalation Q4H PRN    prochlorperazine  (COMPAZINE ) injection 10 mg  10 mg IntraVENous Q6H PRN    sodium chloride  flush 0.9 % injection 5-40 mL  5-40 mL IntraVENous PRN    0.9 % sodium chloride  infusion   IntraVENous PRN    enoxaparin  Sodium (LOVENOX ) injection 30 mg  30 mg SubCUTAneous Daily    bisacodyl  (DULCOLAX) EC tablet 5 mg  5 mg  Oral Daily PRN     albuterol  (PROVENTIL ) (2.5 MG/3ML) 0.083% nebulizer solution 2.5 mg  2.5 mg Nebulization Q4H PRN    benzonatate  (TESSALON ) capsule 100 mg  100 mg Oral TID PRN            Lab Review:     Recent Labs     10/20/24  0110 10/21/24  0022 10/22/24  0414   WBC 17.4* 20.8* 10.9   HGB 10.0* 10.5* 10.0*   HCT 30.9* 33.5* 33.0*   PLT 540* 511* 405*     Recent Labs     10/20/24  0220 10/20/24  0536 10/21/24  0022 10/22/24  0414   NA 135* 137 133* 136   K 5.3* 3.5 3.3* 3.6   CL 99 99 97* 100   CO2 27 27 31* 30*   BUN 24* 24* 25* 28*   MG  --   --  1.8 2.4*   PHOS  --   --  1.5*  --    ALT 8* 6*  --   --      No components found for: [facility]        "

## 2024-10-22 NOTE — Progress Notes (Cosign Needed)
 "[facility]      Name: Chelsea Schultz MRN: [id number]   DOB: 1926-08-08 Hospital: [facility]   Date: 10/22/2024        Impression Plan   Acute respiratory failure  Hypoxia  Bilateral PNA  Hypoxia  ILD exacerbation  Pulmonary fibrosis- UIP  GERD  Leukocytosis  HxofCVA  Delirium  Dysphagia, but no aspiration/penetration per MBS  UTI               Continue to wean O2 as tolerated, currently on 2LNC o2  On zosyn  now and NPO given worsening suspected aspiration PNA on CXR and intermittent unresponsiveness/lethargy  Check PNA labs- mrsa screen, strep/legionella urine Ag  Continue weaning steroids   RVP negative   BP control  OOB as tolerated, attempt to have her sitting in high Fowlers  Diet as per speech therapy (soft bite sized/thin liquids) once more alert; now NPO  Palliative care is following- ongoing goals of care discussion with family who continues to want current measures and full code for now.    Pulmonary will be available as needed tomorrow (holiday team) and will see again on Friday if she remains inpatient. Call for questions/concerns.       Radiology  ( personally reviewed) CTA chest 10/11/24: Bilateral pulmonary fibrosis in UIP pattern with superimposed  GG infiltrates   ABG Invalid input(s): PHI, PO2I, PCO2I       Subjective     Cc: abnormal CT chest    89 yo with PMHx of dementia, CVA, spinal stenosis, chronic debilitation and pulmonary fibrosis who presented to the ER on 12/19 with increased somnolence and  weakness. Pt was found to be hypoxic. CT chest revealed bilateral GG infiltrates superimposed on known pulmonary fibrosis. Pt seen at [facility] in 2017 with plan for PFTs and repeat CT chest, but did not return for follow up. Pt is altered. Wakes up and answers some questions. Daughter denies any hx of GERD/episodes of vomiting. Denies any witnessed choking or aspiration episodes. Never smoker.     12/23: Arousable to voice, able to state name and open eyes.  On  3.5 L, satting 91%. No leukocytosis on a.m. labs.  10-17-24: remains on 5 LPM.  Having issues with pulling off her O2 due to delirium.    10/20/24: On 1.5LNC o2. She denies any SOB.   10/21/24: Unresponsive to verbal and tactile stimuli. Family notes unresponsiveness after pt was able to take her morning meds. On 2LNC o2.  12/31: lethargic this AM, family reports she was opening her eyes and more alert before taking pain meds this AM.   Remains on 2LNC o2. No change to goals of care per family who wishes to continue to monitor with current measures.    Review of Systems:  Review of systems not obtained due to patient factors.    Past Medical History:   Diagnosis Date    Arthritis     CVA (cerebral infarction)     Diabetes (HCC) 01/09/2015    Hemoglobin A1C 6.4 (per records, done at outside facility)    GERD (gastroesophageal reflux disease)     History of recurrent UTIs     on chronic antibiotic prophylaxis, followed by urology    HLD (hyperlipidemia) 01/09/2015    Total Chol 141, Tri 49, HDL 59, LDL 72    Hypertension     Neuropathy     Osteopetrosis     Spinal stenosis       Past  Surgical History:   Procedure Laterality Date    ORTHOPEDIC SURGERY      Lumbar laminectomy    ORTHOPEDIC SURGERY      Lumbar fusion      Prior to Admission medications   Medication Sig Start Date End Date Taking? Authorizing Provider   pregabalin  (LYRICA ) 100 MG capsule TAKE 1 CAPSULE BY MOUTH DAILY AS NEEDED FOR NERVE PAIN  Patient taking differently: Take 1 capsule by mouth daily. TAKE 1 CAPSULE BY MOUTH DAILY FOR NERVE PAIN 09/16/24 12/17/24 Yes Guadelupe Mace POUR, MD   amLODIPine  (NORVASC ) 10 MG tablet TAKE 1 TABLET(10 MG) BY MOUTH EVERY NIGHT  Patient taking differently: Take 1 tablet by mouth daily TAKE 1 TABLET(10 MG) BY MOUTH EVERY NIGHT 07/17/24  Yes Ledwith, Alexandra G, MD   mirabegron  (MYRBETRIQ ) 50 MG TB24 Take 50 mg by mouth daily 06/26/23  Yes Ledwith, Alexandra G, MD   metFORMIN  (GLUCOPHAGE ) 500 MG tablet TAKE 1 TABLET BY MOUTH  TWICE DAILY WITH A MEAL 09/02/24   Ledwith, Alexandra G, MD   famotidine  (PEPCID ) 40 MG tablet TAKE 1/2 TABLET BY MOUTH EVERY MORNING AND 1/2 TABLET AT BEDTIME 08/21/24   Ledwith, Alexandra G, MD   losartan  (COZAAR ) 100 MG tablet TAKE 1 TABLET BY MOUTH DAILY 08/21/24 08/16/25  Ledwith, Alexandra G, MD   Vaginal Lubricant (VAGISIL LUBRICANT) GEL Place vaginally    [provider]   vitamin B-12 (CYANOCOBALAMIN) 1000 MCG tablet     [provider]   Cholecalciferol  (VITAMIN D -3 PO) Take by mouth    [provider]   polyethylene glycol (GLYCOLAX ) 17 GM/SCOOP powder Take 17 g by mouth daily as needed (constipation) 08/01/24   Guadelupe Mace POUR, MD   atorvastatin  (LIPITOR ) 10 MG tablet Take 1 tablet by mouth daily 08/01/24   Uhlar, Adrianna K, MD   carvedilol  (COREG ) 3.125 MG tablet Take 1 tablet by mouth 2 times daily 08/01/24   Uhlar, Adrianna K, MD   Diabetic Shoe MISC by Does not apply route  Patient not taking: Reported on 08/19/2024 08/01/24   Guadelupe Mace POUR, MD   METAMUCIL FIBER PO Take by mouth    [provider]   trospium  (SANCTURA ) 20 MG tablet Take 1 tablet by mouth daily    [provider]   acetaminophen  (TYLENOL ) 650 MG extended release tablet Take 1 tablet by mouth daily as needed    Automatic Reconciliation, Ar   ascorbic acid (VITAMIN C) 1000 MG tablet Take 1 tablet by mouth daily    Automatic Reconciliation, Ar   aspirin 81 MG EC tablet ceived the following from [facility]: Outside name: aspirin delayed-release 81 mg tablet 10/31/21   Automatic Reconciliation, Ar   diclofenac sodium (VOLTAREN) 1 % GEL Apply 2 g topically 4 times daily    Automatic Reconciliation, Ar   docusate (COLACE, DULCOLAX) 100 MG CAPS Take 100 mg by mouth daily  Patient not taking: Reported on 08/19/2024    Automatic Reconciliation, Ar   Estradiol (VAGIFEM) 10 MCG TABS vaginal tablet Place 1 tablet vaginally  Patient not taking: Reported on 08/19/2024 12/01/21 12/01/22   Automatic Reconciliation, Ar     @CMEDSCH @  Allergies   Allergen Reactions    Nitrofurantoin Rash     Was placed on it 09/2019 and pt noted rash on her hand immediately after starting antibx. Will add it back to her allergy list.      Social History     Tobacco Use  Smoking status: Never    Smokeless tobacco: Never   Substance Use Topics    Alcohol use: No      Family History   Problem Relation Age of Onset    Stroke Father     Cancer Sister     Hypertension Father     Hypertension Mother     Stroke Mother     Diabetes Mother     Stroke Sister           Laboratory: I have personally reviewed the critical care flowsheet and labs.     Recent Labs     10/20/24  0110 10/21/24  0022 10/22/24  0414   WBC 17.4* 20.8* 10.9   HGB 10.0* 10.5* 10.0*   HCT 30.9* 33.5* 33.0*   PLT 540* 511* 405*     Recent Labs     10/20/24  0220 10/20/24  0536 10/21/24  0022 10/22/24  0414   NA 135* 137 133* 136   K 5.3* 3.5 3.3* 3.6   CL 99 99 97* 100   CO2 27 27 31* 30*   BUN 24* 24* 25* 28*   MG  --   --  1.8 2.4*   PHOS  --   --  1.5*  --    ALT 8* 6*  --   --        Objective:     Mode Rate Tidal Volume Pressure FiO2 PEEP                    Vital Signs:     TMAX(24)      Intake/Output:   Last shift:         Last 3 shifts: No intake/output data recorded.[id number] intake or output data in the 24 hours ending 10/22/24 1014    EXAM:   GENERAL:no acute distress HEENT:  EOMI, no alar flaring or epistaxis, oral mucosa moist without cyanosis, LUNGS: CTA, no w/r/r HEART:  Regular rate and rhythm with no MGR; no edema is present, ABDOMEN:  soft with no tenderness, EXTREMITIES:  warm with no cyanosis, SKIN:  no jaundice or ecchymosis, and NEUROLOGIC:  unresponsive to verbal and tactile stimuli,     Personally reviewed CXR imaging and radiology report:   12/30 CXR: lung volumes continue to be low with bilateral interstitial airspace opacities right greater than left.     "

## 2024-10-22 NOTE — Care Coordination (Signed)
"  Case Management Note                CM notified by palliative care family plans to transition to comfort measures. CM following for any needs.          ___________________________________________   [name], RN Case Manager  10/22/2024   3:49 PM     "

## 2024-10-22 NOTE — Progress Notes (Signed)
"  Palliative Medicine    Code Status: DNR    Advance Care Planning:    Primary Decision Maker: Rebecca, [place] Child [phone]    Primary Decision Maker: Manya [name] JASMINE Child [phone]    Pt does not have AMD on file.  In absence of verified Medical POA, pt's adult children share surrogate decision making authority, with decisions falling to the majority.     Patient / Family Encounter Documentation    Participants (names):  Dtrs Administrator, Civil Service (via FaceTime), [name] Jori Cramp and Barnie, son Herndon (via FaceTime), granddtr Leotis, Palliative Medicine (NP Franchot KEN Setter)    Narrative: Met with family outside of room to provide support and continue goc conversation.  Family members had already come to a decision for DNR/DNI, recognizing that pt's overall condition is not showing signs of improvement.  Family members were of varying beliefs as to whether IV fluids/antibiotics should continue vs transitioning to comfort measures today.  Several advocated to continue antibiotics/fluids to allow time for more family to come say their goodbyes; others expressed belief that this would only be prolonging pt's suffering.  Family ultimately agreed to continue antibiotics/fluids for 1-2 days, then transition to comfort measures, with the understanding that transition to CMO can happen sooner if pt is showing signs of discomfort/distress.     Psychosocial Issues Identified/ Resilience Factors: Family members were tearful, several expressed that they have made their peace with pt's impending death, shared that pt herself has expressed a readiness to die.  Family spoke of the long, fulfilling life pt has lived, shared of pt teaching her children to raise their own kids to be independent/self-sufficient.  No spiritual concerns identified; Chaplains are following for support as needed.     Caregiver Burden: Family is not able to care for pt at home at this time; [name] shared that she cared for her father at  home, was unable to enter his bedroom for years after he died.  Does the caregiver feel confident administering medication? Not at this time  Does the caregiver need any help connecting with community resources? Not at this time  Does the caregiver feel confident assisting with activities of daily living? Not at this time    Goals of Care / Plan: Continue IV antibiotics/fluids for the next 24-48 hrs to allow family time to say their goodbyes, then transition to comfort measures.  No escalation of care if condition were to worsen in the meantime.  Family members were in agreement that comfort measures would be initiated sooner if pt shows signs of pain/distress.  DNR/DNI order has been entered.  Physician, RN, and CM were updated by NP re: above.  Will continue to follow for support.     Thank you for including Palliative Medicine in the care of Ms. Gragert.     [address] [place], LCSW, ACHP-SW  [phone])  "

## 2024-10-22 NOTE — Progress Notes (Signed)
"       Nutrition Note    RD alerted to poor PO intake and new NPO order given aspiration on SBS diet order. PO intake inadequate in last several days. GOC discussions with Palliative Care are ongoing. Spoke with patients family regarding enteral nutrition support which is not advisable given advanced age + dementia if swallow function is not thought to have strong chance of recovery. Reached out to palliative care to ask they continue this conversation during planned meeting this afternoon. Will follow up with nutrition assessment + recs if family decides against comfort feeding.     Electronically signed by Victory Ranger, MS, RD on 10/22/24 at 10:56 AM EST    "

## 2024-10-23 LAB — POCT GLUCOSE
POC Glucose: 143 mg/dL — ABNORMAL HIGH (ref 65–117)
POC Glucose: 172 mg/dL — ABNORMAL HIGH (ref 65–117)

## 2024-10-23 MED ORDER — BENZOCAINE 20 % MT GEL
20 | Freq: Three times a day (TID) | OROMUCOSAL | Status: DC
Start: 2024-10-23 — End: 2024-11-03
  Administered 2024-10-23 – 2024-11-03 (×14): via OROMUCOSAL

## 2024-10-23 MED ORDER — PIPERACILLIN SOD-TAZOBACTAM SO 3.375 (3-0.375) G IV SOLR
3.375 | Freq: Three times a day (TID) | INTRAVENOUS | Status: DC
Start: 2024-10-23 — End: 2024-10-24
  Administered 2024-10-24 (×2): 3375 mg via INTRAVENOUS

## 2024-10-23 MED ORDER — LORAZEPAM 2 MG/ML IJ SOLN
2 | Freq: Once | INTRAMUSCULAR | Status: AC
Start: 2024-10-23 — End: 2024-10-22
  Administered 2024-10-23: 04:00:00 0.5 mg via INTRAVENOUS

## 2024-10-23 MED FILL — LORAZEPAM 2 MG/ML IJ SOLN: 2 mg/mL | INTRAMUSCULAR | Qty: 1 | Fill #0

## 2024-10-23 MED FILL — PIPERACILLIN SOD-TAZOBACTAM SO 3.375 (3-0.375) G IV SOLR: 3.375 (3-0.375) g | INTRAVENOUS | Qty: 3375 | Fill #0

## 2024-10-23 MED FILL — ACETAMINOPHEN 650 MG RE SUPP: 650 mg | RECTAL | Qty: 1 | Fill #0

## 2024-10-23 MED FILL — ORAJEL MOUTH-AID 0.02-20-0.1 % MT GEL: 0.02-20-0.1 % | OROMUCOSAL | Qty: 11.9 | Fill #0

## 2024-10-23 NOTE — Plan of Care (Signed)
 "  Problem: Skin/Tissue Integrity  Goal: Skin integrity remains intact  Description: 1.  Monitor for areas of redness and/or skin breakdown  2.  Assess vascular access sites hourly  3.  Every 4-6 hours minimum:  Change oxygen saturation probe site  4.  Every 4-6 hours:  If on nasal continuous positive airway pressure, respiratory therapy assess nares and determine need for appliance change or resting period  10/23/2024 1501 by Ladell Ming, RN  Outcome: Progressing  10/23/2024 0409 by Mannie Sober, RN  Outcome: Progressing  Flowsheets (Taken 10/22/2024 2157)  Skin Integrity Remains Intact: Monitor for areas of redness and/or skin breakdown     Problem: Safety - Adult  Goal: Free from fall injury  10/23/2024 1501 by Ladell Ming, RN  Outcome: Progressing  10/23/2024 0409 by Mannie Sober, RN  Outcome: Progressing     Problem: Chronic Conditions and Co-morbidities  Goal: Patient's chronic conditions and co-morbidity symptoms are monitored and maintained or improved  10/23/2024 1501 by Ladell Ming, RN  Outcome: Progressing  10/23/2024 0409 by Mannie Sober, RN  Outcome: Progressing  Flowsheets (Taken 10/22/2024 2157)  Care Plan - Patient's Chronic Conditions and Co-Morbidity Symptoms are Monitored and Maintained or Improved: Monitor and assess patient's chronic conditions and comorbid symptoms for stability, deterioration, or improvement     Problem: Discharge Planning  Goal: Discharge to home or other facility with appropriate resources  10/23/2024 1501 by Ladell Ming, RN  Outcome: Progressing  10/23/2024 0409 by Mannie Sober, RN  Outcome: Progressing  Flowsheets (Taken 10/22/2024 2157)  Discharge to home or other facility with appropriate resources: Identify barriers to discharge with patient and caregiver     Problem: Respiratory - Adult  Goal: Achieves optimal ventilation and oxygenation  10/23/2024 1501 by Ladell Ming, RN  Outcome: Progressing  10/23/2024 0409 by Mannie Sober, RN  Outcome:  Progressing  Flowsheets (Taken 10/22/2024 2157)  Achieves optimal ventilation and oxygenation: Assess for changes in respiratory status     Problem: Confusion  Goal: Confusion, delirium, dementia, or psychosis is improved or at baseline  Description: INTERVENTIONS:  1. Assess for possible contributors to thought disturbance, including medications, impaired vision or hearing, underlying metabolic abnormalities, dehydration, psychiatric diagnoses, and notify attending LIP  2. Institute high risk fall precautions, as indicated  3. Provide frequent short contacts to provide reality reorientation, refocusing and direction  4. Decrease environmental stimuli, including noise as appropriate  5. Monitor and intervene to maintain adequate nutrition, hydration, elimination, sleep and activity  6. If unable to ensure safety without constant attention obtain sitter and review sitter guidelines with assigned personnel  7. Initiate Psychosocial CNS and Spiritual Care consult, as indicated  10/23/2024 1501 by Ladell Ming, RN  Outcome: Progressing  10/23/2024 0409 by Mannie Sober, RN  Outcome: Progressing  Flowsheets (Taken 10/22/2024 2157)  Effect of thought disturbance (confusion, delirium, dementia, or psychosis) are managed with adequate functional status: Assess for contributors to thought disturbance, including medications, impaired vision or hearing, underlying metabolic abnormalities, dehydration, psychiatric diagnoses, notify LIP     Problem: Pain  Goal: Verbalizes/displays adequate comfort level or baseline comfort level  10/23/2024 1501 by Ladell Ming, RN  Outcome: Progressing  10/23/2024 0409 by Mannie Sober, RN  Outcome: Progressing     Problem: Neurosensory - Adult  Goal: Achieves stable or improved neurological status  10/23/2024 1501 by Ladell Ming, RN  Outcome: Progressing  10/23/2024 0409 by Mannie Sober, RN  Outcome: Progressing  Flowsheets (Taken 10/22/2024 2157)  Achieves stable or improved  neurological status: Assess for and report changes in neurological status  Goal: Achieves maximal functionality and self care  10/23/2024 1501 by Ladell Ming, RN  Outcome: Progressing  10/23/2024 0409 by Mannie Sober, RN  Outcome: Progressing  Flowsheets (Taken 10/22/2024 2157)  Achieves maximal functionality and self care: Monitor swallowing and airway patency with patient fatigue and changes in neurological status     Problem: Cardiovascular - Adult  Goal: Maintains optimal cardiac output and hemodynamic stability  10/23/2024 1501 by Ladell Ming, RN  Outcome: Progressing  10/23/2024 0409 by Mannie Sober, RN  Outcome: Progressing  Flowsheets (Taken 10/22/2024 2157)  Maintains optimal cardiac output and hemodynamic stability: Monitor blood pressure and heart rate  Goal: Absence of cardiac dysrhythmias or at baseline  10/23/2024 1501 by Ladell Ming, RN  Outcome: Progressing  10/23/2024 0409 by Mannie Sober, RN  Outcome: Progressing  Flowsheets (Taken 10/22/2024 2157)  Absence of cardiac dysrhythmias or at baseline: Monitor cardiac rate and rhythm     Problem: Skin/Tissue Integrity - Adult  Goal: Skin integrity remains intact  Description: 1.  Monitor for areas of redness and/or skin breakdown  2.  Assess vascular access sites hourly  3.  Every 4-6 hours minimum:  Change oxygen saturation probe site  4.  Every 4-6 hours:  If on nasal continuous positive airway pressure, respiratory therapy assess nares and determine need for appliance change or resting period  10/23/2024 1501 by Ladell Ming, RN  Outcome: Progressing  10/23/2024 0409 by Mannie Sober, RN  Outcome: Progressing  Flowsheets (Taken 10/22/2024 2157)  Skin Integrity Remains Intact: Monitor for areas of redness and/or skin breakdown  Goal: Incisions, wounds, or drain sites healing without S/S of infection  10/23/2024 1501 by Ladell Ming, RN  Outcome: Progressing  10/23/2024 0409 by Mannie Sober, RN  Outcome: Progressing  Goal: Oral  mucous membranes remain intact  10/23/2024 1501 by Ladell Ming, RN  Outcome: Progressing  10/23/2024 0409 by Mannie Sober, RN  Outcome: Progressing     Problem: Musculoskeletal - Adult  Goal: Return mobility to safest level of function  10/23/2024 1501 by Ladell Ming, RN  Outcome: Progressing  10/23/2024 0409 by Mannie Sober, RN  Outcome: Progressing  Flowsheets (Taken 10/22/2024 2157)  Return Mobility to Safest Level of Function: Assess patient stability and activity tolerance for standing, transferring and ambulating with or without assistive devices  Goal: Maintain proper alignment of affected body part  10/23/2024 1501 by Ladell Ming, RN  Outcome: Progressing  10/23/2024 0409 by Mannie Sober, RN  Outcome: Progressing  Flowsheets (Taken 10/22/2024 2157)  Maintain proper alignment of affected body part: Support and protect limb and body alignment per provider's orders  Goal: Return ADL status to a safe level of function  10/23/2024 1501 by Ladell Ming, RN  Outcome: Progressing  10/23/2024 0409 by Mannie Sober, RN  Outcome: Progressing  Flowsheets (Taken 10/22/2024 2157)  Return ADL Status to a Safe Level of Function: Administer medication as ordered     Problem: Gastrointestinal - Adult  Goal: Minimal or absence of nausea and vomiting  10/23/2024 1501 by Ladell Ming, RN  Outcome: Progressing  10/23/2024 0409 by Mannie Sober, RN  Outcome: Progressing  Flowsheets (Taken 10/22/2024 2157)  Minimal or absence of nausea and vomiting: Administer IV fluids as ordered to ensure adequate hydration  Goal: Maintains or returns to baseline bowel function  10/23/2024 1501 by Ladell Ming, RN  Outcome: Progressing  10/23/2024 0409 by Mannie Sober, RN  Outcome: Progressing  Flowsheets (Taken 10/22/2024 2157)  Maintains or returns to baseline bowel function: Assess bowel function  Goal: Maintains adequate nutritional intake  10/23/2024 1501 by Ladell Ming, RN  Outcome: Progressing  10/23/2024 0409 by  Mannie Sober, RN  Outcome: Progressing  Flowsheets (Taken 10/22/2024 2157)  Maintains adequate nutritional intake: Monitor percentage of each meal consumed     Problem: Genitourinary - Adult  Goal: Absence of urinary retention  10/23/2024 1501 by Ladell Ming, RN  Outcome: Progressing  10/23/2024 0409 by Mannie Sober, RN  Outcome: Progressing  Flowsheets (Taken 10/22/2024 2157)  Absence of urinary retention: Assess patients ability to void and empty bladder     Problem: Infection - Adult  Goal: Absence of infection at discharge  10/23/2024 1501 by Ladell Ming, RN  Outcome: Progressing  10/23/2024 0409 by Mannie Sober, RN  Outcome: Progressing  Flowsheets (Taken 10/22/2024 2157)  Absence of infection at discharge: Assess and monitor for signs and symptoms of infection  Goal: Absence of infection during hospitalization  10/23/2024 1501 by Ladell Ming, RN  Outcome: Progressing  10/23/2024 0409 by Mannie Sober, RN  Outcome: Progressing  Flowsheets (Taken 10/22/2024 2157)  Absence of infection during hospitalization: Assess and monitor for signs and symptoms of infection     Problem: Metabolic/Fluid and Electrolytes - Adult  Goal: Electrolytes maintained within normal limits  10/23/2024 1501 by Ladell Ming, RN  Outcome: Progressing  10/23/2024 0409 by Mannie Sober, RN  Outcome: Progressing  Flowsheets (Taken 10/22/2024 2157)  Electrolytes maintained within normal limits: Monitor labs and assess patient for signs and symptoms of electrolyte imbalances  Goal: Hemodynamic stability and optimal renal function maintained  10/23/2024 1501 by Ladell Ming, RN  Outcome: Progressing  10/23/2024 0409 by Mannie Sober, RN  Outcome: Progressing  Flowsheets (Taken 10/22/2024 2157)  Hemodynamic stability and optimal renal function maintained: Monitor labs and assess for signs and symptoms of volume excess or deficit  Goal: Glucose maintained within prescribed range  10/23/2024 1501 by Ladell Ming,  RN  Outcome: Progressing  10/23/2024 0409 by Mannie Sober, RN  Outcome: Progressing  Flowsheets (Taken 10/22/2024 2157)  Glucose maintained within prescribed range: Monitor blood glucose as ordered     Problem: Hematologic - Adult  Goal: Maintains hematologic stability  10/23/2024 1501 by Ladell Ming, RN  Outcome: Progressing  10/23/2024 0409 by Mannie Sober, RN  Outcome: Progressing  Flowsheets (Taken 10/22/2024 2157)  Maintains hematologic stability: Assess for signs and symptoms of bleeding or hemorrhage     Problem: Safety - Medical Restraint  Goal: Remains free of injury from restraints (Restraint for Interference with Medical Device)  Description: INTERVENTIONS:  1. Determine that other, less restrictive measures have been tried or would not be effective before applying the restraint  2. Evaluate the patient's condition at the time of restraint application  3. Inform patient/family regarding the reason for restraint  4. Q2H: Monitor safety, psychosocial status, comfort, nutrition and hydration  10/23/2024 1501 by Ladell Ming, RN  Outcome: Progressing  10/23/2024 0409 by Mannie Sober, RN  Outcome: Progressing     Problem: ABCDS Injury Assessment  Goal: Absence of physical injury  10/23/2024 1501 by Ladell Ming, RN  Outcome: Progressing  10/23/2024 0409 by Mannie Sober, RN  Outcome: Progressing     "

## 2024-10-23 NOTE — Progress Notes (Signed)
"  NUTRITION    RD PLAN OF CARE: PO for comfort      Chart reviewed.    Diet:Diet NPO      Pt is admitted with Hypoxemia [R09.02]  Severe sepsis (HCC) [A41.9, R65.20]  Community acquired pneumonia, unspecified laterality [J18.9].        Pt to be transitioned to comfort measures within next 24-48 hours.  Aggressive nutrition intervention is not indicated at this time.  Will follow and assist as needed.    Victory Ranger, MS, RD  "

## 2024-10-23 NOTE — Progress Notes (Signed)
 "    Hospitalist Progress Note      NAME:  Chelsea Schultz   DOB:  Jul 03, 1926  MRM:  [id number]    Date/Time: 10/23/2024  3:08 PM           Assessment / Plan:     89 yo Hx of HTN, DM, interstitial lung dz, dementia, presented w/ AMS, resp failure, hypoxia, pulm fibrosis, enterococcus UTI     Present on Admission:   Severe sepsis (HCC)   Pulmonary fibrosis (HCC)   Acute hypoxemic respiratory failure (HCC)   Community acquired pneumonia   Delirium    # Acute hypoxic respiratory failure  Multifactorial etiology.  Likely combination of pulmonary fibrosis and aspiration pneumonia.  Since patient is unable to tolerate p.o., she is being treated with IV Zosyn .  Supplemental oxygen as needed.  Currently on Solu-Medrol  40 mg IV daily.  Unable to transition to prednisone .    # Aspiration pneumonia  Imaging concerning for aspiration pneumonia.  MBS completed.  She remains at severe risk of aspiration, now that she is DNR/DNI and transitioning to comfort care, can allow pleasure feeds.  Maintain aspiration precautions.    # Acute metabolic encephalopathy  AMS multifactorial etiology.  Likely combination of underlying dementia and acute delirium.  On exam, she is oriented only to self.  No new focal deficits.  Delirium precautions using a combination of nonpharmacologic and pharmacologic strategies.  Nonpharmacologic interventions : Minimize nighttime interruptions, try to maintain normal sleep-wake cycle, optimize nutrition, frequently reorient.      Pharmacologic : Diazepam /Ativan  as needed.  Bedside sitter if needed.  Maintain fall precautions.    #ACP/goals of care  Palliative on board.  Family now agreeable for DNR/DNI.  Planning to transition to comfort care in the next 24 to 48 hours.  No escalation of care if her condition continues to deteriorate.  Appreciate palliative care assistance.    # Enterococcus UTI  Urine culture showed Enterococcus.  Continue Zosyn  as above.    # Hypercalcemia  Suspect secondary to  dehydration.  PTH within normal limits.  Continue IV fluids as above.    # Pleuritic chest pain  Suspect secondary to pneumonitis.  CTA chest showed no PE.  Follow clinically.    # Diabetes mellitus  A1c 7.0.  Stop Accu-Cheks.    # Hypertension  Stop   antihypertensives.      #BMI (Calculated): 19.98    I have personally reviewed the radiographs, laboratory data in Epic and decisions and statements above are based partially on this personal interpretation.                 Care Plan discussed with: Patient and Family    Discussed:  Code Status and Care Plan    Prophylaxis:  SCD's    Disposition:  Home w/Family           ___________________________________________________    Attending Physician: Debby JINNY Binder, MD        Subjective:     Chief Complaint: Denies any symptoms    ROS:    Patient was seen and examined at bedside.  Multiple family members present.  I personally updated the patient's daughter.  Patient is only minimally verbal.      Objective:       Vitals:          Last 24hrs VS reviewed since prior progress note. Most recent are:    Vitals:    10/23/24 0736   BP: ROLLEN)  158/62   Pulse: 63   Resp: 14   Temp: 97.3 F (36.3 C)   SpO2: 95%     SpO2 Readings from Last 6 Encounters:   10/23/24 95%   08/19/24 96%   06/26/23 97%   09/05/22 96%   06/19/22 96%   05/11/22 94%          Intake/Output Summary (Last 24 hours) at 10/23/2024 1508  Last data filed at 10/23/2024 9587  Gross per 24 hour   Intake --   Output 400 ml   Net -400 ml          Exam:     Physical Exam:    Gen: Chronically ill-appearing  HEENT:  Pink conjunctivae, PERRL, hearing intact to voice, moist mucous membranes  Neck:  Supple, without masses, thyroid non-tender  Resp:  No accessory muscle use, clear breath sounds without wheezes rales or rhonchi  Card:  No murmurs, normal S1, S2 without thrills, bruits or peripheral edema  Abd:  Soft, non-tender, non-distended, normoactive bowel sounds are present  Musc:  No cyanosis or clubbing  Skin:  No rashes or  ulcers, skin turgor is good  Neuro: No focal deficits, unable to assess sensations, reflexes 2+  Psych: Oriented only to self    Medications Reviewed: (see below)    Lab Data Reviewed: (see below)    ______________________________________________________________________    Medications:     Current Facility-Administered Medications   Medication Dose Route Frequency    diazePAM  (VALIUM ) injection 2.5 mg  2.5 mg IntraVENous Q4H PRN    LORazepam  (ATIVAN ) 2 MG/ML concentrated solution 0.5 mg  0.5 mg Oral Q4H PRN    HYDROmorphone  HCl PF (DILAUDID ) injection 0.25 mg  0.25 mg IntraVENous Q4H PRN    acetaminophen  (TYLENOL ) suppository 650 mg  650 mg Rectal Q6H    methylPREDNISolone  sodium succ (SOLU-MEDROL ) 40 mg in sterile water  1 mL injection  40 mg IntraVENous Daily    lactated ringers  infusion   IntraVENous Continuous    piperacillin -tazobactam (ZOSYN ) 3,375 mg in sodium chloride  0.9 % 50 mL IVPB (addEASE)  3,375 mg IntraVENous Q8H    hydrALAZINE  (APRESOLINE ) injection 10 mg  10 mg IntraVENous Q6H PRN    acetaminophen  (TYLENOL ) tablet 650 mg  650 mg Oral Q4H PRN    guaiFENesin  (ROBITUSSIN) 100 MG/5ML liquid 200 mg  200 mg Oral Q4H PRN    glucose chewable tablet 16 g  4 tablet Oral PRN    dextrose  bolus 10% 125 mL  125 mL IntraVENous PRN    Or    dextrose  bolus 10% 250 mL  250 mL IntraVENous PRN    glucagon  injection 1 mg  1 mg SubCUTAneous PRN    dextrose  10 % infusion   IntraVENous Continuous PRN    ipratropium 0.5 mg-albuterol  2.5 mg (DUONEB ) nebulizer solution 1 Dose  1 Dose Inhalation Q4H PRN    prochlorperazine  (COMPAZINE ) injection 10 mg  10 mg IntraVENous Q6H PRN    sodium chloride  flush 0.9 % injection 5-40 mL  5-40 mL IntraVENous PRN    0.9 % sodium chloride  infusion   IntraVENous PRN    bisacodyl  (DULCOLAX) EC tablet 5 mg  5 mg Oral Daily PRN    albuterol  (PROVENTIL ) (2.5 MG/3ML) 0.083% nebulizer solution 2.5 mg  2.5 mg Nebulization Q4H PRN    benzonatate  (TESSALON ) capsule 100 mg  100 mg Oral TID PRN             Lab Review:  Recent Labs     10/21/24  0022 10/22/24  0414   WBC 20.8* 10.9   HGB 10.5* 10.0*   HCT 33.5* 33.0*   PLT 511* 405*     Recent Labs     10/21/24  0022 10/22/24  0414   NA 133* 136   K 3.3* 3.6   CL 97* 100   CO2 31* 30*   BUN 25* 28*   MG 1.8 2.4*   PHOS 1.5*  --      No components found for: GLPOC        "

## 2024-10-23 NOTE — Plan of Care (Signed)
 "  Problem: Skin/Tissue Integrity  Goal: Skin integrity remains intact  Description: 1.  Monitor for areas of redness and/or skin breakdown  2.  Assess vascular access sites hourly  3.  Every 4-6 hours minimum:  Change oxygen saturation probe site  4.  Every 4-6 hours:  If on nasal continuous positive airway pressure, respiratory therapy assess nares and determine need for appliance change or resting period  Outcome: Progressing  Flowsheets (Taken 10/22/2024 2157)  Skin Integrity Remains Intact: Monitor for areas of redness and/or skin breakdown     Problem: Safety - Adult  Goal: Free from fall injury  Outcome: Progressing     Problem: Chronic Conditions and Co-morbidities  Goal: Patient's chronic conditions and co-morbidity symptoms are monitored and maintained or improved  Outcome: Progressing  Flowsheets (Taken 10/22/2024 2157)  Care Plan - Patient's Chronic Conditions and Co-Morbidity Symptoms are Monitored and Maintained or Improved: Monitor and assess patient's chronic conditions and comorbid symptoms for stability, deterioration, or improvement     Problem: Discharge Planning  Goal: Discharge to home or other facility with appropriate resources  Outcome: Progressing  Flowsheets (Taken 10/22/2024 2157)  Discharge to home or other facility with appropriate resources: Identify barriers to discharge with patient and caregiver     Problem: Respiratory - Adult  Goal: Achieves optimal ventilation and oxygenation  Outcome: Progressing  Flowsheets (Taken 10/22/2024 2157)  Achieves optimal ventilation and oxygenation: Assess for changes in respiratory status     Problem: Neurosensory - Adult  Goal: Achieves stable or improved neurological status  Outcome: Progressing  Flowsheets (Taken 10/22/2024 2157)  Achieves stable or improved neurological status: Assess for and report changes in neurological status  Goal: Achieves maximal functionality and self care  Outcome: Progressing  Flowsheets (Taken 10/22/2024  2157)  Achieves maximal functionality and self care: Monitor swallowing and airway patency with patient fatigue and changes in neurological status     Problem: Cardiovascular - Adult  Goal: Maintains optimal cardiac output and hemodynamic stability  Outcome: Progressing  Flowsheets (Taken 10/22/2024 2157)  Maintains optimal cardiac output and hemodynamic stability: Monitor blood pressure and heart rate  Goal: Absence of cardiac dysrhythmias or at baseline  Outcome: Progressing  Flowsheets (Taken 10/22/2024 2157)  Absence of cardiac dysrhythmias or at baseline: Monitor cardiac rate and rhythm     Problem: Skin/Tissue Integrity - Adult  Goal: Skin integrity remains intact  Description: 1.  Monitor for areas of redness and/or skin breakdown  2.  Assess vascular access sites hourly  3.  Every 4-6 hours minimum:  Change oxygen saturation probe site  4.  Every 4-6 hours:  If on nasal continuous positive airway pressure, respiratory therapy assess nares and determine need for appliance change or resting period  Outcome: Progressing  Flowsheets (Taken 10/22/2024 2157)  Skin Integrity Remains Intact: Monitor for areas of redness and/or skin breakdown  Goal: Incisions, wounds, or drain sites healing without S/S of infection  Outcome: Progressing  Goal: Oral mucous membranes remain intact  Outcome: Progressing     Problem: Musculoskeletal - Adult  Goal: Return mobility to safest level of function  Outcome: Progressing  Flowsheets (Taken 10/22/2024 2157)  Return Mobility to Safest Level of Function: Assess patient stability and activity tolerance for standing, transferring and ambulating with or without assistive devices  Goal: Maintain proper alignment of affected body part  Outcome: Progressing  Flowsheets (Taken 10/22/2024 2157)  Maintain proper alignment of affected body part: Support and protect limb and body alignment per provider's orders  Goal: Return ADL status to a safe level of function  Outcome:  Progressing  Flowsheets (Taken 10/22/2024 2157)  Return ADL Status to a Safe Level of Function: Administer medication as ordered     Problem: Gastrointestinal - Adult  Goal: Minimal or absence of nausea and vomiting  Outcome: Progressing  Flowsheets (Taken 10/22/2024 2157)  Minimal or absence of nausea and vomiting: Administer IV fluids as ordered to ensure adequate hydration  Goal: Maintains or returns to baseline bowel function  Outcome: Progressing  Flowsheets (Taken 10/22/2024 2157)  Maintains or returns to baseline bowel function: Assess bowel function  Goal: Maintains adequate nutritional intake  Outcome: Progressing  Flowsheets (Taken 10/22/2024 2157)  Maintains adequate nutritional intake: Monitor percentage of each meal consumed     Problem: Genitourinary - Adult  Goal: Absence of urinary retention  Outcome: Progressing  Flowsheets (Taken 10/22/2024 2157)  Absence of urinary retention: Assess patients ability to void and empty bladder     Problem: Infection - Adult  Goal: Absence of infection at discharge  Outcome: Progressing  Flowsheets (Taken 10/22/2024 2157)  Absence of infection at discharge: Assess and monitor for signs and symptoms of infection  Goal: Absence of infection during hospitalization  Outcome: Progressing  Flowsheets (Taken 10/22/2024 2157)  Absence of infection during hospitalization: Assess and monitor for signs and symptoms of infection     Problem: Metabolic/Fluid and Electrolytes - Adult  Goal: Electrolytes maintained within normal limits  Outcome: Progressing  Flowsheets (Taken 10/22/2024 2157)  Electrolytes maintained within normal limits: Monitor labs and assess patient for signs and symptoms of electrolyte imbalances  Goal: Hemodynamic stability and optimal renal function maintained  Outcome: Progressing  Flowsheets (Taken 10/22/2024 2157)  Hemodynamic stability and optimal renal function maintained: Monitor labs and assess for signs and symptoms of volume excess or  deficit  Goal: Glucose maintained within prescribed range  Outcome: Progressing  Flowsheets (Taken 10/22/2024 2157)  Glucose maintained within prescribed range: Monitor blood glucose as ordered     Problem: Hematologic - Adult  Goal: Maintains hematologic stability  Outcome: Progressing  Flowsheets (Taken 10/22/2024 2157)  Maintains hematologic stability: Assess for signs and symptoms of bleeding or hemorrhage     Problem: Confusion  Goal: Confusion, delirium, dementia, or psychosis is improved or at baseline  Description: INTERVENTIONS:  1. Assess for possible contributors to thought disturbance, including medications, impaired vision or hearing, underlying metabolic abnormalities, dehydration, psychiatric diagnoses, and notify attending LIP  2. Institute high risk fall precautions, as indicated  3. Provide frequent short contacts to provide reality reorientation, refocusing and direction  4. Decrease environmental stimuli, including noise as appropriate  5. Monitor and intervene to maintain adequate nutrition, hydration, elimination, sleep and activity  6. If unable to ensure safety without constant attention obtain sitter and review sitter guidelines with assigned personnel  7. Initiate Psychosocial CNS and Spiritual Care consult, as indicated  Outcome: Progressing  Flowsheets (Taken 10/22/2024 2157)  Effect of thought disturbance (confusion, delirium, dementia, or psychosis) are managed with adequate functional status: Assess for contributors to thought disturbance, including medications, impaired vision or hearing, underlying metabolic abnormalities, dehydration, psychiatric diagnoses, notify LIP     Problem: Pain  Goal: Verbalizes/displays adequate comfort level or baseline comfort level  Outcome: Progressing     Problem: Safety - Medical Restraint  Goal: Remains free of injury from restraints (Restraint for Interference with Medical Device)  Description: INTERVENTIONS:  1. Determine that other, less  restrictive measures have been tried or would not be effective  before applying the restraint  2. Evaluate the patient's condition at the time of restraint application  3. Inform patient/family regarding the reason for restraint  4. Q2H: Monitor safety, psychosocial status, comfort, nutrition and hydration  Outcome: Progressing     Problem: ABCDS Injury Assessment  Goal: Absence of physical injury  Outcome: Progressing     "

## 2024-10-23 DEATH — deceased

## 2024-10-24 LAB — POCT GLUCOSE: POC Glucose: 108 mg/dL (ref 65–117)

## 2024-10-24 MED ORDER — OXYCODONE HCL 5 MG/5ML PO SOLN
5 | ORAL | Status: DC | PRN
Start: 2024-10-24 — End: 2024-11-03

## 2024-10-24 MED ORDER — HYDROMORPHONE HCL PF 1 MG/ML IJ SOLN
1 | INTRAMUSCULAR | Status: DC | PRN
Start: 2024-10-24 — End: 2024-10-30
  Administered 2024-10-29: 23:00:00 0.25 mg via INTRAVENOUS

## 2024-10-24 MED ORDER — LORAZEPAM 2 MG/ML PO CONC
2 | ORAL | Status: DC | PRN
Start: 2024-10-24 — End: 2024-11-03
  Administered 2024-10-26 – 2024-11-02 (×4): 0.5 mg via SUBLINGUAL

## 2024-10-24 MED ORDER — GLYCOPYRROLATE 0.2 MG/ML IJ SOLN
0.2 | INTRAMUSCULAR | Status: DC | PRN
Start: 2024-10-24 — End: 2024-11-03
  Administered 2024-10-26 – 2024-11-02 (×3): 0.2 mg via INTRAVENOUS

## 2024-10-24 MED ORDER — DIAZEPAM 5 MG/ML IJ SOLN
5 | INTRAMUSCULAR | Status: DC | PRN
Start: 2024-10-24 — End: 2024-11-03
  Administered 2024-10-25 – 2024-11-03 (×3): 2.5 mg via INTRAVENOUS

## 2024-10-24 MED ORDER — ONDANSETRON HCL 4 MG/2ML IJ SOLN
4 | Freq: Four times a day (QID) | INTRAMUSCULAR | Status: DC | PRN
Start: 2024-10-24 — End: 2024-11-03

## 2024-10-24 MED FILL — PIPERACILLIN SOD-TAZOBACTAM SO 3.375 (3-0.375) G IV SOLR: 3.375 (3-0.375) g | INTRAVENOUS | Qty: 3375 | Fill #0

## 2024-10-24 MED FILL — ACETAMINOPHEN 650 MG RE SUPP: 650 mg | RECTAL | Qty: 1 | Fill #0

## 2024-10-24 MED FILL — LORAZEPAM 2 MG/ML PO CONC: 2 mg/mL | ORAL | Qty: 0.5 | Fill #0

## 2024-10-24 MED FILL — METHYLPREDNISOLONE SODIUM SUCC 40 MG IJ SOLR: 40 mg | INTRAMUSCULAR | Qty: 40 | Fill #0

## 2024-10-24 NOTE — Progress Notes (Signed)
"       Palliative Medicine Social Work Note      Palliative Medicine (NP Franchot, LCSW Delesia Martinek) met with dtrs Jori and Marval in person; dtrs [name] Notice, and Kim participated in conversation via phone.  Family is prepared to move forward with comfort measures today, expressed concern that pt's extremities are becoming more swollen, do not wish to continue with measures that might cause more burden than benefit.  Family shared that pt has been asking to eat (specifically requested tomato soup); diet order was entered yesterday, family is offering sips/bites as pt tolerates.  Discussed hospice consult in the coming days, either GIP or routine hospice level of care in a facility, depending on pt's care needs/symptoms.  Palliative team will continue to follow for ongoing support to pt/family.    Thank you for including Palliative Medicine in the care of Ms. Schrager.     Terrina Docter Bonna Maxin, [place], ACHP-SW  Palliative Medicine:  [phone])  "

## 2024-10-24 NOTE — Plan of Care (Signed)
 "  Problem: Skin/Tissue Integrity  Goal: Skin integrity remains intact  Description: 1.  Monitor for areas of redness and/or skin breakdown  2.  Assess vascular access sites hourly  3.  Every 4-6 hours minimum:  Change oxygen saturation probe site  4.  Every 4-6 hours:  If on nasal continuous positive airway pressure, respiratory therapy assess nares and determine need for appliance change or resting period  Outcome: Progressing  Flowsheets (Taken 10/23/2024 2225)  Skin Integrity Remains Intact: Monitor for areas of redness and/or skin breakdown     Problem: Safety - Adult  Goal: Free from fall injury  Outcome: Progressing     Problem: Chronic Conditions and Co-morbidities  Goal: Patient's chronic conditions and co-morbidity symptoms are monitored and maintained or improved  Outcome: Progressing     Problem: Discharge Planning  Goal: Discharge to home or other facility with appropriate resources  Outcome: Progressing     Problem: Respiratory - Adult  Goal: Achieves optimal ventilation and oxygenation  Outcome: Progressing  Flowsheets (Taken 10/23/2024 2225)  Achieves optimal ventilation and oxygenation: Assess for changes in respiratory status     Problem: Neurosensory - Adult  Goal: Achieves stable or improved neurological status  Outcome: Progressing  Flowsheets (Taken 10/23/2024 2225)  Achieves stable or improved neurological status: Assess for and report changes in neurological status  Goal: Achieves maximal functionality and self care  Outcome: Progressing  Flowsheets (Taken 10/23/2024 2225)  Achieves maximal functionality and self care: Monitor swallowing and airway patency with patient fatigue and changes in neurological status     Problem: Cardiovascular - Adult  Goal: Maintains optimal cardiac output and hemodynamic stability  Outcome: Progressing  Goal: Absence of cardiac dysrhythmias or at baseline  Outcome: Progressing     Problem: Skin/Tissue Integrity - Adult  Goal: Skin integrity remains intact  Description:  1.  Monitor for areas of redness and/or skin breakdown  2.  Assess vascular access sites hourly  3.  Every 4-6 hours minimum:  Change oxygen saturation probe site  4.  Every 4-6 hours:  If on nasal continuous positive airway pressure, respiratory therapy assess nares and determine need for appliance change or resting period  Outcome: Progressing  Flowsheets (Taken 10/23/2024 2225)  Skin Integrity Remains Intact: Monitor for areas of redness and/or skin breakdown  Goal: Incisions, wounds, or drain sites healing without S/S of infection  Outcome: Progressing  Goal: Oral mucous membranes remain intact  Outcome: Progressing  Flowsheets (Taken 10/23/2024 2225)  Oral Mucous Membranes Remain Intact: Assess oral mucosa and hygiene practices     Problem: Musculoskeletal - Adult  Goal: Return mobility to safest level of function  Outcome: Progressing  Goal: Maintain proper alignment of affected body part  Outcome: Progressing  Goal: Return ADL status to a safe level of function  Outcome: Progressing     Problem: Gastrointestinal - Adult  Goal: Minimal or absence of nausea and vomiting  Outcome: Progressing  Flowsheets (Taken 10/23/2024 2225)  Minimal or absence of nausea and vomiting: Administer IV fluids as ordered to ensure adequate hydration  Goal: Maintains or returns to baseline bowel function  Outcome: Progressing  Flowsheets (Taken 10/23/2024 2225)  Maintains or returns to baseline bowel function: Assess bowel function  Goal: Maintains adequate nutritional intake  Outcome: Progressing  Flowsheets (Taken 10/23/2024 2225)  Maintains adequate nutritional intake: Monitor percentage of each meal consumed     Problem: Genitourinary - Adult  Goal: Absence of urinary retention  Outcome: Progressing  Flowsheets (Taken 10/23/2024 2225)  Absence of urinary retention: Assess patients ability to void and empty bladder     Problem: Infection - Adult  Goal: Absence of infection at discharge  Outcome: Progressing  Goal: Absence of infection  during hospitalization  Outcome: Progressing     Problem: Metabolic/Fluid and Electrolytes - Adult  Goal: Electrolytes maintained within normal limits  Outcome: Progressing  Goal: Hemodynamic stability and optimal renal function maintained  Outcome: Progressing  Goal: Glucose maintained within prescribed range  Outcome: Progressing     Problem: Hematologic - Adult  Goal: Maintains hematologic stability  Outcome: Progressing     Problem: Confusion  Goal: Confusion, delirium, dementia, or psychosis is improved or at baseline  Description: INTERVENTIONS:  1. Assess for possible contributors to thought disturbance, including medications, impaired vision or hearing, underlying metabolic abnormalities, dehydration, psychiatric diagnoses, and notify attending LIP  2. Institute high risk fall precautions, as indicated  3. Provide frequent short contacts to provide reality reorientation, refocusing and direction  4. Decrease environmental stimuli, including noise as appropriate  5. Monitor and intervene to maintain adequate nutrition, hydration, elimination, sleep and activity  6. If unable to ensure safety without constant attention obtain sitter and review sitter guidelines with assigned personnel  7. Initiate Psychosocial CNS and Spiritual Care consult, as indicated  Outcome: Progressing     Problem: Pain  Goal: Verbalizes/displays adequate comfort level or baseline comfort level  Outcome: Progressing     Problem: Safety - Medical Restraint  Goal: Remains free of injury from restraints (Restraint for Interference with Medical Device)  Description: INTERVENTIONS:  1. Determine that other, less restrictive measures have been tried or would not be effective before applying the restraint  2. Evaluate the patient's condition at the time of restraint application  3. Inform patient/family regarding the reason for restraint  4. Q2H: Monitor safety, psychosocial status, comfort, nutrition and hydration  Outcome: Progressing      Problem: ABCDS Injury Assessment  Goal: Absence of physical injury  Outcome: Progressing     "

## 2024-10-24 NOTE — Care Coordination (Signed)
"  Care Management Progress Note        Reason for Admission:   Hypoxemia [R09.02]  Severe sepsis (HCC) [A41.9, R65.20]  Community acquired pneumonia, unspecified laterality [J18.9]         Patient Admission Status: Inpatient  RUR: 17%  Hospitalization in the last 30 days (Readmission):  No        Transition of care plan:  Patient was discussed in IDR and continues to be medically managed. Patient is receiving some comfort measures. Palliative is following.     Dispo: CM is following. No CM consults received at this time.     Discharge plan communicated with patient and/or discharge caregiver: Yes      Date 1st IMM letter given: 10/15/24    Outpatient follow-up.    Transport at discharge: stretcher.           ___________________________________________   [name], RN Case Manager  10/24/2024   11:58 AM     "

## 2024-10-24 NOTE — Plan of Care (Signed)
"    Problem: Skin/Tissue Integrity  Goal: Skin integrity remains intact  Description: 1.  Monitor for areas of redness and/or skin breakdown  2.  Assess vascular access sites hourly  3.  Every 4-6 hours minimum:  Change oxygen saturation probe site  4.  Every 4-6 hours:  If on nasal continuous positive airway pressure, respiratory therapy assess nares and determine need for appliance change or resting period  10/24/2024 1628 by Rosalba Jansky, RN  Outcome: Progressing  Flowsheets (Taken 10/23/2024 2225 by Mannie Sober, RN)  Skin Integrity Remains Intact: Monitor for areas of redness and/or skin breakdown  10/24/2024 0502 by Mannie Sober, RN  Outcome: Progressing  Flowsheets (Taken 10/23/2024 2225)  Skin Integrity Remains Intact: Monitor for areas of redness and/or skin breakdown     Problem: Safety - Adult  Goal: Free from fall injury  10/24/2024 1628 by Rosalba Jansky, RN  Outcome: Progressing  Flowsheets (Taken 10/21/2024 1505)  Free From Fall Injury: Instruct family/caregiver on patient safety  10/24/2024 0502 by Mannie Sober, RN  Outcome: Progressing     "

## 2024-10-24 NOTE — Progress Notes (Signed)
 "    Hospitalist Progress Note      NAME:  Chelsea Schultz   DOB:  12/08/1925  MRM:  [id number]    Date/Time: 10/24/2024  6:28 PM           Assessment / Plan:     89 yo Hx of HTN, DM, interstitial lung dz, dementia, presented w/ AMS, resp failure, hypoxia, pulm fibrosis, enterococcus UTI     Present on Admission:   Severe sepsis (HCC)   Pulmonary fibrosis (HCC)   Acute hypoxemic respiratory failure (HCC)   Community acquired pneumonia   Delirium    # Acute hypoxic respiratory failure  Multifactorial etiology.  Likely combination of pulmonary fibrosis and aspiration pneumonia.  She was treated with IV Zosyn .  DC Zosyn  now that she is on comfort care.  Supplemental oxygen as needed.    # Aspiration pneumonia  # Enterococcus UTI  Imaging concerning for aspiration pneumonia.  MBS completed.  She remains at severe risk of aspiration, now that she is DNR/DNI and transitioning to comfort care, can allow pleasure feeds.  Maintain aspiration precautions.    # Acute metabolic encephalopathy  AMS multifactorial etiology.  Likely combination of underlying dementia and acute delirium.  On exam, she is oriented only to self.  No new focal deficits.  Delirium precautions using a combination of nonpharmacologic and pharmacologic strategies.  Nonpharmacologic interventions : Minimize nighttime interruptions, try to maintain normal sleep-wake cycle, optimize nutrition, frequently reorient.      Pharmacologic : Diazepam /Ativan  as needed.  Bedside sitter if needed.  Maintain fall precautions.    #ACP/goals of care  After extensive goals of care discussion with family, patient transition to comfort care by palliative team.  Appreciate palliative care assistance with this.    # Hypercalcemia  Suspect secondary to dehydration.  PTH within normal limits.    # Pleuritic chest pain  Suspect secondary to pneumonitis.  CTA chest showed no PE.  Follow clinically.    # Diabetes mellitus  A1c 7.0.  Stop Accu-Cheks.    # Hypertension  Stop    antihypertensives.      #BMI (Calculated): 19.98    I have personally reviewed the radiographs, laboratory data in Epic and decisions and statements above are based partially on this personal interpretation.                 Care Plan discussed with: Patient and Family    Discussed:  Code Status and Care Plan    Prophylaxis:  SCD's    Disposition:  Home w/Family           ___________________________________________________    Attending Physician: Debby JINNY Binder, MD        Subjective:     Chief Complaint: Denies any symptoms    ROS:    Patient was seen and examined at bedside.  Multiple family members present.  I personally updated the patient's daughter.  Patient is only minimally verbal.      Objective:       Vitals:          Last 24hrs VS reviewed since prior progress note. Most recent are:    Vitals:    10/24/24 0855   BP: (!) 163/63   Pulse: 62   Resp: 15   Temp: 98.1 F (36.7 C)   SpO2: 95%     SpO2 Readings from Last 6 Encounters:   10/24/24 95%   08/19/24 96%   06/26/23 97%   09/05/22 96%  06/19/22 96%   05/11/22 94%          Intake/Output Summary (Last 24 hours) at 10/24/2024 1828  Last data filed at 10/24/2024 0749  Gross per 24 hour   Intake --   Output 500 ml   Net -500 ml          Exam:     Physical Exam:    Gen: Chronically ill-appearing  HEENT:  Pink conjunctivae, PERRL, hearing intact to voice, moist mucous membranes  Neck:  Supple, without masses, thyroid non-tender  Resp:  No accessory muscle use, clear breath sounds without wheezes rales or rhonchi  Card:  No murmurs, normal S1, S2 without thrills, bruits or peripheral edema  Abd:  Soft, non-tender, non-distended, normoactive bowel sounds are present  Musc:  No cyanosis or clubbing  Skin:  No rashes or ulcers, skin turgor is good  Neuro: No focal deficits, unable to assess sensations, reflexes 2+  Psych: Oriented only to self    Medications Reviewed: (see below)    Lab Data Reviewed: (see  below)    ______________________________________________________________________    Medications:     Current Facility-Administered Medications   Medication Dose Route Frequency    glycopyrrolate  (ROBINUL ) IntraVENous 0.2 mg  0.2 mg IntraVENous Q4H PRN    ondansetron  (ZOFRAN ) injection 4 mg  4 mg IntraVENous Q6H PRN    LORazepam  (ATIVAN ) 2 MG/ML concentrated solution 0.5 mg  0.5 mg SubLINGual Q2H PRN    HYDROmorphone  HCl PF (DILAUDID ) injection 0.25 mg  0.25 mg IntraVENous Q15 Min PRN    diazePAM  (VALIUM ) injection 2.5 mg  2.5 mg IntraVENous Q30 Min PRN    oxyCODONE  (ROXICODONE ) 5 MG/5ML solution 2.5 mg  2.5 mg Oral Q2H PRN    benzocaine  (ORAJEL) 20 % mucosal gel   Mouth/Throat TID    acetaminophen  (TYLENOL ) suppository 650 mg  650 mg Rectal Q6H    hydrALAZINE  (APRESOLINE ) injection 10 mg  10 mg IntraVENous Q6H PRN    acetaminophen  (TYLENOL ) tablet 650 mg  650 mg Oral Q4H PRN    guaiFENesin  (ROBITUSSIN) 100 MG/5ML liquid 200 mg  200 mg Oral Q4H PRN    ipratropium 0.5 mg-albuterol  2.5 mg (DUONEB ) nebulizer solution 1 Dose  1 Dose Inhalation Q4H PRN    prochlorperazine  (COMPAZINE ) injection 10 mg  10 mg IntraVENous Q6H PRN    sodium chloride  flush 0.9 % injection 5-40 mL  5-40 mL IntraVENous PRN    0.9 % sodium chloride  infusion   IntraVENous PRN    albuterol  (PROVENTIL ) (2.5 MG/3ML) 0.083% nebulizer solution 2.5 mg  2.5 mg Nebulization Q4H PRN            Lab Review:     Recent Labs     10/22/24  0414   WBC 10.9   HGB 10.0*   HCT 33.0*   PLT 405*     Recent Labs     10/22/24  0414   NA 136   K 3.6   CL 100   CO2 30*   BUN 28*   MG 2.4*     No components found for: GLPOC        "

## 2024-10-24 NOTE — Progress Notes (Signed)
 "Palliative Medicine  Patient Name: Chelsea Schultz  Date of Birth: [birth year]  MRN: [id number]  Age: 89 y.o.  Gender: female    Date of Initial Consult: 10/20/24  Date of Service: 10/24/2024  Time: 1:09 PM  Provider: Franchot Dickens, APRN - [facility] Day: 15  Admit Date: 10/10/2024  Referring Provider:  Dr. Rosalea       Reasons for Consultation:  Other: goals of care, code status    HISTORY OF PRESENT ILLNESS (HPI):   Chelsea Schultz is a 89 y.o. female with a past medical history of interstitial lung disease, prior CVA (2013, 2016), DM, HTN, recurrent UTIs who was admitted on 10/10/2024 from home with productive cough, fatigue for ~1 week. Upon arrival to ED, noted to be febrile and hypoxic requiring supplemental oxygen. Initial chest imaging obtained in ED revealing diffuse interstitial prominence of ground glass opacities suggestive of pulmonary fibrosis vs vs pneumonitis vs pneumonia. Was started on empiric IV antibiotics and admitted for further workup, medical management. Course has been significant for UTI, hospital acquired delirium with waxing/waning mentation. Review indicates fluctuating agitation causing her to self remove supplemental oxygen causing hypoxia, which has prompted several RRTs. Also with increasing lethargy, overall decline, concerns of aspiration with progressive dysphagia. Plan for MBS today. Palliative consulted to assist with goals.       Psychosocial: Six children - 5 living children to include four daughters and one son. Lives with daughter Chelsea Schultz, pt has resided with Chelsea Schultz since 2011. Prior, lived with daughter Chelsea Schultz in [place]. Chelsea Schultz is primary caregiver, does work full time hours at the [employer]. Additionally family has put in place private caregiver M-F 9-5, and 3 evenings a week from 5-9.     Recent baseline prior to admission: obtained from daughters   -Cognitively, able to recognize family members, able to carry on discussion/conversation with daughters and caregivers. Frequently has  disorientation in the evenings where she can become tearful/emotional but often redirectable.   -Functionally, able to stand/pivot and participate in some of her care albeit essentially reliant on all ADLs including bathing, toileting, dressing. Requires meal preparation.     Pt seen today at bedside prior to family meeting with four daughters. Currently resting in bed, on 3L NC. No distress, is confused but redirectable without overt agitation.    -12/30: Seen mid afternoon, dtr Chelsea Schultz at bedside but no other family present at time of visit. Chelsea Schultz was present earlier, Chelsea Schultz coming in around ~5:00-5:30 PM today. Unfortunately with significant decline since this morning. She is much less responsive, does not open eyes to voice. Suspected aspiration event thus reverted back to NPO.   -12/31: Seen midmorning, dtr Chelsea Schultz and dtr Chelsea Schultz at bedside. No significant changes from yesterday as pt remains minimally responsive, family reports she had finally settled after generalized discomfort this morning so did not wake per request. Remains on 2L NC but no distress. No prn medication requirements.   -1/2: Seen midmorning, dtr Chelsea Schultz and Chelsea Schultz present. Pt resting comfortably on 2L NC. No notable symptoms, appears comfortable. Family states she slept through well through the evening. Feels as if Tylenol  suppositories are helping generalized pain.     PALLIATIVE DIAGNOSES:    Acute hypoxic respiratory failure; multifactorial  Delirium superimposed on baseline dementia  Dysphagia  High risk aspiration   Physical debility, generalized weakness   Goals of care  Advance care planning   Code status discussion     ASSESSMENT AND PLAN:   Chart reviewed prior  to seeing pt; including notes, labs, imaging.   Interval events reviewed and discussed with bedside nursing team, [facility] goals --   See prior discussions 12/29, 12/30, 12/31   Met with family as planned to discuss timing on comfort measures - see prior palliative notes as  above. Met with dtrs Chelsea Schultz and Chelsea Schultz in person, as well as Chelsea Schultz and Chelsea Schultz join via phone.   Reiterate comfort measures and philosophy of shifting gears to focus on comfort and symptom management as she transitions towards end of life. We discuss comfort measures in alignment with peeling back on IV fluids, IV antibiotics, IV steroids, and current medical management in place not contributory towards quality of life.   Family is starting to Schultz signs as previously discussed of volume overload with notable third spacing. They express understanding of goals of comfort and remain clear that they continue to wish to pursue comfort measures.   Discuss next steps of official comfort measures, monitoring her symptoms closely. Explain where hospice can take place whether in the hospital vs home vs in facility. We review criteria for inpatient hospice to include requirements of IV medications and inability to tolerate small volume oral/sublingual medications, as well as shorter prognosis towards end of life.   Family in agreement to transition to comfort measures - will stop IV fluids, IV antibiotics, and have comfort medications in place. Discuss both options of IV and sublingual to have on board whilst monitoring her symptoms and focusing on optimizing comfort.   Goals/summary: transition to comfort measures, plan to bring hospice in to evaluate over next 1-2 days to see her symptom requirements, whether she meets GIP vs exploring facility placement with hospice.   Code status --   DNR/DNI  ACP --   Pt adult children as LNOK - all work together in decision making   Symptoms -- will have both oral/sublingual and IV options as needed  Anxiety/restlessness/agitation:   Ativan  0.5 mg oral solution every 2 hours prn   Valium  2.5 mg IV every 30 minutes prn   Nonverbal signs of pain:   APAP suppository 650 mg every six hours scheduled per family request - as long as pt able to tolerate turning for PR administration. OK for  family/pt to refuse.   Oxycodone  2.5 mg oral solution every 2 hours prn   Dilaudid  0.25 mg IV every 15 minutes prn   All other comfort medications as ordered   Initial consult note routed to primary continuity provider and/or primary health care team members  Discussed and updated Dr. Iris and Earnie RN  Please call with any palliative questions or concerns.  Palliative Care Team is available via perfect serve or via phone.    Referrals to:   []  Outpatient Palliative Care  []  Home Based Palliative Care  []  Home Based Primary Care  []  Hospice       ADVANCE CARE PLANNING:   []  The Pall Med Interdisciplinary Team has updated the ACP Navigator with Health Care Decision Maker and Patient Capacity      Primary Decision Maker: Chelsea Schultz, Chelsea Schultz - Child [phone]    Primary Decision Maker: Chelsea Schultz - Child [phone]  Confirm Advance Directive: None  Patient Would Like to Complete Advance Directive: Unable    Current Code Status: DNR     Goals of Care: Goals of Care and Interventions  Patient/Health Care Proxy Stated Goals: Recovery from acute illness  Medical Interventions: Full interventions  Life Goals  Patient and Family  Personal Goals: continue current measures, hopeful for recovery    Please refer to Palliative Medicine ACP notes for further details.    PALLIATIVE ASSESSMENT:      Palliative Performance Scale (PPS):  PPS: 30    ECOG:        Modified ESAS:  Modified-[place] Symptom Assessment Scale (ESAS)  Pain Score: No pain  Dyspnea Score: No shortness of breath    Clinical Pain Assessment (nonverbal scale for severity on nonverbal patients):   Clinical Pain Assessment  Severity: 0  Location: UTA  Character: UTA  Duration: UTA  Factors: UTA  Frequency: UTA       NVPS:  Adult Nonverbal Pain Scale (NVPS)  Face: Occasional grimace, tearing, frowning, wrinkled forehead  Activity (Movement): Seeks attention through movement, slow cautious movements  Guarding: Splinting areas of the body,  tense  Physiology (Vital Signs): Stable vital signs  Respiratory: Baseline RR/SpO2 compliant with ventilator  NVPS Score : 3    RDOS:  RDOS  Heart rate per minute: less than 90  Respiratory Rate per Minute: less than 19  Restlessness:non-purposeful: None  Paradoxical breathing pattern:abdomen moves in on inspiration: None  Accessory muscle use: rise in clavicle during inspiration: None  Grunting at end-expiration: guttural sound: None  Nasal flaring: involuntary movement of nares: None  Total : 0      Vital Signs: Blood pressure (!) 163/63, pulse 62, temperature 98.1 F (36.7 C), temperature source Oral, resp. rate 15, height 1.499 m (4' 11.02), weight 44.9 kg (98 lb 15.8 oz), SpO2 95%.    PHYSICAL ASSESSMENT:   General: []  Oriented x3  []  Well appearing  []  Intubated  [x] Ill appearing  [] Other: minimally responsive   Mental Status: []  Normal mental status exam  []  Drowsy  []  Confused  [] Other:  Cardiovascular: []  Regular rate/rhythm  []  Arrhythmia  [x]  Other: minimally responsive   Chest: [x]  Effort normal  [] Lungs clear  []  Respiratory distress  [] Tachypnea  []  Other: 2L NC  Abdomen: []  Soft/non-tender  []  Normal appearance  []  Distended  []  Ascites  []  Other:  Neurological: []  Normal speech  []  Normal sensation  [x] Deficits present: minimally responsive, unable to follow commands, does not open eyes to voice   Extremity: []  Normal skin color/temp  []  Clubbing/cyanosis  []  No edema  [x]  Other: debilitated appearing, temporal muscle wasting noted     Wt Readings from Last 15 Encounters:   10/17/24 44.9 kg (98 lb 15.8 oz)   06/26/23 40.8 kg (90 lb)   06/19/22 49.9 kg (110 lb)   02/24/22 48.8 kg (107 lb 9.6 oz)   12/29/21 43.5 kg (96 lb)        Current Diet: ADULT DIET; Dysphagia - Pureed       PSYCHOSOCIAL/SPIRITUAL SCREENING:   Palliative IDT has assessed this patient for cultural preferences / practices and a referral made as appropriate to needs Gaffer, Patient Advocacy, Ethics, etc.)    Spiritual  Affiliation: Non-Denominational    Any spiritual / religious concerns:  []  Yes /  [x]  No   If Yes to discuss with pastoral care during IDT     Does caregiver feel burdened by caring for their loved one:   []  Yes /  [x]  No /  []  No Caregiver Present/Available []  No Caregiver []  Pt Lives at Facility  If Yes to discuss with social work during IDT    Anticipatory grief assessment:   [x]  Normal  / []  Maladaptive     If  Maladaptive to discuss with social work during IDT    ESAS Anxiety:      ESAS Depression:          LAB AND IMAGING FINDINGS:   Objective data reviewed:  labs, images, records, medication use, vitals, and chart     FINAL COMMENTS   Thank you for allowing Palliative Medicine to participate in the care of Chelsea VEAR Schultz.    Only check if applicable and billing time based rather than MDM  []  The total encounter time on this service date was __35__ minutes which was spent performing a face-to-face encounter and personally completing the provider-level activities documented in the note. This includes time spent prior to the visit and after the visit in direct care of the patient. This time does not include time spent in any separately reportable services.    Electronically signed by   Franchot Dickens, APRN - CNP  Palliative Care Team  on 10/24/2024 at 1:09 PM      "

## 2024-10-24 NOTE — Progress Notes (Signed)
"  Patient has transitioned to comfort measures only  Will sign off at this time  "

## 2024-10-25 LAB — LEGIONELLA ANTIGEN, URINE: L pneumophila S1 Ag, Ur: NEGATIVE

## 2024-10-25 MED ORDER — NYSTATIN 100000 UNIT/ML MT SUSP
100000 | Freq: Four times a day (QID) | OROMUCOSAL | Status: DC
Start: 2024-10-25 — End: 2024-11-02
  Administered 2024-10-25 – 2024-11-01 (×12): 500000 mL via ORAL

## 2024-10-25 MED ORDER — LIDOCAINE 5 % EX OINT
5 | CUTANEOUS | Status: DC | PRN
Start: 2024-10-25 — End: 2024-11-03
  Administered 2024-10-26 – 2024-10-27 (×2): via TOPICAL

## 2024-10-25 MED ORDER — FLUCONAZOLE IN SODIUM CHLORIDE 200-0.9 MG/100ML-% IV SOLN
Freq: Once | INTRAVENOUS | Status: AC
Start: 2024-10-25 — End: 2024-10-25
  Administered 2024-10-25: 19:00:00 200 mg via INTRAVENOUS

## 2024-10-25 MED FILL — LIDOCAINE 5 % EX OINT: 5 % | CUTANEOUS | Qty: 35.44 | Fill #0

## 2024-10-25 MED FILL — ACETAMINOPHEN 650 MG RE SUPP: 650 mg | RECTAL | Qty: 1 | Fill #0

## 2024-10-25 MED FILL — NYSTATIN 100000 UNIT/ML MT SUSP: 100000 [IU]/mL | OROMUCOSAL | Qty: 5 | Fill #0

## 2024-10-25 MED FILL — FLUCONAZOLE IN SODIUM CHLORIDE 200-0.9 MG/100ML-% IV SOLN: INTRAVENOUS | Qty: 100 | Fill #0

## 2024-10-25 MED FILL — DIAZEPAM 5 MG/ML IJ SOLN: 5 mg/mL | INTRAMUSCULAR | Qty: 2 | Fill #0

## 2024-10-25 NOTE — Plan of Care (Signed)
"    Problem: Skin/Tissue Integrity  Goal: Skin integrity remains intact  Description: 1.  Monitor for areas of redness and/or skin breakdown  2.  Assess vascular access sites hourly  3.  Every 4-6 hours minimum:  Change oxygen saturation probe site  4.  Every 4-6 hours:  If on nasal continuous positive airway pressure, respiratory therapy assess nares and determine need for appliance change or resting period  10/25/2024 1722 by Mirpuristokes, Seabron, RN  Outcome: Progressing     Problem: Safety - Adult  Goal: Free from fall injury  10/25/2024 1722 by Mirpuristokes, Seabron, RN  Outcome: Progressing     Problem: Chronic Conditions and Co-morbidities  Goal: Patient's chronic conditions and co-morbidity symptoms are monitored and maintained or improved  Outcome: Progressing     Problem: Discharge Planning  Goal: Discharge to home or other facility with appropriate resources  Outcome: Progressing     Problem: Respiratory - Adult  Goal: Achieves optimal ventilation and oxygenation  10/25/2024 1722 by Mirpuristokes, Seabron, RN  Outcome: Progressing     Problem: Pain  Goal: Verbalizes/displays adequate comfort level or baseline comfort level  10/25/2024 1722 by Mirpuristokes, Seabron, RN  Outcome: Progressing     "

## 2024-10-25 NOTE — Plan of Care (Signed)
"    Problem: Skin/Tissue Integrity  Goal: Skin integrity remains intact  Description: 1.  Monitor for areas of redness and/or skin breakdown  2.  Assess vascular access sites hourly  3.  Every 4-6 hours minimum:  Change oxygen saturation probe site  4.  Every 4-6 hours:  If on nasal continuous positive airway pressure, respiratory therapy assess nares and determine need for appliance change or resting period  10/25/2024 0412 by Mabel Bracken, RN  Outcome: Progressing  10/24/2024 1628 by Rosalba Jansky, RN  Outcome: Progressing  Flowsheets (Taken 10/23/2024 2225 by Mannie Sober, RN)  Skin Integrity Remains Intact: Monitor for areas of redness and/or skin breakdown     Problem: Safety - Adult  Goal: Free from fall injury  10/25/2024 0412 by Mabel Bracken, RN  Outcome: Progressing  10/24/2024 1628 by Rosalba Jansky, RN  Outcome: Progressing  Flowsheets (Taken 10/21/2024 1505)  Free From Fall Injury: Instruct family/caregiver on patient safety     Problem: Respiratory - Adult  Goal: Achieves optimal ventilation and oxygenation  Outcome: Progressing     Problem: Pain  Goal: Verbalizes/displays adequate comfort level or baseline comfort level  Outcome: Progressing     Problem: Skin/Tissue Integrity - Adult  Goal: Skin integrity remains intact  Description: 1.  Monitor for areas of redness and/or skin breakdown  2.  Assess vascular access sites hourly  3.  Every 4-6 hours minimum:  Change oxygen saturation probe site  4.  Every 4-6 hours:  If on nasal continuous positive airway pressure, respiratory therapy assess nares and determine need for appliance change or resting period  10/25/2024 0412 by Mabel Bracken, RN  Outcome: Progressing  10/24/2024 1628 by Rosalba Jansky, RN  Outcome: Progressing  Flowsheets (Taken 10/23/2024 2225 by Mannie Sober, RN)  Skin Integrity Remains Intact: Monitor for areas of redness and/or skin breakdown     "

## 2024-10-25 NOTE — Progress Notes (Signed)
 "    Hospitalist Progress Note      NAME:  Chelsea Schultz   DOB:  02/06/1926  MRM:  [id number]    Date/Time: 10/25/2024  4:38 PM           Assessment / Plan:     89 yo Hx of HTN, DM, interstitial lung dz, dementia, presented w/ AMS, resp failure, hypoxia, pulm fibrosis, enterococcus UTI     Present on Admission:   Severe sepsis (HCC)   Pulmonary fibrosis (HCC)   Acute hypoxemic respiratory failure (HCC)   Community acquired pneumonia   Delirium    # Acute hypoxic respiratory failure  Multifactorial etiology.  Likely combination of pulmonary fibrosis and aspiration pneumonia.  She was treated with IV Zosyn .  DC Zosyn  now that she is on comfort care.  Supplemental oxygen as needed.    # Aspiration pneumonia  # Enterococcus UTI  Imaging concerning for aspiration pneumonia.  MBS completed.  She remains at severe risk of aspiration, now that she is DNR/DNI and transitioning to comfort care, can allow pleasure feeds.  Maintain aspiration precautions.    # Acute metabolic encephalopathy  AMS multifactorial etiology.  Likely combination of underlying dementia and acute delirium.  On exam, she is oriented only to self.  No new focal deficits.  Delirium precautions using a combination of nonpharmacologic and pharmacologic strategies.  Nonpharmacologic interventions : Minimize nighttime interruptions, try to maintain normal sleep-wake cycle, optimize nutrition, frequently reorient.      Pharmacologic : Diazepam /Ativan  as needed.  Bedside sitter if needed.  Maintain fall precautions.    #ACP/goals of care  After extensive goals of care discussion with family, patient transition to comfort care by palliative team.  Appreciate palliative care assistance with this.    # Oral candidiasis  On exam, white plaques seen on buccal mucosa, palate and tongue concerning for mild oral candidiasis.  Can use nystatin .    # Hypercalcemia  Suspect secondary to dehydration.  PTH within normal limits.    # Pleuritic chest pain  Suspect secondary  to pneumonitis.  CTA chest showed no PE.  Follow clinically.    # Diabetes mellitus  A1c 7.0.  Stop Accu-Cheks.    # Hypertension  Stop   antihypertensives.      #BMI (Calculated): 19.98    I have personally reviewed the radiographs, laboratory data in Epic and decisions and statements above are based partially on this personal interpretation.                 Care Plan discussed with: Patient and Family    Discussed:  Code Status and Care Plan    Prophylaxis:  SCD's    Disposition:  Home w/Family           ___________________________________________________    Attending Physician: Debby JINNY Binder, MD        Subjective:     Chief Complaint: Denies any symptoms    ROS:    Patient was seen and examined at bedside.  Multiple family members present.  She is more awake today.  Case discussed with RN.     Objective:       Vitals:          Last 24hrs VS reviewed since prior progress note. Most recent are:    Vitals:    10/25/24 0832   BP: (!) 141/54   Pulse: 70   Resp: 14   Temp: 98.6 F (37 C)   SpO2: 91%  SpO2 Readings from Last 6 Encounters:   10/25/24 91%   08/19/24 96%   06/26/23 97%   09/05/22 96%   06/19/22 96%   05/11/22 94%        No intake or output data in the 24 hours ending 10/25/24 1638         Exam:     Physical Exam:    Gen: Chronically ill-appearing  HEENT: Dry mucous membrane, white plaque seen on buccal mucosa  Neck:  Supple, without masses, thyroid non-tender  Resp:  No accessory muscle use, clear breath sounds without wheezes rales or rhonchi  Card:  No murmurs, normal S1, S2 without thrills, bruits or peripheral edema  Abd:  Soft, non-tender, non-distended, normoactive bowel sounds are present  Musc:  No cyanosis or clubbing  Skin:  No rashes or ulcers, skin turgor is good  Neuro: No focal deficits, unable to assess sensations, reflexes 2+  Psych: Oriented only to self    Medications Reviewed: (see below)    Lab Data Reviewed: (see  below)    ______________________________________________________________________    Medications:     Current Facility-Administered Medications   Medication Dose Route Frequency    lidocaine  (XYLOCAINE ) 5 % ointment   Topical PRN    nystatin  (MYCOSTATIN ) 100000 UNIT/ML suspension 500,000 Units  5 mL Oral 4x Daily    glycopyrrolate  (ROBINUL ) IntraVENous 0.2 mg  0.2 mg IntraVENous Q4H PRN    ondansetron  (ZOFRAN ) injection 4 mg  4 mg IntraVENous Q6H PRN    LORazepam  (ATIVAN ) 2 MG/ML concentrated solution 0.5 mg  0.5 mg SubLINGual Q2H PRN    HYDROmorphone  HCl PF (DILAUDID ) injection 0.25 mg  0.25 mg IntraVENous Q15 Min PRN    diazePAM  (VALIUM ) injection 2.5 mg  2.5 mg IntraVENous Q30 Min PRN    oxyCODONE  (ROXICODONE ) 5 MG/5ML solution 2.5 mg  2.5 mg Oral Q2H PRN    benzocaine  (ORAJEL) 20 % mucosal gel   Mouth/Throat TID    acetaminophen  (TYLENOL ) suppository 650 mg  650 mg Rectal Q6H    hydrALAZINE  (APRESOLINE ) injection 10 mg  10 mg IntraVENous Q6H PRN    acetaminophen  (TYLENOL ) tablet 650 mg  650 mg Oral Q4H PRN    guaiFENesin  (ROBITUSSIN) 100 MG/5ML liquid 200 mg  200 mg Oral Q4H PRN    ipratropium 0.5 mg-albuterol  2.5 mg (DUONEB ) nebulizer solution 1 Dose  1 Dose Inhalation Q4H PRN    prochlorperazine  (COMPAZINE ) injection 10 mg  10 mg IntraVENous Q6H PRN    sodium chloride  flush 0.9 % injection 5-40 mL  5-40 mL IntraVENous PRN    0.9 % sodium chloride  infusion   IntraVENous PRN    albuterol  (PROVENTIL ) (2.5 MG/3ML) 0.083% nebulizer solution 2.5 mg  2.5 mg Nebulization Q4H PRN            Lab Review:     No results for input(s): WBC, HGB, HCT, PLT in the last 72 hours.    No results for input(s): NA, K, CL, CO2, GLU, BUN, MG, PHOS, ALT, INR in the last 72 hours.    Invalid input(s): CREA, CA, ALB, TBIL, SGOT    No components found for: GLPOC        "

## 2024-10-26 LAB — S. PNEUMONIA ANTIGEN, URINE/CSF: S pneumoniae Ag, CSF: NEGATIVE

## 2024-10-26 MED ORDER — MICONAZOLE NITRATE 2 % VA CREA
2 | Freq: Every evening | VAGINAL | Status: AC
Start: 2024-10-26 — End: 2024-11-01
  Administered 2024-10-26 – 2024-11-01 (×5): 1 via VAGINAL

## 2024-10-26 MED ORDER — HYDROCORTISONE (PERIANAL) 2.5 % EX CREA
2.5 | Freq: Three times a day (TID) | CUTANEOUS | Status: DC
Start: 2024-10-26 — End: 2024-11-03
  Administered 2024-10-26 – 2024-11-03 (×19): via RECTAL

## 2024-10-26 MED ORDER — CARBOXYMETHYLCELLULOSE SOD PF 1 % OP GEL
1 | Freq: Three times a day (TID) | OPHTHALMIC | Status: DC
Start: 2024-10-26 — End: 2024-11-03
  Administered 2024-10-27 – 2024-11-03 (×19): 1 [drp] via OPHTHALMIC

## 2024-10-26 MED FILL — REFRESH CELLUVISC 1 % OP GEL: 1 % | OPHTHALMIC | Qty: 0.13 | Fill #0

## 2024-10-26 MED FILL — NYSTATIN 100000 UNIT/ML MT SUSP: 100000 [IU]/mL | OROMUCOSAL | Qty: 5 | Fill #0

## 2024-10-26 MED FILL — ACETAMINOPHEN 650 MG RE SUPP: 650 mg | RECTAL | Qty: 1 | Fill #0

## 2024-10-26 MED FILL — LORAZEPAM 2 MG/ML PO CONC: 2 mg/mL | ORAL | Qty: 0.5 | Fill #0

## 2024-10-26 MED FILL — GLYCOPYRROLATE 0.2 MG/ML IJ SOLN: 0.2 mg/mL | INTRAMUSCULAR | Qty: 1 | Fill #0

## 2024-10-26 MED FILL — PROCTO-MED HC 2.5 % EX CREA: 2.5 % | CUTANEOUS | Qty: 28 | Fill #0

## 2024-10-26 MED FILL — MICONAZOLE 7 2 % VA CREA: 2 % | VAGINAL | Qty: 45 | Fill #0

## 2024-10-26 NOTE — Plan of Care (Signed)
"    Problem: Skin/Tissue Integrity  Goal: Skin integrity remains intact  Description: 1.  Monitor for areas of redness and/or skin breakdown  2.  Assess vascular access sites hourly  3.  Every 4-6 hours minimum:  Change oxygen saturation probe site  4.  Every 4-6 hours:  If on nasal continuous positive airway pressure, respiratory therapy assess nares and determine need for appliance change or resting period  10/26/2024 0059 by Deatrice Dickens, RN  Outcome: Progressing  10/25/2024 1722 by Phyllis Scarpa, RN  Outcome: Progressing     Problem: Safety - Adult  Goal: Free from fall injury  10/26/2024 0059 by Deatrice Dickens, RN  Outcome: Progressing  10/25/2024 1722 by Phyllis Scarpa, RN  Outcome: Progressing     Problem: Chronic Conditions and Co-morbidities  Goal: Patient's chronic conditions and co-morbidity symptoms are monitored and maintained or improved  10/26/2024 0059 by Deatrice Dickens, RN  Outcome: Progressing  10/25/2024 1722 by Phyllis Scarpa, RN  Outcome: Progressing     "

## 2024-10-26 NOTE — Plan of Care (Signed)
 "  Problem: Skin/Tissue Integrity  Goal: Skin integrity remains intact  Description: 1.  Monitor for areas of redness and/or skin breakdown  2.  Assess vascular access sites hourly  3.  Every 4-6 hours minimum:  Change oxygen saturation probe site  4.  Every 4-6 hours:  If on nasal continuous positive airway pressure, respiratory therapy assess nares and determine need for appliance change or resting period  10/26/2024 1059 by Kriste Browning, LPN  Outcome: Progressing  10/26/2024 0059 by Deatrice Dickens, RN  Outcome: Progressing     Problem: Safety - Adult  Goal: Free from fall injury  10/26/2024 1059 by Kriste Browning, LPN  Outcome: Progressing  10/26/2024 0059 by Deatrice Dickens, RN  Outcome: Progressing     Problem: Chronic Conditions and Co-morbidities  Goal: Patient's chronic conditions and co-morbidity symptoms are monitored and maintained or improved  10/26/2024 1059 by Kriste Browning, LPN  Outcome: Progressing  10/26/2024 0059 by Deatrice Dickens, RN  Outcome: Progressing     Problem: Discharge Planning  Goal: Discharge to home or other facility with appropriate resources  10/26/2024 1059 by Kriste Browning, LPN  Outcome: Progressing  10/26/2024 0059 by Deatrice Dickens, RN  Outcome: Progressing     Problem: Respiratory - Adult  Goal: Achieves optimal ventilation and oxygenation  10/26/2024 1059 by Kriste Browning, LPN  Outcome: Progressing  10/26/2024 0059 by Deatrice Dickens, RN  Outcome: Progressing     Problem: Neurosensory - Adult  Goal: Achieves stable or improved neurological status  10/26/2024 1059 by Kriste Browning, LPN  Outcome: Progressing  10/26/2024 0059 by Deatrice Dickens, RN  Outcome: Progressing  Goal: Achieves maximal functionality and self care  10/26/2024 1059 by Kriste Browning, LPN  Outcome: Progressing  10/26/2024 0059 by Deatrice Dickens, RN  Outcome: Progressing     Problem: Cardiovascular - Adult  Goal: Maintains optimal cardiac output and hemodynamic stability  10/26/2024 1059 by Kriste Browning, LPN  Outcome:  Progressing  10/26/2024 0059 by Deatrice Dickens, RN  Outcome: Progressing  Goal: Absence of cardiac dysrhythmias or at baseline  10/26/2024 1059 by Kriste Browning, LPN  Outcome: Progressing  10/26/2024 0059 by Deatrice Dickens, RN  Outcome: Progressing     Problem: Skin/Tissue Integrity - Adult  Goal: Skin integrity remains intact  Description: 1.  Monitor for areas of redness and/or skin breakdown  2.  Assess vascular access sites hourly  3.  Every 4-6 hours minimum:  Change oxygen saturation probe site  4.  Every 4-6 hours:  If on nasal continuous positive airway pressure, respiratory therapy assess nares and determine need for appliance change or resting period  10/26/2024 1059 by Kriste Browning, LPN  Outcome: Progressing  10/26/2024 0059 by Deatrice Dickens, RN  Outcome: Progressing  Goal: Incisions, wounds, or drain sites healing without S/S of infection  10/26/2024 1059 by Kriste Browning, LPN  Outcome: Progressing  10/26/2024 0059 by Deatrice Dickens, RN  Outcome: Progressing  Goal: Oral mucous membranes remain intact  10/26/2024 1059 by Kriste Browning, LPN  Outcome: Progressing  10/26/2024 0059 by Deatrice Dickens, RN  Outcome: Progressing     Problem: Musculoskeletal - Adult  Goal: Return mobility to safest level of function  10/26/2024 1059 by Kriste Browning, LPN  Outcome: Progressing  10/26/2024 0059 by Deatrice Dickens, RN  Outcome: Progressing  Goal: Maintain proper alignment of affected body part  10/26/2024 1059 by Kriste Browning, LPN  Outcome: Progressing  10/26/2024 0059 by Deatrice Dickens, RN  Outcome: Progressing  Goal: Return ADL status to a safe  level of function  10/26/2024 1059 by Kriste Browning, LPN  Outcome: Progressing  10/26/2024 0059 by Deatrice Dickens, RN  Outcome: Progressing     Problem: Gastrointestinal - Adult  Goal: Minimal or absence of nausea and vomiting  10/26/2024 1059 by Kriste Browning, LPN  Outcome: Progressing  10/26/2024 0059 by Deatrice Dickens, RN  Outcome: Progressing  Goal: Maintains or returns to baseline bowel  function  10/26/2024 1059 by Kriste Browning, LPN  Outcome: Progressing  10/26/2024 0059 by Deatrice Dickens, RN  Outcome: Progressing  Goal: Maintains adequate nutritional intake  10/26/2024 1059 by Kriste Browning, LPN  Outcome: Progressing  10/26/2024 0059 by Deatrice Dickens, RN  Outcome: Progressing     Problem: Genitourinary - Adult  Goal: Absence of urinary retention  10/26/2024 1059 by Kriste Browning, LPN  Outcome: Progressing  10/26/2024 0059 by Deatrice Dickens, RN  Outcome: Progressing     Problem: Infection - Adult  Goal: Absence of infection at discharge  10/26/2024 1059 by Kriste Browning, LPN  Outcome: Progressing  10/26/2024 0059 by Deatrice Dickens, RN  Outcome: Progressing  Goal: Absence of infection during hospitalization  10/26/2024 1059 by Kriste Browning, LPN  Outcome: Progressing  10/26/2024 0059 by Deatrice Dickens, RN  Outcome: Progressing     Problem: Metabolic/Fluid and Electrolytes - Adult  Goal: Electrolytes maintained within normal limits  10/26/2024 1059 by Kriste Browning, LPN  Outcome: Progressing  10/26/2024 0059 by Deatrice Dickens, RN  Outcome: Progressing  Goal: Hemodynamic stability and optimal renal function maintained  10/26/2024 1059 by Kriste Browning, LPN  Outcome: Progressing  10/26/2024 0059 by Deatrice Dickens, RN  Outcome: Progressing  Goal: Glucose maintained within prescribed range  10/26/2024 1059 by Kriste Browning, LPN  Outcome: Progressing  10/26/2024 0059 by Deatrice Dickens, RN  Outcome: Progressing     Problem: Hematologic - Adult  Goal: Maintains hematologic stability  10/26/2024 1059 by Kriste Browning, LPN  Outcome: Progressing  10/26/2024 0059 by Deatrice Dickens, RN  Outcome: Progressing     Problem: Confusion  Goal: Confusion, delirium, dementia, or psychosis is improved or at baseline  Description: INTERVENTIONS:  1. Assess for possible contributors to thought disturbance, including medications, impaired vision or hearing, underlying metabolic abnormalities, dehydration, psychiatric diagnoses, and notify  attending LIP  2. Institute high risk fall precautions, as indicated  3. Provide frequent short contacts to provide reality reorientation, refocusing and direction  4. Decrease environmental stimuli, including noise as appropriate  5. Monitor and intervene to maintain adequate nutrition, hydration, elimination, sleep and activity  6. If unable to ensure safety without constant attention obtain sitter and review sitter guidelines with assigned personnel  7. Initiate Psychosocial CNS and Spiritual Care consult, as indicated  10/26/2024 1059 by Kriste Browning, LPN  Outcome: Progressing  10/26/2024 0059 by Deatrice Dickens, RN  Outcome: Progressing     Problem: Pain  Goal: Verbalizes/displays adequate comfort level or baseline comfort level  10/26/2024 1059 by Kriste Browning, LPN  Outcome: Progressing  10/26/2024 0059 by Deatrice Dickens, RN  Outcome: Progressing     Problem: Safety - Medical Restraint  Goal: Remains free of injury from restraints (Restraint for Interference with Medical Device)  Description: INTERVENTIONS:  1. Determine that other, less restrictive measures have been tried or would not be effective before applying the restraint  2. Evaluate the patient's condition at the time of restraint application  3. Inform patient/family regarding the reason for restraint  4. Q2H: Monitor safety, psychosocial status, comfort, nutrition and hydration  10/26/2024 1059 by Kriste Browning,  LPN  Outcome: Progressing  10/26/2024 0059 by Deatrice Dickens, RN  Outcome: Progressing     Problem: ABCDS Injury Assessment  Goal: Absence of physical injury  10/26/2024 1059 by Kriste Browning, LPN  Outcome: Progressing  10/26/2024 0059 by Deatrice Dickens, RN  Outcome: Progressing     "

## 2024-10-26 NOTE — Plan of Care (Signed)
 "  Problem: Skin/Tissue Integrity  Goal: Skin integrity remains intact  Description: 1.  Monitor for areas of redness and/or skin breakdown  2.  Assess vascular access sites hourly  3.  Every 4-6 hours minimum:  Change oxygen saturation probe site  4.  Every 4-6 hours:  If on nasal continuous positive airway pressure, respiratory therapy assess nares and determine need for appliance change or resting period  10/26/2024 2313 by Franchot Gong, RN  Outcome: Progressing  Flowsheets (Taken 10/26/2024 2033)  Skin Integrity Remains Intact:   Monitor for areas of redness and/or skin breakdown   Assess vascular access sites hourly  10/26/2024 1059 by Kriste Browning, LPN  Outcome: Progressing     Problem: Safety - Adult  Goal: Free from fall injury  10/26/2024 2313 by Franchot Gong, RN  Outcome: Progressing  10/26/2024 1059 by Kriste Browning, LPN  Outcome: Progressing     Problem: Chronic Conditions and Co-morbidities  Goal: Patient's chronic conditions and co-morbidity symptoms are monitored and maintained or improved  10/26/2024 2313 by Franchot Gong, RN  Outcome: Progressing  Flowsheets (Taken 10/26/2024 2033)  Care Plan - Patient's Chronic Conditions and Co-Morbidity Symptoms are Monitored and Maintained or Improved:   Monitor and assess patient's chronic conditions and comorbid symptoms for stability, deterioration, or improvement   Collaborate with multidisciplinary team to address chronic and comorbid conditions and prevent exacerbation or deterioration   Update acute care plan with appropriate goals if chronic or comorbid symptoms are exacerbated and prevent overall improvement and discharge  10/26/2024 1059 by Kriste Browning, LPN  Outcome: Progressing     Problem: Discharge Planning  Goal: Discharge to home or other facility with appropriate resources  10/26/2024 2313 by Franchot Gong, RN  Outcome: Progressing  Flowsheets (Taken 10/26/2024 2033)  Discharge to home or other facility with appropriate resources:   Identify barriers  to discharge with patient and caregiver   Arrange for needed discharge resources and transportation as appropriate  10/26/2024 1059 by Kriste Browning, LPN  Outcome: Progressing     Problem: Respiratory - Adult  Goal: Achieves optimal ventilation and oxygenation  10/26/2024 2313 by Franchot Gong, RN  Outcome: Progressing  Flowsheets (Taken 10/26/2024 2033)  Achieves optimal ventilation and oxygenation:   Assess for changes in respiratory status   Assess for changes in mentation and behavior   Position to facilitate oxygenation and minimize respiratory effort   Oxygen supplementation based on oxygen saturation or arterial blood gases  10/26/2024 1059 by Kriste Browning, LPN  Outcome: Progressing     Problem: Confusion  Goal: Confusion, delirium, dementia, or psychosis is improved or at baseline  Description: INTERVENTIONS:  1. Assess for possible contributors to thought disturbance, including medications, impaired vision or hearing, underlying metabolic abnormalities, dehydration, psychiatric diagnoses, and notify attending LIP  2. Institute high risk fall precautions, as indicated  3. Provide frequent short contacts to provide reality reorientation, refocusing and direction  4. Decrease environmental stimuli, including noise as appropriate  5. Monitor and intervene to maintain adequate nutrition, hydration, elimination, sleep and activity  6. If unable to ensure safety without constant attention obtain sitter and review sitter guidelines with assigned personnel  7. Initiate Psychosocial CNS and Spiritual Care consult, as indicated  10/26/2024 2313 by Franchot Gong, RN  Outcome: Progressing  Flowsheets (Taken 10/26/2024 2033)  Effect of thought disturbance (confusion, delirium, dementia, or psychosis) are managed with adequate functional status:   Assess for contributors to thought disturbance, including medications, impaired vision or hearing, underlying metabolic abnormalities,  dehydration, psychiatric diagnoses, notify  LIP   Institute high risk fall precautions, as indicated   Provide frequent short contacts to provide reality reorientation, refocusing and direction   Decrease environmental stimuli, including noise as appropriate  10/26/2024 1059 by Kriste Browning, LPN  Outcome: Progressing     Problem: Pain  Goal: Verbalizes/displays adequate comfort level or baseline comfort level  10/26/2024 2313 by Franchot Gong, RN  Outcome: Progressing  Flowsheets (Taken 10/26/2024 2033)  Verbalizes/displays adequate comfort level or baseline comfort level:   Encourage patient to monitor pain and request assistance   Assess pain using appropriate pain scale   Administer analgesics based on type and severity of pain and evaluate response   Implement non-pharmacological measures as appropriate and evaluate response   Consider cultural and social influences on pain and pain management   Notify Licensed Independent Practitioner if interventions unsuccessful or patient reports new pain  10/26/2024 1059 by Kriste Browning, LPN  Outcome: Progressing     Problem: Neurosensory - Adult  Goal: Achieves stable or improved neurological status  10/26/2024 2313 by Franchot Gong, RN  Outcome: Progressing  Flowsheets (Taken 10/26/2024 2033)  Achieves stable or improved neurological status:   Assess for and report changes in neurological status   Initiate measures to prevent increased intracranial pressure   Maintain blood pressure and fluid volume within ordered parameters to optimize cerebral perfusion and minimize risk of hemorrhage   Monitor temperature, glucose, and sodium. Initiate appropriate interventions as ordered  10/26/2024 1059 by Kriste Browning, LPN  Outcome: Progressing  Goal: Achieves maximal functionality and self care  10/26/2024 2313 by Franchot Gong, RN  Outcome: Progressing  Flowsheets (Taken 10/26/2024 2033)  Achieves maximal functionality and self care:   Monitor swallowing and airway patency with patient fatigue and changes in neurological  status   Encourage and assist patient to increase activity and self care with guidance from physical therapy/occupational therapy   Encourage visually impaired, hearing impaired and aphasic patients to use assistive/communication devices  10/26/2024 1059 by Kriste Browning, LPN  Outcome: Progressing     Problem: Cardiovascular - Adult  Goal: Maintains optimal cardiac output and hemodynamic stability  10/26/2024 2313 by Franchot Gong, RN  Outcome: Progressing  Flowsheets (Taken 10/26/2024 2033)  Maintains optimal cardiac output and hemodynamic stability:   Monitor blood pressure and heart rate   Monitor urine output and notify Licensed Independent Practitioner for values outside of normal range   Assess for signs of decreased cardiac output   Administer fluid and/or volume expanders as ordered  10/26/2024 1059 by Kriste Browning, LPN  Outcome: Progressing  Goal: Absence of cardiac dysrhythmias or at baseline  10/26/2024 2313 by Franchot Gong, RN  Outcome: Progressing  Flowsheets (Taken 10/26/2024 2033)  Absence of cardiac dysrhythmias or at baseline:   Monitor cardiac rate and rhythm   Assess for signs of decreased cardiac output  10/26/2024 1059 by Kriste Browning, LPN  Outcome: Progressing     Problem: Skin/Tissue Integrity - Adult  Goal: Skin integrity remains intact  Description: 1.  Monitor for areas of redness and/or skin breakdown  2.  Assess vascular access sites hourly  3.  Every 4-6 hours minimum:  Change oxygen saturation probe site  4.  Every 4-6 hours:  If on nasal continuous positive airway pressure, respiratory therapy assess nares and determine need for appliance change or resting period  10/26/2024 2313 by Franchot Gong, RN  Outcome: Progressing  Flowsheets (Taken 10/26/2024 2033)  Skin Integrity Remains Intact:   Monitor  for areas of redness and/or skin breakdown   Assess vascular access sites hourly  10/26/2024 1059 by Kriste Browning, LPN  Outcome: Progressing  Goal: Incisions, wounds, or drain sites healing  without S/S of infection  10/26/2024 2313 by Franchot Gong, RN  Outcome: Progressing  Flowsheets (Taken 10/26/2024 2033)  Incisions, Wounds, or Drain Sites Healing Without Sign and Symptoms of Infection:   ADMISSION and DAILY: Assess and document risk factors for pressure ulcer development   TWICE DAILY: Assess and document skin integrity  10/26/2024 1059 by Kriste Browning, LPN  Outcome: Progressing  Goal: Oral mucous membranes remain intact  10/26/2024 2313 by Franchot Gong, RN  Outcome: Progressing  Flowsheets (Taken 10/26/2024 2033)  Oral Mucous Membranes Remain Intact:   Assess oral mucosa and hygiene practices   Implement preventative oral hygiene regimen   Implement oral medicated treatments as ordered  10/26/2024 1059 by Kriste Browning, LPN  Outcome: Progressing     Problem: Musculoskeletal - Adult  Goal: Return mobility to safest level of function  10/26/2024 2313 by Franchot Gong, RN  Outcome: Progressing  Flowsheets (Taken 10/26/2024 2033)  Return Mobility to Safest Level of Function:   Assess patient stability and activity tolerance for standing, transferring and ambulating with or without assistive devices   Assist with transfers and ambulation using safe patient handling equipment as needed   Ensure adequate protection for wounds/incisions during mobilization  10/26/2024 1059 by Kriste Browning, LPN  Outcome: Progressing  Goal: Maintain proper alignment of affected body part  10/26/2024 2313 by Franchot Gong, RN  Outcome: Progressing  Flowsheets (Taken 10/26/2024 2033)  Maintain proper alignment of affected body part:   Support and protect limb and body alignment per provider's orders   Instruct and reinforce with patient and family use of appropriate assistive device and precautions (e.g. spinal or hip dislocation precautions)  10/26/2024 1059 by Kriste Browning, LPN  Outcome: Progressing  Goal: Return ADL status to a safe level of function  10/26/2024 2313 by Franchot Gong, RN  Outcome: Progressing  Flowsheets (Taken  10/26/2024 2033)  Return ADL Status to a Safe Level of Function:   Administer medication as ordered   Assess activities of daily living deficits and provide assistive devices as needed  10/26/2024 1059 by Kriste Browning, LPN  Outcome: Progressing     Problem: Gastrointestinal - Adult  Goal: Minimal or absence of nausea and vomiting  10/26/2024 2313 by Franchot Gong, RN  Outcome: Progressing  Flowsheets (Taken 10/26/2024 2033)  Minimal or absence of nausea and vomiting:   Administer IV fluids as ordered to ensure adequate hydration   Administer ordered antiemetic medications as needed  10/26/2024 1059 by Kriste Browning, LPN  Outcome: Progressing  Goal: Maintains or returns to baseline bowel function  10/26/2024 2313 by Franchot Gong, RN  Outcome: Progressing  Flowsheets (Taken 10/26/2024 2033)  Maintains or returns to baseline bowel function:   Assess bowel function   Encourage oral fluids to ensure adequate hydration   Administer IV fluids as ordered to ensure adequate hydration   Administer ordered medications as needed  10/26/2024 1059 by Kriste Browning, LPN  Outcome: Progressing  Goal: Maintains adequate nutritional intake  10/26/2024 2313 by Franchot Gong, RN  Outcome: Progressing  Flowsheets (Taken 10/26/2024 2033)  Maintains adequate nutritional intake:   Monitor percentage of each meal consumed   Identify factors contributing to decreased intake, treat as appropriate   Assist with meals as needed   Monitor intake and output, weight and lab values  10/26/2024  1059 by Kriste Browning, LPN  Outcome: Progressing     Problem: Genitourinary - Adult  Goal: Absence of urinary retention  10/26/2024 2313 by Franchot Gong, RN  Outcome: Progressing  Flowsheets (Taken 10/26/2024 2033)  Absence of urinary retention:   Assess patients ability to void and empty bladder   Monitor intake/output and perform bladder scan as needed  10/26/2024 1059 by Kriste Browning, LPN  Outcome: Progressing     Problem: Infection - Adult  Goal: Absence of  infection at discharge  10/26/2024 2313 by Franchot Gong, RN  Outcome: Progressing  Flowsheets (Taken 10/26/2024 2033)  Absence of infection at discharge:   Assess and monitor for signs and symptoms of infection   Monitor lab/diagnostic results  10/26/2024 1059 by Kriste Browning, LPN  Outcome: Progressing  Goal: Absence of infection during hospitalization  10/26/2024 2313 by Franchot Gong, RN  Outcome: Progressing  Flowsheets (Taken 10/26/2024 2033)  Absence of infection during hospitalization:   Assess and monitor for signs and symptoms of infection   Monitor lab/diagnostic results   Monitor all insertion sites i.e., indwelling lines, tubes and drains   Administer medications as ordered  10/26/2024 1059 by Kriste Browning, LPN  Outcome: Progressing     Problem: Metabolic/Fluid and Electrolytes - Adult  Goal: Electrolytes maintained within normal limits  10/26/2024 2313 by Franchot Gong, RN  Outcome: Progressing  Flowsheets (Taken 10/26/2024 2033)  Electrolytes maintained within normal limits:   Monitor labs and assess patient for signs and symptoms of electrolyte imbalances   Administer electrolyte replacement as ordered   Monitor response to electrolyte replacements, including repeat lab results as appropriate  10/26/2024 1059 by Kriste Browning, LPN  Outcome: Progressing  Goal: Hemodynamic stability and optimal renal function maintained  10/26/2024 2313 by Franchot Gong, RN  Outcome: Progressing  Flowsheets (Taken 10/26/2024 2033)  Hemodynamic stability and optimal renal function maintained:   Monitor labs and assess for signs and symptoms of volume excess or deficit   Monitor intake, output and patient weight   Monitor urine specific gravity, serum osmolarity and serum sodium as indicated or ordered   Monitor response to interventions for patient's volume status, including labs, urine output, blood pressure (other measures as available)  10/26/2024 1059 by Kriste Browning, LPN  Outcome: Progressing  Goal: Glucose maintained  within prescribed range  10/26/2024 2313 by Franchot Gong, RN  Outcome: Progressing  Flowsheets (Taken 10/26/2024 2033)  Glucose maintained within prescribed range:   Monitor blood glucose as ordered   Assess for signs and symptoms of hyperglycemia and hypoglycemia   Administer ordered medications to maintain glucose within target range  10/26/2024 1059 by Kriste Browning, LPN  Outcome: Progressing     Problem: Hematologic - Adult  Goal: Maintains hematologic stability  10/26/2024 2313 by Franchot Gong, RN  Outcome: Progressing  Flowsheets (Taken 10/26/2024 2033)  Maintains hematologic stability:   Assess for signs and symptoms of bleeding or hemorrhage   Monitor labs for bleeding or clotting disorders   Administer blood products/factors as ordered  10/26/2024 1059 by Kriste Browning, LPN  Outcome: Progressing     Problem: Safety - Medical Restraint  Goal: Remains free of injury from restraints (Restraint for Interference with Medical Device)  Description: INTERVENTIONS:  1. Determine that other, less restrictive measures have been tried or would not be effective before applying the restraint  2. Evaluate the patient's condition at the time of restraint application  3. Inform patient/family regarding the reason for restraint  4. Q2H: Monitor safety, psychosocial status,  comfort, nutrition and hydration  10/26/2024 2313 by Franchot Gong, RN  Outcome: Progressing  10/26/2024 1059 by Kriste Browning, LPN  Outcome: Progressing     Problem: ABCDS Injury Assessment  Goal: Absence of physical injury  10/26/2024 2313 by Franchot Gong, RN  Outcome: Progressing  10/26/2024 1059 by Kriste Browning, LPN  Outcome: Progressing     "

## 2024-10-26 NOTE — Progress Notes (Signed)
 "    Hospitalist Progress Note      NAME:  Chelsea Schultz   DOB:  1926-02-28  MRM:  [id number]    Date/Time: 10/26/2024  4:00 PM           Assessment / Plan:     89 yo Hx of HTN, DM, interstitial lung dz, dementia, presented w/ AMS, resp failure, hypoxia, pulm fibrosis, enterococcus UTI     Present on Admission:   Severe sepsis (HCC)   Pulmonary fibrosis (HCC)   Acute hypoxemic respiratory failure (HCC)   Community acquired pneumonia   Delirium    # Acute hypoxic respiratory failure  Multifactorial etiology.  Likely combination of pulmonary fibrosis and aspiration pneumonia.  She was treated with IV Zosyn .  DC Zosyn  now that she is on comfort care.  Supplemental oxygen as needed.    # Aspiration pneumonia  # Enterococcus UTI  Imaging concerning for aspiration pneumonia.  MBS completed.  She remains at severe risk of aspiration, now that she is DNR/DNI and transitioning to comfort care, can allow pleasure feeds.  Maintain aspiration precautions.    # Acute metabolic encephalopathy  AMS multifactorial etiology.  Likely combination of underlying dementia and acute delirium.  On exam, she is oriented only to self.  No new focal deficits.  Delirium precautions using a combination of nonpharmacologic and pharmacologic strategies.  Nonpharmacologic interventions : Minimize nighttime interruptions, try to maintain normal sleep-wake cycle, optimize nutrition, frequently reorient.      Pharmacologic : Diazepam /Ativan  as needed.  Bedside sitter if needed.  Maintain fall precautions.    #ACP/goals of care  After extensive goals of care discussion with family, patient transition to comfort care by palliative team.  Appreciate palliative care assistance with this.    # Sacral pressure injury  On exam, minimal loss of skin noted.  No signs of infection.  Local wound care.  Pain control as needed  Frequent repositioning.    # Oral candidiasis  On exam, white plaques seen on buccal mucosa, palate and tongue concerning for mild oral  candidiasis.  Can use nystatin .    # Hypercalcemia  Suspect secondary to dehydration.  PTH within normal limits.    # Pleuritic chest pain  Suspect secondary to pneumonitis.  CTA chest showed no PE.  Follow clinically.    # Diabetes mellitus  A1c 7.0.  Stop Accu-Cheks.    # Hypertension  Stop   antihypertensives.      #BMI (Calculated): 19.98    I have personally reviewed the radiographs, laboratory data in Epic and decisions and statements above are based partially on this personal interpretation.                 Care Plan discussed with: Patient and Family    Discussed:  Code Status and Care Plan    Prophylaxis:  SCD's    Disposition:  Home w/Family           ___________________________________________________    Attending Physician: Debby JINNY Binder, MD        Subjective:     Chief Complaint: Denies any symptoms    ROS:    Patient was seen and examined at bedside.  Patient's daughter at bedside.  I updated her.  Case discussed with RN.     Objective:       Vitals:          Last 24hrs VS reviewed since prior progress note. Most recent are:    Vitals:  10/26/24 0818   BP: (!) 116/53   Pulse: 69   Resp: 16   Temp: 97.9 F (36.6 C)   SpO2: 96%     SpO2 Readings from Last 6 Encounters:   10/26/24 96%   08/19/24 96%   06/26/23 97%   09/05/22 96%   06/19/22 96%   05/11/22 94%        No intake or output data in the 24 hours ending 10/26/24 1600         Exam:     Physical Exam:    Gen: Chronically ill-appearing  HEENT: Dry mucous membrane, white plaque seen on buccal mucosa  Neck:  Supple, without masses, thyroid non-tender  Resp:  No accessory muscle use, clear breath sounds without wheezes rales or rhonchi  Card:  No murmurs, normal S1, S2 without thrills, bruits or peripheral edema  Abd:  Soft, non-tender, non-distended, normoactive bowel sounds are present  Musc:  No cyanosis or clubbing  Skin: Sacral pressure injury, minimal loss of skin, no signs of infection  Neuro: No focal deficits, unable to assess sensations,  reflexes 2+  Psych: Oriented only to self    Medications Reviewed: (see below)    Lab Data Reviewed: (see below)    ______________________________________________________________________    Medications:     Current Facility-Administered Medications   Medication Dose Route Frequency    lidocaine  (XYLOCAINE ) 5 % ointment   Topical PRN    nystatin  (MYCOSTATIN ) 100000 UNIT/ML suspension 500,000 Units  5 mL Oral 4x Daily    miconazole  (MICOTIN) 2 % vaginal cream 1 g  1 applicator Vaginal Nightly    glycopyrrolate  (ROBINUL ) IntraVENous 0.2 mg  0.2 mg IntraVENous Q4H PRN    ondansetron  (ZOFRAN ) injection 4 mg  4 mg IntraVENous Q6H PRN    LORazepam  (ATIVAN ) 2 MG/ML concentrated solution 0.5 mg  0.5 mg SubLINGual Q2H PRN    HYDROmorphone  HCl PF (DILAUDID ) injection 0.25 mg  0.25 mg IntraVENous Q15 Min PRN    diazePAM  (VALIUM ) injection 2.5 mg  2.5 mg IntraVENous Q30 Min PRN    oxyCODONE  (ROXICODONE ) 5 MG/5ML solution 2.5 mg  2.5 mg Oral Q2H PRN    benzocaine  (ORAJEL) 20 % mucosal gel   Mouth/Throat TID    acetaminophen  (TYLENOL ) suppository 650 mg  650 mg Rectal Q6H    hydrALAZINE  (APRESOLINE ) injection 10 mg  10 mg IntraVENous Q6H PRN    acetaminophen  (TYLENOL ) tablet 650 mg  650 mg Oral Q4H PRN    guaiFENesin  (ROBITUSSIN) 100 MG/5ML liquid 200 mg  200 mg Oral Q4H PRN    ipratropium 0.5 mg-albuterol  2.5 mg (DUONEB ) nebulizer solution 1 Dose  1 Dose Inhalation Q4H PRN    prochlorperazine  (COMPAZINE ) injection 10 mg  10 mg IntraVENous Q6H PRN    sodium chloride  flush 0.9 % injection 5-40 mL  5-40 mL IntraVENous PRN    0.9 % sodium chloride  infusion   IntraVENous PRN    albuterol  (PROVENTIL ) (2.5 MG/3ML) 0.083% nebulizer solution 2.5 mg  2.5 mg Nebulization Q4H PRN            Lab Review:     No results for input(s): WBC, HGB, HCT, PLT in the last 72 hours.    No results for input(s): NA, K, CL, CO2, GLU, BUN, MG, PHOS, ALT, INR in the last 72 hours.    Invalid input(s): CREA, CA, ALB, TBIL,  SGOT    No components found for: GLPOC        "

## 2024-10-27 LAB — PTH-RELATED PEPTIDE: PTH Related Peptide: <2 pmol/L

## 2024-10-27 MED ORDER — ACETAMINOPHEN 650 MG RE SUPP
650 | Freq: Two times a day (BID) | RECTAL | Status: DC
Start: 2024-10-27 — End: 2024-10-27

## 2024-10-27 MED ORDER — ACETAMINOPHEN 325 MG PO TABS
325 | Freq: Three times a day (TID) | ORAL | Status: DC
Start: 2024-10-27 — End: 2024-10-29

## 2024-10-27 MED FILL — REFRESH CELLUVISC 1 % OP GEL: 1 % | OPHTHALMIC | Qty: 0.13 | Fill #0

## 2024-10-27 MED FILL — ACETAMINOPHEN 650 MG RE SUPP: 650 mg | RECTAL | Qty: 1 | Fill #0

## 2024-10-27 MED FILL — NYSTATIN 100000 UNIT/ML MT SUSP: 100000 [IU]/mL | OROMUCOSAL | Qty: 5 | Fill #0

## 2024-10-27 MED FILL — REFRESH CELLUVISC 1 % OP GEL: 1 % | OPHTHALMIC | Qty: 1 | Fill #0

## 2024-10-27 MED FILL — GLYCOPYRROLATE 0.2 MG/ML IJ SOLN: 0.2 mg/mL | INTRAMUSCULAR | Qty: 1 | Fill #0

## 2024-10-27 NOTE — Progress Notes (Signed)
 "    Hospitalist Progress Note      NAME:  Chelsea Schultz   DOB:  January 05, 1926  MRM:  [id number]    Date/Time: 10/27/2024  3:29 PM           Assessment / Plan:     89 yo Hx of HTN, DM, interstitial lung dz, dementia, presented w/ AMS, resp failure, hypoxia, pulm fibrosis, enterococcus UTI     Present on Admission:   Severe sepsis (HCC)   Pulmonary fibrosis (HCC)   Acute hypoxemic respiratory failure (HCC)   Community acquired pneumonia   Delirium    # Acute hypoxic respiratory failure  Multifactorial etiology.  Likely combination of pulmonary fibrosis and aspiration pneumonia.  She was treated with IV Zosyn  previously.  Antibiotics discontinued since she is on comfort care now.  Supplemental oxygen as needed.    # Aspiration pneumonia  # Enterococcus UTI  Imaging concerning for aspiration pneumonia.  MBS completed.  She remains at severe risk of aspiration, now that she is DNR/DNI and transitioning to comfort care, can allow pleasure feeds.  Maintain aspiration precautions.    # Acute metabolic encephalopathy  AMS multifactorial etiology.  Likely combination of underlying dementia and acute delirium.  On exam, she is oriented only to self.  No new focal deficits.  Delirium precautions using a combination of nonpharmacologic and pharmacologic strategies.  Nonpharmacologic interventions : Minimize nighttime interruptions, try to maintain normal sleep-wake cycle, optimize nutrition, frequently reorient.      Pharmacologic : Diazepam /Ativan  as needed.  Bedside sitter if needed.  Maintain fall precautions.    #ACP/goals of care  After extensive goals of care discussion with family, patient transition to comfort care by palliative team.  Appreciate palliative care assistance with this.    # Sacral pressure injury  On exam, minimal loss of skin noted.  No signs of infection.  Local wound care.  Pain control as needed  Frequent repositioning.    # Oral candidiasis  On exam, white plaques seen on buccal mucosa, palate and  tongue concerning for mild oral candidiasis.  Can use nystatin .    # Hypercalcemia  Suspect secondary to dehydration.  PTH within normal limits.    # Pleuritic chest pain  Suspect secondary to pneumonitis.  CTA chest showed no PE.  Follow clinically.    # Diabetes mellitus  A1c 7.0.  Stop Accu-Cheks.    # Hypertension  Stop antihypertensives.      #BMI (Calculated): 19.98    I have personally reviewed the radiographs, laboratory data in Epic and decisions and statements above are based partially on this personal interpretation.                 Care Plan discussed with: Patient and Family    Discussed:  Code Status and Care Plan    Prophylaxis:  SCD's    Disposition: Home hospice vs hospice at facility           ___________________________________________________    Attending Physician: Debby JINNY Binder, MD        Subjective:     Chief Complaint: Denies any symptoms    ROS:    Patient was seen and examined at bedside.  Patient's daughter at bedside.  I updated her.  Case discussed with RN.     Objective:       Vitals:          Last 24hrs VS reviewed since prior progress note. Most recent are:  Vitals:    10/27/24 0955   BP: 119/64   Pulse:    Resp:    Temp:    SpO2:      SpO2 Readings from Last 6 Encounters:   10/27/24 94%   08/19/24 96%   06/26/23 97%   09/05/22 96%   06/19/22 96%   05/11/22 94%          Intake/Output Summary (Last 24 hours) at 10/27/2024 1529  Last data filed at 10/26/2024 2033  Gross per 24 hour   Intake 10 ml   Output --   Net 10 ml            Exam:     Physical Exam:    Gen: Chronically ill-appearing  HEENT: Dry mucous membrane, white plaque seen on buccal mucosa  Neck:  Supple, without masses, thyroid non-tender  Resp:  No accessory muscle use, clear breath sounds without wheezes rales or rhonchi  Card:  No murmurs, normal S1, S2 without thrills, bruits or peripheral edema  Abd:  Soft, non-tender, non-distended, normoactive bowel sounds are present  Musc:  No cyanosis or clubbing  Skin: Sacral  pressure injury, minimal loss of skin, no signs of infection  Neuro: No focal deficits, unable to assess sensations, reflexes 2+  Psych: Oriented only to self    Medications Reviewed: (see below)    Lab Data Reviewed: (see below)    ______________________________________________________________________    Medications:     Current Facility-Administered Medications   Medication Dose Route Frequency    acetaminophen  (TYLENOL ) tablet 650 mg  650 mg Oral TID    hydrocortisone  (ANUSOL -HC) 2.5 % rectal cream   Rectal TID    carboxymethylcellulose PF (THERATEARS/REFRESH) 1 % ophthalmic gel 1 drop  1 drop Both Eyes TID    lidocaine  (XYLOCAINE ) 5 % ointment   Topical PRN    nystatin  (MYCOSTATIN ) 100000 UNIT/ML suspension 500,000 Units  5 mL Oral 4x Daily    miconazole  (MICOTIN) 2 % vaginal cream 1 g  1 applicator Vaginal Nightly    glycopyrrolate  (ROBINUL ) IntraVENous 0.2 mg  0.2 mg IntraVENous Q4H PRN    ondansetron  (ZOFRAN ) injection 4 mg  4 mg IntraVENous Q6H PRN    LORazepam  (ATIVAN ) 2 MG/ML concentrated solution 0.5 mg  0.5 mg SubLINGual Q2H PRN    HYDROmorphone  HCl PF (DILAUDID ) injection 0.25 mg  0.25 mg IntraVENous Q15 Min PRN    diazePAM  (VALIUM ) injection 2.5 mg  2.5 mg IntraVENous Q30 Min PRN    oxyCODONE  (ROXICODONE ) 5 MG/5ML solution 2.5 mg  2.5 mg Oral Q2H PRN    benzocaine  (ORAJEL) 20 % mucosal gel   Mouth/Throat TID    hydrALAZINE  (APRESOLINE ) injection 10 mg  10 mg IntraVENous Q6H PRN    guaiFENesin  (ROBITUSSIN) 100 MG/5ML liquid 200 mg  200 mg Oral Q4H PRN    ipratropium 0.5 mg-albuterol  2.5 mg (DUONEB ) nebulizer solution 1 Dose  1 Dose Inhalation Q4H PRN    prochlorperazine  (COMPAZINE ) injection 10 mg  10 mg IntraVENous Q6H PRN    sodium chloride  flush 0.9 % injection 5-40 mL  5-40 mL IntraVENous PRN    0.9 % sodium chloride  infusion   IntraVENous PRN    albuterol  (PROVENTIL ) (2.5 MG/3ML) 0.083% nebulizer solution 2.5 mg  2.5 mg Nebulization Q4H PRN            Lab Review:     No results for input(s): WBC,  HGB, HCT, PLT in the last 72 hours.    No results for  input(s): NA, K, CL, CO2, GLU, BUN, MG, PHOS, ALT, INR in the last 72 hours.    Invalid input(s): CREA, CA, ALB, TBIL, SGOT    No components found for: GLPOC        "

## 2024-10-27 NOTE — Progress Notes (Signed)
 "Palliative Medicine  Patient Name: Chelsea Schultz  Date of Birth: 05/02/1926  MRN: [id number]  Age: 89 y.o.  Gender: female    Date of Initial Consult: 10/20/24  Date of Service: 10/27/2024  Time: 9:48 AM  Provider: Franchot Dickens, APRN - [facility] Day: 70  Admit Date: 10/10/2024  Referring Provider:  Dr. Rosalea       Reasons for Consultation:  Other: goals of care, code status    HISTORY OF PRESENT ILLNESS (HPI):   KEYLEN UZELAC is a 89 y.o. female with a past medical history of interstitial lung disease, prior CVA (2013, 2016), DM, HTN, recurrent UTIs who was admitted on 10/10/2024 from home with productive cough, fatigue for ~1 week. Upon arrival to ED, noted to be febrile and hypoxic requiring supplemental oxygen. Initial chest imaging obtained in ED revealing diffuse interstitial prominence of ground glass opacities suggestive of pulmonary fibrosis vs vs pneumonitis vs pneumonia. Was started on empiric IV antibiotics and admitted for further workup, medical management. Course has been significant for UTI, hospital acquired delirium with waxing/waning mentation. Review indicates fluctuating agitation causing her to self remove supplemental oxygen causing hypoxia, which has prompted several RRTs. Also with increasing lethargy, overall decline, concerns of aspiration with progressive dysphagia. Plan for MBS today. Palliative consulted to assist with goals.       Psychosocial: Six children - 5 living children to include four daughters and one son. Lives with daughter [name], pt has resided with Cathy since 2011. Prior, lived with daughter [name] in [place]. [name] is primary caregiver, does work full time hours at the [employer]. Additionally family has put in place private caregiver M-F 9-5, and 3 evenings a week from 5-9.     Recent baseline prior to admission: obtained from daughters   -Cognitively, able to recognize family members, able to carry on discussion/conversation with daughters and caregivers. Frequently has  disorientation in the evenings where she can become tearful/emotional but often redirectable.   -Functionally, able to stand/pivot and participate in some of her care albeit essentially reliant on all ADLs including bathing, toileting, dressing. Requires meal preparation.     Pt seen today at bedside prior to family meeting with four daughters. Currently resting in bed, on 3L NC. No distress, is confused but redirectable without overt agitation.    -12/30: Seen mid afternoon, dtr Kim at bedside but no other family present at time of visit. [name] was present earlier, [name] coming in around ~5:00-5:30 PM today. Unfortunately with significant decline since this morning. She is much less responsive, does not open eyes to voice. Suspected aspiration event thus reverted back to NPO.   -12/31: Seen midmorning, dtr Luke and dtr [name] at bedside. No significant changes from yesterday as pt remains minimally responsive, family reports she had finally settled after generalized discomfort this morning so did not wake per request. Remains on 2L NC but no distress. No prn medication requirements.   -1/2: Seen midmorning, dtr Jori and Adrien present. Pt resting comfortably on 2L NC. No notable symptoms, appears comfortable. Family states she slept through well through the evening. Feels as if Tylenol  suppositories are helping generalized pain.   1/5: Seen midmorning, comfortable appearing resting in bed. Debra and I review interval events and medication requirements, new orders over the weekend. She shares she has been a bit more awake at times and able to tolerate portions of pureed meals as well as sips of water  and tolerated well thus far. Enjoyed more family visitors  over the weekend. Slept through the night last night comfortably. Has required a few doses of symptom medications albeit not many.     PALLIATIVE DIAGNOSES:    Acute hypoxic respiratory failure; multifactorial  Delirium superimposed on baseline  dementia  Dysphagia  High risk aspiration   Physical debility, generalized weakness   Goals of care  Advance care planning   Code status discussion     ASSESSMENT AND PLAN:   Chart reviewed prior to seeing pt; including notes, labs, imaging.   Interval events reviewed and discussed with dtr, Adrien, at bedside as well as bedside nursing team, [employer].   Care goals --   Comfort measures   Discussed next steps as we broached last week, which is bringing in hospice for evaluation and discussing next steps. Provided Adrien update this morning given her exam, her ability to tolerate some oral intake and sublingual medications, minimal IV symptom requirements thus far -- do not think she would meet the strict criteria we discussed last week as far as inpatient hospice level of care.   While this requires more coordination as far as hospice with placement, as at this time this remains their wish, share my support that this is a gift for family to spend more time with pt as she does not appear to be at imminent end of life at this time.   Adrien will update family as well as aware of next steps, discussing hospice agencies/options with case management. They would like a list of agencies to research and hoping to meet with CM this afternoon -- discussed and updated case management, Joan.   Goals/summary: comfort measures, hospice consult to evaluate and discuss likely placement given at this point in time do not suspect she meets criteria for GIP. Continue to monitor her symptom needs.   Code status --   DNR/DNI  ACP --   Pt adult children as LNOK - all work together in decision making   Symptoms -- will have both oral/sublingual and IV options as needed  Anxiety/restlessness/agitation:   Ativan  0.5 mg oral solution every 2 hours prn   Valium  2.5 mg IV every 30 minutes prn   Nonverbal signs of pain:   Given some periodic improvements in mentation - will transition Tylenol  650 mg three times daily to oral tablets to be  crushed in pureed consistencies. OK for pt/family to refuse.   Oxycodone  2.5 mg oral solution every 2 hours prn   Dilaudid  0.25 mg IV every 15 minutes prn   All other comfort medications as ordered   Agree with new orders placed over the weekend for concern of oral/vaginal thrush as well as hydrocortisone  cream for concern of external hemorrhoid.   Initial consult note routed to primary continuity provider and/or primary health care team members  Please call with any palliative questions or concerns.  Palliative Care Team is available via perfect serve or via phone.    Referrals to:   []  Outpatient Palliative Care  []  Home Based Palliative Care  []  Home Based Primary Care  [x]  Hospice       ADVANCE CARE PLANNING:   []  The Pall Med Interdisciplinary Team has updated the ACP Navigator with Health Care Decision Maker and Patient Capacity      Primary Decision Maker: Georganna, Maxson Child [phone]    Primary Decision Maker: Manya Notice - Child [phone]  Confirm Advance Directive: None  Patient Would Like to Complete Advance Directive: Unable    Current Code  Status: DNR     Goals of Care: Goals of Care and Interventions  Patient/Health Care Proxy Stated Goals: Recovery from acute illness  Medical Interventions: Full interventions  Life Goals  Patient and Family Personal Goals: continue current measures, hopeful for recovery    Please refer to Palliative Medicine ACP notes for further details.    PALLIATIVE ASSESSMENT:      Palliative Performance Scale (PPS):  PPS: 30    ECOG:        Modified ESAS:  Modified-[place] Symptom Assessment Scale (ESAS)  Pain Score: No pain  Dyspnea Score: No shortness of breath    Clinical Pain Assessment (nonverbal scale for severity on nonverbal patients):   Clinical Pain Assessment  Severity: 0  Location: UTA  Character: UTA  Duration: UTA  Factors: UTA  Frequency: UTA       NVPS:  Adult Nonverbal Pain Scale (NVPS)  Face: Occasional grimace, tearing, frowning,  wrinkled forehead  Activity (Movement): Seeks attention through movement, slow cautious movements  Guarding: Splinting areas of the body, tense  Physiology (Vital Signs): Stable vital signs  Respiratory: Baseline RR/SpO2 compliant with ventilator  NVPS Score : 3    RDOS:  RDOS  Heart rate per minute: less than 90  Respiratory Rate per Minute: less than 19  Restlessness:non-purposeful: None  Paradoxical breathing pattern:abdomen moves in on inspiration: None  Accessory muscle use: rise in clavicle during inspiration: None  Grunting at end-expiration: guttural sound: None  Nasal flaring: involuntary movement of nares: None  Total : 0      Vital Signs: Blood pressure (!) 104/42, pulse 72, temperature 98.6 F (37 C), temperature source Axillary, resp. rate 14, height 1.499 m (4' 11.02), weight 44.9 kg (98 lb 15.8 oz), SpO2 94%.    PHYSICAL ASSESSMENT:   General: []  Oriented x3  []  Well appearing  []  Intubated  [x] Ill appearing  [] Other: minimally responsive   Mental Status: []  Normal mental status exam  []  Drowsy  []  Confused  [] Other:  Cardiovascular: []  Regular rate/rhythm  []  Arrhythmia  [x]  Other: minimally responsive   Chest: [x]  Effort normal  [] Lungs clear  []  Respiratory distress  [] Tachypnea  []  Other: 2L NC  Abdomen: []  Soft/non-tender  []  Normal appearance  []  Distended  []  Ascites  []  Other:  Neurological: []  Normal speech  []  Normal sensation  [x] Deficits present: minimally responsive, unable to follow commands, does not open eyes to voice   Extremity: []  Normal skin color/temp  []  Clubbing/cyanosis  []  No edema  [x]  Other: debilitated appearing, temporal muscle wasting noted     Wt Readings from Last 15 Encounters:   10/17/24 44.9 kg (98 lb 15.8 oz)   06/26/23 40.8 kg (90 lb)   06/19/22 49.9 kg (110 lb)   02/24/22 48.8 kg (107 lb 9.6 oz)   12/29/21 43.5 kg (96 lb)        Current Diet: ADULT DIET; Dysphagia - Pureed       PSYCHOSOCIAL/SPIRITUAL SCREENING:   Palliative IDT has assessed this patient for  cultural preferences / practices and a referral made as appropriate to needs Gaffer, Patient Advocacy, Ethics, etc.)    Spiritual Affiliation: Non-Denominational    Any spiritual / religious concerns:  []  Yes /  [x]  No   If Yes to discuss with pastoral care during IDT     Does caregiver feel burdened by caring for their loved one:   []  Yes /  [x]  No /  []  No Caregiver Present/Available []   No Caregiver []  Pt Lives at Facility  If Yes to discuss with social work during IDT    Anticipatory grief assessment:   [x]  Normal  / []  Maladaptive     If Maladaptive to discuss with social work during IDT    ESAS Anxiety:      ESAS Depression:          LAB AND IMAGING FINDINGS:   Objective data reviewed:  labs, images, records, medication use, vitals, and chart     FINAL COMMENTS   Thank you for allowing Palliative Medicine to participate in the care of Leonie VEAR Pouch.    Only check if applicable and billing time based rather than MDM  []  The total encounter time on this service date was __35__ minutes which was spent performing a face-to-face encounter and personally completing the provider-level activities documented in the note. This includes time spent prior to the visit and after the visit in direct care of the patient. This time does not include time spent in any separately reportable services.    Electronically signed by   Franchot Dickens, APRN - CNP  Palliative Care Team  on 10/27/2024 at 9:48 AM      "

## 2024-10-28 MED ORDER — ACETAMINOPHEN 650 MG RE SUPP
650 | Freq: Four times a day (QID) | RECTAL | Status: DC | PRN
Start: 2024-10-28 — End: 2024-11-03
  Administered 2024-10-28 – 2024-10-30 (×6): 650 mg via RECTAL

## 2024-10-28 MED ORDER — THERA-DERM EX LOTN
CUTANEOUS | Status: DC | PRN
Start: 2024-10-28 — End: 2024-11-03

## 2024-10-28 MED ORDER — DIPHENHYDRAMINE HCL 50 MG/ML IJ SOLN
50 | Freq: Every evening | INTRAMUSCULAR | Status: DC | PRN
Start: 2024-10-28 — End: 2024-10-29
  Administered 2024-10-29: 02:00:00 12.5 mg via INTRAVENOUS

## 2024-10-28 MED FILL — REFRESH CELLUVISC 1 % OP GEL: 1 % | OPHTHALMIC | Qty: 0.13 | Fill #0

## 2024-10-28 MED FILL — ACETAMINOPHEN 650 MG RE SUPP: 650 mg | RECTAL | Qty: 1 | Fill #0

## 2024-10-28 MED FILL — REFRESH CELLUVISC 1 % OP GEL: 1 % | OPHTHALMIC | Qty: 1 | Fill #0

## 2024-10-28 MED FILL — NYSTATIN 100000 UNIT/ML MT SUSP: 100000 [IU]/mL | OROMUCOSAL | Qty: 5 | Fill #0

## 2024-10-28 NOTE — Plan of Care (Signed)
"    Problem: Skin/Tissue Integrity  Goal: Skin integrity remains intact  Description: 1.  Monitor for areas of redness and/or skin breakdown  2.  Assess vascular access sites hourly  3.  Every 4-6 hours minimum:  Change oxygen saturation probe site  4.  Every 4-6 hours:  If on nasal continuous positive airway pressure, respiratory therapy assess nares and determine need for appliance change or resting period  Outcome: Progressing     "

## 2024-10-28 NOTE — Care Coordination (Addendum)
 Care Management Progress Note        Reason for Admission:   Hypoxemia [R09.02]  Severe sepsis (HCC) [A41.9, R65.20]  Community acquired pneumonia, unspecified laterality [J18.9]         Patient Admission Status: Inpatient  RUR: 16%  Hospitalization in the last 30 days (Readmission):  No        Transition of care plan:  Patient was discussed in IDR and continues to be medically managed. Patient remains on comfort care.    Dispo: TBD.    CM has asked [facility] to come to bedside for a GIP evaluation. Clinicals have been sent in [place].     CM has spoken with pt's daughter Marval at bedside. Home with hospice is not an option at this time.     CM has provided [name] with a SNF list for LTC in the [place] area with a 40 mile radius. CM has also provided a SNF list for LTC in the [place]/ [place] area with a 40 mile radius. CM has emailed both lists to [place] at [email or web address] so she can share with family. Patient has a [place]  medicaid application pending T# [id number]. Family to discuss hospice at a facility in [place] vs [place]. CM following for final SNF preferences. CM will need to send out preferences to determine which SNF's can take a patient with hospice and a T#. [place] SNF facilities do not have the ability to take [facility] pending as a payment source. Patient would need to pay out of pocket for a SNF facility in [place] until family has applied for [place] medicaid. Hospice preference to be determined once final facility preference is obtained due to facility contracts. CM has discussed all of the above with [name].    Discharge plan communicated with patient and/or discharge caregiver: Yes      Date 1st IMM letter given: 10/15/24    Outpatient follow-up.    Transport at discharge: stretcher transport.         ___________________________________________   [name], RN Case Manager  10/28/2024   2:15 PM

## 2024-10-28 NOTE — Progress Notes (Signed)
 [place] [facility]  [address] [place], [place] [address]  [phone]      Hospitalist  Progress Note      NAME:       Chelsea Schultz   DOB:        Oct 22, 1926  MRM:        [id number]    Date of service: 10/28/2024      Subjective: Patient seen and examined by me as a follow up. Patient admitted with acute encephalopathy due to a UTI as well as suspected aspiration pneumonia. She has since been transitioned to comfort care measures. CM working with family on discharge planning. Daughter indicated she the patient has been having itching    Objective:    Vital Signs:    BP (!) 105/41   Pulse 71   Temp 98.2 F (36.8 C) (Axillary)   Resp 16   Ht 1.499 m (4' 11.02)   Wt 44.9 kg (98 lb 15.8 oz)   SpO2 91%   BMI 19.98 kg/m        Intake/Output Summary (Last 24 hours) at 10/28/2024 1444  Last data filed at 10/27/2024 1816  Gross per 24 hour   Intake --   Output 200 ml   Net -200 ml        Current inpatient medications reviewed:  Current Facility-Administered Medications   Medication Dose Route Frequency    therapeutic (THERA-DERM) hand/body lotion   Topical PRN    acetaminophen  (TYLENOL ) suppository 650 mg  650 mg Rectal Q6H PRN    diphenhydrAMINE  (BENADRYL ) injection 12.5 mg  12.5 mg IntraVENous Nightly PRN    acetaminophen  (TYLENOL ) tablet 650 mg  650 mg Oral TID    hydrocortisone  (ANUSOL -HC) 2.5 % rectal cream   Rectal TID    carboxymethylcellulose PF (THERATEARS/REFRESH) 1 % ophthalmic gel 1 drop  1 drop Both Eyes TID    lidocaine  (XYLOCAINE ) 5 % ointment   Topical PRN    nystatin  (MYCOSTATIN ) 100000 UNIT/ML suspension 500,000 Units  5 mL Oral 4x Daily    miconazole  (MICOTIN) 2 % vaginal cream 1 g  1 applicator Vaginal Nightly    glycopyrrolate  (ROBINUL ) IntraVENous 0.2 mg  0.2 mg IntraVENous Q4H PRN    ondansetron  (ZOFRAN ) injection 4 mg  4 mg IntraVENous Q6H PRN    LORazepam  (ATIVAN ) 2 MG/ML concentrated solution 0.5 mg   0.5 mg SubLINGual Q2H PRN    HYDROmorphone  HCl PF (DILAUDID ) injection 0.25 mg  0.25 mg IntraVENous Q15 Min PRN    diazePAM  (VALIUM ) injection 2.5 mg  2.5 mg IntraVENous Q30 Min PRN    oxyCODONE  (ROXICODONE ) 5 MG/5ML solution 2.5 mg  2.5 mg Oral Q2H PRN    benzocaine  (ORAJEL) 20 % mucosal gel   Mouth/Throat TID    hydrALAZINE  (APRESOLINE ) injection 10 mg  10 mg IntraVENous Q6H PRN    guaiFENesin  (ROBITUSSIN) 100 MG/5ML liquid 200 mg  200 mg Oral Q4H PRN    ipratropium 0.5 mg-albuterol  2.5 mg (DUONEB ) nebulizer solution 1 Dose  1 Dose Inhalation Q4H PRN    prochlorperazine  (COMPAZINE ) injection 10 mg  10 mg IntraVENous Q6H PRN    sodium chloride  flush 0.9 % injection 5-40 mL  5-40 mL IntraVENous PRN    0.9 % sodium chloride  infusion   IntraVENous PRN    albuterol  (PROVENTIL ) (2.5 MG/3ML) 0.083% nebulizer solution 2.5 mg  2.5 mg Nebulization Q4H PRN       Physical Examination:    General:  Weak, cachectic and ill looking patient in no acute distress  Eyes:   pink conjunctivae, PERRLA with no discharge.  ENT:   no ottorrhea or rhinorrhea with dry mucous membranes  Neck: no masses, thyroid non-tender and trachea central.  Pulm:  no accessory muscle use, decreased breath sounds with scattered crackles. No wheezes  Card:  no JVD or murmurs, has regular and normal S1, S2 without thrills, bruits or peripheral edema  Abd:  Soft, non-tender, non-distended, normoactive bowel sounds with no palpable organomegaly  Musc:  No cyanosis, clubbing, atrophy or deformities.  Skin:  No rashes, bruising or ulcers.   Neuro: Awake and alert. Generally a non focal exam.   Psych:  Has little insight to her illness     Diagnostic testing:    Laboratory data reviewed and independently interpreted:    No results for input(s): WBC, HGB, HCT, RBC, MCV, MCH, PLT in the last 72 hours.  No results found for: LACTA  No results for input(s): NA, K, CL, CO2, GLUCOSE, BUN, CREATININE, CALCIUM , MG, PHOS, BILITOT,  ALKPHOS, AST, ALT, INR in the last 72 hours.    Invalid input(s): PROT, ALB  No components found for: GLUCOSEPOC  Lab Results   Component Value Date/Time    CHOL 142 09/23/2018 02:19 PM    TRIG 73 09/23/2018 02:19 PM    HDL 56 09/23/2018 02:19 PM     Imaging data reviewed:    No results found.    Cultures, Imaging studies reviewed and reports noted, Telemetry reviewed and independently interpreted by me and available notes from other care providers - all reviewed by me on: 10/28/2024    Assessment and Plan:     Acute hypoxemic respiratory failure (HCC)   Aspiration pneumonia (HCC)   Pulmonary fibrosis (HCC)   UTI (urinary tract infection)   Type 2 diabetes mellitus with diabetic neuropathy (HCC)    HTN (hypertension), benign    History of cerebrovascular accident    Delirium    Dementia   - she has been reviewed by palliative care and currently awaiting review by hospice   - continue current care, add IV Benadryl  PRN at bedtime for itching   - comfort feeding   - CM to work with family and guide on dispo    Care Plan discussed with: Family, Nursing, CM               Expected Disposition:  Hospice    PCP:      Gonzella Lorane MATSU, MD     I have personally examined and treated the patient at bedside during this period. To assist coordination of care and communication with nursing and staff, this note may be preliminary early in the day, but finalized by end of the day.         ___________________________________________________    Attending Physician:   Lamar Doom, MD

## 2024-10-28 NOTE — Plan of Care (Signed)
 Problem: Skin/Tissue Integrity  Goal: Skin integrity remains intact  Description: 1.  Monitor for areas of redness and/or skin breakdown  2.  Assess vascular access sites hourly  3.  Every 4-6 hours minimum:  Change oxygen saturation probe site  4.  Every 4-6 hours:  If on nasal continuous positive airway pressure, respiratory therapy assess nares and determine need for appliance change or resting period  10/28/2024 1018 by Sheena Fellows, RN  Outcome: Progressing     Problem: Safety - Adult  Goal: Free from fall injury  10/28/2024 1018 by Sheena Fellows, RN  Outcome: Progressing     Problem: Chronic Conditions and Co-morbidities  Goal: Patient's chronic conditions and co-morbidity symptoms are monitored and maintained or improved  10/28/2024 1018 by Sheena Fellows, RN  Outcome: Progressing     Problem: Discharge Planning  Goal: Discharge to home or other facility with appropriate resources  Outcome: Progressing     Problem: Respiratory - Adult  Goal: Achieves optimal ventilation and oxygenation  Outcome: Progressing     Problem: Neurosensory - Adult  Goal: Achieves stable or improved neurological status  Outcome: Progressing     Problem: Neurosensory - Adult  Goal: Achieves maximal functionality and self care  Outcome: Progressing     Problem: Cardiovascular - Adult  Goal: Maintains optimal cardiac output and hemodynamic stability  Outcome: Progressing     Problem: Cardiovascular - Adult  Goal: Absence of cardiac dysrhythmias or at baseline  Outcome: Progressing     Problem: Skin/Tissue Integrity - Adult  Goal: Skin integrity remains intact  Description: 1.  Monitor for areas of redness and/or skin breakdown  2.  Assess vascular access sites hourly  3.  Every 4-6 hours minimum:  Change oxygen saturation probe site  4.  Every 4-6 hours:  If on nasal continuous positive airway pressure, respiratory therapy assess nares and determine need for appliance change or resting period  10/28/2024 1018 by Sheena Fellows,  RN  Outcome: Progressing     Problem: Skin/Tissue Integrity - Adult  Goal: Incisions, wounds, or drain sites healing without S/S of infection  Outcome: Progressing     Problem: Skin/Tissue Integrity - Adult  Goal: Oral mucous membranes remain intact  Outcome: Progressing     Problem: Musculoskeletal - Adult  Goal: Return mobility to safest level of function  Outcome: Progressing     Problem: Musculoskeletal - Adult  Goal: Maintain proper alignment of affected body part  Outcome: Progressing

## 2024-10-28 NOTE — Progress Notes (Signed)
 Spiritual Health Palliative Care Note        Room # 420/01    Name: Chelsea Schultz           Age: 89 y.o.    Gender: female          MRN: [id number]  Religion: Non-denominational       Preferred Language: English      Date: 10/28/24  Visit Time: Begin Time: 1020 End Time : 1058 Complexity of Encounter: Low      Visit Summary: Chaplain responded to referral from the palliative care team. Family were receptive to visit. Patient, Chelsea Schultz is asleep at this time and her daughter Barnie is present and shares life review and care plans. Pt has 6 of 7 living adult children; most of them live in the [place], [place] area. According to Chelsea Barnie, Pt holds a strong Christian faith, has an materials engineer and loves to the the [employer]. When awake, Chelsea Schultz is mainly fully coherent, aware and talkative. Family shared that Pt said that this is not living, I want to die, and gets irritated. Family is waiting to talk with Palliative team and case manager about long term placement with hospice availability, Chelsea Barnie said, and expressed gratitude for the supportive visits and prayers.   Chaplain provided a supportive, calming presence, active listening, spiritual and emotional support, prayer, assurance of availability. No other needs expressed at this time. Chaplain available for follow up as needed/desired by patient and/or family.        Encounter Overview/Reason: Follow-up, Family Care  Encounter Code:     Crisis (if applicable):    Service Provided For: Family, Patient not available     Patient was not available.   <IF NOT AVAILABLE>  Patient: was working with other staff/was asleep/ indisposed. Chaplain will follow-up at a later time.      Faith, Belief, Meaning:   Patient identifies as spiritual  is connected with a faith tradition or spiritual practice  has beliefs or practices that help with coping during difficult times  faith/ spirituality is a source of strength  Family/Friends identifies as  spiritual  are connected with a faith tradition or spiritual practice  have beliefs or practices that help with coping during difficult times  faith/ spirituality is a source of strength    Importance and Influence:  Patient has spiritual/personal beliefs that influence decisions regarding their health  Family/Friends has spiritual/personal beliefs that influence decisions regarding the patient's health    Community:  Patient   is connected with a spiritual community  indicated that they feel well-supported  Support System Includes   Children  [employer]  Extended family  Family/Friends   is connected with a spiritual community  indicated that they feel well-supported  Support System Includes   Spouse/Partner  Faith Community  Friends  Extended family    Assessment and Plan of Care:   Emotions Expressed by Patient:     Interventions by Chaplain:   active listening  explored belief system  facilitated expression of thoughts and feelings  explored spiritual struggle/ distress  explored coping skills  affirmed coping skills/ support systems  engaged in theological/ spiritual reflection  prayer  provided ministry of presence  facilitated life review and/or legacy    Result/ Response by Patient:   other:sleeping     Patient Plan of Care:   Spiritual care available upon referral.    Emotions Expressed by Spouse/Family/Friends:   calm  grieving  sad    Chaplain Interventions with Spouse/ Family/Friends include:   active listening  explored belief system  explored meaning/ purpose  facilitated expression of thoughts and feelings  explored spiritual struggle/ distress  explored coping skills  affirmed coping skills/ support systems  engaged in theological/ spiritual reflection  prayer  provided ministry of presence  facilitated life review and/or legacy    Spouse/Family/Friends Plan of Care:   Spiritual care available upon referral.      Code Status:   Patient indicated they have/have not discussed code status with  their doctor.   Patient stated their code status is:  DNR  Full Code  Partial Code    HCPOA:  Patient does/ does not have an Advance Directive on file.     Designated Decision Maker:    Primary Decision Maker: Tahtiana, Rozier - Child - [phone]    Primary Decision Maker: Manya Notice - Child [phone]        Electronically signed by  Elia Lamar Gowers, M.Div., [facility].  For Chaplain support, please call Spiritual Health Team @ [phone] PRAY [phone])

## 2024-10-29 MED ORDER — DIPHENHYDRAMINE-ZINC ACETATE 1-0.1 % EX CREA
1-0.1 | Freq: Three times a day (TID) | CUTANEOUS | Status: DC | PRN
Start: 2024-10-29 — End: 2024-11-03
  Administered 2024-10-29 – 2024-11-01 (×4): via TOPICAL

## 2024-10-29 MED ORDER — DIPHENHYDRAMINE HCL 50 MG/ML IJ SOLN
50 | Freq: Every evening | INTRAMUSCULAR | Status: DC | PRN
Start: 2024-10-29 — End: 2024-10-31
  Administered 2024-10-30 – 2024-10-31 (×2): 25 mg via INTRAVENOUS

## 2024-10-29 MED FILL — HYDROMORPHONE HCL 1 MG/ML IJ SOLN: 1 mg/mL | INTRAMUSCULAR | Qty: 1 | Fill #0

## 2024-10-29 MED FILL — ACETAMINOPHEN 650 MG RE SUPP: 650 mg | RECTAL | Qty: 1 | Fill #0

## 2024-10-29 MED FILL — DIPHENHYDRAMINE HCL 50 MG/ML IJ SOLN: 50 mg/mL | INTRAMUSCULAR | Qty: 1 | Fill #0

## 2024-10-29 MED FILL — NYSTATIN 100000 UNIT/ML MT SUSP: 100000 [IU]/mL | OROMUCOSAL | Qty: 5 | Fill #0

## 2024-10-29 MED FILL — BENADRYL ITCH STOPPING 1-0.1 % EX CREA: 1-0.1 % | CUTANEOUS | Qty: 1 | Fill #0

## 2024-10-29 NOTE — Progress Notes (Signed)
 [place] [facility]  [address] [place], [place] [address]  [phone]      Hospitalist  Progress Note      NAME:       Chelsea Schultz   DOB:        November 23, 1925  MRM:        [id number]    Date of service: 10/29/2024      Subjective: Patient seen and examined by me. Patient admitted with multiple medical issues, now on comfort care measures only. Eating well as per her daughter. Did not sleep as well.      Objective:    Vital Signs:    BP (!) 105/58   Pulse 85   Temp 98.1 F (36.7 C) (Oral)   Resp 16   Ht 1.499 m (4' 11.02)   Wt 44.9 kg (98 lb 15.8 oz)   SpO2 92%   BMI 19.98 kg/m        Intake/Output Summary (Last 24 hours) at 10/29/2024 1101  Last data filed at 10/29/2024 0645  Gross per 24 hour   Intake --   Output 1200 ml   Net -1200 ml        Current inpatient medications reviewed:  Current Facility-Administered Medications   Medication Dose Route Frequency    therapeutic (THERA-DERM) hand/body lotion   Topical PRN    acetaminophen  (TYLENOL ) suppository 650 mg  650 mg Rectal Q6H PRN    diphenhydrAMINE  (BENADRYL ) injection 12.5 mg  12.5 mg IntraVENous Nightly PRN    acetaminophen  (TYLENOL ) tablet 650 mg  650 mg Oral TID    hydrocortisone  (ANUSOL -HC) 2.5 % rectal cream   Rectal TID    carboxymethylcellulose PF (THERATEARS/REFRESH) 1 % ophthalmic gel 1 drop  1 drop Both Eyes TID    lidocaine  (XYLOCAINE ) 5 % ointment   Topical PRN    nystatin  (MYCOSTATIN ) 100000 UNIT/ML suspension 500,000 Units  5 mL Oral 4x Daily    miconazole  (MICOTIN) 2 % vaginal cream 1 g  1 applicator Vaginal Nightly    glycopyrrolate  (ROBINUL ) IntraVENous 0.2 mg  0.2 mg IntraVENous Q4H PRN    ondansetron  (ZOFRAN ) injection 4 mg  4 mg IntraVENous Q6H PRN    LORazepam  (ATIVAN ) 2 MG/ML concentrated solution 0.5 mg  0.5 mg SubLINGual Q2H PRN    HYDROmorphone  HCl PF (DILAUDID ) injection 0.25 mg  0.25 mg IntraVENous Q15 Min PRN    diazePAM  (VALIUM )  injection 2.5 mg  2.5 mg IntraVENous Q30 Min PRN    oxyCODONE  (ROXICODONE ) 5 MG/5ML solution 2.5 mg  2.5 mg Oral Q2H PRN    benzocaine  (ORAJEL) 20 % mucosal gel   Mouth/Throat TID    hydrALAZINE  (APRESOLINE ) injection 10 mg  10 mg IntraVENous Q6H PRN    guaiFENesin  (ROBITUSSIN) 100 MG/5ML liquid 200 mg  200 mg Oral Q4H PRN    ipratropium 0.5 mg-albuterol  2.5 mg (DUONEB ) nebulizer solution 1 Dose  1 Dose Inhalation Q4H PRN    prochlorperazine  (COMPAZINE ) injection 10 mg  10 mg IntraVENous Q6H PRN    sodium chloride  flush 0.9 % injection 5-40 mL  5-40 mL IntraVENous PRN    0.9 % sodium chloride  infusion   IntraVENous PRN    albuterol  (PROVENTIL ) (2.5 MG/3ML) 0.083% nebulizer solution 2.5 mg  2.5 mg Nebulization Q4H PRN     Physical Examination:    General:   Weak and ill looking patient in no acute distress  Eyes:   pink conjunctivae, PERRLA with no discharge.  ENT:  no ottorrhea or rhinorrhea with dry mucous membranes  Pulm:  clear breath sounds without crackles or wheezes  Card:  no JVD or murmurs, has regular and normal S1, S2 without thrills, bruits  Abd:  Soft, non-tender, non-distended, normoactive bowel sounds  Musc:  No cyanosis, clubbing but generalized muscular atrophy   Skin:  No rashes, bruising or ulcers.   Neuro: Awake and alert but tired. No new focal symptoms.   Psych:  Has little insight to her illness     Diagnostic testing:    Laboratory data reviewed and independently interpreted:    No results for input(s): WBC, HGB, HCT, RBC, MCV, MCH, PLT in the last 72 hours.  No results found for: LACTA  No results for input(s): NA, K, CL, CO2, GLUCOSE, BUN, CREATININE, CALCIUM , MG, PHOS, BILITOT, ALKPHOS, AST, ALT, INR in the last 72 hours.    Invalid input(s): PROT, ALB  No components found for: GLUCOSEPOC  Lab Results   Component Value Date/Time    CHOL 142 09/23/2018 02:19 PM    TRIG 73 09/23/2018 02:19 PM    HDL 56 09/23/2018 02:19 PM     Imaging data  reviewed:    No results found.    All other available tests and notes from other care providers have been reviewed by me on: 10/29/2024    Assessment and Plan:    Acute hypoxemic respiratory failure (HCC)   Aspiration pneumonia (HCC)   Pulmonary fibrosis (HCC)   UTI (urinary tract infection)   Type 2 diabetes mellitus with diabetic neuropathy (HCC)    HTN (hypertension), benign    History of cerebrovascular accident    Delirium    Dementia               - she has been reviewed by palliative care and hospice. Continue comfort care measures. Not meeting inpatient hospice               - adjust benadryl  dose PRN at night time. Can use low dose IV Diazepam  PRN sleep.                 - continue comfort feeding               - CM working with family on discharge to SNF with hospice.     Care Plan discussed with: Family, Nursing, CM                 Expected Disposition:  SNF with hospice    PCP:      Gonzella Lorane MATSU, MD     I have personally examined and treated the patient at bedside during this period. To assist coordination of care and communication with nursing and staff, this note may be preliminary early in the day, but finalized by end of the day.         ___________________________________________________    Attending Physician:   Lamar Doom, MD

## 2024-10-29 NOTE — Plan of Care (Signed)
 Problem: Skin/Tissue Integrity  Goal: Skin integrity remains intact  Description: 1.  Monitor for areas of redness and/or skin breakdown  2.  Assess vascular access sites hourly  3.  Every 4-6 hours minimum:  Change oxygen saturation probe site  4.  Every 4-6 hours:  If on nasal continuous positive airway pressure, respiratory therapy assess nares and determine need for appliance change or resting period  Outcome: Progressing     Problem: Safety - Adult  Goal: Free from fall injury  Outcome: Progressing     Problem: Chronic Conditions and Co-morbidities  Goal: Patient's chronic conditions and co-morbidity symptoms are monitored and maintained or improved  Outcome: Progressing     Problem: Discharge Planning  Goal: Discharge to home or other facility with appropriate resources  Outcome: Progressing     Problem: Respiratory - Adult  Goal: Achieves optimal ventilation and oxygenation  Outcome: Progressing     Problem: Confusion  Goal: Confusion, delirium, dementia, or psychosis is improved or at baseline  Description: INTERVENTIONS:  1. Assess for possible contributors to thought disturbance, including medications, impaired vision or hearing, underlying metabolic abnormalities, dehydration, psychiatric diagnoses, and notify attending LIP  2. Institute high risk fall precautions, as indicated  3. Provide frequent short contacts to provide reality reorientation, refocusing and direction  4. Decrease environmental stimuli, including noise as appropriate  5. Monitor and intervene to maintain adequate nutrition, hydration, elimination, sleep and activity  6. If unable to ensure safety without constant attention obtain sitter and review sitter guidelines with assigned personnel  7. Initiate Psychosocial CNS and Spiritual Care consult, as indicated  Outcome: Progressing     Problem: Pain  Goal: Verbalizes/displays adequate comfort level or baseline comfort level  Outcome: Progressing     Problem: Neurosensory - Adult  Goal:  Achieves stable or improved neurological status  Outcome: Progressing  Goal: Achieves maximal functionality and self care  Outcome: Progressing     Problem: Cardiovascular - Adult  Goal: Maintains optimal cardiac output and hemodynamic stability  Outcome: Progressing  Goal: Absence of cardiac dysrhythmias or at baseline  Outcome: Progressing     Problem: Skin/Tissue Integrity - Adult  Goal: Skin integrity remains intact  Description: 1.  Monitor for areas of redness and/or skin breakdown  2.  Assess vascular access sites hourly  3.  Every 4-6 hours minimum:  Change oxygen saturation probe site  4.  Every 4-6 hours:  If on nasal continuous positive airway pressure, respiratory therapy assess nares and determine need for appliance change or resting period  Outcome: Progressing  Goal: Incisions, wounds, or drain sites healing without S/S of infection  Outcome: Progressing  Goal: Oral mucous membranes remain intact  Outcome: Progressing     Problem: Musculoskeletal - Adult  Goal: Return mobility to safest level of function  Outcome: Progressing  Goal: Maintain proper alignment of affected body part  Outcome: Progressing  Goal: Return ADL status to a safe level of function  Outcome: Progressing     Problem: Gastrointestinal - Adult  Goal: Minimal or absence of nausea and vomiting  Outcome: Progressing  Goal: Maintains or returns to baseline bowel function  Outcome: Progressing  Goal: Maintains adequate nutritional intake  Outcome: Progressing     Problem: Genitourinary - Adult  Goal: Absence of urinary retention  Outcome: Progressing     Problem: Infection - Adult  Goal: Absence of infection at discharge  Outcome: Progressing  Goal: Absence of infection during hospitalization  Outcome: Progressing     Problem:  Metabolic/Fluid and Electrolytes - Adult  Goal: Electrolytes maintained within normal limits  Outcome: Progressing  Goal: Hemodynamic stability and optimal renal function maintained  Outcome: Progressing  Goal:  Glucose maintained within prescribed range  Outcome: Progressing     Problem: Hematologic - Adult  Goal: Maintains hematologic stability  Outcome: Progressing     Problem: Safety - Medical Restraint  Goal: Remains free of injury from restraints (Restraint for Interference with Medical Device)  Description: INTERVENTIONS:  1. Determine that other, less restrictive measures have been tried or would not be effective before applying the restraint  2. Evaluate the patient's condition at the time of restraint application  3. Inform patient/family regarding the reason for restraint  4. Q2H: Monitor safety, psychosocial status, comfort, nutrition and hydration  Outcome: Progressing     Problem: ABCDS Injury Assessment  Goal: Absence of physical injury  Outcome: Progressing

## 2024-10-29 NOTE — Progress Notes (Signed)
 Palliative Medicine Social Work Note      Palliative Medicine (NP Franchot, LCSW Reganne Messerschmidt) made supportive visit to pt and family.  Dtr Chelsea Schultz was present at bedside; dtr Chelsea Schultz had just stepped out of the room.  Pt's children were in the midst of a family meeting over FaceTime to discuss pt's care.  Pt was awake in bed, tired but able to engage.  Dtr expressed concern re: pt's upper body itching, questioned source, reports family has been using lotion to bring pt some relief.  Care Manager is working closely with family re: discharge plans, as pt does not currently meet inpt hospice criteria.  Palliative team will continue to follow for support.    Thank you for including Palliative Medicine in the care of Chelsea Schultz.     Chelsea Schultz, [name], ACHP-SW  Palliative Medicine:  [phone])

## 2024-10-29 NOTE — Plan of Care (Signed)
 Problem: Skin/Tissue Integrity  Goal: Skin integrity remains intact  Description: 1.  Monitor for areas of redness and/or skin breakdown  2.  Assess vascular access sites hourly  3.  Every 4-6 hours minimum:  Change oxygen saturation probe site  4.  Every 4-6 hours:  If on nasal continuous positive airway pressure, respiratory therapy assess nares and determine need for appliance change or resting period  10/29/2024 1044 by Othel Ewings, RN  Outcome: Not Progressing  10/29/2024 0106 by Houlihan, Joleen, RN  Outcome: Progressing     Problem: Skin/Tissue Integrity  Goal: Skin integrity remains intact  Description: 1.  Monitor for areas of redness and/or skin breakdown  2.  Assess vascular access sites hourly  3.  Every 4-6 hours minimum:  Change oxygen saturation probe site  4.  Every 4-6 hours:  If on nasal continuous positive airway pressure, respiratory therapy assess nares and determine need for appliance change or resting period  10/29/2024 1044 by Othel Ewings, RN  Outcome: Not Progressing  10/29/2024 0106 by Houlihan, Joleen, RN  Outcome: Progressing     Problem: Safety - Adult  Goal: Free from fall injury  10/29/2024 1044 by Othel Ewings, RN  Outcome: Not Progressing  10/29/2024 0106 by Houlihan, Joleen, RN  Outcome: Progressing     Problem: Chronic Conditions and Co-morbidities  Goal: Patient's chronic conditions and co-morbidity symptoms are monitored and maintained or improved  10/29/2024 1044 by Othel Ewings, RN  Outcome: Not Progressing  10/29/2024 0106 by Houlihan, Joleen, RN  Outcome: Progressing     Problem: Discharge Planning  Goal: Discharge to home or other facility with appropriate resources  10/29/2024 1044 by Othel Ewings, RN  Outcome: Not Progressing  10/29/2024 0106 by Houlihan, Joleen, RN  Outcome: Progressing     Problem: Respiratory - Adult  Goal: Achieves optimal ventilation and oxygenation  10/29/2024 1044 by Othel Ewings, RN  Outcome: Not Progressing  10/29/2024 0106 by Houlihan,  Joleen, RN  Outcome: Progressing     Problem: Confusion  Goal: Confusion, delirium, dementia, or psychosis is improved or at baseline  Description: INTERVENTIONS:  1. Assess for possible contributors to thought disturbance, including medications, impaired vision or hearing, underlying metabolic abnormalities, dehydration, psychiatric diagnoses, and notify attending LIP  2. Institute high risk fall precautions, as indicated  3. Provide frequent short contacts to provide reality reorientation, refocusing and direction  4. Decrease environmental stimuli, including noise as appropriate  5. Monitor and intervene to maintain adequate nutrition, hydration, elimination, sleep and activity  6. If unable to ensure safety without constant attention obtain sitter and review sitter guidelines with assigned personnel  7. Initiate Psychosocial CNS and Spiritual Care consult, as indicated  10/29/2024 1044 by Othel Ewings, RN  Outcome: Not Progressing  10/29/2024 0106 by Houlihan, Joleen, RN  Outcome: Progressing     Problem: Pain  Goal: Verbalizes/displays adequate comfort level or baseline comfort level  10/29/2024 1044 by Othel Ewings, RN  Outcome: Not Progressing  10/29/2024 0106 by Houlihan, Joleen, RN  Outcome: Progressing     Problem: Neurosensory - Adult  Goal: Achieves stable or improved neurological status  10/29/2024 1044 by Othel Ewings, RN  Outcome: Not Progressing  10/29/2024 0106 by Houlihan, Joleen, RN  Outcome: Progressing  Goal: Achieves maximal functionality and self care  10/29/2024 1044 by Othel Ewings, RN  Outcome: Not Progressing  10/29/2024 0106 by Houlihan, Joleen, RN  Outcome: Progressing     Problem: Cardiovascular - Adult  Goal: Maintains optimal cardiac output and hemodynamic  stability  10/29/2024 1044 by Othel Ewings, RN  Outcome: Not Progressing  10/29/2024 0106 by Houlihan, Joleen, RN  Outcome: Progressing  Goal: Absence of cardiac dysrhythmias or at baseline  10/29/2024 1044 by Othel Ewings,  RN  Outcome: Not Progressing  10/29/2024 0106 by Houlihan, Joleen, RN  Outcome: Progressing     Problem: Skin/Tissue Integrity - Adult  Goal: Skin integrity remains intact  Description: 1.  Monitor for areas of redness and/or skin breakdown  2.  Assess vascular access sites hourly  3.  Every 4-6 hours minimum:  Change oxygen saturation probe site  4.  Every 4-6 hours:  If on nasal continuous positive airway pressure, respiratory therapy assess nares and determine need for appliance change or resting period  10/29/2024 1044 by Othel Ewings, RN  Outcome: Not Progressing  10/29/2024 0106 by Houlihan, Joleen, RN  Outcome: Progressing  Goal: Incisions, wounds, or drain sites healing without S/S of infection  10/29/2024 1044 by Othel Ewings, RN  Outcome: Not Progressing  10/29/2024 0106 by Houlihan, Joleen, RN  Outcome: Progressing  Goal: Oral mucous membranes remain intact  10/29/2024 1044 by Othel Ewings, RN  Outcome: Not Progressing  10/29/2024 0106 by Houlihan, Joleen, RN  Outcome: Progressing     Problem: Musculoskeletal - Adult  Goal: Return mobility to safest level of function  10/29/2024 1044 by Othel Ewings, RN  Outcome: Not Progressing  10/29/2024 0106 by Houlihan, Joleen, RN  Outcome: Progressing  Goal: Maintain proper alignment of affected body part  10/29/2024 1044 by Othel Ewings, RN  Outcome: Not Progressing  10/29/2024 0106 by Houlihan, Joleen, RN  Outcome: Progressing  Goal: Return ADL status to a safe level of function  10/29/2024 1044 by Othel Ewings, RN  Outcome: Not Progressing  10/29/2024 0106 by Houlihan, Joleen, RN  Outcome: Progressing     Problem: Gastrointestinal - Adult  Goal: Minimal or absence of nausea and vomiting  10/29/2024 1044 by Othel Ewings, RN  Outcome: Not Progressing  10/29/2024 0106 by Houlihan, Joleen, RN  Outcome: Progressing  Goal: Maintains or returns to baseline bowel function  10/29/2024 1044 by Othel Ewings, RN  Outcome: Not Progressing  10/29/2024 0106 by Houlihan,  Joleen, RN  Outcome: Progressing  Goal: Maintains adequate nutritional intake  10/29/2024 1044 by Othel Ewings, RN  Outcome: Not Progressing  10/29/2024 0106 by Houlihan, Joleen, RN  Outcome: Progressing     Problem: Genitourinary - Adult  Goal: Absence of urinary retention  10/29/2024 1044 by Othel Ewings, RN  Outcome: Not Progressing  10/29/2024 0106 by Houlihan, Joleen, RN  Outcome: Progressing     Problem: Infection - Adult  Goal: Absence of infection at discharge  10/29/2024 1044 by Othel Ewings, RN  Outcome: Not Progressing  10/29/2024 0106 by Houlihan, Joleen, RN  Outcome: Progressing  Goal: Absence of infection during hospitalization  10/29/2024 1044 by Othel Ewings, RN  Outcome: Not Progressing  10/29/2024 0106 by Houlihan, Joleen, RN  Outcome: Progressing     Problem: Metabolic/Fluid and Electrolytes - Adult  Goal: Electrolytes maintained within normal limits  10/29/2024 1044 by Othel Ewings, RN  Outcome: Not Progressing  10/29/2024 0106 by Houlihan, Joleen, RN  Outcome: Progressing  Goal: Hemodynamic stability and optimal renal function maintained  10/29/2024 1044 by Othel Ewings, RN  Outcome: Not Progressing  10/29/2024 0106 by Houlihan, Joleen, RN  Outcome: Progressing  Goal: Glucose maintained within prescribed range  10/29/2024 1044 by Othel Ewings, RN  Outcome: Not Progressing  10/29/2024 0106 by Houlihan, Joleen, RN  Outcome:  Progressing     Problem: Hematologic - Adult  Goal: Maintains hematologic stability  10/29/2024 1044 by Othel Ewings, RN  Outcome: Not Progressing  10/29/2024 0106 by Houlihan, Joleen, RN  Outcome: Progressing     Problem: Safety - Medical Restraint  Goal: Remains free of injury from restraints (Restraint for Interference with Medical Device)  Description: INTERVENTIONS:  1. Determine that other, less restrictive measures have been tried or would not be effective before applying the restraint  2. Evaluate the patient's condition at the time of restraint application  3.  Inform patient/family regarding the reason for restraint  4. Q2H: Monitor safety, psychosocial status, comfort, nutrition and hydration  10/29/2024 1044 by Othel Ewings, RN  Outcome: Not Progressing  10/29/2024 0106 by Houlihan, Joleen, RN  Outcome: Progressing     Problem: ABCDS Injury Assessment  Goal: Absence of physical injury  10/29/2024 1044 by Othel Ewings, RN  Outcome: Not Progressing  10/29/2024 0106 by Houlihan, Joleen, RN  Outcome: Progressing

## 2024-10-29 NOTE — Care Coordination (Signed)
 Case Management Note                CM spoke with daughter Marval [phone] family continue to review SNF  lists and discuss dc plan.           ___________________________________________   [name], RN Case Manager  10/29/2024   4:19 PM

## 2024-10-29 NOTE — Progress Notes (Signed)
 Palliative Medicine  Patient Name: Chelsea Schultz  Date of Birth: 11-24-1925  MRN: [id number]  Age: 89 y.o.  Gender: female    Date of Initial Consult: 10/20/24  Date of Service: 10/29/2024  Time: 3:57 PM  Provider: Franchot Dickens, APRN - [facility] Day: 20  Admit Date: 10/10/2024  Referring Provider:  Dr. Rosalea       Reasons for Consultation:  Other: goals of care, code status    HISTORY OF PRESENT ILLNESS (HPI):   Chelsea Schultz is a 89 y.o. female with a past medical history of interstitial lung disease, prior CVA (2013, 2016), DM, HTN, recurrent UTIs who was admitted on 10/10/2024 from home with productive cough, fatigue for ~1 week. Upon arrival to ED, noted to be febrile and hypoxic requiring supplemental oxygen. Initial chest imaging obtained in ED revealing diffuse interstitial prominence of ground glass opacities suggestive of pulmonary fibrosis vs vs pneumonitis vs pneumonia. Was started on empiric IV antibiotics and admitted for further workup, medical management. Course has been significant for UTI, hospital acquired delirium with waxing/waning mentation. Review indicates fluctuating agitation causing her to self remove supplemental oxygen causing hypoxia, which has prompted several RRTs. Also with increasing lethargy, overall decline, concerns of aspiration with progressive dysphagia. Plan for MBS today. Palliative consulted to assist with goals.       Psychosocial: Six children - 5 living children to include four daughters and one son. Lives with daughter [name], pt has resided with Cathy since 2011. Prior, lived with daughter [name] in [place]. [name] is primary caregiver, does work full time hours at the [employer]. Additionally family has put in place private caregiver M-F 9-5, and 3 evenings a week from 5-9.     Recent baseline prior to admission: obtained from daughters   -Cognitively, able to recognize family members, able to carry on discussion/conversation with daughters and caregivers. Frequently has  disorientation in the evenings where she can become tearful/emotional but often redirectable.   -Functionally, able to stand/pivot and participate in some of her care albeit essentially reliant on all ADLs including bathing, toileting, dressing. Requires meal preparation.     Pt seen today at bedside prior to family meeting with four daughters. Currently resting in bed, on 3L NC. No distress, is confused but redirectable without overt agitation.    -12/30: Seen mid afternoon, dtr Kim at bedside but no other family present at time of visit. [name] was present earlier, [name] coming in around ~5:00-5:30 PM today. Unfortunately with significant decline since this morning. She is much less responsive, does not open eyes to voice. Suspected aspiration event thus reverted back to NPO.   -12/31: Seen midmorning, dtr Luke and dtr [name] at bedside. No significant changes from yesterday as pt remains minimally responsive, family reports she had finally settled after generalized discomfort this morning so did not wake per request. Remains on 2L NC but no distress. No prn medication requirements.   -1/2: Seen midmorning, dtr Jori and Adrien present. Pt resting comfortably on 2L NC. No notable symptoms, appears comfortable. Family states she slept through well through the evening. Feels as if Tylenol  suppositories are helping generalized pain.   1/5: Seen midmorning, comfortable appearing resting in bed. Debra and I review interval events and medication requirements, new orders over the weekend. She shares she has been a bit more awake at times and able to tolerate portions of pureed meals as well as sips of water  and tolerated well thus far. Enjoyed more family visitors  over the weekend. Slept through the night last night comfortably. Has required a few doses of symptom medications albeit not many.   -1/7: Seen mid afternoon, pt more awake in comparison to prior visits. Remains confused, nods to simple yes/no questions. Has  been able to eat small portions of pureed textures for comfort feeding. Has redness on b/l arms and chest without any overt notable rashes or lesions, no drainage. Family not sure if new wipes or come into contact with other new topicals.     PALLIATIVE DIAGNOSES:    Acute hypoxic respiratory failure; multifactorial  Delirium superimposed on baseline dementia  Dysphagia  High risk aspiration   Physical debility, generalized weakness   Goals of care  Advance care planning   Code status discussion     ASSESSMENT AND PLAN:   Chart reviewed prior to seeing pt; including notes, labs, imaging.   Interval events reviewed and discussed with dtr, Adrien, at bedside as well as bedside nursing team, [employer].   Supportive visit today to check in on pt and family. Continuing to work with case management on disposition planning as does not meet criteria for GIP hospice level of care at present. Family having routine meetings on best next steps. Provided support and discussed current medication regimen as below.   Goals/summary: comfort measures, working on disposition planning with case management    Code status --   DNR/DNI  Will benefit from durable prior to discharge   ACP --   Pt adult children as LNOK - all work together in decision making   Symptoms -- minimal symptom requirements thus far - tolerating oral/sublingual.   Anxiety/restlessness/agitation:   Ativan  0.5 mg oral solution every 2 hours prn   Valium  2.5 mg IV every 30 minutes prn   Nonverbal signs of pain:   Will stop scheduled Tylenol  as pt/family prefer PR Tylenol    Oxycodone  2.5 mg oral solution every 2 hours prn   Dilaudid  0.25 mg IV every 15 minutes prn   Pruritus:   Benadryl  25 mg IV nightly prn, family aware of sedating effects but also support this may help with both itching and sleep as did not sleep well last night. Agree with using sparingly as want to balance comfort with anticholinergic effects in geriatric pt   Will add benadryl  cream prn per family  request as well   Would avoid CHG containing wipes   Monitor closely for other adverse signs/symptoms   Initial consult note routed to primary continuity provider and/or primary health care team members  Discussed with Dr. Salem   Please call with any palliative questions or concerns.  Palliative Care Team is available via perfect serve or via phone.    Referrals to:   []  Outpatient Palliative Care  []  Home Based Palliative Care  []  Home Based Primary Care  [x]  Hospice       ADVANCE CARE PLANNING:   []  The Pall Med Interdisciplinary Team has updated the ACP Navigator with Health Care Decision Maker and Patient Capacity      Primary Decision Maker: Santia, Labate - Child [phone]    Primary Decision Maker: Manya Notice - Child [phone]  Confirm Advance Directive: None  Patient Would Like to Complete Advance Directive: Unable    Current Code Status: DNR     Goals of Care: Goals of Care and Interventions  Patient/Health Care Proxy Stated Goals: Recovery from acute illness  Medical Interventions: Full interventions  Life Goals  Patient and Family Personal Goals:  continue current measures, hopeful for recovery    Please refer to Palliative Medicine ACP notes for further details.    PALLIATIVE ASSESSMENT:      Palliative Performance Scale (PPS):  PPS: 30    ECOG:        Modified ESAS:  Modified-[place] Symptom Assessment Scale (ESAS)  Pain Score: No pain  Dyspnea Score: No shortness of breath    Clinical Pain Assessment (nonverbal scale for severity on nonverbal patients):   Clinical Pain Assessment  Severity: 0  Location: UTA  Character: UTA  Duration: UTA  Factors: UTA  Frequency: UTA       NVPS:  Adult Nonverbal Pain Scale (NVPS)  Face: No particular expression or smile  Activity (Movement): Laid quietly, normal position  Guarding: Lying quietly, no positioning of hands over areas of bod  Physiology (Vital Signs): Stable vital signs  Respiratory: Baseline RR/SpO2 compliant with ventilator  NVPS  Score : 0    RDOS:  RDOS  Heart rate per minute: less than 90  Respiratory Rate per Minute: less than 19  Restlessness:non-purposeful: None  Paradoxical breathing pattern:abdomen moves in on inspiration: None  Accessory muscle use: rise in clavicle during inspiration: None  Grunting at end-expiration: guttural sound: None  Nasal flaring: involuntary movement of nares: None  Total : 0      Vital Signs: Blood pressure (!) 105/58, pulse 85, temperature 98.1 F (36.7 C), temperature source Oral, resp. rate 16, height 1.499 m (4' 11.02), weight 44.9 kg (98 lb 15.8 oz), SpO2 92%.    PHYSICAL ASSESSMENT:   General: []  Oriented x3  []  Well appearing  []  Intubated  [x] Ill appearing  [] Other: minimally responsive   Mental Status: []  Normal mental status exam  []  Drowsy  []  Confused  [] Other:  Cardiovascular: []  Regular rate/rhythm  []  Arrhythmia  [x]  Other: minimally responsive   Chest: [x]  Effort normal  [] Lungs clear  []  Respiratory distress  [] Tachypnea  []  Other: 2L NC  Abdomen: []  Soft/non-tender  []  Normal appearance  []  Distended  []  Ascites  []  Other:  Neurological: []  Normal speech  []  Normal sensation  [x] Deficits present: minimally responsive, unable to follow commands, does not open eyes to voice   Extremity: []  Normal skin color/temp  []  Clubbing/cyanosis  []  No edema  [x]  Other: debilitated appearing, temporal muscle wasting noted     Wt Readings from Last 15 Encounters:   10/17/24 44.9 kg (98 lb 15.8 oz)   06/26/23 40.8 kg (90 lb)   06/19/22 49.9 kg (110 lb)   02/24/22 48.8 kg (107 lb 9.6 oz)   12/29/21 43.5 kg (96 lb)        Current Diet: ADULT DIET; Dysphagia - Pureed       PSYCHOSOCIAL/SPIRITUAL SCREENING:   Palliative IDT has assessed this patient for cultural preferences / practices and a referral made as appropriate to needs Gaffer, Patient Advocacy, Ethics, etc.)    Spiritual Affiliation: Non-Denominational    Any spiritual / religious concerns:  []  Yes /  [x]  No   If Yes to discuss with  pastoral care during IDT     Does caregiver feel burdened by caring for their loved one:   []  Yes /  [x]  No /  []  No Caregiver Present/Available []  No Caregiver []  Pt Lives at Facility  If Yes to discuss with social work during IDT    Anticipatory grief assessment:   [x]  Normal  / []  Maladaptive     If Maladaptive to  discuss with social work during IDT    ESAS Anxiety:      ESAS Depression:          LAB AND IMAGING FINDINGS:   Objective data reviewed:  labs, images, records, medication use, vitals, and chart     FINAL COMMENTS   Thank you for allowing Palliative Medicine to participate in the care of Chelsea Schultz.    Only check if applicable and billing time based rather than MDM  []  The total encounter time on this service date was __35__ minutes which was spent performing a face-to-face encounter and personally completing the provider-level activities documented in the note. This includes time spent prior to the visit and after the visit in direct care of the patient. This time does not include time spent in any separately reportable services.    Electronically signed by   Franchot Dickens, APRN - CNP  Palliative Care Team  on 10/29/2024 at 3:57 PM

## 2024-10-30 MED ORDER — STERILE WATER FOR INJECTION (MIXTURES ONLY)
40 | Freq: Once | INTRAMUSCULAR | Status: DC
Start: 2024-10-30 — End: 2024-10-30

## 2024-10-30 MED ORDER — HYDROMORPHONE HCL PF 1 MG/ML IJ SOLN
1 | INTRAMUSCULAR | Status: DC | PRN
Start: 2024-10-30 — End: 2024-11-03
  Administered 2024-11-02 – 2024-11-03 (×4): 0.5 mg via INTRAVENOUS

## 2024-10-30 MED ORDER — STERILE WATER FOR INJECTION (MIXTURES ONLY)
40 | Freq: Once | INTRAMUSCULAR | Status: AC
Start: 2024-10-30 — End: 2024-10-30
  Administered 2024-10-30: 20:00:00 40 mg via INTRAVENOUS

## 2024-10-30 MED FILL — NYSTATIN 100000 UNIT/ML MT SUSP: 100000 [IU]/mL | OROMUCOSAL | Qty: 5 | Fill #0

## 2024-10-30 MED FILL — ACETAMINOPHEN 650 MG RE SUPP: 650 mg | RECTAL | Qty: 1 | Fill #0

## 2024-10-30 MED FILL — REFRESH CELLUVISC 1 % OP GEL: 1 % | OPHTHALMIC | Qty: 1 | Fill #0

## 2024-10-30 MED FILL — LORAZEPAM 2 MG/ML PO CONC: 2 mg/mL | ORAL | Qty: 0.5 | Fill #0

## 2024-10-30 MED FILL — DIAZEPAM 5 MG/ML IJ SOLN: 5 mg/mL | INTRAMUSCULAR | Qty: 2 | Fill #0

## 2024-10-30 MED FILL — REFRESH CELLUVISC 1 % OP GEL: 1 % | OPHTHALMIC | Qty: 0.13 | Fill #0

## 2024-10-30 MED FILL — METHYLPREDNISOLONE SODIUM SUCC 40 MG IJ SOLR: 40 mg | INTRAMUSCULAR | Qty: 40 | Fill #0

## 2024-10-30 MED FILL — DIPHENHYDRAMINE HCL 50 MG/ML IJ SOLN: 50 mg/mL | INTRAMUSCULAR | Qty: 1 | Fill #0

## 2024-10-30 NOTE — Plan of Care (Signed)
 Problem: Skin/Tissue Integrity  Goal: Skin integrity remains intact  Description: 1.  Monitor for areas of redness and/or skin breakdown  2.  Assess vascular access sites hourly  3.  Every 4-6 hours minimum:  Change oxygen saturation probe site  4.  Every 4-6 hours:  If on nasal continuous positive airway pressure, respiratory therapy assess nares and determine need for appliance change or resting period  10/30/2024 0928 by Othel Ewings, RN  Outcome: Progressing  10/30/2024 0928 by Othel Ewings, RN  Outcome: Not Progressing  10/30/2024 0337 by Timmie Pica, RN  Outcome: Progressing     Problem: Skin/Tissue Integrity  Goal: Skin integrity remains intact  Description: 1.  Monitor for areas of redness and/or skin breakdown  2.  Assess vascular access sites hourly  3.  Every 4-6 hours minimum:  Change oxygen saturation probe site  4.  Every 4-6 hours:  If on nasal continuous positive airway pressure, respiratory therapy assess nares and determine need for appliance change or resting period  10/30/2024 0928 by Othel Ewings, RN  Outcome: Progressing  10/30/2024 0928 by Othel Ewings, RN  Outcome: Not Progressing  10/30/2024 0337 by Timmie Pica, RN  Outcome: Progressing     Problem: Safety - Adult  Goal: Free from fall injury  10/30/2024 0928 by Othel Ewings, RN  Outcome: Progressing  10/30/2024 0928 by Othel Ewings, RN  Outcome: Not Progressing  10/30/2024 0337 by Timmie Pica, RN  Outcome: Progressing     Problem: Chronic Conditions and Co-morbidities  Goal: Patient's chronic conditions and co-morbidity symptoms are monitored and maintained or improved  10/30/2024 0928 by Othel Ewings, RN  Outcome: Progressing  10/30/2024 0928 by Othel Ewings, RN  Outcome: Not Progressing  10/30/2024 0337 by Timmie Pica, RN  Outcome: Progressing     Problem: Discharge Planning  Goal: Discharge to home or other facility with appropriate resources  10/30/2024 0928 by Othel Ewings, RN  Outcome: Progressing  10/30/2024  0928 by Othel Ewings, RN  Outcome: Not Progressing  10/30/2024 0337 by Timmie Pica, RN  Outcome: Progressing     Problem: Respiratory - Adult  Goal: Achieves optimal ventilation and oxygenation  10/30/2024 0928 by Othel Ewings, RN  Outcome: Progressing  10/30/2024 0928 by Othel Ewings, RN  Outcome: Not Progressing  10/30/2024 0337 by Timmie Pica, RN  Outcome: Progressing     Problem: Confusion  Goal: Confusion, delirium, dementia, or psychosis is improved or at baseline  Description: INTERVENTIONS:  1. Assess for possible contributors to thought disturbance, including medications, impaired vision or hearing, underlying metabolic abnormalities, dehydration, psychiatric diagnoses, and notify attending LIP  2. Institute high risk fall precautions, as indicated  3. Provide frequent short contacts to provide reality reorientation, refocusing and direction  4. Decrease environmental stimuli, including noise as appropriate  5. Monitor and intervene to maintain adequate nutrition, hydration, elimination, sleep and activity  6. If unable to ensure safety without constant attention obtain sitter and review sitter guidelines with assigned personnel  7. Initiate Psychosocial CNS and Spiritual Care consult, as indicated  10/30/2024 0928 by Othel Ewings, RN  Outcome: Progressing  10/30/2024 0928 by Othel Ewings, RN  Outcome: Not Progressing  10/30/2024 0337 by Timmie Pica, RN  Outcome: Progressing     Problem: Pain  Goal: Verbalizes/displays adequate comfort level or baseline comfort level  10/30/2024 0928 by Othel Ewings, RN  Outcome: Progressing  10/30/2024 0928 by Othel Ewings, RN  Outcome: Not Progressing  10/30/2024 0337 by Timmie Pica, RN  Outcome: Progressing  Problem: Neurosensory - Adult  Goal: Achieves stable or improved neurological status  10/30/2024 0928 by Othel Ewings, RN  Outcome: Progressing  10/30/2024 0928 by Othel Ewings, RN  Outcome: Not Progressing  10/30/2024 0337 by Timmie Pica,  RN  Outcome: Progressing  Goal: Achieves maximal functionality and self care  10/30/2024 0928 by Othel Ewings, RN  Outcome: Progressing  10/30/2024 0928 by Othel Ewings, RN  Outcome: Not Progressing  10/30/2024 0337 by Timmie Pica, RN  Outcome: Progressing     Problem: Cardiovascular - Adult  Goal: Maintains optimal cardiac output and hemodynamic stability  10/30/2024 0928 by Othel Ewings, RN  Outcome: Progressing  10/30/2024 0928 by Othel Ewings, RN  Outcome: Not Progressing  10/30/2024 0337 by Timmie Pica, RN  Outcome: Progressing  Goal: Absence of cardiac dysrhythmias or at baseline  10/30/2024 0928 by Othel Ewings, RN  Outcome: Progressing  10/30/2024 0928 by Othel Ewings, RN  Outcome: Not Progressing  10/30/2024 0337 by Timmie Pica, RN  Outcome: Progressing     Problem: Skin/Tissue Integrity - Adult  Goal: Skin integrity remains intact  Description: 1.  Monitor for areas of redness and/or skin breakdown  2.  Assess vascular access sites hourly  3.  Every 4-6 hours minimum:  Change oxygen saturation probe site  4.  Every 4-6 hours:  If on nasal continuous positive airway pressure, respiratory therapy assess nares and determine need for appliance change or resting period  10/30/2024 0928 by Othel Ewings, RN  Outcome: Progressing  10/30/2024 0928 by Othel Ewings, RN  Outcome: Not Progressing  10/30/2024 0337 by Timmie Pica, RN  Outcome: Progressing  Goal: Incisions, wounds, or drain sites healing without S/S of infection  10/30/2024 0928 by Othel Ewings, RN  Outcome: Progressing  10/30/2024 0928 by Othel Ewings, RN  Outcome: Not Progressing  10/30/2024 0337 by Timmie Pica, RN  Outcome: Progressing  Goal: Oral mucous membranes remain intact  10/30/2024 0928 by Othel Ewings, RN  Outcome: Progressing  10/30/2024 0928 by Othel Ewings, RN  Outcome: Not Progressing  10/30/2024 0337 by Timmie Pica, RN  Outcome: Progressing     Problem: Musculoskeletal - Adult  Goal: Return mobility to safest  level of function  10/30/2024 0928 by Othel Ewings, RN  Outcome: Progressing  10/30/2024 0928 by Othel Ewings, RN  Outcome: Not Progressing  10/30/2024 0337 by Timmie Pica, RN  Outcome: Progressing  Goal: Maintain proper alignment of affected body part  10/30/2024 0928 by Othel Ewings, RN  Outcome: Progressing  10/30/2024 0928 by Othel Ewings, RN  Outcome: Not Progressing  10/30/2024 0337 by Timmie Pica, RN  Outcome: Progressing  Goal: Return ADL status to a safe level of function  10/30/2024 0928 by Othel Ewings, RN  Outcome: Progressing  10/30/2024 0928 by Othel Ewings, RN  Outcome: Not Progressing  10/30/2024 0337 by Timmie Pica, RN  Outcome: Progressing     Problem: Gastrointestinal - Adult  Goal: Minimal or absence of nausea and vomiting  10/30/2024 0928 by Othel Ewings, RN  Outcome: Progressing  10/30/2024 0928 by Othel Ewings, RN  Outcome: Not Progressing  10/30/2024 0337 by Timmie Pica, RN  Outcome: Progressing  Goal: Maintains or returns to baseline bowel function  10/30/2024 0928 by Othel Ewings, RN  Outcome: Progressing  10/30/2024 0928 by Othel Ewings, RN  Outcome: Not Progressing  10/30/2024 0337 by Timmie Pica, RN  Outcome: Progressing  Goal: Maintains adequate nutritional intake  10/30/2024 0928 by Othel Ewings, RN  Outcome: Progressing  10/30/2024 0928 by Othel Ewings, RN  Outcome: Not Progressing  10/30/2024 0337 by Timmie Pica, RN  Outcome: Progressing     Problem: Genitourinary - Adult  Goal: Absence of urinary retention  10/30/2024 0928 by Othel Ewings, RN  Outcome: Progressing  10/30/2024 0928 by Othel Ewings, RN  Outcome: Not Progressing  10/30/2024 0337 by Timmie Pica, RN  Outcome: Progressing     Problem: Infection - Adult  Goal: Absence of infection at discharge  10/30/2024 0928 by Othel Ewings, RN  Outcome: Progressing  10/30/2024 0928 by Othel Ewings, RN  Outcome: Not Progressing  10/30/2024 0337 by Timmie Pica, RN  Outcome: Progressing  Goal:  Absence of infection during hospitalization  10/30/2024 0928 by Othel Ewings, RN  Outcome: Progressing  10/30/2024 0928 by Othel Ewings, RN  Outcome: Not Progressing  10/30/2024 0337 by Timmie Pica, RN  Outcome: Progressing     Problem: Metabolic/Fluid and Electrolytes - Adult  Goal: Electrolytes maintained within normal limits  10/30/2024 0928 by Othel Ewings, RN  Outcome: Progressing  10/30/2024 0928 by Othel Ewings, RN  Outcome: Not Progressing  10/30/2024 0337 by Timmie Pica, RN  Outcome: Progressing  Goal: Hemodynamic stability and optimal renal function maintained  10/30/2024 0928 by Othel Ewings, RN  Outcome: Progressing  10/30/2024 0928 by Othel Ewings, RN  Outcome: Not Progressing  10/30/2024 0337 by Timmie Pica, RN  Outcome: Progressing  Goal: Glucose maintained within prescribed range  10/30/2024 0928 by Othel Ewings, RN  Outcome: Progressing  10/30/2024 0928 by Othel Ewings, RN  Outcome: Not Progressing  10/30/2024 0337 by Timmie Pica, RN  Outcome: Progressing     Problem: Hematologic - Adult  Goal: Maintains hematologic stability  10/30/2024 0928 by Othel Ewings, RN  Outcome: Progressing  10/30/2024 0928 by Othel Ewings, RN  Outcome: Not Progressing  10/30/2024 0337 by Timmie Pica, RN  Outcome: Progressing     Problem: Safety - Medical Restraint  Goal: Remains free of injury from restraints (Restraint for Interference with Medical Device)  Description: INTERVENTIONS:  1. Determine that other, less restrictive measures have been tried or would not be effective before applying the restraint  2. Evaluate the patient's condition at the time of restraint application  3. Inform patient/family regarding the reason for restraint  4. Q2H: Monitor safety, psychosocial status, comfort, nutrition and hydration  10/30/2024 0928 by Othel Ewings, RN  Outcome: Progressing  10/30/2024 0928 by Othel Ewings, RN  Outcome: Not Progressing  10/30/2024 0337 by Timmie Pica, RN  Outcome:  Progressing     Problem: ABCDS Injury Assessment  Goal: Absence of physical injury  10/30/2024 0928 by Othel Ewings, RN  Outcome: Progressing  10/30/2024 0928 by Othel Ewings, RN  Outcome: Not Progressing  10/30/2024 0337 by Timmie Pica, RN  Outcome: Progressing

## 2024-10-30 NOTE — Plan of Care (Signed)
 "  Problem: Skin/Tissue Integrity  Goal: Skin integrity remains intact  Description: 1.  Monitor for areas of redness and/or skin breakdown  2.  Assess vascular access sites hourly  3.  Every 4-6 hours minimum:  Change oxygen saturation probe site  4.  Every 4-6 hours:  If on nasal continuous positive airway pressure, respiratory therapy assess nares and determine need for appliance change or resting period  Outcome: Progressing     Problem: Safety - Adult  Goal: Free from fall injury  Outcome: Progressing     Problem: Chronic Conditions and Co-morbidities  Goal: Patient's chronic conditions and co-morbidity symptoms are monitored and maintained or improved  Outcome: Progressing     Problem: Discharge Planning  Goal: Discharge to home or other facility with appropriate resources  Outcome: Progressing     Problem: Respiratory - Adult  Goal: Achieves optimal ventilation and oxygenation  Outcome: Progressing     Problem: Neurosensory - Adult  Goal: Achieves stable or improved neurological status  Outcome: Progressing  Goal: Achieves maximal functionality and self care  Outcome: Progressing     Problem: Cardiovascular - Adult  Goal: Maintains optimal cardiac output and hemodynamic stability  Outcome: Progressing  Goal: Absence of cardiac dysrhythmias or at baseline  Outcome: Progressing     Problem: Skin/Tissue Integrity - Adult  Goal: Skin integrity remains intact  Description: 1.  Monitor for areas of redness and/or skin breakdown  2.  Assess vascular access sites hourly  3.  Every 4-6 hours minimum:  Change oxygen saturation probe site  4.  Every 4-6 hours:  If on nasal continuous positive airway pressure, respiratory therapy assess nares and determine need for appliance change or resting period  Outcome: Progressing  Goal: Incisions, wounds, or drain sites healing without S/S of infection  Outcome: Progressing  Goal: Oral mucous membranes remain intact  Outcome: Progressing     Problem: Musculoskeletal - Adult  Goal:  Return mobility to safest level of function  Outcome: Progressing  Goal: Maintain proper alignment of affected body part  Outcome: Progressing  Goal: Return ADL status to a safe level of function  Outcome: Progressing     Problem: Gastrointestinal - Adult  Goal: Minimal or absence of nausea and vomiting  Outcome: Progressing  Goal: Maintains or returns to baseline bowel function  Outcome: Progressing  Goal: Maintains adequate nutritional intake  Outcome: Progressing     Problem: Genitourinary - Adult  Goal: Absence of urinary retention  Outcome: Progressing     Problem: Infection - Adult  Goal: Absence of infection at discharge  Outcome: Progressing  Goal: Absence of infection during hospitalization  Outcome: Progressing     Problem: Metabolic/Fluid and Electrolytes - Adult  Goal: Electrolytes maintained within normal limits  Outcome: Progressing  Goal: Hemodynamic stability and optimal renal function maintained  Outcome: Progressing  Goal: Glucose maintained within prescribed range  Outcome: Progressing     Problem: Hematologic - Adult  Goal: Maintains hematologic stability  Outcome: Progressing     Problem: Confusion  Goal: Confusion, delirium, dementia, or psychosis is improved or at baseline  Description: INTERVENTIONS:  1. Assess for possible contributors to thought disturbance, including medications, impaired vision or hearing, underlying metabolic abnormalities, dehydration, psychiatric diagnoses, and notify attending LIP  2. Institute high risk fall precautions, as indicated  3. Provide frequent short contacts to provide reality reorientation, refocusing and direction  4. Decrease environmental stimuli, including noise as appropriate  5. Monitor and intervene to maintain adequate nutrition, hydration, elimination,  sleep and activity  6. If unable to ensure safety without constant attention obtain sitter and review sitter guidelines with assigned personnel  7. Initiate Psychosocial CNS and Spiritual Care  consult, as indicated  Outcome: Progressing     Problem: Pain  Goal: Verbalizes/displays adequate comfort level or baseline comfort level  Outcome: Progressing     Problem: Safety - Medical Restraint  Goal: Remains free of injury from restraints (Restraint for Interference with Medical Device)  Description: INTERVENTIONS:  1. Determine that other, less restrictive measures have been tried or would not be effective before applying the restraint  2. Evaluate the patient's condition at the time of restraint application  3. Inform patient/family regarding the reason for restraint  4. Q2H: Monitor safety, psychosocial status, comfort, nutrition and hydration  Outcome: Progressing     Problem: ABCDS Injury Assessment  Goal: Absence of physical injury  Outcome: Progressing     "

## 2024-10-30 NOTE — Progress Notes (Signed)
 Palliative Medicine  Patient Name: Chelsea Schultz  Date of Birth: 07-Jul-1926  MRN: [id number]  Age: 89 y.o.  Gender: female    Date of Initial Consult: 10/20/24  Date of Service: 10/30/2024  Time: 2:19 PM  Provider: Franchot Dickens, APRN - [facility] Day: 21  Admit Date: 10/10/2024  Referring Provider:  Dr. Rosalea       Reasons for Consultation:  Other: goals of care, code status    HISTORY OF PRESENT ILLNESS (HPI):   Chelsea Schultz is a 89 y.o. female with a past medical history of interstitial lung disease, prior CVA (2013, 2016), DM, HTN, recurrent UTIs who was admitted on 10/10/2024 from home with productive cough, fatigue for ~1 week. Upon arrival to ED, noted to be febrile and hypoxic requiring supplemental oxygen. Initial chest imaging obtained in ED revealing diffuse interstitial prominence of ground glass opacities suggestive of pulmonary fibrosis vs vs pneumonitis vs pneumonia. Was started on empiric IV antibiotics and admitted for further workup, medical management. Course has been significant for UTI, hospital acquired delirium with waxing/waning mentation. Review indicates fluctuating agitation causing her to self remove supplemental oxygen causing hypoxia, which has prompted several RRTs. Also with increasing lethargy, overall decline, concerns of aspiration with progressive dysphagia. Plan for MBS today. Palliative consulted to assist with goals.       Psychosocial: Six children - 5 living children to include four daughters and one son. Lives with daughter Chelsea Schultz, pt has resided with Chelsea Schultz since 2011. Prior, lived with daughter Chelsea Schultz in . Chelsea Schultz is primary caregiver, does work full time hours at the [employer]. Additionally family has put in place private caregiver M-F 9-5, and 3 evenings a week from 5-9.     Recent baseline prior to admission: obtained from daughters   -Cognitively, able to recognize family members, able to carry on discussion/conversation with daughters and caregivers. Frequently has  disorientation in the evenings where she can become tearful/emotional but often redirectable.   -Functionally, able to stand/pivot and participate in some of her care albeit essentially reliant on all ADLs including bathing, toileting, dressing. Requires meal preparation.     Pt seen today at bedside prior to family meeting with four daughters. Currently resting in bed, on 3L NC. No distress, is confused but redirectable without overt agitation.    -12/30: Seen mid afternoon, dtr Chelsea Schultz at bedside but no other family present at time of visit. Chelsea Schultz was present earlier, Chelsea Schultz coming in around ~5:00-5:30 PM today. Unfortunately with significant decline since this morning. She is much less responsive, does not open eyes to voice. Suspected aspiration event thus reverted back to NPO.   -12/31: Seen midmorning, dtr Chelsea Schultz and dtr Chelsea Schultz at bedside. No significant changes from yesterday as pt remains minimally responsive, family reports she had finally settled after generalized discomfort this morning so did not wake per request. Remains on 2L NC but no distress. No prn medication requirements.   -1/2: Seen midmorning, dtr Chelsea Schultz and Chelsea Schultz present. Pt resting comfortably on 2L NC. No notable symptoms, appears comfortable. Family states she slept through well through the evening. Feels as if Tylenol  suppositories are helping generalized pain.   1/5: Seen midmorning, comfortable appearing resting in bed. Chelsea Schultz and I review interval events and medication requirements, new orders over the weekend. She shares she has been a bit more awake at times and able to tolerate portions of pureed meals as well as sips of water  and tolerated well thus far. Enjoyed more family visitors  over the weekend. Slept through the night last night comfortably. Has required a few doses of symptom medications albeit not many.   -1/7: Seen mid afternoon, pt more awake in comparison to prior visits. Remains confused, nods to simple yes/no questions. Has  been able to eat small portions of pureed textures for comfort feeding. Has redness on b/l arms and chest without any overt notable rashes or lesions, no drainage. Family not sure if new wipes or come into contact with other new topicals.   -1/8: Seen early afternoon, dtrs Chelsea Schultz and Chelsea Schultz present. Pt just cleaned up by nursing. Quite restless today, itching around her neck and arms, still with redness but redness improved from yesterday. Still no new lesions or drainage noticed. Turning becoming more painful to pt per nursing. Ate some bites of pureed texture dinner last night but otherwise PO intake much less. Had a restlessness night, asking for God to take her. Dtr states Dilaudid  dose was not very helpful last night in alleviating symptoms.      PALLIATIVE DIAGNOSES:    Acute hypoxic respiratory failure; multifactorial  Delirium superimposed on baseline dementia  Dysphagia  High risk aspiration   Physical debility, generalized weakness   Goals of care  Advance care planning   Code status discussion     ASSESSMENT AND PLAN:   Chart reviewed prior to seeing pt; including notes, labs, imaging.   Interval events reviewed and discussed with dtrs Chelsea Schultz and Chelsea Schultz at bedside as well as bedside nursing team, Multimedia Programmer  Goals --   Continue to be clear for comfort focused care, managing symptoms as working on assessing symptoms, also monitoring her needs and assessing hospice placement.   Family aware not meeting criteria for inpatient at this time, albeit does seem more lethargic and symptomatic today. Family also shares this.   They are aware of need to continue to work with case management on planning whilst we continue to monitor her. Both Chelsea Schultz and [name] share they understand the necessity of this. State they are working with CM. Voice their strong faith and believe God has a plan.   Spoke with [facility] liaison Chelsea Schultz outside of the room as well - provided updates and stated she will be available to  circle back and reevaluate her to keep family in loop on where she stands from their hospice point of view.   Continue comfort measures, managing symptoms, continuing to work on dispo plan and assessing her symptom needs.   Code status --   DNR/DNI  Will benefit from durable prior to discharge   ACP --   Pt adult children as LNOK - all work together in decision making   Symptoms --   Discussion with Chelsea Schultz and Chelsea Schultz in regard to noted increasing symptom burden. More lethargic, increasing signs of pain and restlessness. Was open and honest that we are likely needing something more beneficial than Tylenol  PR, and also frequent turning challenging for pt. Review balance of turning for comfort and avoiding skin breakdown but also recognizing her pain along with this.   With more lethargic and increasing symptom burden - will continue to utilize small volume SL and also during my assessment, symptoms significant and requiring IV. Family agreeable.   Anxiety/restlessness/agitation:   Ativan  0.5 mg oral solution every 2 hours prn   Valium  2.5 mg IV every 30 minutes prn   Nonverbal signs of pain:   Will stop scheduled Tylenol  as pt/family prefer PR Tylenol , continue as needed  Oxycodone  2.5 mg oral solution every 2 hours prn   Dilaudid  0.5 mg IV every 15 minutes prn   Pruritus:   Unclear cause - discussed with Dr. Salem, reviewing medications. Noticed roughly within the last 48 hours, do not see any new medications for her around this time.   One time dose IV solumedrol today   Benadryl  25 mg IV nightly prn   Will add benadryl  cream prn per family request as well   Would avoid CHG containing wipes   Monitor closely for other adverse signs/symptoms   Initial consult note routed to primary continuity provider and/or primary health care team members  Discussed with Dr. Salem, RN Catarina, and [facility] RN Robin   Please call with any palliative questions or concerns.  Palliative Care Team is available via perfect  serve or via phone.    Referrals to:   []  Outpatient Palliative Care  []  Home Based Palliative Care  []  Home Based Primary Care  [x]  Hospice       ADVANCE CARE PLANNING:   []  The Pall Med Interdisciplinary Team has updated the ACP Navigator with Health Care Decision Maker and Patient Capacity      Primary Decision Maker: Chelsea Schultz, Chelsea Schultz - Child [phone]    Primary Decision Maker: Chelsea Schultz - Child [phone]  Confirm Advance Directive: None  Patient Would Like to Complete Advance Directive: Unable    Current Code Status: DNR     Goals of Care: Goals of Care and Interventions  Patient/Health Care Proxy Stated Goals: Recovery from acute illness  Medical Interventions: Full interventions  Life Goals  Patient and Family Personal Goals: continue current measures, hopeful for recovery    Please refer to Palliative Medicine ACP notes for further details.    PALLIATIVE ASSESSMENT:      Palliative Performance Scale (PPS):  PPS: 30    ECOG:        Modified ESAS:  Modified-[place] Symptom Assessment Scale (ESAS)  Pain Score: No pain  Dyspnea Score: No shortness of breath    Clinical Pain Assessment (nonverbal scale for severity on nonverbal patients):   Clinical Pain Assessment  Severity: 0  Location: UTA  Character: UTA  Duration: UTA  Factors: UTA  Frequency: UTA       NVPS:  Adult Nonverbal Pain Scale (NVPS)  Face: No particular expression or smile  Activity (Movement): Laid quietly, normal position  Guarding: Lying quietly, no positioning of hands over areas of bod  Physiology (Vital Signs): Stable vital signs  Respiratory: Baseline RR/SpO2 compliant with ventilator  NVPS Score : 0    RDOS:  RDOS  Heart rate per minute: less than 90  Respiratory Rate per Minute: less than 19  Restlessness:non-purposeful: None  Paradoxical breathing pattern:abdomen moves in on inspiration: None  Accessory muscle use: rise in clavicle during inspiration: None  Grunting at end-expiration: guttural sound: None  Nasal  flaring: involuntary movement of nares: None  Total : 0      Vital Signs: Blood pressure (!) 122/52, pulse 83, temperature 98.2 F (36.8 C), temperature source Axillary, resp. rate 16, height 1.499 m (4' 11.02), weight 44.9 kg (98 lb 15.8 oz), SpO2 93%.    PHYSICAL ASSESSMENT:   General: []  Oriented x3  []  Well appearing  []  Intubated  [x] Ill appearing  [] Other: minimally responsive, a bit restless   Mental Status: []  Normal mental status exam  []  Drowsy  []  Confused  [] Other:  Cardiovascular: []  Regular rate/rhythm  []  Arrhythmia  [x]   Other: minimally responsive   Chest: [x]  Effort normal  [] Lungs clear  []  Respiratory distress  [] Tachypnea  []  Other: 2L NC  Abdomen: []  Soft/non-tender  []  Normal appearance  []  Distended  []  Ascites  []  Other:  Neurological: []  Normal speech  []  Normal sensation  [x] Deficits present: minimally responsive   Extremity: []  Normal skin color/temp  []  Clubbing/cyanosis  []  No edema  [x]  Other: debilitated appearing, temporal muscle wasting noted     Wt Readings from Last 15 Encounters:   10/17/24 44.9 kg (98 lb 15.8 oz)   06/26/23 40.8 kg (90 lb)   06/19/22 49.9 kg (110 lb)   02/24/22 48.8 kg (107 lb 9.6 oz)   12/29/21 43.5 kg (96 lb)        Current Diet: ADULT DIET; Dysphagia - Pureed       PSYCHOSOCIAL/SPIRITUAL SCREENING:   Palliative IDT has assessed this patient for cultural preferences / practices and a referral made as appropriate to needs Gaffer, Patient Advocacy, Ethics, etc.)    Spiritual Affiliation: Non-Denominational    Any spiritual / religious concerns:  []  Yes /  [x]  No   If Yes to discuss with pastoral care during IDT     Does caregiver feel burdened by caring for their loved one:   []  Yes /  [x]  No /  []  No Caregiver Present/Available []  No Caregiver []  Pt Lives at Facility  If Yes to discuss with social work during IDT    Anticipatory grief assessment:   [x]  Normal  / []  Maladaptive     If Maladaptive to discuss with social work during IDT    ESAS  Anxiety:      ESAS Depression:          LAB AND IMAGING FINDINGS:   Objective data reviewed:  labs, images, records, medication use, vitals, and chart     FINAL COMMENTS   Thank you for allowing Palliative Medicine to participate in the care of Leonie VEAR Pouch.    Only check if applicable and billing time based rather than MDM  []  The total encounter time on this service date was __35__ minutes which was spent performing a face-to-face encounter and personally completing the provider-level activities documented in the note. This includes time spent prior to the visit and after the visit in direct care of the patient. This time does not include time spent in any separately reportable services.    Electronically signed by   Franchot Dickens, APRN - CNP  Palliative Care Team  on 10/30/2024 at 2:19 PM

## 2024-10-30 NOTE — Care Coordination (Addendum)
 10/30/24  4:30 PM  [facility], [facility], [facility], [address], and [facility] have all declined no LTC beds available. [facility], [facility],and [facility] referrals remains pending. [facility] continues to accept and can accommodate a LTC patient with hospice and a T#.  CM has called [name] [phone], no response. CM has called Marval [phone] and provided updates. [name] to discuss with the rest of the family and provide CM additional SNF preferences. CM following.         _______________________________________________________________            Care Management Progress Note        Reason for Admission:   Hypoxemia [R09.02]  Severe sepsis (HCC) [A41.9, R65.20]  Community acquired pneumonia, unspecified laterality [J18.9]         Patient Admission Status: Inpatient  RUR: 16%  Hospitalization in the last 30 days (Readmission):  No        Transition of care plan:  Patient was discussed in IDR and is medically ready for discharge. Pending LTC placement at SNF with hospice. [facility] is on comfort care.     Dispo: SNF for LTC with hospice. Patient has a pending medicaid application T# [id number].  If SNF :  Date FOC offered: 10/31/23  CM received the following SNF/LTC preferences from pt's daughter Marval in no particular order: [facility], [facility], [facility], [facility], [facility], [facility], [facility], and [facility]. CM has sent referrals in [place].   Accepting facility:   Date authorization started with reference number: Patient does not need insurance authorization.   Date authorization received and expires:     Due to the number of family members involved in care Marval states daughter [name] [phone] can be contacted first. If [name] does not answer please contact daughter Marval [phone] second.     Discharge plan communicated with patient and/or discharge caregiver: Yes      Date 1st IMM letter given: 10/15/24    Outpatient  follow-up.    Transport at discharge: stretcher.         ___________________________________________   [name], RN Case Manager  10/30/2024   2:19 PM

## 2024-10-30 NOTE — Progress Notes (Signed)
 [place]  [address] [place], [place] [address]  [phone]      Hospitalist  Progress Note      NAME:       Chelsea Schultz   DOB:        [birth year]  MRM:        [id number]    Date of service: 10/30/2024      Subjective: Patient seen and examined by me. Patient admitted with multiple medical issues, now on comfort care measures only. Eating well as per her daughter but again, had a restless night. Family still working on getting a SNF.       Objective:    Vital Signs:    BP (!) 122/52   Pulse 83   Temp 98.2 F (36.8 C) (Axillary)   Resp 16   Ht 1.499 m (4' 11.02)   Wt 44.9 kg (98 lb 15.8 oz)   SpO2 93%   BMI 19.98 kg/m      No intake or output data in the 24 hours ending 10/30/24 1036       Current inpatient medications reviewed:  Current Facility-Administered Medications   Medication Dose Route Frequency    methylPREDNISolone  sodium succ (SOLU-MEDROL ) 40 mg in sterile water  1 mL injection  40 mg IntraVENous Once    diphenhydrAMINE  (BENADRYL ) injection 25 mg  25 mg IntraVENous Nightly PRN    diphenhydrAMINE -zinc  acetate (BENADRYL ) cream   Topical TID PRN    therapeutic (THERA-DERM) hand/body lotion   Topical PRN    acetaminophen  (TYLENOL ) suppository 650 mg  650 mg Rectal Q6H PRN    hydrocortisone  (ANUSOL -HC) 2.5 % rectal cream   Rectal TID    carboxymethylcellulose PF (THERATEARS/REFRESH) 1 % ophthalmic gel 1 drop  1 drop Both Eyes TID    lidocaine  (XYLOCAINE ) 5 % ointment   Topical PRN    nystatin  (MYCOSTATIN ) 100000 UNIT/ML suspension 500,000 Units  5 mL Oral 4x Daily    miconazole  (MICOTIN) 2 % vaginal cream 1 g  1 applicator Vaginal Nightly    glycopyrrolate  (ROBINUL ) IntraVENous 0.2 mg  0.2 mg IntraVENous Q4H PRN    ondansetron  (ZOFRAN ) injection 4 mg  4 mg IntraVENous Q6H PRN    LORazepam  (ATIVAN ) 2 MG/ML concentrated solution 0.5 mg  0.5 mg SubLINGual Q2H PRN    HYDROmorphone  HCl PF (DILAUDID )  injection 0.25 mg  0.25 mg IntraVENous Q15 Min PRN    diazePAM  (VALIUM ) injection 2.5 mg  2.5 mg IntraVENous Q30 Min PRN    oxyCODONE  (ROXICODONE ) 5 MG/5ML solution 2.5 mg  2.5 mg Oral Q2H PRN    benzocaine  (ORAJEL) 20 % mucosal gel   Mouth/Throat TID    hydrALAZINE  (APRESOLINE ) injection 10 mg  10 mg IntraVENous Q6H PRN    guaiFENesin  (ROBITUSSIN) 100 MG/5ML liquid 200 mg  200 mg Oral Q4H PRN    ipratropium 0.5 mg-albuterol  2.5 mg (DUONEB ) nebulizer solution 1 Dose  1 Dose Inhalation Q4H PRN    prochlorperazine  (COMPAZINE ) injection 10 mg  10 mg IntraVENous Q6H PRN    sodium chloride  flush 0.9 % injection 5-40 mL  5-40 mL IntraVENous PRN    0.9 % sodium chloride  infusion   IntraVENous PRN    albuterol  (PROVENTIL ) (2.5 MG/3ML) 0.083% nebulizer solution 2.5 mg  2.5 mg Nebulization Q4H PRN     Physical Examination:    General:   Weak and ill looking patient, resting comfortably.   Eyes:   pink conjunctivae, PERRLA with no discharge.  ENT:   no ottorrhea or rhinorrhea with dry mucous membranes  Pulm:  clear breath sounds without crackles or wheezes  Card:  no JVD or murmurs, has regular and normal S1, S2 without thrills, bruits  Abd:  Soft, non-tender, non-distended, normoactive bowel sounds  Musc:  No cyanosis, clubbing but generalized muscular atrophy   Psych:  Has little insight to her illness     Diagnostic testing:    Laboratory data reviewed and independently interpreted:    No results for input(s): WBC, HGB, HCT, RBC, MCV, MCH, PLT in the last 72 hours.  No results found for: LACTA  No results for input(s): NA, K, CL, CO2, GLUCOSE, BUN, CREATININE, CALCIUM , MG, PHOS, BILITOT, ALKPHOS, AST, ALT, INR in the last 72 hours.    Invalid input(s): PROT, ALB  No components found for: GLUCOSEPOC  Lab Results   Component Value Date/Time    CHOL 142 09/23/2018 02:19 PM    TRIG 73 09/23/2018 02:19 PM    HDL 56 09/23/2018 02:19 PM     Imaging data reviewed:    No results  found.    All other available tests and notes from other care providers have been reviewed by me on: 10/30/2024    Assessment and Plan:    Acute hypoxemic respiratory failure (HCC)   Aspiration pneumonia (HCC)   Pulmonary fibrosis (HCC)   UTI (urinary tract infection)   Type 2 diabetes mellitus with diabetic neuropathy (HCC)    HTN (hypertension), benign    History of cerebrovascular accident    Delirium    Dementia               - she has been reviewed by palliative care and hospice. Continue comfort care measures pending placement to SNF with hospice.                - continue IV benadryl  dose PRN at night time for itching. Add a dose of IV Solumedrol this afternoon. OK for IV Diazepam  PRN sleep.                 - continue comfort feeding as tolerated               - CM working with family on discharge planning.      Care Plan discussed with: Family, Nursing, CM                 Expected Disposition:  SNF with hospice    PCP:      Gonzella Lorane MATSU, MD     I have personally examined and treated the patient at bedside during this period. To assist coordination of care and communication with nursing and staff, this note may be preliminary early in the day, but finalized by end of the day.         ___________________________________________________    Attending Physician:   Lamar Doom, MD

## 2024-10-30 NOTE — Plan of Care (Signed)
 Problem: Skin/Tissue Integrity  Goal: Skin integrity remains intact  Description: 1.  Monitor for areas of redness and/or skin breakdown  2.  Assess vascular access sites hourly  3.  Every 4-6 hours minimum:  Change oxygen saturation probe site  4.  Every 4-6 hours:  If on nasal continuous positive airway pressure, respiratory therapy assess nares and determine need for appliance change or resting period  10/30/2024 2229 by Chelsea Lauraine Norris, RN  Outcome: Not Progressing  10/30/2024 2228 by Chelsea Lauraine Norris, RN  Outcome: Progressing  Flowsheets (Taken 10/30/2024 2007)  Skin Integrity Remains Intact: Monitor for areas of redness and/or skin breakdown  10/30/2024 0928 by Chelsea Ewings, RN  Outcome: Progressing  10/30/2024 0928 by Chelsea Ewings, RN  Outcome: Not Progressing     Problem: Safety - Adult  Goal: Free from fall injury  10/30/2024 2229 by Chelsea Lauraine Norris, RN  Outcome: Not Progressing  10/30/2024 2228 by Chelsea Lauraine Norris, RN  Outcome: Progressing  10/30/2024 0928 by Chelsea Ewings, RN  Outcome: Progressing  10/30/2024 0928 by Chelsea Ewings, RN  Outcome: Not Progressing     Problem: Chronic Conditions and Co-morbidities  Goal: Patient's chronic conditions and co-morbidity symptoms are monitored and maintained or improved  10/30/2024 2229 by Chelsea Lauraine Norris, RN  Outcome: Not Progressing  10/30/2024 2228 by Chelsea Lauraine Norris, RN  Outcome: Progressing  10/30/2024 0928 by Chelsea Ewings, RN  Outcome: Progressing  10/30/2024 0928 by Chelsea Ewings, RN  Outcome: Not Progressing     Problem: Discharge Planning  Goal: Discharge to home or other facility with appropriate resources  10/30/2024 2229 by Chelsea Lauraine Norris, RN  Outcome: Not Progressing  10/30/2024 2228 by Chelsea Lauraine Norris, RN  Outcome: Progressing  10/30/2024 0928 by Chelsea Ewings, RN  Outcome: Progressing  10/30/2024 0928 by Chelsea Ewings, RN  Outcome: Not Progressing     Problem: Respiratory -  Adult  Goal: Achieves optimal ventilation and oxygenation  Recent Flowsheet Documentation  Taken 10/30/2024 2007 by Chelsea Lauraine Norris, RN  Achieves optimal ventilation and oxygenation:   Assess for changes in respiratory status   Position to facilitate oxygenation and minimize respiratory effort  10/30/2024 0928 by Chelsea Ewings, RN  Outcome: Progressing  10/30/2024 0928 by Chelsea Ewings, RN  Outcome: Not Progressing     Problem: Confusion  Goal: Confusion, delirium, dementia, or psychosis is improved or at baseline  Description: INTERVENTIONS:  1. Assess for possible contributors to thought disturbance, including medications, impaired vision or hearing, underlying metabolic abnormalities, dehydration, psychiatric diagnoses, and notify attending LIP  2. Institute high risk fall precautions, as indicated  3. Provide frequent short contacts to provide reality reorientation, refocusing and direction  4. Decrease environmental stimuli, including noise as appropriate  5. Monitor and intervene to maintain adequate nutrition, hydration, elimination, sleep and activity  6. If unable to ensure safety without constant attention obtain sitter and review sitter guidelines with assigned personnel  7. Initiate Psychosocial CNS and Spiritual Care consult, as indicated  Recent Flowsheet Documentation  Taken 10/30/2024 2007 by Chelsea Lauraine Norris, RN  Effect of thought disturbance (confusion, delirium, dementia, or psychosis) are managed with adequate functional status: Assess for contributors to thought disturbance, including medications, impaired vision or hearing, underlying metabolic abnormalities, dehydration, psychiatric diagnoses, notify LIP  10/30/2024 0928 by Chelsea Ewings, RN  Outcome: Progressing  10/30/2024 0928 by Chelsea Ewings, RN  Outcome: Not Progressing     Problem: Pain  Goal: Verbalizes/displays adequate comfort level or baseline comfort level  Recent Flowsheet Documentation  Taken 10/30/2024 2007 by  Chelsea Lauraine Norris, RN  Verbalizes/displays adequate comfort level or baseline comfort level:   Encourage patient to monitor pain and request assistance   Assess pain using appropriate pain scale  10/30/2024 0928 by Chelsea Ewings, RN  Outcome: Progressing  10/30/2024 0928 by Chelsea Ewings, RN  Outcome: Not Progressing     Problem: Neurosensory - Adult  Goal: Achieves stable or improved neurological status  Recent Flowsheet Documentation  Taken 10/30/2024 2007 by Chelsea Lauraine Norris, RN  Achieves stable or improved neurological status: Assess for and report changes in neurological status  10/30/2024 0928 by Chelsea Ewings, RN  Outcome: Progressing  10/30/2024 0928 by Chelsea Ewings, RN  Outcome: Not Progressing  Goal: Achieves maximal functionality and self care  Recent Flowsheet Documentation  Taken 10/30/2024 2007 by Chelsea Lauraine Norris, RN  Achieves maximal functionality and self care: Monitor swallowing and airway patency with patient fatigue and changes in neurological status  10/30/2024 0928 by Chelsea Ewings, RN  Outcome: Progressing  10/30/2024 0928 by Chelsea Ewings, RN  Outcome: Not Progressing     Problem: Cardiovascular - Adult  Goal: Maintains optimal cardiac output and hemodynamic stability  Recent Flowsheet Documentation  Taken 10/30/2024 2007 by Chelsea Lauraine Norris, RN  Maintains optimal cardiac output and hemodynamic stability: Monitor blood pressure and heart rate  10/30/2024 0928 by Chelsea Ewings, RN  Outcome: Progressing  10/30/2024 0928 by Chelsea Ewings, RN  Outcome: Not Progressing  Goal: Absence of cardiac dysrhythmias or at baseline  Recent Flowsheet Documentation  Taken 10/30/2024 2007 by Chelsea Lauraine Norris, RN  Absence of cardiac dysrhythmias or at baseline: Monitor cardiac rate and rhythm  10/30/2024 0928 by Chelsea Ewings, RN  Outcome: Progressing  10/30/2024 0928 by Chelsea Ewings, RN  Outcome: Not Progressing     Problem: Skin/Tissue Integrity -  Adult  Goal: Skin integrity remains intact  Description: 1.  Monitor for areas of redness and/or skin breakdown  2.  Assess vascular access sites hourly  3.  Every 4-6 hours minimum:  Change oxygen saturation probe site  4.  Every 4-6 hours:  If on nasal continuous positive airway pressure, respiratory therapy assess nares and determine need for appliance change or resting period  10/30/2024 2229 by Chelsea Lauraine Norris, RN  Outcome: Not Progressing  10/30/2024 2228 by Chelsea Lauraine Norris, RN  Outcome: Progressing  Flowsheets (Taken 10/30/2024 2007)  Skin Integrity Remains Intact: Monitor for areas of redness and/or skin breakdown  10/30/2024 0928 by Chelsea Ewings, RN  Outcome: Progressing  10/30/2024 0928 by Chelsea Ewings, RN  Outcome: Not Progressing  Goal: Incisions, wounds, or drain sites healing without S/S of infection  10/30/2024 0928 by Chelsea Ewings, RN  Outcome: Progressing  10/30/2024 0928 by Chelsea Ewings, RN  Outcome: Not Progressing  Goal: Oral mucous membranes remain intact  10/30/2024 0928 by Chelsea Ewings, RN  Outcome: Progressing  10/30/2024 0928 by Chelsea Ewings, RN  Outcome: Not Progressing     Problem: Musculoskeletal - Adult  Goal: Return mobility to safest level of function  10/30/2024 0928 by Chelsea Ewings, RN  Outcome: Progressing  10/30/2024 0928 by Chelsea Ewings, RN  Outcome: Not Progressing  Goal: Maintain proper alignment of affected body part  10/30/2024 0928 by Chelsea Ewings, RN  Outcome: Progressing  10/30/2024 0928 by Chelsea Ewings, RN  Outcome: Not Progressing  Goal: Return ADL status to a safe level of function  10/30/2024 0928 by Chelsea Ewings, RN  Outcome: Progressing  10/30/2024 [id number]  by Chelsea Ewings, RN  Outcome: Not Progressing     Problem: Gastrointestinal - Adult  Goal: Minimal or absence of nausea and vomiting  10/30/2024 0928 by Chelsea Ewings, RN  Outcome: Progressing  10/30/2024 0928 by Chelsea Ewings, RN  Outcome: Not Progressing  Goal:  Maintains or returns to baseline bowel function  10/30/2024 0928 by Chelsea Ewings, RN  Outcome: Progressing  10/30/2024 0928 by Chelsea Ewings, RN  Outcome: Not Progressing  Goal: Maintains adequate nutritional intake  10/30/2024 0928 by Chelsea Ewings, RN  Outcome: Progressing  10/30/2024 0928 by Chelsea Ewings, RN  Outcome: Not Progressing     Problem: Genitourinary - Adult  Goal: Absence of urinary retention  10/30/2024 0928 by Chelsea Ewings, RN  Outcome: Progressing  10/30/2024 0928 by Chelsea Ewings, RN  Outcome: Not Progressing     Problem: Infection - Adult  Goal: Absence of infection at discharge  10/30/2024 2229 by Chelsea Lauraine Norris, RN  Outcome: Not Progressing  10/30/2024 0928 by Chelsea Ewings, RN  Outcome: Progressing  10/30/2024 0928 by Chelsea Ewings, RN  Outcome: Not Progressing  Goal: Absence of infection during hospitalization  10/30/2024 2229 by Chelsea Lauraine Norris, RN  Outcome: Not Progressing  10/30/2024 0928 by Chelsea Ewings, RN  Outcome: Progressing  10/30/2024 0928 by Chelsea Ewings, RN  Outcome: Not Progressing     Problem: Metabolic/Fluid and Electrolytes - Adult  Goal: Electrolytes maintained within normal limits  10/30/2024 0928 by Chelsea Ewings, RN  Outcome: Progressing  10/30/2024 0928 by Chelsea Ewings, RN  Outcome: Not Progressing  Goal: Hemodynamic stability and optimal renal function maintained  10/30/2024 0928 by Chelsea Ewings, RN  Outcome: Progressing  10/30/2024 0928 by Chelsea Ewings, RN  Outcome: Not Progressing  Goal: Glucose maintained within prescribed range  10/30/2024 0928 by Chelsea Ewings, RN  Outcome: Progressing  10/30/2024 0928 by Chelsea Ewings, RN  Outcome: Not Progressing     Problem: Hematologic - Adult  Goal: Maintains hematologic stability  10/30/2024 0928 by Chelsea Ewings, RN  Outcome: Progressing  10/30/2024 0928 by Chelsea Ewings, RN  Outcome: Not Progressing     Problem: Safety - Medical Restraint  Goal: Remains free of injury  from restraints (Restraint for Interference with Medical Device)  Description: INTERVENTIONS:  1. Determine that other, less restrictive measures have been tried or would not be effective before applying the restraint  2. Evaluate the patient's condition at the time of restraint application  3. Inform patient/family regarding the reason for restraint  4. Q2H: Monitor safety, psychosocial status, comfort, nutrition and hydration  10/30/2024 0928 by Chelsea Ewings, RN  Outcome: Progressing  10/30/2024 0928 by Chelsea Ewings, RN  Outcome: Not Progressing     Problem: ABCDS Injury Assessment  Goal: Absence of physical injury  10/30/2024 0928 by Chelsea Ewings, RN  Outcome: Progressing  10/30/2024 0928 by Chelsea Ewings, RN  Outcome: Not Progressing

## 2024-10-30 NOTE — Plan of Care (Incomplete)
 Problem: Skin/Tissue Integrity  Goal: Skin integrity remains intact  Description: 1.  Monitor for areas of redness and/or skin breakdown  2.  Assess vascular access sites hourly  3.  Every 4-6 hours minimum:  Change oxygen saturation probe site  4.  Every 4-6 hours:  If on nasal continuous positive airway pressure, respiratory therapy assess nares and determine need for appliance change or resting period  10/30/2024 2228 by Linnie Lauraine Norris, RN  Outcome: Progressing  Flowsheets (Taken 10/30/2024 2007)  Skin Integrity Remains Intact: Monitor for areas of redness and/or skin breakdown  10/30/2024 0928 by Othel Ewings, RN  Outcome: Progressing  10/30/2024 0928 by Othel Ewings, RN  Outcome: Not Progressing     Problem: Safety - Adult  Goal: Free from fall injury  10/30/2024 2228 by Linnie Lauraine Norris, RN  Outcome: Progressing  10/30/2024 0928 by Othel Ewings, RN  Outcome: Progressing  10/30/2024 0928 by Othel Ewings, RN  Outcome: Not Progressing     Problem: Chronic Conditions and Co-morbidities  Goal: Patient's chronic conditions and co-morbidity symptoms are monitored and maintained or improved  10/30/2024 2228 by Linnie Lauraine Norris, RN  Outcome: Progressing  10/30/2024 0928 by Othel Ewings, RN  Outcome: Progressing  10/30/2024 0928 by Othel Ewings, RN  Outcome: Not Progressing     Problem: Discharge Planning  Goal: Discharge to home or other facility with appropriate resources  10/30/2024 2228 by Linnie Lauraine Norris, RN  Outcome: Progressing  10/30/2024 0928 by Othel Ewings, RN  Outcome: Progressing  10/30/2024 0928 by Othel Ewings, RN  Outcome: Not Progressing     Problem: Respiratory - Adult  Goal: Achieves optimal ventilation and oxygenation  Recent Flowsheet Documentation  Taken 10/30/2024 2007 by Linnie Lauraine Norris, RN  Achieves optimal ventilation and oxygenation:   Assess for changes in respiratory status   Position to facilitate oxygenation and minimize respiratory  effort  10/30/2024 0928 by Othel Ewings, RN  Outcome: Progressing  10/30/2024 0928 by Othel Ewings, RN  Outcome: Not Progressing     Problem: Confusion  Goal: Confusion, delirium, dementia, or psychosis is improved or at baseline  Description: INTERVENTIONS:  1. Assess for possible contributors to thought disturbance, including medications, impaired vision or hearing, underlying metabolic abnormalities, dehydration, psychiatric diagnoses, and notify attending LIP  2. Institute high risk fall precautions, as indicated  3. Provide frequent short contacts to provide reality reorientation, refocusing and direction  4. Decrease environmental stimuli, including noise as appropriate  5. Monitor and intervene to maintain adequate nutrition, hydration, elimination, sleep and activity  6. If unable to ensure safety without constant attention obtain sitter and review sitter guidelines with assigned personnel  7. Initiate Psychosocial CNS and Spiritual Care consult, as indicated  Recent Flowsheet Documentation  Taken 10/30/2024 2007 by Linnie Lauraine Norris, RN  Effect of thought disturbance (confusion, delirium, dementia, or psychosis) are managed with adequate functional status: Assess for contributors to thought disturbance, including medications, impaired vision or hearing, underlying metabolic abnormalities, dehydration, psychiatric diagnoses, notify LIP  10/30/2024 0928 by Othel Ewings, RN  Outcome: Progressing  10/30/2024 0928 by Othel Ewings, RN  Outcome: Not Progressing     Problem: Pain  Goal: Verbalizes/displays adequate comfort level or baseline comfort level  Recent Flowsheet Documentation  Taken 10/30/2024 2007 by Linnie Lauraine Norris, RN  Verbalizes/displays adequate comfort level or baseline comfort level:   Encourage patient to monitor pain and request assistance   Assess pain using appropriate pain scale  10/30/2024 0928 by Othel Ewings, RN  Outcome:  Progressing  10/30/2024 0928 by Othel Ewings, RN  Outcome: Not Progressing     Problem: Neurosensory - Adult  Goal: Achieves stable or improved neurological status  Recent Flowsheet Documentation  Taken 10/30/2024 2007 by Linnie Lauraine Norris, RN  Achieves stable or improved neurological status: Assess for and report changes in neurological status  10/30/2024 0928 by Othel Ewings, RN  Outcome: Progressing  10/30/2024 0928 by Othel Ewings, RN  Outcome: Not Progressing  Goal: Achieves maximal functionality and self care  Recent Flowsheet Documentation  Taken 10/30/2024 2007 by Linnie Lauraine Norris, RN  Achieves maximal functionality and self care: Monitor swallowing and airway patency with patient fatigue and changes in neurological status  10/30/2024 0928 by Othel Ewings, RN  Outcome: Progressing  10/30/2024 0928 by Othel Ewings, RN  Outcome: Not Progressing     Problem: Cardiovascular - Adult  Goal: Maintains optimal cardiac output and hemodynamic stability  Recent Flowsheet Documentation  Taken 10/30/2024 2007 by Linnie Lauraine Norris, RN  Maintains optimal cardiac output and hemodynamic stability: Monitor blood pressure and heart rate  10/30/2024 0928 by Othel Ewings, RN  Outcome: Progressing  10/30/2024 0928 by Othel Ewings, RN  Outcome: Not Progressing  Goal: Absence of cardiac dysrhythmias or at baseline  Recent Flowsheet Documentation  Taken 10/30/2024 2007 by Linnie Lauraine Norris, RN  Absence of cardiac dysrhythmias or at baseline: Monitor cardiac rate and rhythm  10/30/2024 0928 by Othel Ewings, RN  Outcome: Progressing  10/30/2024 0928 by Othel Ewings, RN  Outcome: Not Progressing     Problem: Skin/Tissue Integrity - Adult  Goal: Skin integrity remains intact  Description: 1.  Monitor for areas of redness and/or skin breakdown  2.  Assess vascular access sites hourly  3.  Every 4-6 hours minimum:  Change oxygen saturation probe site  4.  Every 4-6 hours:  If on nasal continuous positive airway pressure,  respiratory therapy assess nares and determine need for appliance change or resting period  10/30/2024 2228 by Linnie Lauraine Norris, RN  Outcome: Progressing  Flowsheets (Taken 10/30/2024 2007)  Skin Integrity Remains Intact: Monitor for areas of redness and/or skin breakdown  10/30/2024 0928 by Othel Ewings, RN  Outcome: Progressing  10/30/2024 0928 by Othel Ewings, RN  Outcome: Not Progressing  Goal: Incisions, wounds, or drain sites healing without S/S of infection  10/30/2024 0928 by Othel Ewings, RN  Outcome: Progressing  10/30/2024 0928 by Othel Ewings, RN  Outcome: Not Progressing  Goal: Oral mucous membranes remain intact  10/30/2024 0928 by Othel Ewings, RN  Outcome: Progressing  10/30/2024 0928 by Othel Ewings, RN  Outcome: Not Progressing     Problem: Musculoskeletal - Adult  Goal: Return mobility to safest level of function  10/30/2024 0928 by Othel Ewings, RN  Outcome: Progressing  10/30/2024 0928 by Othel Ewings, RN  Outcome: Not Progressing  Goal: Maintain proper alignment of affected body part  10/30/2024 0928 by Othel Ewings, RN  Outcome: Progressing  10/30/2024 0928 by Othel Ewings, RN  Outcome: Not Progressing  Goal: Return ADL status to a safe level of function  10/30/2024 0928 by Othel Ewings, RN  Outcome: Progressing  10/30/2024 0928 by Othel Ewings, RN  Outcome: Not Progressing     Problem: Gastrointestinal - Adult  Goal: Minimal or absence of nausea and vomiting  10/30/2024 0928 by Othel Ewings, RN  Outcome: Progressing  10/30/2024 0928 by Othel Ewings, RN  Outcome: Not Progressing  Goal: Maintains or returns to baseline bowel function  10/30/2024 0928 by Othel,  Catarina, RN  Outcome: Progressing  10/30/2024 0928 by Othel Catarina, RN  Outcome: Not Progressing  Goal: Maintains adequate nutritional intake  10/30/2024 0928 by Othel Catarina, RN  Outcome: Progressing  10/30/2024 0928 by Othel Catarina, RN  Outcome: Not Progressing     Problem:  Genitourinary - Adult  Goal: Absence of urinary retention  10/30/2024 0928 by Othel Catarina, RN  Outcome: Progressing  10/30/2024 0928 by Othel Catarina, RN  Outcome: Not Progressing     Problem: Infection - Adult  Goal: Absence of infection at discharge  10/30/2024 0928 by Othel Catarina, RN  Outcome: Progressing  10/30/2024 0928 by Othel Catarina, RN  Outcome: Not Progressing  Goal: Absence of infection during hospitalization  10/30/2024 0928 by Othel Catarina, RN  Outcome: Progressing  10/30/2024 0928 by Othel Catarina, RN  Outcome: Not Progressing     Problem: Metabolic/Fluid and Electrolytes - Adult  Goal: Electrolytes maintained within normal limits  10/30/2024 0928 by Othel Catarina, RN  Outcome: Progressing  10/30/2024 0928 by Othel Catarina, RN  Outcome: Not Progressing  Goal: Hemodynamic stability and optimal renal function maintained  10/30/2024 0928 by Othel Catarina, RN  Outcome: Progressing  10/30/2024 0928 by Othel Catarina, RN  Outcome: Not Progressing  Goal: Glucose maintained within prescribed range  10/30/2024 0928 by Othel Catarina, RN  Outcome: Progressing  10/30/2024 0928 by Othel Catarina, RN  Outcome: Not Progressing     Problem: Hematologic - Adult  Goal: Maintains hematologic stability  10/30/2024 0928 by Othel Catarina, RN  Outcome: Progressing  10/30/2024 0928 by Othel Catarina, RN  Outcome: Not Progressing     Problem: Safety - Medical Restraint  Goal: Remains free of injury from restraints (Restraint for Interference with Medical Device)  Description: INTERVENTIONS:  1. Determine that other, less restrictive measures have been tried or would not be effective before applying the restraint  2. Evaluate the patient's condition at the time of restraint application  3. Inform patient/family regarding the reason for restraint  4. Q2H: Monitor safety, psychosocial status, comfort, nutrition and hydration  10/30/2024 0928 by Othel Catarina, RN  Outcome: Progressing  10/30/2024  0928 by Othel Catarina, RN  Outcome: Not Progressing     Problem: ABCDS Injury Assessment  Goal: Absence of physical injury  10/30/2024 0928 by Othel Catarina, RN  Outcome: Progressing  10/30/2024 0928 by Othel Catarina, RN  Outcome: Not Progressing

## 2024-10-31 MED ORDER — DIPHENHYDRAMINE HCL 50 MG/ML IJ SOLN
50 | Freq: Three times a day (TID) | INTRAMUSCULAR | Status: DC | PRN
Start: 2024-10-31 — End: 2024-11-03
  Administered 2024-10-31 – 2024-11-02 (×4): 25 mg via INTRAVENOUS

## 2024-10-31 MED ORDER — FAMOTIDINE 20 MG PO TABS
20 | Freq: Every day | ORAL | Status: DC
Start: 2024-10-31 — End: 2024-11-03
  Administered 2024-10-31 – 2024-11-01 (×2): 20 mg via ORAL

## 2024-10-31 MED FILL — REFRESH CELLUVISC 1 % OP GEL: 1 % | OPHTHALMIC | Qty: 0.13 | Fill #0

## 2024-10-31 MED FILL — DIPHENHYDRAMINE HCL 50 MG/ML IJ SOLN: 50 mg/mL | INTRAMUSCULAR | Qty: 1 | Fill #0

## 2024-10-31 MED FILL — REFRESH CELLUVISC 1 % OP GEL: 1 % | OPHTHALMIC | Qty: 1 | Fill #0

## 2024-10-31 MED FILL — NYSTATIN 100000 UNIT/ML MT SUSP: 100000 [IU]/mL | OROMUCOSAL | Qty: 5 | Fill #0

## 2024-10-31 MED FILL — FAMOTIDINE 20 MG PO TABS: 20 mg | ORAL | Qty: 1 | Fill #0

## 2024-10-31 NOTE — Care Coordination (Addendum)
 Case Management Note          10/31/24  3:35 PM  [facility] and [facility] have declined. [facility] and [facility] are reviewing. [name] has accepted. CM attempted to call pt's daughter and 1st contact [name] [phone], no answer. CM called pt's daughter and 2nd contact Chelsea Schultz [phone] to provide updates. At this time [name] and [name] are the only facilities that have accepted for LTC with hospice and a pending T#. UAI has been completed and approved. Patient remains medically ready for discharge.  [name] to discuss with additional family member and update CM. CM following.         _________________________________________________________________            CM received a voicemail from pt's daughter Chelsea Schultz [phone] providing the following additional SNF for LTC preferences: [facility], [facility], [address], [facility], and [facility]. CM has sent referral in [place]. Responses remain pending. CM following.               ___________________________________________   [name], RN Case Manager  10/31/2024   8:43 AM

## 2024-10-31 NOTE — Progress Notes (Signed)
 [facility]  [address] [place], [place] [address]  [phone]      Hospitalist  Progress Note      NAME:       Chelsea Schultz   DOB:        01/07/1926  MRM:        [id number]    Date of service: 10/31/2024      Subjective: Patient seen and examined by me. Patient admitted with multiple medical issues, now on comfort care measures only. Eating better although daughter states the food is too thick. She slept better last night.        Objective:    Vital Signs:    BP 126/65   Pulse 85   Temp 98.6 F (37 C) (Oral)   Resp 18   Ht 1.499 m (4' 11.02)   Wt 44.9 kg (98 lb 15.8 oz)   SpO2 92%   BMI 19.98 kg/m      No intake or output data in the 24 hours ending 10/31/24 1133       Current inpatient medications reviewed:  Current Facility-Administered Medications   Medication Dose Route Frequency    HYDROmorphone  HCl PF (DILAUDID ) injection 0.5 mg  0.5 mg IntraVENous Q15 Min PRN    diphenhydrAMINE  (BENADRYL ) injection 25 mg  25 mg IntraVENous Nightly PRN    diphenhydrAMINE -zinc  acetate (BENADRYL ) cream   Topical TID PRN    therapeutic (THERA-DERM) hand/body lotion   Topical PRN    acetaminophen  (TYLENOL ) suppository 650 mg  650 mg Rectal Q6H PRN    hydrocortisone  (ANUSOL -HC) 2.5 % rectal cream   Rectal TID    carboxymethylcellulose PF (THERATEARS/REFRESH) 1 % ophthalmic gel 1 drop  1 drop Both Eyes TID    lidocaine  (XYLOCAINE ) 5 % ointment   Topical PRN    nystatin  (MYCOSTATIN ) 100000 UNIT/ML suspension 500,000 Units  5 mL Oral 4x Daily    miconazole  (MICOTIN) 2 % vaginal cream 1 g  1 applicator Vaginal Nightly    glycopyrrolate  (ROBINUL ) IntraVENous 0.2 mg  0.2 mg IntraVENous Q4H PRN    ondansetron  (ZOFRAN ) injection 4 mg  4 mg IntraVENous Q6H PRN    LORazepam  (ATIVAN ) 2 MG/ML concentrated solution 0.5 mg  0.5 mg SubLINGual Q2H PRN    diazePAM  (VALIUM ) injection 2.5 mg  2.5 mg IntraVENous Q30 Min PRN    oxyCODONE   (ROXICODONE ) 5 MG/5ML solution 2.5 mg  2.5 mg Oral Q2H PRN    benzocaine  (ORAJEL) 20 % mucosal gel   Mouth/Throat TID    hydrALAZINE  (APRESOLINE ) injection 10 mg  10 mg IntraVENous Q6H PRN    guaiFENesin  (ROBITUSSIN) 100 MG/5ML liquid 200 mg  200 mg Oral Q4H PRN    ipratropium 0.5 mg-albuterol  2.5 mg (DUONEB ) nebulizer solution 1 Dose  1 Dose Inhalation Q4H PRN    prochlorperazine  (COMPAZINE ) injection 10 mg  10 mg IntraVENous Q6H PRN    sodium chloride  flush 0.9 % injection 5-40 mL  5-40 mL IntraVENous PRN    0.9 % sodium chloride  infusion   IntraVENous PRN    albuterol  (PROVENTIL ) (2.5 MG/3ML) 0.083% nebulizer solution 2.5 mg  2.5 mg Nebulization Q4H PRN     Physical Examination:    General:   Weak and ill looking patient, resting comfortably.   Eyes:   pink conjunctivae, PERRLA with no discharge.  Pulm:  clear breath sounds without crackles or wheezes  Card:  has regular and normal S1, S2 without thrills, bruits  Abd:  Soft, non-tender, non-distended, normoactive bowel sounds  Musc:  No cyanosis, clubbing but generalized muscular atrophy   Psych:  Has little insight to her illness     Diagnostic testing:    Laboratory data reviewed and independently interpreted:    No results for input(s): WBC, HGB, HCT, RBC, MCV, MCH, PLT in the last 72 hours.  No results found for: LACTA  No results for input(s): NA, K, CL, CO2, GLUCOSE, BUN, CREATININE, CALCIUM , MG, PHOS, BILITOT, ALKPHOS, AST, ALT, INR in the last 72 hours.    Invalid input(s): PROT, ALB  No components found for: GLUCOSEPOC  Lab Results   Component Value Date/Time    CHOL 142 09/23/2018 02:19 PM    TRIG 73 09/23/2018 02:19 PM    HDL 56 09/23/2018 02:19 PM     Imaging data reviewed:    No results found.    All other available tests and notes from other care providers have been reviewed by me on: 10/31/2024    Assessment and Plan:    Acute hypoxemic respiratory failure (HCC)   Aspiration pneumonia (HCC)    Pulmonary fibrosis (HCC)   UTI (urinary tract infection)   Type 2 diabetes mellitus with diabetic neuropathy (HCC)    HTN (hypertension), benign    History of cerebrovascular accident    Delirium    Dementia               - she has been reviewed by palliative care and hospice and is on comfort care measures pending placement to SNF with hospice.                - continue IV benadryl  dose PRN at night time for itching. Give a dose IV Solumedrol PRN if itching persists. IV Benzodiazepines PRN                  - continue diet as tolerated. Changed to full liquid with nutritional supplements. Dietician consult.                - CM working with family on discharge planning.      Care Plan discussed with: Family, Nursing, CM                 Expected Disposition:  SNF with hospice    PCP:      Gonzella Lorane MATSU, MD     I have personally examined and treated the patient at bedside during this period. To assist coordination of care and communication with nursing and staff, this note may be preliminary early in the day, but finalized by end of the day.         ___________________________________________________    Attending Physician:   Lamar Doom, MD

## 2024-10-31 NOTE — Plan of Care (Signed)
 Problem: Skin/Tissue Integrity  Goal: Skin integrity remains intact  Description: 1.  Monitor for areas of redness and/or skin breakdown  2.  Assess vascular access sites hourly  3.  Every 4-6 hours minimum:  Change oxygen saturation probe site  4.  Every 4-6 hours:  If on nasal continuous positive airway pressure, respiratory therapy assess nares and determine need for appliance change or resting period  10/31/2024 2259 by Joaquin Arlyne SQUIBB, RN  Outcome: Progressing  10/31/2024 1011 by Othel Ewings, RN  Outcome: Not Progressing  10/31/2024 1011 by Othel Ewings, RN  Outcome: Progressing     Problem: Safety - Adult  Goal: Free from fall injury  10/31/2024 2259 by Joaquin Arlyne SQUIBB, RN  Outcome: Progressing  10/31/2024 1011 by Othel Ewings, RN  Outcome: Not Progressing  10/31/2024 1011 by Othel Ewings, RN  Outcome: Progressing     Problem: Chronic Conditions and Co-morbidities  Goal: Patient's chronic conditions and co-morbidity symptoms are monitored and maintained or improved  10/31/2024 2259 by Joaquin Arlyne SQUIBB, RN  Outcome: Progressing  10/31/2024 1011 by Othel Ewings, RN  Outcome: Not Progressing  10/31/2024 1011 by Othel Ewings, RN  Outcome: Progressing     Problem: Discharge Planning  Goal: Discharge to home or other facility with appropriate resources  10/31/2024 2259 by Joaquin Arlyne SQUIBB, RN  Outcome: Progressing  10/31/2024 1011 by Othel Ewings, RN  Outcome: Not Progressing  10/31/2024 1011 by Othel Ewings, RN  Outcome: Progressing     Problem: Respiratory - Adult  Goal: Achieves optimal ventilation and oxygenation  10/31/2024 2259 by Joaquin Arlyne SQUIBB, RN  Outcome: Progressing  10/31/2024 1011 by Othel Ewings, RN  Outcome: Not Progressing  10/31/2024 1011 by Othel Ewings, RN  Outcome: Progressing     Problem: Confusion  Goal: Confusion, delirium, dementia, or psychosis is improved or at baseline  Description: INTERVENTIONS:  1. Assess for possible contributors  to thought disturbance, including medications, impaired vision or hearing, underlying metabolic abnormalities, dehydration, psychiatric diagnoses, and notify attending LIP  2. Institute high risk fall precautions, as indicated  3. Provide frequent short contacts to provide reality reorientation, refocusing and direction  4. Decrease environmental stimuli, including noise as appropriate  5. Monitor and intervene to maintain adequate nutrition, hydration, elimination, sleep and activity  6. If unable to ensure safety without constant attention obtain sitter and review sitter guidelines with assigned personnel  7. Initiate Psychosocial CNS and Spiritual Care consult, as indicated  10/31/2024 2259 by Joaquin Arlyne SQUIBB, RN  Outcome: Progressing  10/31/2024 1011 by Othel Ewings, RN  Outcome: Not Progressing  10/31/2024 1011 by Othel Ewings, RN  Outcome: Progressing     Problem: Pain  Goal: Verbalizes/displays adequate comfort level or baseline comfort level  10/31/2024 2259 by Joaquin Arlyne SQUIBB, RN  Outcome: Progressing  10/31/2024 1011 by Othel Ewings, RN  Outcome: Not Progressing  10/31/2024 1011 by Othel Ewings, RN  Outcome: Progressing     Problem: Neurosensory - Adult  Goal: Achieves stable or improved neurological status  10/31/2024 2259 by Joaquin Arlyne SQUIBB, RN  Outcome: Progressing  10/31/2024 1011 by Othel Ewings, RN  Outcome: Not Progressing  10/31/2024 1011 by Othel Ewings, RN  Outcome: Progressing  Goal: Achieves maximal functionality and self care  10/31/2024 2259 by Joaquin Arlyne SQUIBB, RN  Outcome: Progressing  10/31/2024 1011 by Othel Ewings, RN  Outcome: Not Progressing  10/31/2024 1011 by Othel Ewings, RN  Outcome: Progressing     Problem: Cardiovascular - Adult  Goal: Maintains  optimal cardiac output and hemodynamic stability  10/31/2024 2259 by Joaquin Arlyne SQUIBB, RN  Outcome: Progressing  10/31/2024 1011 by Othel Ewings, RN  Outcome: Not Progressing  10/31/2024  1011 by Othel Ewings, RN  Outcome: Progressing  Goal: Absence of cardiac dysrhythmias or at baseline  10/31/2024 2259 by Joaquin Arlyne SQUIBB, RN  Outcome: Progressing  10/31/2024 1011 by Othel Ewings, RN  Outcome: Not Progressing  10/31/2024 1011 by Othel Ewings, RN  Outcome: Progressing     Problem: Skin/Tissue Integrity - Adult  Goal: Skin integrity remains intact  Description: 1.  Monitor for areas of redness and/or skin breakdown  2.  Assess vascular access sites hourly  3.  Every 4-6 hours minimum:  Change oxygen saturation probe site  4.  Every 4-6 hours:  If on nasal continuous positive airway pressure, respiratory therapy assess nares and determine need for appliance change or resting period  10/31/2024 2259 by Joaquin Arlyne SQUIBB, RN  Outcome: Progressing  10/31/2024 1011 by Othel Ewings, RN  Outcome: Not Progressing  10/31/2024 1011 by Othel Ewings, RN  Outcome: Progressing  Goal: Incisions, wounds, or drain sites healing without S/S of infection  10/31/2024 2259 by Joaquin Arlyne SQUIBB, RN  Outcome: Progressing  10/31/2024 1011 by Othel Ewings, RN  Outcome: Not Progressing  10/31/2024 1011 by Othel Ewings, RN  Outcome: Progressing  Goal: Oral mucous membranes remain intact  10/31/2024 2259 by Joaquin Arlyne SQUIBB, RN  Outcome: Progressing  10/31/2024 1011 by Othel Ewings, RN  Outcome: Not Progressing  10/31/2024 1011 by Othel Ewings, RN  Outcome: Progressing     Problem: Musculoskeletal - Adult  Goal: Return mobility to safest level of function  10/31/2024 2259 by Joaquin Arlyne SQUIBB, RN  Outcome: Progressing  10/31/2024 1011 by Othel Ewings, RN  Outcome: Not Progressing  10/31/2024 1011 by Othel Ewings, RN  Outcome: Progressing  Goal: Maintain proper alignment of affected body part  10/31/2024 2259 by Joaquin Arlyne SQUIBB, RN  Outcome: Progressing  10/31/2024 1011 by Othel Ewings, RN  Outcome: Not Progressing  10/31/2024 1011 by Othel Ewings, RN  Outcome:  Progressing  Goal: Return ADL status to a safe level of function  10/31/2024 2259 by Joaquin Arlyne SQUIBB, RN  Outcome: Progressing  10/31/2024 1011 by Othel Ewings, RN  Outcome: Not Progressing  10/31/2024 1011 by Othel Ewings, RN  Outcome: Progressing     Problem: Gastrointestinal - Adult  Goal: Minimal or absence of nausea and vomiting  10/31/2024 2259 by Joaquin Arlyne SQUIBB, RN  Outcome: Progressing  10/31/2024 1011 by Othel Ewings, RN  Outcome: Not Progressing  10/31/2024 1011 by Othel Ewings, RN  Outcome: Progressing  Goal: Maintains or returns to baseline bowel function  10/31/2024 2259 by Joaquin Arlyne SQUIBB, RN  Outcome: Progressing  10/31/2024 1011 by Othel Ewings, RN  Outcome: Not Progressing  10/31/2024 1011 by Othel Ewings, RN  Outcome: Progressing  Goal: Maintains adequate nutritional intake  10/31/2024 2259 by Joaquin Arlyne SQUIBB, RN  Outcome: Progressing  10/31/2024 1011 by Othel Ewings, RN  Outcome: Not Progressing  10/31/2024 1011 by Othel Ewings, RN  Outcome: Progressing     Problem: Genitourinary - Adult  Goal: Absence of urinary retention  10/31/2024 2259 by Joaquin Arlyne SQUIBB, RN  Outcome: Progressing  10/31/2024 1011 by Othel Ewings, RN  Outcome: Not Progressing  10/31/2024 1011 by Othel Ewings, RN  Outcome: Progressing     Problem: Infection - Adult  Goal: Absence of infection at discharge  10/31/2024 2259 by Joaquin Arlyne SQUIBB,  RN  Outcome: Progressing  10/31/2024 1011 by Othel Ewings, RN  Outcome: Not Progressing  10/31/2024 1011 by Othel Ewings, RN  Outcome: Progressing  Goal: Absence of infection during hospitalization  10/31/2024 2259 by Joaquin Arlyne SQUIBB, RN  Outcome: Progressing  10/31/2024 1011 by Othel Ewings, RN  Outcome: Not Progressing  10/31/2024 1011 by Othel Ewings, RN  Outcome: Progressing     Problem: Metabolic/Fluid and Electrolytes - Adult  Goal: Electrolytes maintained within normal limits  10/31/2024 2259 by Joaquin Arlyne SQUIBB, RN  Outcome: Progressing  10/31/2024 1011 by Othel Ewings, RN  Outcome: Not Progressing  10/31/2024 1011 by Othel Ewings, RN  Outcome: Progressing  Goal: Hemodynamic stability and optimal renal function maintained  10/31/2024 2259 by Joaquin Arlyne SQUIBB, RN  Outcome: Progressing  10/31/2024 1011 by Othel Ewings, RN  Outcome: Not Progressing  10/31/2024 1011 by Othel Ewings, RN  Outcome: Progressing  Goal: Glucose maintained within prescribed range  10/31/2024 2259 by Joaquin Arlyne SQUIBB, RN  Outcome: Progressing  10/31/2024 1011 by Othel Ewings, RN  Outcome: Not Progressing  10/31/2024 1011 by Othel Ewings, RN  Outcome: Progressing     Problem: Hematologic - Adult  Goal: Maintains hematologic stability  10/31/2024 2259 by Joaquin Arlyne SQUIBB, RN  Outcome: Progressing  10/31/2024 1011 by Othel Ewings, RN  Outcome: Not Progressing  10/31/2024 1011 by Othel Ewings, RN  Outcome: Progressing     Problem: Safety - Medical Restraint  Goal: Remains free of injury from restraints (Restraint for Interference with Medical Device)  Description: INTERVENTIONS:  1. Determine that other, less restrictive measures have been tried or would not be effective before applying the restraint  2. Evaluate the patient's condition at the time of restraint application  3. Inform patient/family regarding the reason for restraint  4. Q2H: Monitor safety, psychosocial status, comfort, nutrition and hydration  10/31/2024 2259 by Joaquin Arlyne SQUIBB, RN  Outcome: Progressing  10/31/2024 1011 by Othel Ewings, RN  Outcome: Not Progressing  10/31/2024 1011 by Othel Ewings, RN  Outcome: Progressing     Problem: ABCDS Injury Assessment  Goal: Absence of physical injury  10/31/2024 2259 by Joaquin Arlyne SQUIBB, RN  Outcome: Progressing  10/31/2024 1011 by Othel Ewings, RN  Outcome: Not Progressing  10/31/2024 1011 by Othel Ewings, RN  Outcome: Progressing     Problem: Skin/Tissue Integrity  Goal:  Skin integrity remains intact  Description: 1.  Monitor for areas of redness and/or skin breakdown  2.  Assess vascular access sites hourly  3.  Every 4-6 hours minimum:  Change oxygen saturation probe site  4.  Every 4-6 hours:  If on nasal continuous positive airway pressure, respiratory therapy assess nares and determine need for appliance change or resting period  10/31/2024 2259 by Joaquin Arlyne SQUIBB, RN  Outcome: Progressing  10/31/2024 1011 by Othel Ewings, RN  Outcome: Not Progressing  10/31/2024 1011 by Othel Ewings, RN  Outcome: Progressing     Problem: Safety - Adult  Goal: Free from fall injury  10/31/2024 2259 by Joaquin Arlyne SQUIBB, RN  Outcome: Progressing  10/31/2024 1011 by Othel Ewings, RN  Outcome: Not Progressing  10/31/2024 1011 by Othel Ewings, RN  Outcome: Progressing     Problem: Chronic Conditions and Co-morbidities  Goal: Patient's chronic conditions and co-morbidity symptoms are monitored and maintained or improved  10/31/2024 2259 by Joaquin Arlyne SQUIBB, RN  Outcome: Progressing  10/31/2024 1011 by Othel Ewings, RN  Outcome: Not Progressing  10/31/2024 1011 by Othel Ewings, RN  Outcome: Progressing     Problem: Discharge Planning  Goal: Discharge to home or other facility with appropriate resources  10/31/2024 2259 by Joaquin Arlyne SQUIBB, RN  Outcome: Progressing  10/31/2024 1011 by Othel Ewings, RN  Outcome: Not Progressing  10/31/2024 1011 by Othel Ewings, RN  Outcome: Progressing     Problem: Respiratory - Adult  Goal: Achieves optimal ventilation and oxygenation  10/31/2024 2259 by Joaquin Arlyne SQUIBB, RN  Outcome: Progressing  10/31/2024 1011 by Othel Ewings, RN  Outcome: Not Progressing  10/31/2024 1011 by Othel Ewings, RN  Outcome: Progressing     Problem: Confusion  Goal: Confusion, delirium, dementia, or psychosis is improved or at baseline  Description: INTERVENTIONS:  1. Assess for possible contributors to thought disturbance, including  medications, impaired vision or hearing, underlying metabolic abnormalities, dehydration, psychiatric diagnoses, and notify attending LIP  2. Institute high risk fall precautions, as indicated  3. Provide frequent short contacts to provide reality reorientation, refocusing and direction  4. Decrease environmental stimuli, including noise as appropriate  5. Monitor and intervene to maintain adequate nutrition, hydration, elimination, sleep and activity  6. If unable to ensure safety without constant attention obtain sitter and review sitter guidelines with assigned personnel  7. Initiate Psychosocial CNS and Spiritual Care consult, as indicated  10/31/2024 2259 by Joaquin Arlyne SQUIBB, RN  Outcome: Progressing  10/31/2024 1011 by Othel Ewings, RN  Outcome: Not Progressing  10/31/2024 1011 by Othel Ewings, RN  Outcome: Progressing     Problem: Pain  Goal: Verbalizes/displays adequate comfort level or baseline comfort level  10/31/2024 2259 by Joaquin Arlyne SQUIBB, RN  Outcome: Progressing  10/31/2024 1011 by Othel Ewings, RN  Outcome: Not Progressing  10/31/2024 1011 by Othel Ewings, RN  Outcome: Progressing     Problem: Neurosensory - Adult  Goal: Achieves stable or improved neurological status  10/31/2024 2259 by Joaquin Arlyne SQUIBB, RN  Outcome: Progressing  10/31/2024 1011 by Othel Ewings, RN  Outcome: Not Progressing  10/31/2024 1011 by Othel Ewings, RN  Outcome: Progressing  Goal: Achieves maximal functionality and self care  10/31/2024 2259 by Joaquin Arlyne SQUIBB, RN  Outcome: Progressing  10/31/2024 1011 by Othel Ewings, RN  Outcome: Not Progressing  10/31/2024 1011 by Othel Ewings, RN  Outcome: Progressing     Problem: Cardiovascular - Adult  Goal: Maintains optimal cardiac output and hemodynamic stability  10/31/2024 2259 by Joaquin Arlyne SQUIBB, RN  Outcome: Progressing  10/31/2024 1011 by Othel Ewings, RN  Outcome: Not Progressing  10/31/2024 1011 by Othel Ewings,  RN  Outcome: Progressing  Goal: Absence of cardiac dysrhythmias or at baseline  10/31/2024 2259 by Joaquin Arlyne SQUIBB, RN  Outcome: Progressing  10/31/2024 1011 by Othel Ewings, RN  Outcome: Not Progressing  10/31/2024 1011 by Othel Ewings, RN  Outcome: Progressing     Problem: Skin/Tissue Integrity - Adult  Goal: Skin integrity remains intact  Description: 1.  Monitor for areas of redness and/or skin breakdown  2.  Assess vascular access sites hourly  3.  Every 4-6 hours minimum:  Change oxygen saturation probe site  4.  Every 4-6 hours:  If on nasal continuous positive airway pressure, respiratory therapy assess nares and determine need for appliance change or resting period  10/31/2024 2259 by Joaquin Arlyne SQUIBB, RN  Outcome: Progressing  10/31/2024 1011 by Othel Ewings, RN  Outcome: Not Progressing  10/31/2024 1011 by Othel Ewings, RN  Outcome: Progressing  Goal: Incisions, wounds, or drain sites healing without S/S of infection  10/31/2024 2259 by Joaquin Arlyne SQUIBB, RN  Outcome: Progressing  10/31/2024 1011 by Othel Ewings, RN  Outcome: Not Progressing  10/31/2024 1011 by Othel Ewings, RN  Outcome: Progressing  Goal: Oral mucous membranes remain intact  10/31/2024 2259 by Joaquin Arlyne SQUIBB, RN  Outcome: Progressing  10/31/2024 1011 by Othel Ewings, RN  Outcome: Not Progressing  10/31/2024 1011 by Othel Ewings, RN  Outcome: Progressing     Problem: Musculoskeletal - Adult  Goal: Return mobility to safest level of function  10/31/2024 2259 by Joaquin Arlyne SQUIBB, RN  Outcome: Progressing  10/31/2024 1011 by Othel Ewings, RN  Outcome: Not Progressing  10/31/2024 1011 by Othel Ewings, RN  Outcome: Progressing  Goal: Maintain proper alignment of affected body part  10/31/2024 2259 by Joaquin Arlyne SQUIBB, RN  Outcome: Progressing  10/31/2024 1011 by Othel Ewings, RN  Outcome: Not Progressing  10/31/2024 1011 by Othel Ewings, RN  Outcome: Progressing  Goal: Return ADL  status to a safe level of function  10/31/2024 2259 by Joaquin Arlyne SQUIBB, RN  Outcome: Progressing  10/31/2024 1011 by Othel Ewings, RN  Outcome: Not Progressing  10/31/2024 1011 by Othel Ewings, RN  Outcome: Progressing     Problem: Gastrointestinal - Adult  Goal: Minimal or absence of nausea and vomiting  10/31/2024 2259 by Joaquin Arlyne SQUIBB, RN  Outcome: Progressing  10/31/2024 1011 by Othel Ewings, RN  Outcome: Not Progressing  10/31/2024 1011 by Othel Ewings, RN  Outcome: Progressing  Goal: Maintains or returns to baseline bowel function  10/31/2024 2259 by Joaquin Arlyne SQUIBB, RN  Outcome: Progressing  10/31/2024 1011 by Othel Ewings, RN  Outcome: Not Progressing  10/31/2024 1011 by Othel Ewings, RN  Outcome: Progressing  Goal: Maintains adequate nutritional intake  10/31/2024 2259 by Joaquin Arlyne SQUIBB, RN  Outcome: Progressing  10/31/2024 1011 by Othel Ewings, RN  Outcome: Not Progressing  10/31/2024 1011 by Othel Ewings, RN  Outcome: Progressing     Problem: Genitourinary - Adult  Goal: Absence of urinary retention  10/31/2024 2259 by Joaquin Arlyne SQUIBB, RN  Outcome: Progressing  10/31/2024 1011 by Othel Ewings, RN  Outcome: Not Progressing  10/31/2024 1011 by Othel Ewings, RN  Outcome: Progressing     Problem: Infection - Adult  Goal: Absence of infection at discharge  10/31/2024 2259 by Joaquin Arlyne SQUIBB, RN  Outcome: Progressing  10/31/2024 1011 by Othel Ewings, RN  Outcome: Not Progressing  10/31/2024 1011 by Othel Ewings, RN  Outcome: Progressing  Goal: Absence of infection during hospitalization  10/31/2024 2259 by Joaquin Arlyne SQUIBB, RN  Outcome: Progressing  10/31/2024 1011 by Othel Ewings, RN  Outcome: Not Progressing  10/31/2024 1011 by Othel Ewings, RN  Outcome: Progressing     Problem: Metabolic/Fluid and Electrolytes - Adult  Goal: Electrolytes maintained within normal limits  10/31/2024 2259 by Joaquin Arlyne SQUIBB, RN  Outcome:  Progressing  10/31/2024 1011 by Othel Ewings, RN  Outcome: Not Progressing  10/31/2024 1011 by Othel Ewings, RN  Outcome: Progressing  Goal: Hemodynamic stability and optimal renal function maintained  10/31/2024 2259 by Joaquin Arlyne SQUIBB, RN  Outcome: Progressing  10/31/2024 1011 by Othel Ewings, RN  Outcome: Not Progressing  10/31/2024 1011 by Othel Ewings, RN  Outcome: Progressing  Goal: Glucose maintained within prescribed range  10/31/2024 2259 by Joaquin Arlyne SQUIBB, RN  Outcome: Progressing  10/31/2024 1011 by Othel Ewings, RN  Outcome: Not Progressing  10/31/2024 1011 by Othel Ewings, RN  Outcome: Progressing     Problem: Hematologic -  Adult  Goal: Maintains hematologic stability  10/31/2024 2259 by Joaquin Arlyne SQUIBB, RN  Outcome: Progressing  10/31/2024 1011 by Othel Ewings, RN  Outcome: Not Progressing  10/31/2024 1011 by Othel Ewings, RN  Outcome: Progressing     Problem: Safety - Medical Restraint  Goal: Remains free of injury from restraints (Restraint for Interference with Medical Device)  Description: INTERVENTIONS:  1. Determine that other, less restrictive measures have been tried or would not be effective before applying the restraint  2. Evaluate the patient's condition at the time of restraint application  3. Inform patient/family regarding the reason for restraint  4. Q2H: Monitor safety, psychosocial status, comfort, nutrition and hydration  10/31/2024 2259 by Joaquin Arlyne SQUIBB, RN  Outcome: Progressing  10/31/2024 1011 by Othel Ewings, RN  Outcome: Not Progressing  10/31/2024 1011 by Othel Ewings, RN  Outcome: Progressing     Problem: ABCDS Injury Assessment  Goal: Absence of physical injury  10/31/2024 2259 by Joaquin Arlyne SQUIBB, RN  Outcome: Progressing  10/31/2024 1011 by Othel Ewings, RN  Outcome: Not Progressing  10/31/2024 1011 by Othel Ewings, RN  Outcome: Progressing

## 2024-10-31 NOTE — Progress Notes (Signed)
 Spiritual Health Progress Note  [place]      Room # 420/01    Name: Chelsea Schultz           Age: 89 y.o.    Gender: female          MRN: [id number]  Religion: Non-denominational       Preferred Language: English      Date: 10/31/24  Visit Time: Begin Time: 1441 End Time : 1457  Complexity of Encounter: Low      Visit Summary: Chaplain visited pt [facility] while rounding. Chart reviewed and conferred with RN Catarina. Pt is resting in bed with two family members present. Chaplain provides supportive presence and checks for unmet needs. Family expresses peace and well-being. Family expresses gratitude for support. Family is deciding on pt's placement at this time and is hoping for a smooth transition when the time comes. Chaplains remains available for support as requested.     Referral/Consult From: Rounding  Encounter Overview/Reason: Follow-up  Encounter Code:     Crisis (if applicable):    Service Provided For: Patient and family together     Patient was available.    Faith, Belief, Meaning:   Patient unable to assess at this time  Family/Friends unable to assess at this time  Rituals (if applicable)      Importance and Influence:  Patient unable to assess at this time  Family/Friends Other: unable to assess at this time    Community:  Patient   indicated that they feel well-supported  Family/Friends   indicated that they feel well-supported    Assessment and Plan of Care:   Emotions Expressed by Patient:   Assessment: Unable to assess    Interventions by Chaplain:   Intervention: Sustaining Presence/Ministry of presence     Result/ Response by Patient:   Outcome: Did not respond    Patient Plan of Care:   Plan and Referrals  Plan/Referrals: Other (Comment) (Chaplain is available if needed)     Emotions Expressed by Spouse/Family/Friends:   calm  hopeful  content    Chaplain Interventions with Spouse/ Family/Friends include:   active listening  facilitated expression of thoughts and feelings  provided  ministry of presence    Spouse/Family/Friends Plan of Care:   Spiritual care available upon referral.      Electronically signed by Chaplain Resident Betty Bos, M.Div on 10/31/2024 at 2:57 PM  Spiritual Health  [facility], [facility]  Page a chaplain: [phone])

## 2024-10-31 NOTE — Progress Notes (Signed)
 Palliative Medicine  Patient Name: Chelsea Schultz  Date of Birth: 03-Sep-1926  MRN: [id number]  Age: 89 y.o.  Gender: female    Date of Initial Consult: 10/20/24  Date of Service: 10/31/2024  Time: 11:29 AM  Provider: Franchot Dickens, APRN - [facility] Day: 22  Admit Date: 10/10/2024  Referring Provider:  Dr. Rosalea       Reasons for Consultation:  Other: goals of care, code status    HISTORY OF PRESENT ILLNESS (HPI):   OASIS GOEHRING is a 89 y.o. female with a past medical history of interstitial lung disease, prior CVA (2013, 2016), DM, HTN, recurrent UTIs who was admitted on 10/10/2024 from home with productive cough, fatigue for ~1 week. Upon arrival to ED, noted to be febrile and hypoxic requiring supplemental oxygen. Initial chest imaging obtained in ED revealing diffuse interstitial prominence of ground glass opacities suggestive of pulmonary fibrosis vs vs pneumonitis vs pneumonia. Was started on empiric IV antibiotics and admitted for further workup, medical management. Course has been significant for UTI, hospital acquired delirium with waxing/waning mentation. Review indicates fluctuating agitation causing her to self remove supplemental oxygen causing hypoxia, which has prompted several RRTs. Also with increasing lethargy, overall decline, concerns of aspiration with progressive dysphagia. Plan for MBS today. Palliative consulted to assist with goals.       Psychosocial: Six children - 5 living children to include four daughters and one son. Lives with daughter [name], pt has resided with Cathy since 2011. Prior, lived with daughter [name] in [place]. [name] is primary caregiver, does work full time hours at the [employer]. Additionally family has put in place private caregiver M-F 9-5, and 3 evenings a week from 5-9.     Recent baseline prior to admission: obtained from daughters   -Cognitively, able to recognize family members, able to carry on discussion/conversation with daughters and caregivers. Frequently has  disorientation in the evenings where she can become tearful/emotional but often redirectable.   -Functionally, able to stand/pivot and participate in some of her care albeit essentially reliant on all ADLs including bathing, toileting, dressing. Requires meal preparation.     Pt seen today at bedside prior to family meeting with four daughters. Currently resting in bed, on 3L NC. No distress, is confused but redirectable without overt agitation.    -12/30: Seen mid afternoon, dtr Kim at bedside but no other family present at time of visit. [name] was present earlier, [name] coming in around ~5:00-5:30 PM today. Unfortunately with significant decline since this morning. She is much less responsive, does not open eyes to voice. Suspected aspiration event thus reverted back to NPO.   -12/31: Seen midmorning, dtr Luke and dtr [name] at bedside. No significant changes from yesterday as pt remains minimally responsive, family reports she had finally settled after generalized discomfort this morning so did not wake per request. Remains on 2L NC but no distress. No prn medication requirements.   -1/2: Seen midmorning, dtr Jori and Adrien present. Pt resting comfortably on 2L NC. No notable symptoms, appears comfortable. Family states she slept through well through the evening. Feels as if Tylenol  suppositories are helping generalized pain.   1/5: Seen midmorning, comfortable appearing resting in bed. Debra and I review interval events and medication requirements, new orders over the weekend. She shares she has been a bit more awake at times and able to tolerate portions of pureed meals as well as sips of water  and tolerated well thus far. Enjoyed more family visitors  over the weekend. Slept through the night last night comfortably. Has required a few doses of symptom medications albeit not many.   -1/7: Seen mid afternoon, pt more awake in comparison to prior visits. Remains confused, nods to simple yes/no questions. Has  been able to eat small portions of pureed textures for comfort feeding. Has redness on b/l arms and chest without any overt notable rashes or lesions, no drainage. Family not sure if new wipes or come into contact with other new topicals.   -1/8: Seen early afternoon, dtrs Jori and [name] present. Pt just cleaned up by nursing. Quite restless today, itching around her neck and arms, still with redness but redness improved from yesterday. Still no new lesions or drainage noticed. Turning becoming more painful to pt per nursing. Ate some bites of pureed texture dinner last night but otherwise PO intake much less. Had a restlessness night, asking for God to take her. Dtr states Dilaudid  dose was not very helpful last night in alleviating symptoms.    -1/9: Seen late morning, dtr Jori present. Pt resting comfortably, itching has significantly improved. Slept much better last night. Ate a few bites of breakfast, was able to have short conversations with Jori this morning. Family has not noticed any further overt symptoms of pain or anxiety.     PALLIATIVE DIAGNOSES:    Acute hypoxic respiratory failure; multifactorial  Delirium superimposed on baseline dementia  Dysphagia  High risk aspiration   Physical debility, generalized weakness   Goals of care  Advance care planning   Code status discussion     ASSESSMENT AND PLAN:   Chart reviewed prior to seeing pt; including notes, labs, imaging.   Interval events reviewed and discussed with dtrs Jori and [name] at bedside as well as bedside nursing team, Multimedia Programmer  Goals --   Continue to be clear for comfort focused care, managing symptoms as working on assessing symptoms, also monitoring her needs and assessing hospice placement.   Discussed with Jori this morning, are trying to balance best supportive care with comfort. Hoping to add ONS to aid in decreasing skin breakdown, as long as she tolerates, seen by RD today to assist with appropriate textures. Jori  tells me family is all on the same page of listening to her body, providing food/drink if she wishes and allowing her to not eat if she does not desire food. We talk about the anticipated and gradual decline in intake we may see when she transitions towards end of life.   Provide support in the gift that pt has had periodic moments of more alertness and minimal IV symptom requirements. Aware that next steps at this time is hospice outside of the hospital - plan for facility placement.   Continue comfort measures, managing symptoms, continuing to work on disposition.   Code status --   DNR/DNI  Will benefit from durable prior to discharge   ACP --   Pt adult children as LNOK - all work together in decision making   Symptoms --   Anxiety/restlessness/agitation:   Ativan  0.5 mg oral solution every 2 hours prn   Valium  2.5 mg IV every 30 minutes prn   Nonverbal signs of pain:   Will stop scheduled Tylenol  as pt/family prefer PR Tylenol , continue as needed   Oxycodone  2.5 mg oral solution every 2 hours prn   Dilaudid  0.5 mg IV every 15 minutes prn   Pruritus:   Unclear cause - discussed with Dr. Salem, reviewing medications. Noticed  roughly within the last 48 hours, do not see any new medications for her around this time. Improved today.   S/p one time dose IV solumedrol    Benadryl  25 mg IV every 8 hours prn   Will add benadryl  cream prn per family request as well   Would avoid CHG containing wipes   Monitor closely for other adverse signs/symptoms   Initial consult note routed to primary continuity provider and/or primary health care team members  Discussed with Dr. Salem, RN Delaney   Please call with any palliative questions or concerns.  Palliative Care Team is available via perfect serve or via phone.    Referrals to:   []  Outpatient Palliative Care  []  Home Based Palliative Care  []  Home Based Primary Care  [x]  Hospice       ADVANCE CARE PLANNING:   []  The Pall Med Interdisciplinary Team has updated the ACP  Navigator with Health Care Decision Maker and Patient Capacity      Primary Decision Maker: [place], Starr - Child [phone]    Primary Decision Maker: Manya Notice - Child [phone]  Confirm Advance Directive: None  Patient Would Like to Complete Advance Directive: Unable    Current Code Status: DNR     Goals of Care: Goals of Care and Interventions  Patient/Health Care Proxy Stated Goals: Recovery from acute illness  Medical Interventions: Full interventions  Life Goals  Patient and Family Personal Goals: continue current measures, hopeful for recovery    Please refer to Palliative Medicine ACP notes for further details.    PALLIATIVE ASSESSMENT:      Palliative Performance Scale (PPS):  PPS: 30    ECOG:        Modified ESAS:  Modified-[place] Symptom Assessment Scale (ESAS)  Pain Score: No pain  Dyspnea Score: No shortness of breath    Clinical Pain Assessment (nonverbal scale for severity on nonverbal patients):   Clinical Pain Assessment  Severity: 0  Location: UTA  Character: UTA  Duration: UTA  Factors: UTA  Frequency: UTA       NVPS:  Adult Nonverbal Pain Scale (NVPS)  Face: No particular expression or smile  Activity (Movement): Laid quietly, normal position  Guarding: Lying quietly, no positioning of hands over areas of bod  Physiology (Vital Signs): Stable vital signs  Respiratory: Baseline RR/SpO2 compliant with ventilator  NVPS Score : 0    RDOS:  RDOS  Heart rate per minute: less than 90  Respiratory Rate per Minute: less than 19  Restlessness:non-purposeful: None  Paradoxical breathing pattern:abdomen moves in on inspiration: None  Accessory muscle use: rise in clavicle during inspiration: None  Grunting at end-expiration: guttural sound: None  Nasal flaring: involuntary movement of nares: None  Total : 0      Vital Signs: Blood pressure 126/65, pulse 85, temperature 98.6 F (37 C), temperature source Oral, resp. rate 18, height 1.499 m (4' 11.02), weight 44.9 kg (98 lb 15.8  oz), SpO2 92%.    PHYSICAL ASSESSMENT:   General: []  Oriented x3  []  Well appearing  []  Intubated  [x] Ill appearing  [] Other: minimally responsive, comfortable appearing, neutral facial expression   Mental Status: []  Normal mental status exam  []  Drowsy  []  Confused  [] Other: minimally responsive   Cardiovascular: []  Regular rate/rhythm  []  Arrhythmia  [x]  Other:  Chest: [x]  Effort normal  [] Lungs clear  []  Respiratory distress  [] Tachypnea  []  Other: 2L NC, no respiratory distress or increased work of breathing  Abdomen: []  Soft/non-tender  []  Normal appearance  []  Distended  []  Ascites  []  Other:  Neurological: []  Normal speech  []  Normal sensation  [x] Deficits present: minimally responsive   Extremity: []  Normal skin color/temp  []  Clubbing/cyanosis  []  No edema  [x]  Other: debilitated appearing, temporal muscle wasting noted     Wt Readings from Last 15 Encounters:   10/17/24 44.9 kg (98 lb 15.8 oz)   06/26/23 40.8 kg (90 lb)   06/19/22 49.9 kg (110 lb)   02/24/22 48.8 kg (107 lb 9.6 oz)   12/29/21 43.5 kg (96 lb)        Current Diet: ADULT ORAL NUTRITION SUPPLEMENT; Breakfast, Lunch, Dinner; Educational Psychologist Protein Oral Supplement  ADULT DIET; Dysphagia - Pureed       PSYCHOSOCIAL/SPIRITUAL SCREENING:   Palliative IDT has assessed this patient for cultural preferences / practices and a referral made as appropriate to needs Gaffer, Patient Advocacy, Ethics, etc.)    Spiritual Affiliation: Non-Denominational    Any spiritual / religious concerns:  []  Yes /  [x]  No   If Yes to discuss with pastoral care during IDT     Does caregiver feel burdened by caring for their loved one:   []  Yes /  [x]  No /  []  No Caregiver Present/Available []  No Caregiver []  Pt Lives at Facility  If Yes to discuss with social work during IDT    Anticipatory grief assessment:   [x]  Normal  / []  Maladaptive     If Maladaptive to discuss with social work during IDT    ESAS Anxiety:      ESAS Depression:           LAB AND IMAGING FINDINGS:   Objective data reviewed:  labs, images, records, medication use, vitals, and chart     FINAL COMMENTS   Thank you for allowing Palliative Medicine to participate in the care of Leonie VEAR Pouch.    Only check if applicable and billing time based rather than MDM  []  The total encounter time on this service date was __35__ minutes which was spent performing a face-to-face encounter and personally completing the provider-level activities documented in the note. This includes time spent prior to the visit and after the visit in direct care of the patient. This time does not include time spent in any separately reportable services.    Electronically signed by   Franchot Dickens, APRN - CNP  Palliative Care Team  on 10/31/2024 at 11:29 AM

## 2024-10-31 NOTE — Consults (Signed)
 Nutrition Note    RD consulted to discuss diet texture preferences with pt/family who notes pureed foods seem to be too thick, however they do not wish for a FLD. Will continues purees and ask for sides of gravy or milk to be sent which eat meal for thinning. Will also allow oatmeal. Diet updated.     Electronically signed by Victory Ranger, MS, RD on 10/31/24 at 10:48 AM EST

## 2024-10-31 NOTE — Plan of Care (Signed)
 Problem: Skin/Tissue Integrity  Goal: Skin integrity remains intact  Description: 1.  Monitor for areas of redness and/or skin breakdown  2.  Assess vascular access sites hourly  3.  Every 4-6 hours minimum:  Change oxygen saturation probe site  4.  Every 4-6 hours:  If on nasal continuous positive airway pressure, respiratory therapy assess nares and determine need for appliance change or resting period  10/31/2024 1011 by Chelsea Ewings, RN  Outcome: Not Progressing  10/31/2024 1011 by Chelsea Ewings, RN  Outcome: Progressing  10/30/2024 2229 by Chelsea Lauraine Norris, RN  Outcome: Not Progressing  10/30/2024 2228 by Chelsea Lauraine Norris, RN  Outcome: Progressing  Flowsheets (Taken 10/30/2024 2007)  Skin Integrity Remains Intact: Monitor for areas of redness and/or skin breakdown     Problem: Skin/Tissue Integrity  Goal: Skin integrity remains intact  Description: 1.  Monitor for areas of redness and/or skin breakdown  2.  Assess vascular access sites hourly  3.  Every 4-6 hours minimum:  Change oxygen saturation probe site  4.  Every 4-6 hours:  If on nasal continuous positive airway pressure, respiratory therapy assess nares and determine need for appliance change or resting period  10/31/2024 1011 by Chelsea Ewings, RN  Outcome: Not Progressing  10/31/2024 1011 by Chelsea Ewings, RN  Outcome: Progressing  10/30/2024 2229 by Chelsea Lauraine Norris, RN  Outcome: Not Progressing  10/30/2024 2228 by Chelsea Lauraine Norris, RN  Outcome: Progressing  Flowsheets (Taken 10/30/2024 2007)  Skin Integrity Remains Intact: Monitor for areas of redness and/or skin breakdown     Problem: Safety - Adult  Goal: Free from fall injury  10/31/2024 1011 by Chelsea Ewings, RN  Outcome: Not Progressing  10/31/2024 1011 by Chelsea Ewings, RN  Outcome: Progressing  10/30/2024 2229 by Chelsea Lauraine Norris, RN  Outcome: Not Progressing  10/30/2024 2228 by Chelsea Lauraine Norris, RN  Outcome: Progressing     Problem: Chronic  Conditions and Co-morbidities  Goal: Patient's chronic conditions and co-morbidity symptoms are monitored and maintained or improved  10/31/2024 1011 by Chelsea Ewings, RN  Outcome: Not Progressing  10/31/2024 1011 by Chelsea Ewings, RN  Outcome: Progressing  10/30/2024 2229 by Chelsea Lauraine Norris, RN  Outcome: Not Progressing  10/30/2024 2228 by Chelsea Lauraine Norris, RN  Outcome: Progressing     Problem: Discharge Planning  Goal: Discharge to home or other facility with appropriate resources  10/31/2024 1011 by Chelsea Ewings, RN  Outcome: Not Progressing  10/31/2024 1011 by Chelsea Ewings, RN  Outcome: Progressing  10/30/2024 2229 by Chelsea Lauraine Norris, RN  Outcome: Not Progressing  10/30/2024 2228 by Chelsea Lauraine Norris, RN  Outcome: Progressing     Problem: Respiratory - Adult  Goal: Achieves optimal ventilation and oxygenation  10/31/2024 1011 by Chelsea Ewings, RN  Outcome: Not Progressing  10/31/2024 1011 by Chelsea Ewings, RN  Outcome: Progressing     Problem: Confusion  Goal: Confusion, delirium, dementia, or psychosis is improved or at baseline  Description: INTERVENTIONS:  1. Assess for possible contributors to thought disturbance, including medications, impaired vision or hearing, underlying metabolic abnormalities, dehydration, psychiatric diagnoses, and notify attending LIP  2. Institute high risk fall precautions, as indicated  3. Provide frequent short contacts to provide reality reorientation, refocusing and direction  4. Decrease environmental stimuli, including noise as appropriate  5. Monitor and intervene to maintain adequate nutrition, hydration, elimination, sleep and activity  6. If unable to ensure safety without constant attention obtain sitter and review sitter guidelines with assigned personnel  7. Initiate Psychosocial CNS and Spiritual Care consult, as indicated  10/31/2024 1011 by Chelsea Ewings, RN  Outcome: Not Progressing  10/31/2024 1011 by Chelsea Ewings,  RN  Outcome: Progressing     Problem: Pain  Goal: Verbalizes/displays adequate comfort level or baseline comfort level  10/31/2024 1011 by Chelsea Ewings, RN  Outcome: Not Progressing  10/31/2024 1011 by Chelsea Ewings, RN  Outcome: Progressing     Problem: Neurosensory - Adult  Goal: Achieves stable or improved neurological status  10/31/2024 1011 by Chelsea Ewings, RN  Outcome: Not Progressing  10/31/2024 1011 by Chelsea Ewings, RN  Outcome: Progressing  Goal: Achieves maximal functionality and self care  10/31/2024 1011 by Chelsea Ewings, RN  Outcome: Not Progressing  10/31/2024 1011 by Chelsea Ewings, RN  Outcome: Progressing     Problem: Cardiovascular - Adult  Goal: Maintains optimal cardiac output and hemodynamic stability  10/31/2024 1011 by Chelsea Ewings, RN  Outcome: Not Progressing  10/31/2024 1011 by Chelsea Ewings, RN  Outcome: Progressing  Goal: Absence of cardiac dysrhythmias or at baseline  10/31/2024 1011 by Chelsea Ewings, RN  Outcome: Not Progressing  10/31/2024 1011 by Chelsea Ewings, RN  Outcome: Progressing     Problem: Skin/Tissue Integrity - Adult  Goal: Skin integrity remains intact  Description: 1.  Monitor for areas of redness and/or skin breakdown  2.  Assess vascular access sites hourly  3.  Every 4-6 hours minimum:  Change oxygen saturation probe site  4.  Every 4-6 hours:  If on nasal continuous positive airway pressure, respiratory therapy assess nares and determine need for appliance change or resting period  10/31/2024 1011 by Chelsea Ewings, RN  Outcome: Not Progressing  10/31/2024 1011 by Chelsea Ewings, RN  Outcome: Progressing  10/30/2024 2229 by Chelsea Lauraine Norris, RN  Outcome: Not Progressing  10/30/2024 2228 by Chelsea Lauraine Norris, RN  Outcome: Progressing  Flowsheets (Taken 10/30/2024 2007)  Skin Integrity Remains Intact: Monitor for areas of redness and/or skin breakdown  Goal: Incisions, wounds, or drain sites healing without S/S of infection  10/31/2024  1011 by Chelsea Ewings, RN  Outcome: Not Progressing  10/31/2024 1011 by Chelsea Ewings, RN  Outcome: Progressing  Goal: Oral mucous membranes remain intact  10/31/2024 1011 by Chelsea Ewings, RN  Outcome: Not Progressing  10/31/2024 1011 by Chelsea Ewings, RN  Outcome: Progressing     Problem: Musculoskeletal - Adult  Goal: Return mobility to safest level of function  10/31/2024 1011 by Chelsea Ewings, RN  Outcome: Not Progressing  10/31/2024 1011 by Chelsea Ewings, RN  Outcome: Progressing  Goal: Maintain proper alignment of affected body part  10/31/2024 1011 by Chelsea Ewings, RN  Outcome: Not Progressing  10/31/2024 1011 by Chelsea Ewings, RN  Outcome: Progressing  Goal: Return ADL status to a safe level of function  10/31/2024 1011 by Chelsea Ewings, RN  Outcome: Not Progressing  10/31/2024 1011 by Chelsea Ewings, RN  Outcome: Progressing     Problem: Gastrointestinal - Adult  Goal: Minimal or absence of nausea and vomiting  10/31/2024 1011 by Chelsea Ewings, RN  Outcome: Not Progressing  10/31/2024 1011 by Chelsea Ewings, RN  Outcome: Progressing  Goal: Maintains or returns to baseline bowel function  10/31/2024 1011 by Chelsea Ewings, RN  Outcome: Not Progressing  10/31/2024 1011 by Chelsea Ewings, RN  Outcome: Progressing  Goal: Maintains adequate nutritional intake  10/31/2024 1011 by Chelsea Ewings, RN  Outcome: Not Progressing  10/31/2024 1011 by Chelsea Ewings, RN  Outcome: Progressing  Problem: Genitourinary - Adult  Goal: Absence of urinary retention  10/31/2024 1011 by Chelsea Ewings, RN  Outcome: Not Progressing  10/31/2024 1011 by Chelsea Ewings, RN  Outcome: Progressing     Problem: Infection - Adult  Goal: Absence of infection at discharge  10/31/2024 1011 by Chelsea Ewings, RN  Outcome: Not Progressing  10/31/2024 1011 by Chelsea Ewings, RN  Outcome: Progressing  10/30/2024 2229 by Chelsea Lauraine Norris, RN  Outcome: Not Progressing  Goal: Absence of infection during  hospitalization  10/31/2024 1011 by Chelsea Ewings, RN  Outcome: Not Progressing  10/31/2024 1011 by Chelsea Ewings, RN  Outcome: Progressing  10/30/2024 2229 by Chelsea Lauraine Norris, RN  Outcome: Not Progressing     Problem: Metabolic/Fluid and Electrolytes - Adult  Goal: Electrolytes maintained within normal limits  10/31/2024 1011 by Chelsea Ewings, RN  Outcome: Not Progressing  10/31/2024 1011 by Chelsea Ewings, RN  Outcome: Progressing  Goal: Hemodynamic stability and optimal renal function maintained  10/31/2024 1011 by Chelsea Ewings, RN  Outcome: Not Progressing  10/31/2024 1011 by Chelsea Ewings, RN  Outcome: Progressing  Goal: Glucose maintained within prescribed range  10/31/2024 1011 by Chelsea Ewings, RN  Outcome: Not Progressing  10/31/2024 1011 by Chelsea Ewings, RN  Outcome: Progressing     Problem: Hematologic - Adult  Goal: Maintains hematologic stability  10/31/2024 1011 by Chelsea Ewings, RN  Outcome: Not Progressing  10/31/2024 1011 by Chelsea Ewings, RN  Outcome: Progressing     Problem: Safety - Medical Restraint  Goal: Remains free of injury from restraints (Restraint for Interference with Medical Device)  Description: INTERVENTIONS:  1. Determine that other, less restrictive measures have been tried or would not be effective before applying the restraint  2. Evaluate the patient's condition at the time of restraint application  3. Inform patient/family regarding the reason for restraint  4. Q2H: Monitor safety, psychosocial status, comfort, nutrition and hydration  10/31/2024 1011 by Chelsea Ewings, RN  Outcome: Not Progressing  10/31/2024 1011 by Chelsea Ewings, RN  Outcome: Progressing     Problem: ABCDS Injury Assessment  Goal: Absence of physical injury  10/31/2024 1011 by Chelsea Ewings, RN  Outcome: Not Progressing  10/31/2024 1011 by Chelsea Ewings, RN  Outcome: Progressing

## 2024-10-31 NOTE — Care Coordination (Signed)
 10/31/24 11:23 AM  UAI completed and approved.  Leonor Brunswick, MSW

## 2024-11-01 MED FILL — NYSTATIN 100000 UNIT/ML MT SUSP: 100000 [IU]/mL | OROMUCOSAL | Qty: 5

## 2024-11-01 MED FILL — REFRESH CELLUVISC 1 % OP GEL: 1 % | OPHTHALMIC | Qty: 1

## 2024-11-01 MED FILL — NYSTATIN 100000 UNIT/ML MT SUSP: 100000 [IU]/mL | OROMUCOSAL | Qty: 5 | Fill #0

## 2024-11-01 MED FILL — BENADRYL ITCH STOPPING 1-0.1 % EX CREA: 1-0.1 % | CUTANEOUS | Qty: 1

## 2024-11-01 MED FILL — DIPHENHYDRAMINE HCL 50 MG/ML IJ SOLN: 50 mg/mL | INTRAMUSCULAR | Qty: 1

## 2024-11-01 MED FILL — LORAZEPAM 2 MG/ML PO CONC: 2 mg/mL | ORAL | Qty: 0.5

## 2024-11-01 MED FILL — REFRESH CELLUVISC 1 % OP GEL: 1 % | OPHTHALMIC | Qty: 0.13

## 2024-11-01 MED FILL — FAMOTIDINE 20 MG PO TABS: 20 mg | ORAL | Qty: 1

## 2024-11-01 NOTE — Plan of Care (Signed)
 Problem: Skin/Tissue Integrity  Goal: Skin integrity remains intact  Description: 1.  Monitor for areas of redness and/or skin breakdown  2.  Assess vascular access sites hourly  3.  Every 4-6 hours minimum:  Change oxygen saturation probe site  4.  Every 4-6 hours:  If on nasal continuous positive airway pressure, respiratory therapy assess nares and determine need for appliance change or resting period  11/01/2024 2306 by Kriste Browning, LPN  Outcome: Progressing  11/01/2024 1608 by Isaias Hedge, RN  Outcome: Progressing     Problem: Safety - Adult  Goal: Free from fall injury  11/01/2024 2306 by Kriste Browning, LPN  Outcome: Progressing  11/01/2024 1608 by Isaias Hedge, RN  Outcome: Progressing     Problem: Chronic Conditions and Co-morbidities  Goal: Patient's chronic conditions and co-morbidity symptoms are monitored and maintained or improved  11/01/2024 2306 by Kriste Browning, LPN  Outcome: Progressing  11/01/2024 1608 by Isaias Hedge, RN  Outcome: Progressing     Problem: Respiratory - Adult  Goal: Achieves optimal ventilation and oxygenation  11/01/2024 2306 by Kriste Browning, LPN  Outcome: Progressing  11/01/2024 1608 by Isaias Hedge, RN  Outcome: Progressing     Problem: Neurosensory - Adult  Goal: Achieves stable or improved neurological status  11/01/2024 2306 by Kriste Browning, LPN  Outcome: Progressing  11/01/2024 1608 by Isaias Hedge, RN  Outcome: Progressing  Goal: Achieves maximal functionality and self care  11/01/2024 2306 by Kriste Browning, LPN  Outcome: Progressing  11/01/2024 1608 by Isaias Hedge, RN  Outcome: Progressing     Problem: Cardiovascular - Adult  Goal: Maintains optimal cardiac output and hemodynamic stability  11/01/2024 2306 by Kriste Browning, LPN  Outcome: Progressing  11/01/2024 1608 by Isaias Hedge, RN  Outcome: Progressing  Goal: Absence of cardiac dysrhythmias or at baseline  11/01/2024 2306 by Kriste Browning, LPN  Outcome:  Progressing  11/01/2024 1608 by Isaias Hedge, RN  Outcome: Progressing     Problem: Skin/Tissue Integrity - Adult  Goal: Skin integrity remains intact  Description: 1.  Monitor for areas of redness and/or skin breakdown  2.  Assess vascular access sites hourly  3.  Every 4-6 hours minimum:  Change oxygen saturation probe site  4.  Every 4-6 hours:  If on nasal continuous positive airway pressure, respiratory therapy assess nares and determine need for appliance change or resting period  11/01/2024 2306 by Kriste Browning, LPN  Outcome: Progressing  11/01/2024 1608 by Isaias Hedge, RN  Outcome: Progressing  Goal: Incisions, wounds, or drain sites healing without S/S of infection  11/01/2024 2306 by Kriste Browning, LPN  Outcome: Progressing  11/01/2024 1608 by Isaias Hedge, RN  Outcome: Progressing  Goal: Oral mucous membranes remain intact  11/01/2024 2306 by Kriste Browning, LPN  Outcome: Progressing  11/01/2024 1608 by Isaias Hedge, RN  Outcome: Progressing     Problem: Musculoskeletal - Adult  Goal: Return mobility to safest level of function  11/01/2024 2306 by Kriste Browning, LPN  Outcome: Progressing  11/01/2024 1608 by Isaias Hedge, RN  Outcome: Progressing  Goal: Maintain proper alignment of affected body part  11/01/2024 2306 by Kriste Browning, LPN  Outcome: Progressing  11/01/2024 1608 by Isaias Hedge, RN  Outcome: Progressing  Goal: Return ADL status to a safe level of function  11/01/2024 2306 by Kriste Browning, LPN  Outcome: Progressing  11/01/2024 1608 by Isaias Hedge, RN  Outcome: Progressing     Problem: Gastrointestinal - Adult  Goal: Minimal or absence of nausea and vomiting  11/01/2024 2306 by Kriste Browning, LPN  Outcome: Progressing  11/01/2024 1608 by Isaias Hedge, RN  Outcome: Progressing  Goal: Maintains or returns to baseline bowel function  11/01/2024 2306 by Kriste Browning, LPN  Outcome: Progressing  11/01/2024 1608 by Isaias Hedge, RN  Outcome:  Progressing  Goal: Maintains adequate nutritional intake  11/01/2024 2306 by Kriste Browning, LPN  Outcome: Progressing  11/01/2024 1608 by Isaias Hedge, RN  Outcome: Progressing     Problem: Genitourinary - Adult  Goal: Absence of urinary retention  11/01/2024 2306 by Kriste Browning, LPN  Outcome: Progressing  11/01/2024 1608 by Isaias Hedge, RN  Outcome: Progressing     Problem: Infection - Adult  Goal: Absence of infection at discharge  11/01/2024 2306 by Kriste Browning, LPN  Outcome: Progressing  11/01/2024 1608 by Isaias Hedge, RN  Outcome: Progressing  Goal: Absence of infection during hospitalization  11/01/2024 2306 by Kriste Browning, LPN  Outcome: Progressing  11/01/2024 1608 by Isaias Hedge, RN  Outcome: Progressing     Problem: Metabolic/Fluid and Electrolytes - Adult  Goal: Electrolytes maintained within normal limits  11/01/2024 2306 by Kriste Browning, LPN  Outcome: Progressing  11/01/2024 1608 by Isaias Hedge, RN  Outcome: Progressing  Goal: Hemodynamic stability and optimal renal function maintained  11/01/2024 2306 by Kriste Browning, LPN  Outcome: Progressing  11/01/2024 1608 by Isaias Hedge, RN  Outcome: Progressing  Goal: Glucose maintained within prescribed range  11/01/2024 2306 by Kriste Browning, LPN  Outcome: Progressing  11/01/2024 1608 by Isaias Hedge, RN  Outcome: Progressing     Problem: Hematologic - Adult  Goal: Maintains hematologic stability  11/01/2024 2306 by Kriste Browning, LPN  Outcome: Progressing  11/01/2024 1608 by Isaias Hedge, RN  Outcome: Progressing     Problem: Confusion  Goal: Confusion, delirium, dementia, or psychosis is improved or at baseline  Description: INTERVENTIONS:  1. Assess for possible contributors to thought disturbance, including medications, impaired vision or hearing, underlying metabolic abnormalities, dehydration, psychiatric diagnoses, and notify attending LIP  2. Institute high risk fall precautions, as indicated  3.  Provide frequent short contacts to provide reality reorientation, refocusing and direction  4. Decrease environmental stimuli, including noise as appropriate  5. Monitor and intervene to maintain adequate nutrition, hydration, elimination, sleep and activity  6. If unable to ensure safety without constant attention obtain sitter and review sitter guidelines with assigned personnel  7. Initiate Psychosocial CNS and Spiritual Care consult, as indicated  11/01/2024 2306 by Kriste Browning, LPN  Outcome: Progressing  11/01/2024 1608 by Isaias Hedge, RN  Outcome: Progressing     Problem: Pain  Goal: Verbalizes/displays adequate comfort level or baseline comfort level  11/01/2024 2306 by Kriste Browning, LPN  Outcome: Progressing  11/01/2024 1608 by Isaias Hedge, RN  Outcome: Progressing     Problem: Safety - Medical Restraint  Goal: Remains free of injury from restraints (Restraint for Interference with Medical Device)  Description: INTERVENTIONS:  1. Determine that other, less restrictive measures have been tried or would not be effective before applying the restraint  2. Evaluate the patient's condition at the time of restraint application  3. Inform patient/family regarding the reason for restraint  4. Q2H: Monitor safety, psychosocial status, comfort, nutrition and hydration  11/01/2024 2306 by Kriste Browning, LPN  Outcome: Progressing  11/01/2024 1608 by Isaias Hedge, RN  Outcome: Progressing     Problem: ABCDS Injury Assessment  Goal: Absence of physical injury  11/01/2024 2306 by Kriste Browning, LPN  Outcome: Progressing  11/01/2024 1608  by Isaias Hedge, RN  Outcome: Progressing

## 2024-11-01 NOTE — Care Coordination (Signed)
 Care Management Progress Note    Reason for Admission:   Hypoxemia [R09.02]  Severe sepsis (HCC) [A41.9, R65.20]  Community acquired pneumonia, unspecified laterality [J18.9]         Patient Admission Status: Inpatient  RUR: 16%  Hospitalization in the last 30 days (Readmission):  No        Transition of care plan:  Medical management continues as dc plans are finalized.  Ready from a medical standpoint to transfer.   SNF for LTC with hospice.  Patient has a pending medicaid appln. U#[id number] and UAI has been completed.   If SNF or IPR:  Date FOC offered: 1/08  Accepting facility: family deciding between [facility] and [place] also programmer, systems final answer regarding acceptance from [facility] and [facility].  Family plans to tour some this weekend and call cm Monday.   This cm encouraged them to call Sunday as well once they have a decision  Date authorization started with reference number: NO auth needed  Date authorization received and expires: NA  Discharge plan communicated with patient and/or discharge caregiver: Yes    Date 2nd IMM letter given: 1/03  Outpatient follow-up.  Transport at discharge: TBD

## 2024-11-01 NOTE — Progress Notes (Signed)
 Hospitalist Progress Note      NAME:  Chelsea Schultz   DOB:  09-Jul-1926  MRM:  [id number]    Date/Time: 11/01/2024  1:01 PM           Assessment / Plan:     89 yo Hx of HTN, DM, interstitial lung dz, dementia, presented w/ AMS, resp failure, hypoxia, pulm fibrosis, enterococcus UTI     Present on Admission:   Severe sepsis (HCC)   Pulmonary fibrosis (HCC)   Acute hypoxemic respiratory failure (HCC)   Delirium   Type 2 diabetes mellitus with diabetic neuropathy (HCC)   HTN (hypertension), benign   History of cerebrovascular accident   Aspiration pneumonia (HCC)   UTI (urinary tract infection)    # Acute hypoxic respiratory failure  # Aspiration pneumonia  # Enterococcus UTI  # Acute metabolic encephalopathy  # Dementia  # Sacral pressure injury  # Oral candidiasis  # Hypercalcemia  # Diabetes mellitus  # Hypertension    After extensive goals of care discussion with family, patient transition to comfort care by palliative team.Appreciate palliative care assistance with this.  On exam, she is oriented only to self.  No new focal deficits.  Delirium precautions using a combination of nonpharmacologic and pharmacologic strategies.  Nonpharmacologic interventions : Minimize nighttime interruptions, try to maintain normal sleep-wake cycle, optimize nutrition, frequently reorient.      Pharmacologic : Diazepam /Ativan  as needed.  Bedside sitter if needed.  Maintain fall precautions.  Diet as tolerated.  Pain control as needed.  Continue IV Benadryl  as needed for itching.  Case management on board for discharge planning.  Anticipate discharge to SNF with hospice.    #BMI (Calculated): 19.98    I have personally reviewed the radiographs, laboratory data in Epic and decisions and statements above are based partially on this personal interpretation.                 Care Plan discussed with: Patient and Family    Discussed:  Code Status and Care Plan    Prophylaxis:  SCD's    Disposition: hospice at  facility           ___________________________________________________    Attending Physician: Debby JINNY Binder, MD        Subjective:     Chief Complaint: Denies any symptoms    ROS:    Patient was seen and examined at bedside.  Patient's daughter at bedside.  I updated her.  Case discussed with RN.     Objective:       Vitals:          Last 24hrs VS reviewed since prior progress note. Most recent are:    Vitals:    11/01/24 0953   BP:    Pulse:    Resp:    Temp:    SpO2: 92%     SpO2 Readings from Last 6 Encounters:   11/01/24 92%   08/19/24 96%   06/26/23 97%   09/05/22 96%   06/19/22 96%   05/11/22 94%          Intake/Output Summary (Last 24 hours) at 11/01/2024 1301  Last data filed at 10/31/2024 2050  Gross per 24 hour   Intake --   Output 350 ml   Net -350 ml            Exam:     Physical Exam:    Gen: Chronically ill-appearing  HEENT: Dry mucous membrane, white plaque seen on  buccal mucosa  Neck:  Supple, without masses, thyroid non-tender  Resp:  No accessory muscle use, clear breath sounds without wheezes rales or rhonchi  Card:  No murmurs, normal S1, S2 without thrills, bruits or peripheral edema  Abd:  Soft, non-tender, non-distended, normoactive bowel sounds are present  Musc:  No cyanosis or clubbing  Skin: Sacral pressure injury, minimal loss of skin, no signs of infection  Neuro: No focal deficits, unable to assess sensations, reflexes 2+  Psych: Oriented only to self    Medications Reviewed: (see below)    Lab Data Reviewed: (see below)    ______________________________________________________________________    Medications:     Current Facility-Administered Medications   Medication Dose Route Frequency    diphenhydrAMINE  (BENADRYL ) injection 25 mg  25 mg IntraVENous Q8H PRN    famotidine  (PEPCID ) tablet 20 mg  20 mg Oral Daily    HYDROmorphone  HCl PF (DILAUDID ) injection 0.5 mg  0.5 mg IntraVENous Q15 Min PRN    diphenhydrAMINE -zinc  acetate (BENADRYL ) cream   Topical TID PRN    therapeutic  (THERA-DERM) hand/body lotion   Topical PRN    acetaminophen  (TYLENOL ) suppository 650 mg  650 mg Rectal Q6H PRN    hydrocortisone  (ANUSOL -HC) 2.5 % rectal cream   Rectal TID    carboxymethylcellulose PF (THERATEARS/REFRESH) 1 % ophthalmic gel 1 drop  1 drop Both Eyes TID    lidocaine  (XYLOCAINE ) 5 % ointment   Topical PRN    nystatin  (MYCOSTATIN ) 100000 UNIT/ML suspension 500,000 Units  5 mL Oral 4x Daily    miconazole  (MICOTIN) 2 % vaginal cream 1 g  1 applicator Vaginal Nightly    glycopyrrolate  (ROBINUL ) IntraVENous 0.2 mg  0.2 mg IntraVENous Q4H PRN    ondansetron  (ZOFRAN ) injection 4 mg  4 mg IntraVENous Q6H PRN    LORazepam  (ATIVAN ) 2 MG/ML concentrated solution 0.5 mg  0.5 mg SubLINGual Q2H PRN    diazePAM  (VALIUM ) injection 2.5 mg  2.5 mg IntraVENous Q30 Min PRN    oxyCODONE  (ROXICODONE ) 5 MG/5ML solution 2.5 mg  2.5 mg Oral Q2H PRN    benzocaine  (ORAJEL) 20 % mucosal gel   Mouth/Throat TID    hydrALAZINE  (APRESOLINE ) injection 10 mg  10 mg IntraVENous Q6H PRN    guaiFENesin  (ROBITUSSIN) 100 MG/5ML liquid 200 mg  200 mg Oral Q4H PRN    ipratropium 0.5 mg-albuterol  2.5 mg (DUONEB ) nebulizer solution 1 Dose  1 Dose Inhalation Q4H PRN    prochlorperazine  (COMPAZINE ) injection 10 mg  10 mg IntraVENous Q6H PRN    sodium chloride  flush 0.9 % injection 5-40 mL  5-40 mL IntraVENous PRN    0.9 % sodium chloride  infusion   IntraVENous PRN    albuterol  (PROVENTIL ) (2.5 MG/3ML) 0.083% nebulizer solution 2.5 mg  2.5 mg Nebulization Q4H PRN            Lab Review:     No results for input(s): WBC, HGB, HCT, PLT in the last 72 hours.    No results for input(s): NA, K, CL, CO2, GLU, BUN, MG, PHOS, ALT, INR in the last 72 hours.    Invalid input(s): CREA, CA, ALB, TBIL, SGOT    No components found for: [facility]

## 2024-11-01 NOTE — Plan of Care (Signed)
 Problem: Skin/Tissue Integrity  Goal: Skin integrity remains intact  Description: 1.  Monitor for areas of redness and/or skin breakdown  2.  Assess vascular access sites hourly  3.  Every 4-6 hours minimum:  Change oxygen saturation probe site  4.  Every 4-6 hours:  If on nasal continuous positive airway pressure, respiratory therapy assess nares and determine need for appliance change or resting period  Outcome: Progressing     Problem: Safety - Adult  Goal: Free from fall injury  Outcome: Progressing     Problem: Chronic Conditions and Co-morbidities  Goal: Patient's chronic conditions and co-morbidity symptoms are monitored and maintained or improved  Outcome: Progressing     Problem: Discharge Planning  Goal: Discharge to home or other facility with appropriate resources  Outcome: Progressing     Problem: Respiratory - Adult  Goal: Achieves optimal ventilation and oxygenation  Outcome: Progressing     Problem: Confusion  Goal: Confusion, delirium, dementia, or psychosis is improved or at baseline  Description: INTERVENTIONS:  1. Assess for possible contributors to thought disturbance, including medications, impaired vision or hearing, underlying metabolic abnormalities, dehydration, psychiatric diagnoses, and notify attending LIP  2. Institute high risk fall precautions, as indicated  3. Provide frequent short contacts to provide reality reorientation, refocusing and direction  4. Decrease environmental stimuli, including noise as appropriate  5. Monitor and intervene to maintain adequate nutrition, hydration, elimination, sleep and activity  6. If unable to ensure safety without constant attention obtain sitter and review sitter guidelines with assigned personnel  7. Initiate Psychosocial CNS and Spiritual Care consult, as indicated  Outcome: Progressing     Problem: Pain  Goal: Verbalizes/displays adequate comfort level or baseline comfort level  Outcome: Progressing     Problem: Neurosensory - Adult  Goal:  Achieves stable or improved neurological status  Outcome: Progressing  Goal: Achieves maximal functionality and self care  Outcome: Progressing     Problem: Cardiovascular - Adult  Goal: Maintains optimal cardiac output and hemodynamic stability  Outcome: Progressing  Goal: Absence of cardiac dysrhythmias or at baseline  Outcome: Progressing     Problem: Skin/Tissue Integrity - Adult  Goal: Skin integrity remains intact  Description: 1.  Monitor for areas of redness and/or skin breakdown  2.  Assess vascular access sites hourly  3.  Every 4-6 hours minimum:  Change oxygen saturation probe site  4.  Every 4-6 hours:  If on nasal continuous positive airway pressure, respiratory therapy assess nares and determine need for appliance change or resting period  Outcome: Progressing  Goal: Incisions, wounds, or drain sites healing without S/S of infection  Outcome: Progressing  Goal: Oral mucous membranes remain intact  Outcome: Progressing     Problem: Musculoskeletal - Adult  Goal: Return mobility to safest level of function  Outcome: Progressing  Goal: Maintain proper alignment of affected body part  Outcome: Progressing  Goal: Return ADL status to a safe level of function  Outcome: Progressing     Problem: Gastrointestinal - Adult  Goal: Minimal or absence of nausea and vomiting  Outcome: Progressing  Goal: Maintains or returns to baseline bowel function  Outcome: Progressing  Goal: Maintains adequate nutritional intake  Outcome: Progressing     Problem: Genitourinary - Adult  Goal: Absence of urinary retention  Outcome: Progressing     Problem: Infection - Adult  Goal: Absence of infection at discharge  Outcome: Progressing  Goal: Absence of infection during hospitalization  Outcome: Progressing     Problem:  Metabolic/Fluid and Electrolytes - Adult  Goal: Electrolytes maintained within normal limits  Outcome: Progressing  Goal: Hemodynamic stability and optimal renal function maintained  Outcome: Progressing  Goal:  Glucose maintained within prescribed range  Outcome: Progressing     Problem: Hematologic - Adult  Goal: Maintains hematologic stability  Outcome: Progressing     Problem: Safety - Medical Restraint  Goal: Remains free of injury from restraints (Restraint for Interference with Medical Device)  Description: INTERVENTIONS:  1. Determine that other, less restrictive measures have been tried or would not be effective before applying the restraint  2. Evaluate the patient's condition at the time of restraint application  3. Inform patient/family regarding the reason for restraint  4. Q2H: Monitor safety, psychosocial status, comfort, nutrition and hydration  Outcome: Progressing     Problem: ABCDS Injury Assessment  Goal: Absence of physical injury  Outcome: Progressing

## 2024-11-02 MED ORDER — SODIUM CHLORIDE 0.9 % IV SOLN
0.9 | INTRAVENOUS | Status: DC
Start: 2024-11-02 — End: 2024-11-03
  Administered 2024-11-02 – 2024-11-03 (×2): via INTRAVENOUS

## 2024-11-02 MED ORDER — HYDROCORTISONE 1 % EX CREA
1 | Freq: Three times a day (TID) | CUTANEOUS | Status: DC
Start: 2024-11-02 — End: 2024-11-03
  Administered 2024-11-02 – 2024-11-03 (×3): via TOPICAL

## 2024-11-02 MED FILL — DIPHENHYDRAMINE HCL 50 MG/ML IJ SOLN: 50 mg/mL | INTRAMUSCULAR | Qty: 1

## 2024-11-02 MED FILL — FAMOTIDINE 20 MG PO TABS: 20 mg | ORAL | Qty: 1

## 2024-11-02 MED FILL — REFRESH CELLUVISC 1 % OP GEL: 1 % | OPHTHALMIC | Qty: 0.13

## 2024-11-02 MED FILL — SODIUM CHLORIDE 0.9 % IV SOLN: 0.9 % | INTRAVENOUS | Qty: 1000

## 2024-11-02 MED FILL — LORAZEPAM 2 MG/ML PO CONC: 2 mg/mL | ORAL | Qty: 0.5

## 2024-11-02 MED FILL — GLYCOPYRROLATE 0.2 MG/ML IJ SOLN: 0.2 mg/mL | INTRAMUSCULAR | Qty: 1

## 2024-11-02 MED FILL — REFRESH CELLUVISC 1 % OP GEL: 1 % | OPHTHALMIC | Qty: 1

## 2024-11-02 MED FILL — HYDROCORTISONE/ALOE MAX STR 1 % EX CREA: 1 % | CUTANEOUS | Qty: 28

## 2024-11-02 MED FILL — NYSTATIN 100000 UNIT/ML MT SUSP: 100000 [IU]/mL | OROMUCOSAL | Qty: 5

## 2024-11-02 MED FILL — HYDROMORPHONE HCL 1 MG/ML IJ SOLN: 1 mg/mL | INTRAMUSCULAR | Qty: 1

## 2024-11-02 NOTE — Progress Notes (Signed)
 Hospitalist Progress Note      NAME:  Chelsea Schultz   DOB:  12-May-1926  MRM:  [id number]    Date/Time: 11/02/2024  1:41 PM           Assessment / Plan:     89 yo Hx of HTN, DM, interstitial lung dz, dementia, presented w/ AMS, resp failure, hypoxia, pulm fibrosis, enterococcus UTI     Present on Admission:   Severe sepsis (HCC)   Pulmonary fibrosis (HCC)   Acute hypoxemic respiratory failure (HCC)   Delirium   Type 2 diabetes mellitus with diabetic neuropathy (HCC)   HTN (hypertension), benign   History of cerebrovascular accident   Aspiration pneumonia (HCC)   UTI (urinary tract infection)    # Acute hypoxic respiratory failure  # Aspiration pneumonia  # Enterococcus UTI  # Acute metabolic encephalopathy  # Dementia  # Sacral pressure injury  # Oral candidiasis  # Hypercalcemia  # Diabetes mellitus  # Hypotension    After extensive goals of care discussion with family, patient transition to comfort care by palliative team.Appreciate palliative care assistance with this.  On exam, she is oriented only to self.  No new focal deficits.  Delirium precautions using a combination of nonpharmacologic and pharmacologic strategies.  Nonpharmacologic interventions : Minimize nighttime interruptions, try to maintain normal sleep-wake cycle, optimize nutrition, frequently reorient.      Pharmacologic : Diazepam /Ativan  as needed.  Bedside sitter if needed.  Maintain fall precautions.  Diet as tolerated.  Pain control as needed.  Continue IV Benadryl  as needed for itching.    Trial of hydrocortisone  1% topical cream for itching  Case management on board for discharge planning.  Anticipate discharge to SNF with hospice.    #BMI (Calculated): 19.98    I have personally reviewed the radiographs, laboratory data in Epic and decisions and statements above are based partially on this personal interpretation.                 Care Plan discussed with: Patient and Family    Discussed:  Code Status and Care Plan    Prophylaxis:   SCD's    Disposition: hospice at facility           ___________________________________________________    Attending Physician: Debby JINNY Binder, MD        Subjective:     Chief Complaint: Denies any symptoms    ROS:    Patient was seen and examined at bedside.  RN informs patient is now hypotensive, tachycardic with increasing oxygen requirement.  Patient's daughter at bedside.  Daughter reports that patient has been having itching all over the body.  Informed her we will start hydrocortisone  1% cream along with IV Benadryl  as needed for itching.         Objective:       Vitals:          Last 24hrs VS reviewed since prior progress note. Most recent are:    Vitals:    11/02/24 1015   BP: (!) 76/53   Pulse:    Resp:    Temp:    SpO2: (!) 86%     SpO2 Readings from Last 6 Encounters:   11/02/24 (!) 86%   08/19/24 96%   06/26/23 97%   09/05/22 96%   06/19/22 96%   05/11/22 94%          Intake/Output Summary (Last 24 hours) at 11/02/2024 1341  Last data filed at 11/02/2024 0445  Gross per 24 hour   Intake --   Output 700 ml   Net -700 ml            Exam:     Physical Exam:    Gen: Chronically ill-appearing  HEENT: Dry mucous membrane, white plaque seen on buccal mucosa  Neck:  Supple, without masses, thyroid non-tender  Resp:  No accessory muscle use, clear breath sounds without wheezes rales or rhonchi  Card:  No murmurs, normal S1, S2 without thrills, bruits or peripheral edema  Abd:  Soft, non-tender, non-distended, normoactive bowel sounds are present  Musc:  No cyanosis or clubbing  Skin: Sacral pressure injury, minimal loss of skin, no signs of infection  Neuro: No focal deficits, unable to assess sensations, reflexes 2+  Psych: Oriented only to self    Medications Reviewed: (see below)    Lab Data Reviewed: (see below)    ______________________________________________________________________    Medications:     Current Facility-Administered Medications   Medication Dose Route Frequency    hydrocortisone  1 % cream    Topical TID    diphenhydrAMINE  (BENADRYL ) injection 25 mg  25 mg IntraVENous Q8H PRN    famotidine  (PEPCID ) tablet 20 mg  20 mg Oral Daily    HYDROmorphone  HCl PF (DILAUDID ) injection 0.5 mg  0.5 mg IntraVENous Q15 Min PRN    diphenhydrAMINE -zinc  acetate (BENADRYL ) cream   Topical TID PRN    therapeutic (THERA-DERM) hand/body lotion   Topical PRN    acetaminophen  (TYLENOL ) suppository 650 mg  650 mg Rectal Q6H PRN    hydrocortisone  (ANUSOL -HC) 2.5 % rectal cream   Rectal TID    carboxymethylcellulose PF (THERATEARS/REFRESH) 1 % ophthalmic gel 1 drop  1 drop Both Eyes TID    lidocaine  (XYLOCAINE ) 5 % ointment   Topical PRN    glycopyrrolate  (ROBINUL ) IntraVENous 0.2 mg  0.2 mg IntraVENous Q4H PRN    ondansetron  (ZOFRAN ) injection 4 mg  4 mg IntraVENous Q6H PRN    LORazepam  (ATIVAN ) 2 MG/ML concentrated solution 0.5 mg  0.5 mg SubLINGual Q2H PRN    diazePAM  (VALIUM ) injection 2.5 mg  2.5 mg IntraVENous Q30 Min PRN    oxyCODONE  (ROXICODONE ) 5 MG/5ML solution 2.5 mg  2.5 mg Oral Q2H PRN    benzocaine  (ORAJEL) 20 % mucosal gel   Mouth/Throat TID    hydrALAZINE  (APRESOLINE ) injection 10 mg  10 mg IntraVENous Q6H PRN    guaiFENesin  (ROBITUSSIN) 100 MG/5ML liquid 200 mg  200 mg Oral Q4H PRN    ipratropium 0.5 mg-albuterol  2.5 mg (DUONEB ) nebulizer solution 1 Dose  1 Dose Inhalation Q4H PRN    prochlorperazine  (COMPAZINE ) injection 10 mg  10 mg IntraVENous Q6H PRN    sodium chloride  flush 0.9 % injection 5-40 mL  5-40 mL IntraVENous PRN    0.9 % sodium chloride  infusion   IntraVENous PRN    albuterol  (PROVENTIL ) (2.5 MG/3ML) 0.083% nebulizer solution 2.5 mg  2.5 mg Nebulization Q4H PRN            Lab Review:     No results for input(s): WBC, HGB, HCT, PLT in the last 72 hours.    No results for input(s): NA, K, CL, CO2, GLU, BUN, MG, PHOS, ALT, INR in the last 72 hours.    Invalid input(s): CREA, CA, ALB, TBIL, SGOT    No components found for: [facility]

## 2024-11-02 NOTE — Plan of Care (Signed)
 Problem: Skin/Tissue Integrity  Goal: Skin integrity remains intact  Description: 1.  Monitor for areas of redness and/or skin breakdown  2.  Assess vascular access sites hourly  3.  Every 4-6 hours minimum:  Change oxygen saturation probe site  4.  Every 4-6 hours:  If on nasal continuous positive airway pressure, respiratory therapy assess nares and determine need for appliance change or resting period  Outcome: Progressing     Problem: Safety - Adult  Goal: Free from fall injury  Outcome: Progressing     Problem: Chronic Conditions and Co-morbidities  Goal: Patient's chronic conditions and co-morbidity symptoms are monitored and maintained or improved  Outcome: Progressing     Problem: Discharge Planning  Goal: Discharge to home or other facility with appropriate resources  Outcome: Progressing     Problem: Respiratory - Adult  Goal: Achieves optimal ventilation and oxygenation  Outcome: Progressing     Problem: Confusion  Goal: Confusion, delirium, dementia, or psychosis is improved or at baseline  Description: INTERVENTIONS:  1. Assess for possible contributors to thought disturbance, including medications, impaired vision or hearing, underlying metabolic abnormalities, dehydration, psychiatric diagnoses, and notify attending LIP  2. Institute high risk fall precautions, as indicated  3. Provide frequent short contacts to provide reality reorientation, refocusing and direction  4. Decrease environmental stimuli, including noise as appropriate  5. Monitor and intervene to maintain adequate nutrition, hydration, elimination, sleep and activity  6. If unable to ensure safety without constant attention obtain sitter and review sitter guidelines with assigned personnel  7. Initiate Psychosocial CNS and Spiritual Care consult, as indicated  Outcome: Progressing     Problem: Pain  Goal: Verbalizes/displays adequate comfort level or baseline comfort level  Outcome: Progressing     Problem: Neurosensory - Adult  Goal:  Achieves stable or improved neurological status  Outcome: Progressing  Goal: Achieves maximal functionality and self care  Outcome: Progressing     Problem: Cardiovascular - Adult  Goal: Maintains optimal cardiac output and hemodynamic stability  Outcome: Progressing  Goal: Absence of cardiac dysrhythmias or at baseline  Outcome: Progressing     Problem: Skin/Tissue Integrity - Adult  Goal: Skin integrity remains intact  Description: 1.  Monitor for areas of redness and/or skin breakdown  2.  Assess vascular access sites hourly  3.  Every 4-6 hours minimum:  Change oxygen saturation probe site  4.  Every 4-6 hours:  If on nasal continuous positive airway pressure, respiratory therapy assess nares and determine need for appliance change or resting period  Outcome: Progressing  Goal: Incisions, wounds, or drain sites healing without S/S of infection  Outcome: Progressing  Goal: Oral mucous membranes remain intact  Outcome: Progressing     Problem: Musculoskeletal - Adult  Goal: Return mobility to safest level of function  Outcome: Progressing  Goal: Maintain proper alignment of affected body part  Outcome: Progressing  Goal: Return ADL status to a safe level of function  Outcome: Progressing     Problem: Gastrointestinal - Adult  Goal: Minimal or absence of nausea and vomiting  Outcome: Progressing  Goal: Maintains or returns to baseline bowel function  Outcome: Progressing  Goal: Maintains adequate nutritional intake  Outcome: Progressing     Problem: Genitourinary - Adult  Goal: Absence of urinary retention  Outcome: Progressing     Problem: Infection - Adult  Goal: Absence of infection at discharge  Outcome: Progressing  Goal: Absence of infection during hospitalization  Outcome: Progressing     Problem:  Metabolic/Fluid and Electrolytes - Adult  Goal: Electrolytes maintained within normal limits  Outcome: Progressing  Goal: Hemodynamic stability and optimal renal function maintained  Outcome: Progressing  Goal:  Glucose maintained within prescribed range  Outcome: Progressing     Problem: Hematologic - Adult  Goal: Maintains hematologic stability  Outcome: Progressing     Problem: Safety - Medical Restraint  Goal: Remains free of injury from restraints (Restraint for Interference with Medical Device)  Description: INTERVENTIONS:  1. Determine that other, less restrictive measures have been tried or would not be effective before applying the restraint  2. Evaluate the patient's condition at the time of restraint application  3. Inform patient/family regarding the reason for restraint  4. Q2H: Monitor safety, psychosocial status, comfort, nutrition and hydration  Outcome: Progressing     Problem: ABCDS Injury Assessment  Goal: Absence of physical injury  Outcome: Progressing

## 2024-11-02 NOTE — Plan of Care (Signed)
 Problem: Skin/Tissue Integrity  Goal: Skin integrity remains intact  Description: 1.  Monitor for areas of redness and/or skin breakdown  2.  Assess vascular access sites hourly  3.  Every 4-6 hours minimum:  Change oxygen saturation probe site  4.  Every 4-6 hours:  If on nasal continuous positive airway pressure, respiratory therapy assess nares and determine need for appliance change or resting period  11/02/2024 1959 by Kriste Browning, LPN  Outcome: Progressing  11/02/2024 1825 by Isaias Hedge, RN  Outcome: Progressing     Problem: Safety - Adult  Goal: Free from fall injury  11/02/2024 1959 by Kriste Browning, LPN  Outcome: Progressing  11/02/2024 1825 by Isaias Hedge, RN  Outcome: Progressing     Problem: Chronic Conditions and Co-morbidities  Goal: Patient's chronic conditions and co-morbidity symptoms are monitored and maintained or improved  11/02/2024 1959 by Kriste Browning, LPN  Outcome: Progressing  11/02/2024 1825 by Isaias Hedge, RN  Outcome: Progressing     Problem: Discharge Planning  Goal: Discharge to home or other facility with appropriate resources  11/02/2024 1959 by Kriste Browning, LPN  Outcome: Progressing  11/02/2024 1825 by Isaias Hedge, RN  Outcome: Progressing     Problem: Respiratory - Adult  Goal: Achieves optimal ventilation and oxygenation  11/02/2024 1959 by Kriste Browning, LPN  Outcome: Progressing  11/02/2024 1825 by Isaias Hedge, RN  Outcome: Progressing     Problem: Neurosensory - Adult  Goal: Achieves stable or improved neurological status  11/02/2024 1959 by Kriste Browning, LPN  Outcome: Progressing  11/02/2024 1825 by Isaias Hedge, RN  Outcome: Progressing  Goal: Achieves maximal functionality and self care  11/02/2024 1959 by Kriste Browning, LPN  Outcome: Progressing  11/02/2024 1825 by Isaias Hedge, RN  Outcome: Progressing     Problem: Cardiovascular - Adult  Goal: Maintains optimal cardiac output and hemodynamic stability  11/02/2024 1959 by  Kriste Browning, LPN  Outcome: Progressing  11/02/2024 1825 by Isaias Hedge, RN  Outcome: Progressing  Goal: Absence of cardiac dysrhythmias or at baseline  11/02/2024 1959 by Kriste Browning, LPN  Outcome: Progressing  11/02/2024 1825 by Isaias Hedge, RN  Outcome: Progressing     Problem: Skin/Tissue Integrity - Adult  Goal: Skin integrity remains intact  Description: 1.  Monitor for areas of redness and/or skin breakdown  2.  Assess vascular access sites hourly  3.  Every 4-6 hours minimum:  Change oxygen saturation probe site  4.  Every 4-6 hours:  If on nasal continuous positive airway pressure, respiratory therapy assess nares and determine need for appliance change or resting period  11/02/2024 1959 by Kriste Browning, LPN  Outcome: Progressing  11/02/2024 1825 by Isaias Hedge, RN  Outcome: Progressing  Goal: Incisions, wounds, or drain sites healing without S/S of infection  11/02/2024 1959 by Kriste Browning, LPN  Outcome: Progressing  11/02/2024 1825 by Isaias Hedge, RN  Outcome: Progressing  Goal: Oral mucous membranes remain intact  11/02/2024 1959 by Kriste Browning, LPN  Outcome: Progressing  11/02/2024 1825 by Isaias Hedge, RN  Outcome: Progressing     Problem: Musculoskeletal - Adult  Goal: Return mobility to safest level of function  11/02/2024 1959 by Kriste Browning, LPN  Outcome: Progressing  11/02/2024 1825 by Isaias Hedge, RN  Outcome: Progressing  Goal: Maintain proper alignment of affected body part  11/02/2024 1959 by Kriste Browning, LPN  Outcome: Progressing  11/02/2024 1825 by Isaias Hedge, RN  Outcome: Progressing  Goal: Return ADL status to a safe level of  function  11/02/2024 1959 by Kriste Browning, LPN  Outcome: Progressing  11/02/2024 1825 by Isaias Hedge, RN  Outcome: Progressing     Problem: Gastrointestinal - Adult  Goal: Minimal or absence of nausea and vomiting  11/02/2024 1959 by Kriste Browning, LPN  Outcome: Progressing  11/02/2024 1825 by Isaias Hedge, RN  Outcome: Progressing  Goal: Maintains or returns to baseline bowel function  11/02/2024 1959 by Kriste Browning, LPN  Outcome: Progressing  11/02/2024 1825 by Isaias Hedge, RN  Outcome: Progressing  Goal: Maintains adequate nutritional intake  11/02/2024 1959 by Kriste Browning, LPN  Outcome: Progressing  11/02/2024 1825 by Isaias Hedge, RN  Outcome: Progressing     Problem: Genitourinary - Adult  Goal: Absence of urinary retention  11/02/2024 1959 by Kriste Browning, LPN  Outcome: Progressing  11/02/2024 1825 by Isaias Hedge, RN  Outcome: Progressing     Problem: Infection - Adult  Goal: Absence of infection at discharge  11/02/2024 1959 by Kriste Browning, LPN  Outcome: Progressing  11/02/2024 1825 by Isaias Hedge, RN  Outcome: Progressing  Goal: Absence of infection during hospitalization  11/02/2024 1959 by Kriste Browning, LPN  Outcome: Progressing  11/02/2024 1825 by Isaias Hedge, RN  Outcome: Progressing     Problem: Metabolic/Fluid and Electrolytes - Adult  Goal: Electrolytes maintained within normal limits  11/02/2024 1959 by Kriste Browning, LPN  Outcome: Progressing  11/02/2024 1825 by Isaias Hedge, RN  Outcome: Progressing  Goal: Hemodynamic stability and optimal renal function maintained  11/02/2024 1959 by Kriste Browning, LPN  Outcome: Progressing  11/02/2024 1825 by Isaias Hedge, RN  Outcome: Progressing  Goal: Glucose maintained within prescribed range  11/02/2024 1959 by Kriste Browning, LPN  Outcome: Progressing  11/02/2024 1825 by Isaias Hedge, RN  Outcome: Progressing     Problem: Hematologic - Adult  Goal: Maintains hematologic stability  11/02/2024 1959 by Kriste Browning, LPN  Outcome: Progressing  11/02/2024 1825 by Isaias Hedge, RN  Outcome: Progressing     Problem: Confusion  Goal: Confusion, delirium, dementia, or psychosis is improved or at baseline  Description: INTERVENTIONS:  1. Assess for possible contributors to thought disturbance, including  medications, impaired vision or hearing, underlying metabolic abnormalities, dehydration, psychiatric diagnoses, and notify attending LIP  2. Institute high risk fall precautions, as indicated  3. Provide frequent short contacts to provide reality reorientation, refocusing and direction  4. Decrease environmental stimuli, including noise as appropriate  5. Monitor and intervene to maintain adequate nutrition, hydration, elimination, sleep and activity  6. If unable to ensure safety without constant attention obtain sitter and review sitter guidelines with assigned personnel  7. Initiate Psychosocial CNS and Spiritual Care consult, as indicated  11/02/2024 1959 by Kriste Browning, LPN  Outcome: Progressing  11/02/2024 1825 by Isaias Hedge, RN  Outcome: Progressing     Problem: Pain  Goal: Verbalizes/displays adequate comfort level or baseline comfort level  11/02/2024 1959 by Kriste Browning, LPN  Outcome: Progressing  11/02/2024 1825 by Isaias Hedge, RN  Outcome: Progressing     Problem: Safety - Medical Restraint  Goal: Remains free of injury from restraints (Restraint for Interference with Medical Device)  Description: INTERVENTIONS:  1. Determine that other, less restrictive measures have been tried or would not be effective before applying the restraint  2. Evaluate the patient's condition at the time of restraint application  3. Inform patient/family regarding the reason for restraint  4. Q2H: Monitor safety, psychosocial status, comfort, nutrition and hydration  11/02/2024 1959 by Kriste Browning, LPN  Outcome: Progressing  11/02/2024 1825 by Isaias Hedge, RN  Outcome: Progressing     Problem: ABCDS Injury Assessment  Goal: Absence of physical injury  11/02/2024 1959 by Kriste Browning, LPN  Outcome: Progressing  11/02/2024 1825 by Isaias Hedge, RN  Outcome: Progressing

## 2024-11-03 MED FILL — DIAZEPAM 5 MG/ML IJ SOLN: 5 mg/mL | INTRAMUSCULAR | Qty: 2

## 2024-11-03 MED FILL — HYDROMORPHONE HCL 1 MG/ML IJ SOLN: 1 mg/mL | INTRAMUSCULAR | Qty: 1

## 2024-11-03 MED FILL — REFRESH CELLUVISC 1 % OP GEL: 1 % | OPHTHALMIC | Qty: 1

## 2024-11-03 NOTE — Progress Notes (Signed)
 SPIRITUAL HEALTH  End of Life/Bereavement Care/Death Note                  Room # 420/01    Name: Chelsea Schultz           Age: 89 y.o.    Gender: female          MRN: [id number]  Religion: Non-denominational       Preferred Language: English    Date: November 22, 2024  Visit Time: Begin Time: 1415 End Time : 1442   Complexity of Encounter: Low      Visit Summary: Chaplain responded to referral for death of Clover Feehan. Daughters [name] and Nathanel were present at bedside; no other family expected at this time. Family described Rudi as full of strength in how she lived her life. Chaplain provided calming presence, active listening, spiritual and/or emotional support, prayer. No other needs expressed at this time. Family informed Chaplain Odena will be taken to [place]  for burial. No funeral home has been identified at this time. Chaplain available for follow up as needed/desired by family.    Referral/Consult From: Nurse  Encounter Overview/Reason: Family Care  Encounter Code:     Crisis (if applicable): Type: Family Care  Service Provided For: Family     Patient was available.    Faith, Belief, Meaning:   Patient unable to assess at this time  Family/Friends identifies as spiritual  have beliefs or practices that help with coping during difficult times  faith/ spirituality is a source of strength  Rituals (if applicable) Type:  (None)    Importance and Influence:  Patient unable to assess at this time  Family/Friends has spiritual/personal beliefs that influence decisions regarding the patient's health    Community:  Patient   Support System Includes   Children  Extended family  Family/Friends   indicated that they feel well-supported  Support System Includes   Children  Extended family    Assessment and Plan of Care:   Emotions Expressed by Patient:   Assessment: Unable to assess    Interventions by Chaplain:   Intervention: Sustaining Presence/Ministry of presence     Result/ Response by Patient:    Outcome: Did not respond    Patient Plan of Care:   Plan and Referrals  Plan/Referrals: Other (Comment) (Spiritual Health Services available upon request.)     Emotions Expressed by Spouse/Family/Friends:   calm  grieving  sad  tearful  thankful    Chaplain Interventions with Spouse/ Family/Friends include:   active listening  facilitated expression of thoughts and feelings  affirmed coping skills/ support systems  provided ministry of presence  provided bereavement/ grief care    Spouse/Family/Friends Plan of Care:   Spiritual care available upon referral.      Electronically signed by    Elia Mems H. Johnny, M.Div. on 11-22-2024 at 2:51 PM.   Spiritual Health   [facility]  Page a chaplain [phone]- PRAY [phone])

## 2024-11-03 NOTE — Progress Notes (Signed)
 Physician Progress Note      PATIENT:               Chelsea Schultz, Chelsea Schultz  CSN #:                  [id number]  DOB:                       05/17/1926  ADMIT DATE:       10/10/2024 9:49 PM  DISCH DATE:        2024/11/29 2:10 PM  RESPONDING  PROVIDER #:        Debby JINNY Binder MD          QUERY TEXT:    Please clarify the patient?s nutritional status:    The clinical indicators include:  89 y/o female to ED CC weakness and fatigue. Per MD she was treated with   antibiotics for aspiration pneumonia.  She was placed on supplemental oxygen   for hypoxia.    DC summary  Admission Diagnoses: Hypoxemia  Severe sepsis  Community acquired pneumonia, unspecified laterality  Aspiration pneumonia      BMI 19.98  Albumin 2.8/2.8/2.2    10/20/24 modified bariums IMPRESSION:  With all consistencies there is normal  oral processing and initiation of the swallow and a normal pharyngeal   stripping  wave with good clearing of the bolus through the pharynx. There is no  nasopharyngeal reflux, penetration, aspiration or significant retention in the  vallecula or piriform sinuses. Mass effect on the dorsal cervical esophagus   due  to cervical spondylitic changes>    1/9 RD Consult:  RD consulted to discuss diet texture preferences with pt/family who notes   pureed foods seem to be too thick, however they do not wish for a FLD. Will   continues purees and ask for sides of gravy or milk to be sent which eat meal   for thinning. Will also allow oatmeal. Diet updated.    10/20/24 Speech PN  Patient seen for swallow re-evaluation after a decline in function over the   weekend where nursing made patient NPO.  PLAN :  Recommendations and Planned Interventions:  Diet: NPO  --meds via IV route  --may have ice chips/sips of water  after oral care  --strict oral care 2-3x day  --will plan to complete swallow imaging via MBS as able  Recommend next SLP session: MBS  Options provided:  -- Underweight with BMI 80601  -- Cachexia  -- Other - I will  add my own diagnosis  -- Disagree - Not applicable / Not valid  -- Refer to Clinical Documentation Reviewer    PROVIDER RESPONSE TEXT:    This patient is cachectic.    Query created by: Eugene Modest on 11/17/2024 10:39 AM      Electronically signed by:  Debby JINNY Binder MD 11/21/2024 7:08 PM

## 2024-11-03 NOTE — Discharge Summary (Signed)
 Hospitalist Discharge Summary     Patient ID:  Chelsea Schultz  [id number]  89 y.o.  [birth year]    Admit date: 10/10/2024    Discharge date and time: Nov 17, 2024    Admission Diagnoses: Hypoxemia [R09.02]  Severe sepsis (HCC) [A41.9, R65.20]  Community acquired pneumonia, unspecified laterality [J18.9]    Discharge Diagnoses:    Present on Admission:   Severe sepsis (HCC)   Pulmonary fibrosis (HCC)   Acute hypoxemic respiratory failure (HCC)   Delirium   Type 2 diabetes mellitus with diabetic neuropathy (HCC)   HTN (hypertension), benign   History of cerebrovascular accident   Aspiration pneumonia (HCC)   UTI (urinary tract infection)         Hospital Course:     89 year old female with chronic constitutional lung disease, recurrent urinary tract infections, OAB, hyperlipidemia, type 2 diabetes, and hypertension presented to the ED with generalized weakness and fatigue ongoing all day, patient is a limited historian.  HPI obtained from ER records.  According to ER records generalized weakness was accompanied by a wet cough persisting for one week.  Daughter at the time reported patient had sick contacts at home, as her home caretakers had similar respiratory symptoms.     Initial ED evaluation revealed tachycardia (heart rate 105 bpm), slight tachypnea, hypoxia with O? saturation 88% on 2 L nasal cannula, and a temperature of 100F. Laboratory studies showed glucose 179 mg/dL, calcium  10.9 mg/dL, albumin 2.8 g/dL, WBC 85.1 89/O, and BNP 60,028 pg/mL. Influenza and COVID screens were negative. Chest X-ray demonstrated diffuse bilateral interstitial opacities concerning for prominent interstitial edema and chronic lung disease. The Emergency department physician requested admission for severe sepsis with acute hypoxic respiratory failure.    Upon admission, she was treated with antibiotics for aspiration pneumonia.  She was placed on supplemental oxygen for hypoxia.  Patient with advanced age and multiple  comorbidities.  Her clinical condition continued to deteriorate during hospital stay despite maximal medical therapy.  She was evaluated by palliative care.  ACP/goals of care discussion with patient's family.  She was eventually transitioned to comfort care.  She was treated with diazepam /Ativan  as needed.  She was treated with pain medications as per pain scale.  She was started on diet as tolerated.  Hospice services were offered.  Patient was planning to discharge to SNF with hospice.  On 11/02/2024, patient was noted to have increasing oxygen requirements and was found to be hypotensive.  Clinical status continued to deteriorate and she expired the following day , 11/17/24.    Imaging  No results found.     PCP: Gonzella Lorane MATSU, MD     Consults: Palliative care    Condition of patient at discharge: Expired    Discharge Exam:    Physical Exam:      General: pale; motionless; unresponsive  Eyes: pupils fixed, dilated  Heart: heart sounds absent; carotid and peripheral pulses absent  Lungs: no breath sounds; no respiratory effort  Neuro: unresponsive to sternal rub        Disposition: Expired    Patient Instructions:      Medication List        CONTINUE taking these medications      Diabetic Shoe Misc  by Does not apply route            STOP taking these medications      acetaminophen  650 MG extended release tablet  Commonly known as: TYLENOL      amLODIPine  10 MG  tablet  Commonly known as: NORVASC      ascorbic acid 1000 MG tablet  Commonly known as: VITAMIN C     aspirin 81 MG EC tablet     atorvastatin  10 MG tablet  Commonly known as: LIPITOR      carvedilol  3.125 MG tablet  Commonly known as: COREG      diclofenac sodium 1 % Gel  Commonly known as: VOLTAREN     docusate 100 MG Caps  Commonly known as: COLACE, DULCOLAX     Estradiol 10 MCG Tabs vaginal tablet  Commonly known as: VAGIFEM     famotidine  40 MG tablet  Commonly known as: PEPCID      losartan  100 MG tablet  Commonly known as: COZAAR      METAMUCIL  FIBER PO     metFORMIN  500 MG tablet  Commonly known as: GLUCOPHAGE      mirabegron  50 MG Tb24  Commonly known as: Myrbetriq      polyethylene glycol 17 GM/SCOOP powder  Commonly known as: GLYCOLAX      pregabalin  100 MG capsule  Commonly known as: LYRICA      trospium  20 MG tablet  Commonly known as: SANCTURA      Vagisil Lubricant Gel     vitamin B-12 1000 MCG tablet  Commonly known as: CYANOCOBALAMIN     VITAMIN D -3 PO            Current Discharge Medication List        CONTINUE these medications which have NOT CHANGED    Details   Diabetic Shoe MISC by Does not apply route  Qty: 1 each, Refills: 0    Associated Diagnoses: Type 2 diabetes mellitus with diabetic nephropathy, without long-term current use of insulin  (HCC); Debility                Approximate time spent in patient care on day of discharge: 45 mins    Signed:  Debby JINNY Binder, MD  12-03-2024  3:11 PM

## 2024-11-03 NOTE — Care Coordination (Signed)
 Care Management Progress Note    Reason for Admission:   Hypoxemia [R09.02]  Severe sepsis (HCC) [A41.9, R65.20]  Community acquired pneumonia, unspecified laterality [J18.9]     Updates at 1440:    Patient has expired.        Patient Admission Status: Inpatient  RUR: 16%  Hospitalization in the last 30 days (Readmission):  No        Transition of care plan:  Ongoing medical management.  Hypotensive.  Started on IVF.  On 4L NC.  Palliative following.      Discharge Plan:  CM met with patient and patient's daughter, [name] at bedside.  CM also called and spoke to patient's other daughter, [name] [phone].  CM has called and communicated with [name] twice today to discuss discharge planning updates.      A.  LTC Placement with Hospice Care.    Anticipate [facility]--pending confirmed acceptance.  Heather, SNF Liaison has been communicating with patient's daughter, [name].  Per Powell, she is going to contact the daughter again to see if she is okay with doing 7 days of comfort care & private pay, while waiting for Medicaid to be approved.  Patient will have a spend-down.      B.  Hospice Agency:  Pending at this time.  As daughter, [name] stated that they have not chosen an agency at this time.        Discharge plan communicated with patient and/or discharge caregiver: Yes      Date last IMM letter given: 10/30/2024    Outpatient follow-up:  TBD    Transport at discharge: TBD    Will continue to follow.    ______________________________________  Creighton Mulling, BSN, RN-CM  Shelvy Gwenn Thresa Bernardino- Care Management  Available via [facility]  2024/11/21  2:44 PM

## 2024-11-23 NOTE — Death Notes (Signed)
 Death Pronouncement Note  Patient's Name: Chelsea Schultz   Patient's Date of Birth: [birth year]  MRN Number: [id number]    Admitting Provider: Ethel LOISE Forward, DO  Attending Provider: Iris Debby PARAS, MD    Patient was examined and the following were absent: Pulses, Blood Pressure, and Respiratory effort    I declared the patient dead on 2024-11-25 at 2:10 PM    Preliminary Cause of Death:      Electronically signed by Debby PARAS Iris, MD on 11-25-24 at 3:08 PM EST

## 2024-11-23 DEATH — deceased
